# Patient Record
Sex: Male | Born: 1960 | Race: White | Hispanic: No | Marital: Married | Smoking: Never smoker
Health system: Southern US, Community
[De-identification: ages and names within clinical notes are randomized; demographics above are authoritative.]

## PROBLEM LIST (undated history)

## (undated) DIAGNOSIS — K279 Peptic ulcer, site unspecified, unspecified as acute or chronic, without hemorrhage or perforation: Secondary | ICD-10-CM

## (undated) DIAGNOSIS — I2510 Atherosclerotic heart disease of native coronary artery without angina pectoris: Secondary | ICD-10-CM

## (undated) DIAGNOSIS — K219 Gastro-esophageal reflux disease without esophagitis: Secondary | ICD-10-CM

## (undated) DIAGNOSIS — G459 Transient cerebral ischemic attack, unspecified: Secondary | ICD-10-CM

## (undated) DIAGNOSIS — F32A Depression, unspecified: Secondary | ICD-10-CM

## (undated) DIAGNOSIS — F329 Major depressive disorder, single episode, unspecified: Secondary | ICD-10-CM

## (undated) DIAGNOSIS — R7989 Other specified abnormal findings of blood chemistry: Secondary | ICD-10-CM

## (undated) DIAGNOSIS — T7840XA Allergy, unspecified, initial encounter: Secondary | ICD-10-CM

## (undated) DIAGNOSIS — I10 Essential (primary) hypertension: Secondary | ICD-10-CM

## (undated) DIAGNOSIS — Z8719 Personal history of other diseases of the digestive system: Secondary | ICD-10-CM

## (undated) DIAGNOSIS — N200 Calculus of kidney: Secondary | ICD-10-CM

## (undated) DIAGNOSIS — E785 Hyperlipidemia, unspecified: Secondary | ICD-10-CM

## (undated) HISTORY — DX: Allergy, unspecified, initial encounter: T78.40XA

## (undated) HISTORY — DX: Other specified abnormal findings of blood chemistry: R79.89

## (undated) HISTORY — DX: Gastro-esophageal reflux disease without esophagitis: K21.9

## (undated) HISTORY — DX: Atherosclerotic heart disease of native coronary artery without angina pectoris: I25.10

## (undated) HISTORY — DX: Peptic ulcer, site unspecified, unspecified as acute or chronic, without hemorrhage or perforation: K27.9

## (undated) HISTORY — DX: Essential (primary) hypertension: I10

## (undated) HISTORY — PX: WRIST SURGERY: SHX841

## (undated) HISTORY — DX: Personal history of other diseases of the digestive system: Z87.19

## (undated) HISTORY — DX: Depression, unspecified: F32.A

## (undated) HISTORY — DX: Major depressive disorder, single episode, unspecified: F32.9

## (undated) HISTORY — DX: Hyperlipidemia, unspecified: E78.5

## (undated) HISTORY — PX: CHOLECYSTECTOMY: SHX55

## (undated) HISTORY — DX: Calculus of kidney: N20.0

## (undated) HISTORY — DX: Transient cerebral ischemic attack, unspecified: G45.9

## (undated) HISTORY — PX: TONSILLECTOMY AND ADENOIDECTOMY: SUR1326

## (undated) HISTORY — PX: CARDIAC CATHETERIZATION: SHX172

---

## 2006-11-08 ENCOUNTER — Encounter: Admission: RE | Admit: 2006-11-08 | Discharge: 2006-11-08 | Payer: Self-pay | Admitting: Gastroenterology

## 2006-11-08 IMAGING — CT CT ENTERO ABD W/CM
2 of 4 series · 13 of 32 positions shown, 19 images · IV contrast (VOLUMEN & [ID] OMNI 300)
Comparison: None.

CLINICAL DATA: Abdominal pain.  History of small bowel obstruction. 
CT ENTEROGRAPHY OF THE ABDOMEN WITH CONTRAST:
TECHNIQUE: Multidetector CT imaging of the abdomen and pelvis was performed following administration of [2T] cc of VoLumen low attenuation enteric contrast.  Images were acquired after bolus injection of nonionic intravenous contrast with coronal and sagittal reformations reviewed for assessment of the small bowel mucosa. 
Contrast:  100 cc Omnipaque 300

[Series 2: enterography study · axial · 0.78mm/px · z∈[-393,-48]mm · 9 of 120 slices shown, 15 images]
[im 12/120  soft-tissue]
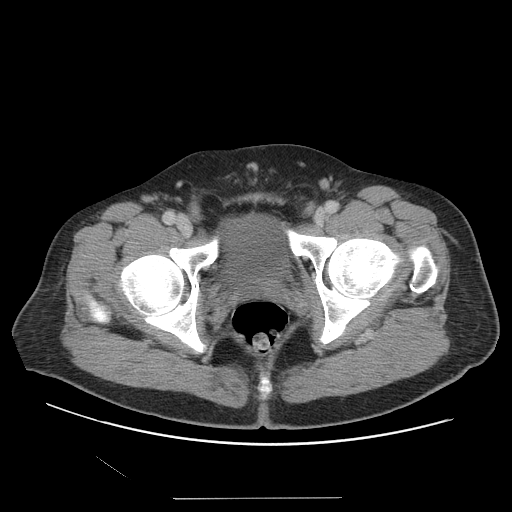
[im 12/120  bone]
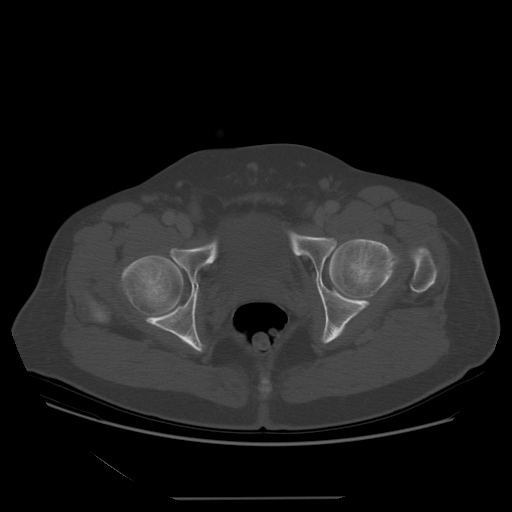
[im 24/120  soft-tissue]
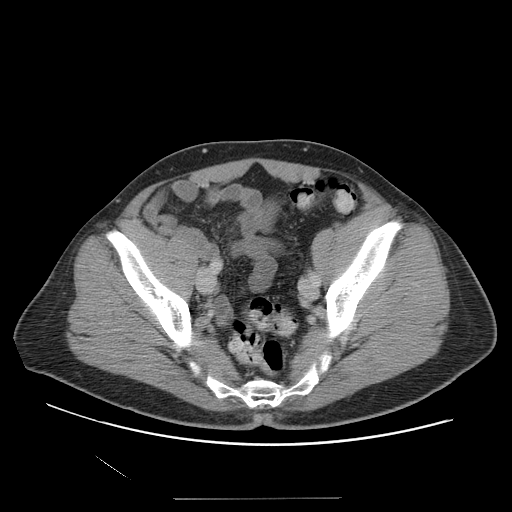
[im 36/120  soft-tissue]
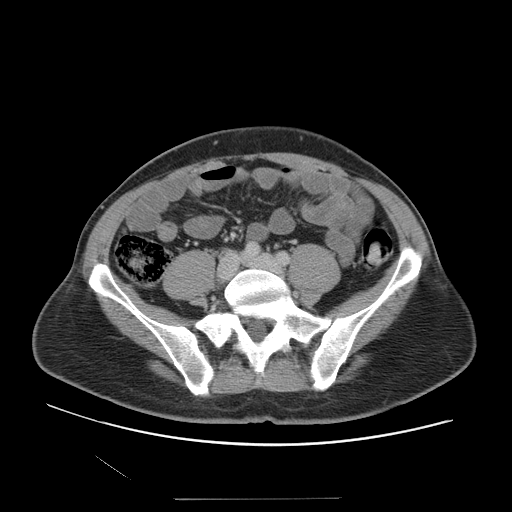
[im 48/120  soft-tissue]
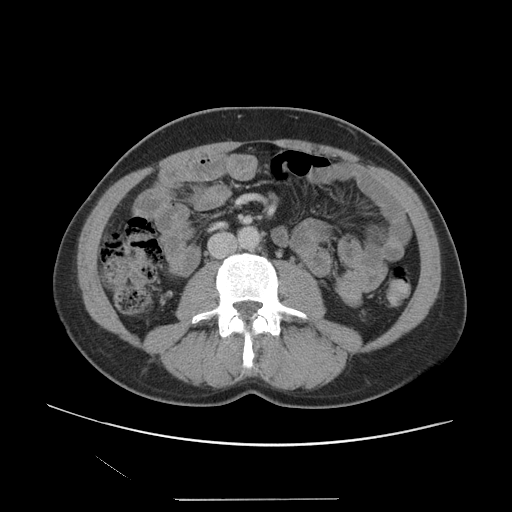
[im 60/120  soft-tissue]
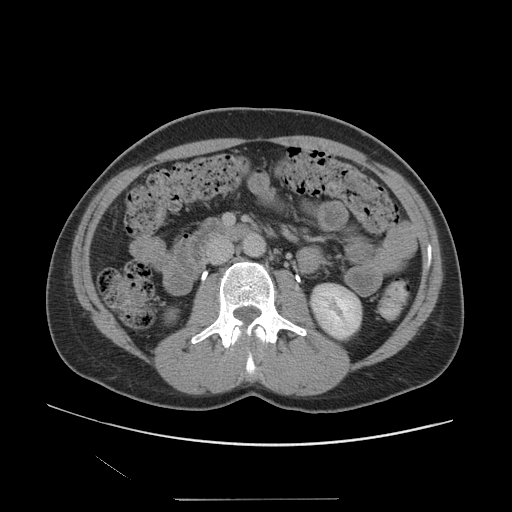
[im 72/120  soft-tissue]
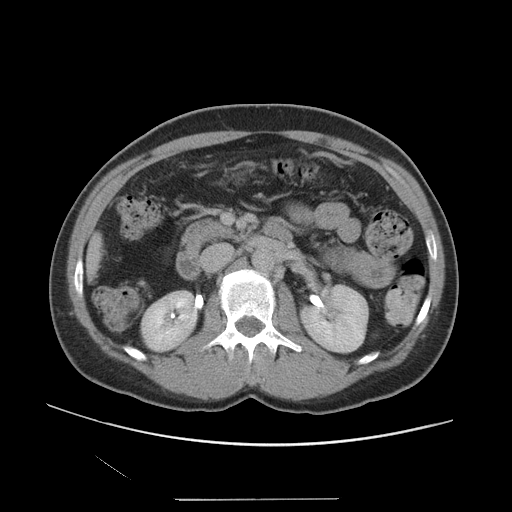
[im 72/120  lung]
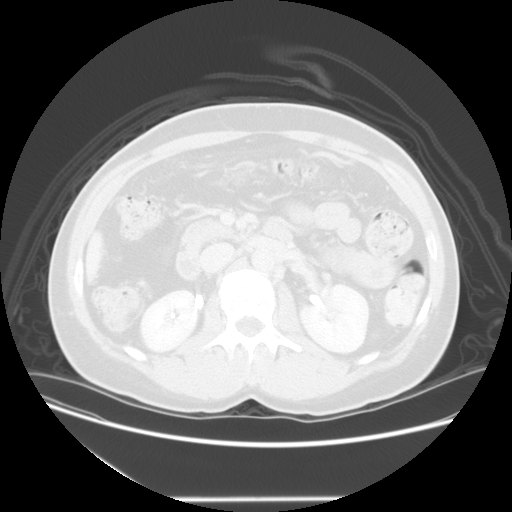
[im 84/120  soft-tissue]
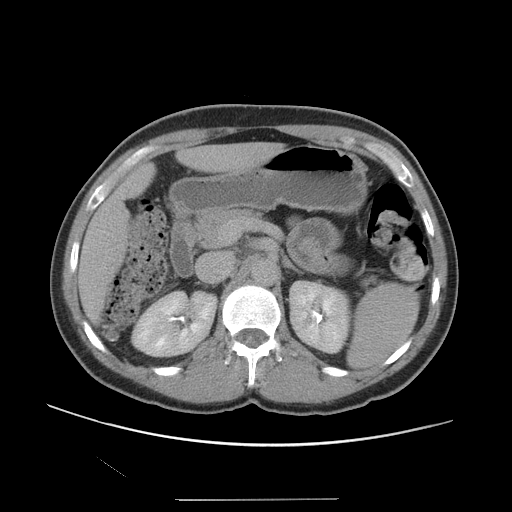
[im 84/120  lung]
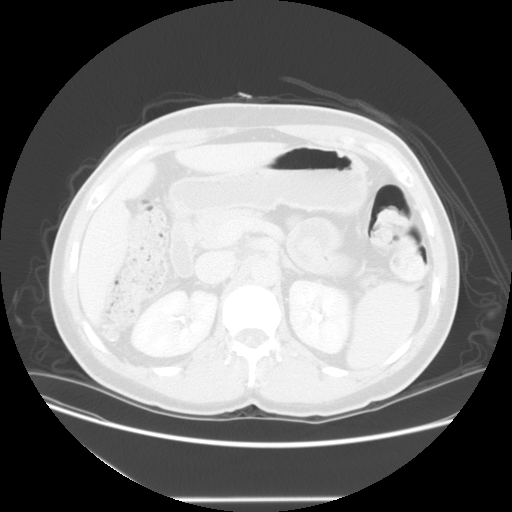
[im 96/120  soft-tissue]
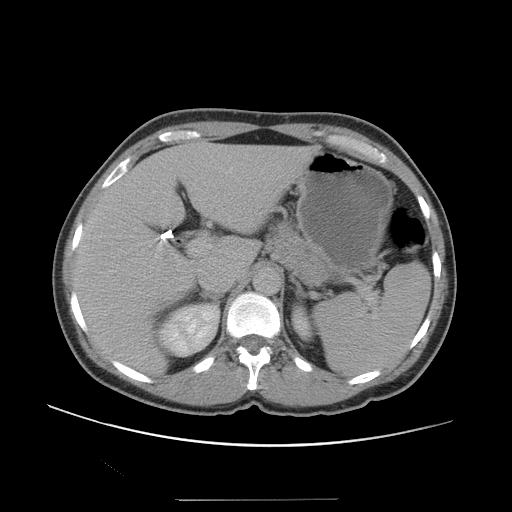
[im 96/120  lung]
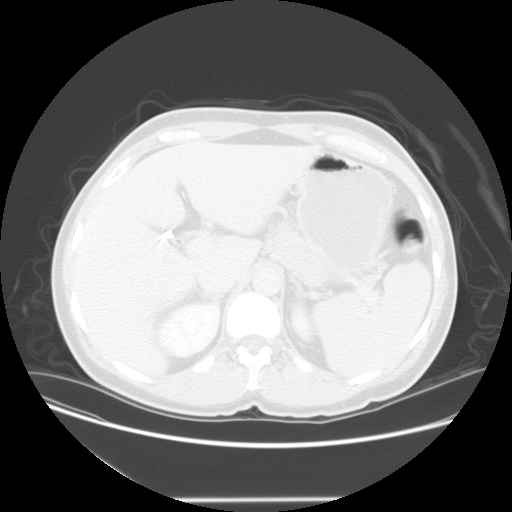
[im 108/120  soft-tissue]
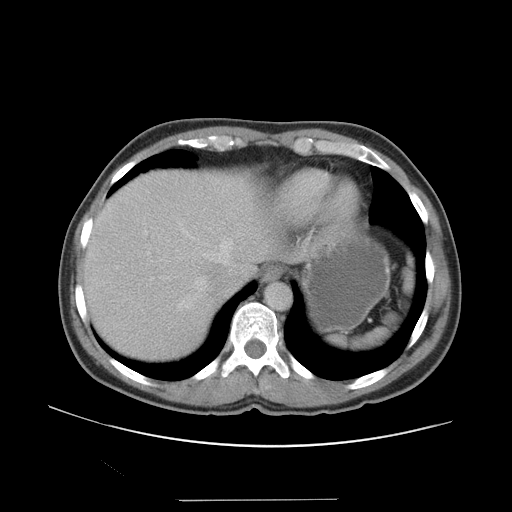
[im 108/120  lung]
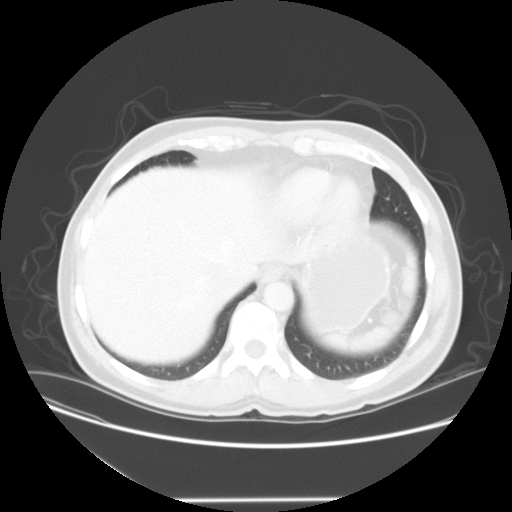
[im 108/120  bone]
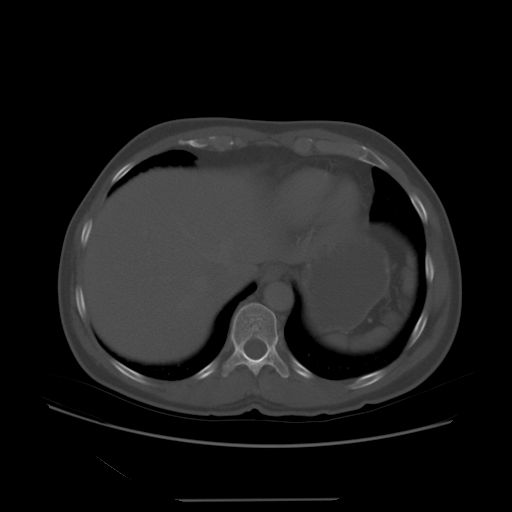

[Series 401: sagittal · sagittal · 0.94mm/px · 4 of 124 slices shown]
[im 13/124  soft-tissue]
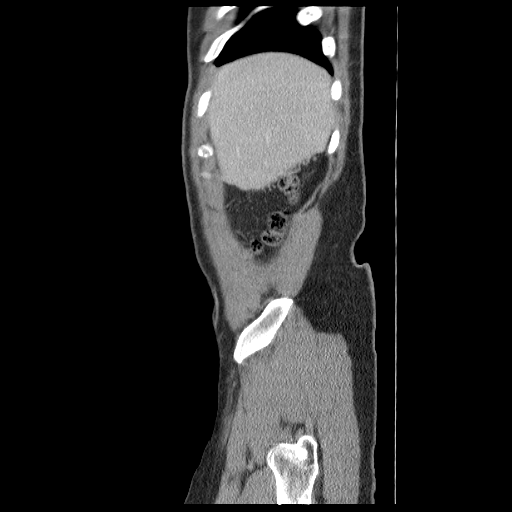
[im 25/124  soft-tissue]
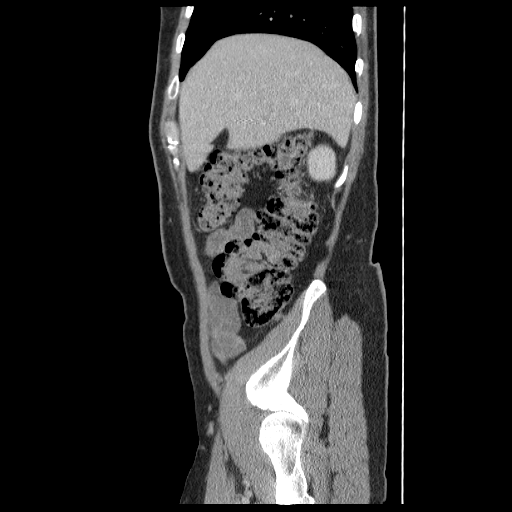
[im 37/124  soft-tissue]
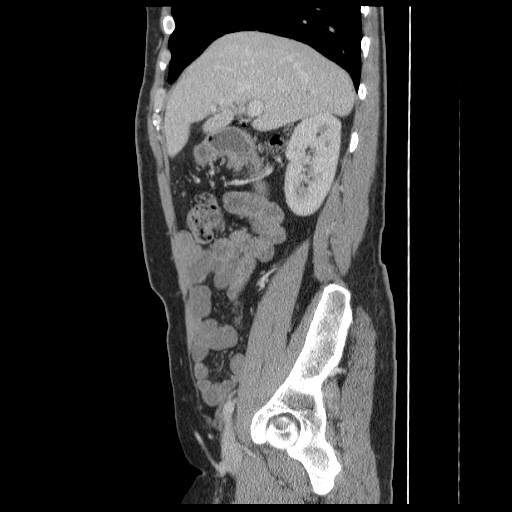
[im 50/124  soft-tissue]
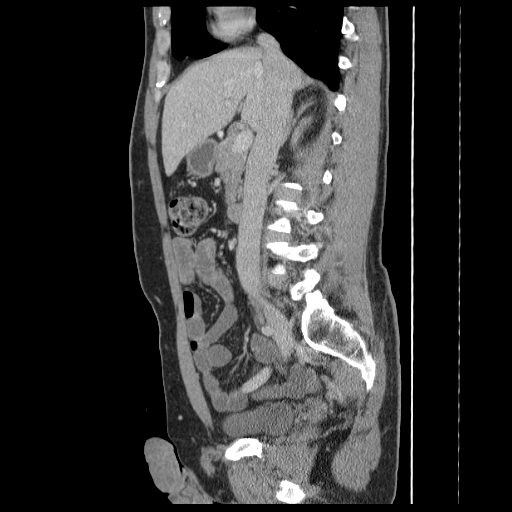

[13 of 32 positions shown; findings below may reference images not displayed]

FINDINGS: The lung bases are clear.    There is no pleural effusion.   The patient is status post cholecystectomy.  There is a small amount of air in the common bile duct, presumably secondary to sphincterotomy.  The liver, spleen, pancreas, adrenal glands and kidneys appear normal.  
The stomach and small bowel are fluid-filled, but only mildly distended.  No bowel lesions are identified.  There is no evidence of bowel obstruction.  No extraluminal fluid collections are seen.  The vascular structures enhance normally.  There is no lymphadenopathy.
IMPRESSION: 1.  Negative CT enterography study.  No evidence of small bowel obstruction. 
2.  Pneumobilia status post cholecystectomy, presumably secondary to previous sphincterotomy.
CT ENTEROGRAPHY OF THE PELVIS WITH CONTRAST:
FINDINGS: No pelvic mass, fluid collection, or inflammatory process is demonstrated.  No bowel abnormalities are identified within the pelvis.  There is stool throughout the colon.
IMPRESSION: No evidence of bowel wall thickening or inflammatory change.

## 2007-01-08 HISTORY — PX: ESOPHAGOGASTRODUODENOSCOPY: SHX1529

## 2012-02-19 DIAGNOSIS — I2510 Atherosclerotic heart disease of native coronary artery without angina pectoris: Secondary | ICD-10-CM

## 2012-02-19 HISTORY — PX: CORONARY ARTERY BYPASS GRAFT: SHX141

## 2012-09-10 HISTORY — PX: COLONOSCOPY: SHX5424

## 2012-09-10 LAB — HM COLONOSCOPY

## 2014-11-09 ENCOUNTER — Ambulatory Visit (INDEPENDENT_AMBULATORY_CARE_PROVIDER_SITE_OTHER): Payer: BLUE CROSS/BLUE SHIELD | Admitting: Cardiology

## 2014-11-09 ENCOUNTER — Encounter: Payer: Self-pay | Admitting: Cardiology

## 2014-11-09 ENCOUNTER — Other Ambulatory Visit: Payer: Self-pay | Admitting: *Deleted

## 2014-11-09 VITALS — BP 128/90 | HR 93 | Ht 70.0 in | Wt 193.0 lb

## 2014-11-09 DIAGNOSIS — I255 Ischemic cardiomyopathy: Secondary | ICD-10-CM

## 2014-11-09 MED ORDER — METOPROLOL SUCCINATE ER 25 MG PO TB24: 25.0000 mg | ORAL_TABLET | Freq: Every day | ORAL | Status: DC

## 2014-11-09 MED ORDER — METOPROLOL SUCCINATE ER 25 MG PO TB24
25.0000 mg | ORAL_TABLET | Freq: Every day | ORAL | Status: DC
Start: 2014-11-09 — End: 2014-11-09

## 2014-11-09 NOTE — Progress Notes (Signed)
HPI The patient presents as a new patient for follow up of CAD.  He has had CABG as below.  He presented with chest pain initially in 2012.  He did not ever have an MI.  Since CABG he has done well.  The patient denies any new symptoms such as chest discomfort, neck or arm discomfort. There has been no new shortness of breath, PND or orthopnea. There have been no reported palpitations, presyncope or syncope.  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  He denies any cardiovascular symptoms with this.   Allergies not on file  Current Outpatient Prescriptions  Medication Sig Dispense Refill  . levofloxacin (LEVAQUIN) 250 MG tablet Take 250 mg by mouth daily.    . tamsulosin (FLOMAX) 0.4 MG CAPS capsule Take 0.4 mg by mouth.    Marland Kitchen amLODipine (NORVASC) 10 MG tablet Take 10 mg by mouth daily.  2  . benazepril (LOTENSIN) 20 MG tablet Take 20 mg by mouth daily.  2  . CRESTOR 10 MG tablet   0  . VICODIN ES 7.5-300 MG TABS   0  . ZENATANE 20 MG capsule   0   No current facility-administered medications for this visit.    Past Medical History  Diagnosis Date  . CAD (coronary artery disease)     Cath 2013 LAD 95% stenosis, D1 90% stenosis, RCA 20%, Circ 30%.  Previous stent to D1 2012.   EF 60%.    Marland Kitchen HTN (hypertension)   . Hyperlipidemia   . Depression   . Low testosterone   . Nephrolithiasis     Past Surgical History  Procedure Laterality Date  . Coronary artery bypass graft  02/19/12    L - LAD, SVG - D1, SVG - OM  . Tonsillectomy and adenoidectomy    . Wrist surgery    . Cholecystectomy      Family History  Problem Relation Age of Onset  . CAD Father 62  . Aneurysm Mother 53  . Heart disease Sister     Unclear    History   Social History  . Marital Status: Married    Spouse Name: N/A    Number of Children: N/A  . Years of Education: N/A   Occupational History  . Not on file.   Social History Main Topics    . Smoking status: Never Smoker   . Smokeless tobacco: Not on file  . Alcohol Use: Not on file  . Drug Use: Not on file  . Sexual Activity: Not on file   Other Topics Concern  . 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      ROS:  As stated in the HPI and negative for all other systems.  PHYSICAL EXAM BP 128/90 mmHg  Pulse 93  Ht  (1.778 m)  Wt 193 lb (87.544 kg)  BMI 27.69 kg/m2  GENERAL:  Well appearing HEENT:  Pupils equal round and reactive, fundi not visualized, oral mucosa unremarkable NECK:  No jugular venous distention, waveform within normal limits, carotid upstroke brisk and symmetric, no bruits, no thyromegaly LYMPHATICS:  No cervical, inguinal adenopathy LUNGS:  Clear to auscultation bilaterally BACK:  No CVA tenderness CHEST:  Well healed sternotomy scar. HEART:  PMI not displaced or sustained,S1 and S2 fixed and split, no S3, no  S4, no clicks, no rubs, no murmurs ABD:  Flat, positive bowel sounds normal in frequency in pitch, no bruits, no rebound, no guarding, no midline pulsatile mass, no hepatomegaly, no splenomegaly EXT:  2 plus pulses throughout, no edema, no cyanosis no clubbing SKIN:  No rashes no nodules NEURO:  Cranial nerves II through XII grossly intact, motor grossly intact throughout PSYCH:  Cognitively intact, oriented to person place and time   EKG:  EKG, RATE 93, axis WNL, intervals WNL, RBBB, no acute ST T wave changes.    ASSESSMENT AND PLAN  CAD:  I would like to start a low dose of beta blocker.  He might have had some intolerance to this in the past but he is willing to try again.  Otherwise he will continue with risk reduction.  He will start to exercise more.  I told his wife to increase to Toprol to 50 mg in about one month if he is tolerating this.   DYSLIPIDEMIA:  His lipids are  LDL 62, HDL 37.  He will continue the meds as listed  HTN:  This is being  controlled on the meds as listed.  I will wean down the Norvasc if his BP allows.

## 2014-11-09 NOTE — Patient Instructions (Signed)
Your physician recommends that you schedule a follow-up appointment in: 3 months with Dr. Antoine PocheHochrein  WE are starting Toprol XL 25 mg one daily

## 2015-02-08 ENCOUNTER — Ambulatory Visit (INDEPENDENT_AMBULATORY_CARE_PROVIDER_SITE_OTHER): Payer: BLUE CROSS/BLUE SHIELD | Admitting: Cardiology

## 2015-02-08 ENCOUNTER — Encounter: Payer: Self-pay | Admitting: Cardiology

## 2015-02-08 VITALS — BP 132/96 | HR 96 | Ht 70.0 in | Wt 200.7 lb

## 2015-02-08 DIAGNOSIS — I2510 Atherosclerotic heart disease of native coronary artery without angina pectoris: Secondary | ICD-10-CM | POA: Diagnosis not present

## 2015-02-08 NOTE — Progress Notes (Signed)
HPI The patient presents for follow up of CAD.  He has had CABG as below.  He presented with chest pain initially in 2012.  He did not ever have an MI.  Since CABG he has done well.  This is his second visit.  The patient denies any new symptoms such as chest discomfort, neck or arm discomfort. There has been no new shortness of breath, PND or orthopnea. There have been no reported palpitations, presyncope or syncope.  He is not exercising as much as he should.  He did go on a cruise recently and gained weight.  Of note he did not do well with up titration of his beta blocker to 100 mg. He also had his Norvasc increased. Because of headaches he was started back on a higher dose of Norvasc and his beta blocker was reduced. His diastolic blood pressure has been slightly elevated but he's also gained 5 pounds.   Allergies  Allergen Reactions  . Penicillins Rash    Current Outpatient Prescriptions  Medication Sig Dispense Refill  . amLODipine (NORVASC) 10 MG tablet Take 10 mg by mouth daily.  2  . aspirin 81 MG tablet Take 81 mg by mouth daily.    . benazepril (LOTENSIN) 40 MG tablet Take 40 mg by mouth daily.    . cloNIDine (CATAPRES) 0.1 MG tablet Take 0.1 mg by mouth as needed.  1  . CRESTOR 10 MG tablet   0  . DULoxetine (CYMBALTA) 60 MG capsule Take 60 mg by mouth daily.    Marland Kitchen. LORazepam (ATIVAN) 1 MG tablet Take 1 mg by mouth every 8 (eight) hours.    . metoprolol succinate (TOPROL-XL) 50 MG 24 hr tablet Take 50 mg by mouth daily. Take with or immediately following a meal.    . nitroGLYCERIN (NITROSTAT) 0.4 MG SL tablet Place 0.4 mg under the tongue every 5 (five) minutes as needed for chest pain.    Marland Kitchen. testosterone cypionate (DEPOTESTOTERONE CYPIONATE) 200 MG/ML injection every 14 (fourteen) days.  2  . topiramate (TOPAMAX) 50 MG tablet Take 50 mg by mouth 2 (two) times daily.     No current facility-administered medications for this visit.    Past Medical History  Diagnosis Date  .  CAD (coronary artery disease)     Cath 2013 LAD 95% stenosis, D1 90% stenosis, RCA 20%, Circ 30%.  Previous stent to D1 2012.   EF 60%.    Marland Kitchen. HTN (hypertension)   . Hyperlipidemia   . Depression   . Low testosterone   . Nephrolithiasis   . PUD (peptic ulcer disease)     Past Surgical History  Procedure Laterality Date  . Coronary artery bypass graft  02/19/12    L - LAD, SVG - D1, SVG - OM  . Tonsillectomy and adenoidectomy    . Wrist surgery    . Cholecystectomy      Family History  Problem Relation Age of Onset  . CAD Father 1450  . Aneurysm Mother [id number]  . Heart disease Sister     Unclear    History   Social History  . Marital Status: Married    Spouse Name: N/A  . Number of Children: N/A  . Years of Education: N/A   Occupational History  . Not on file.   Social History Main Topics  . Smoking status: Never Smoker   . Smokeless tobacco: Not on file  . Alcohol Use: Not on file  . Drug Use: Not on  file  . Sexual Activity: Not on file   Other Topics Concern  . 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      ROS:  As stated in the HPI and negative for all other systems.  PHYSICAL EXAM BP 132/96 mmHg  Pulse 96  Ht  (1.778 m)  Wt 200 lb 11.2 oz (91.037 kg)  BMI 28.80 kg/m2  GENERAL:  Well appearing HEENT:  Pupils equal round and reactive, fundi not visualized, oral mucosa unremarkable NECK:  No jugular venous distention, waveform within normal limits, carotid upstroke brisk and symmetric, no bruits, no thyromegaly LYMPHATICS:  No cervical, inguinal adenopathy LUNGS:  Clear to auscultation bilaterally BACK:  No CVA tenderness CHEST:  Well healed sternotomy scar. HEART:  PMI not displaced or sustained,S1 and S2 fixed and split, no S3, no S4, no clicks, no rubs, no murmurs ABD:  Flat, positive bowel sounds normal in frequency in pitch, no bruits, no rebound, no guarding, no midline  pulsatile mass, no hepatomegaly, no splenomegaly EXT:  2 plus pulses throughout, no edema, no cyanosis no clubbing SKIN:  No rashes no nodules NEURO:  Cranial nerves II through XII grossly intact, motor grossly intact throughout PSYCH:  Cognitively intact, oriented to person place and time   EKG:  EKG, RATE 68, axis WNL, intervals WNL, RBBB, no acute ST T wave changes.    02/09/2015   ASSESSMENT AND PLAN  CAD:  He seems to be tolerating low-dose beta blocker. His meds are otherwise adjusted. No change in therapy is indicated. We did discuss exercise as this is not yet at target.  DYSLIPIDEMIA:  His lipids have been at target and he will continue the meds as listed.  HTN:  He is on a reasonable regimen at this point.  He will continue with these meds as they were recently adjusted.

## 2015-02-08 NOTE — Patient Instructions (Signed)
Dr Hochrein wants you to follow-up in 6 months. You will receive a reminder letter in the mail two months in advance. If you don't receive a letter, please call our office to schedule the follow-up appointment. 

## 2016-05-22 DIAGNOSIS — E7800 Pure hypercholesterolemia, unspecified: Secondary | ICD-10-CM | POA: Insufficient documentation

## 2016-05-22 DIAGNOSIS — E291 Testicular hypofunction: Secondary | ICD-10-CM | POA: Insufficient documentation

## 2016-05-22 DIAGNOSIS — I25709 Atherosclerosis of coronary artery bypass graft(s), unspecified, with unspecified angina pectoris: Secondary | ICD-10-CM

## 2016-05-22 DIAGNOSIS — F23 Brief psychotic disorder: Secondary | ICD-10-CM | POA: Insufficient documentation

## 2016-05-22 DIAGNOSIS — Z0000 Encounter for general adult medical examination without abnormal findings: Secondary | ICD-10-CM | POA: Insufficient documentation

## 2016-05-22 DIAGNOSIS — I10 Essential (primary) hypertension: Secondary | ICD-10-CM

## 2016-05-22 DIAGNOSIS — F411 Generalized anxiety disorder: Secondary | ICD-10-CM

## 2016-05-22 DIAGNOSIS — I2510 Atherosclerotic heart disease of native coronary artery without angina pectoris: Secondary | ICD-10-CM | POA: Insufficient documentation

## 2016-05-22 DIAGNOSIS — Z125 Encounter for screening for malignant neoplasm of prostate: Secondary | ICD-10-CM

## 2016-05-22 HISTORY — DX: Encounter for screening for malignant neoplasm of prostate: Z12.5

## 2016-05-22 HISTORY — DX: Generalized anxiety disorder: F41.1

## 2016-05-22 HISTORY — DX: Atherosclerosis of coronary artery bypass graft(s), unspecified, with unspecified angina pectoris: I25.709

## 2016-05-22 HISTORY — DX: Pure hypercholesterolemia, unspecified: E78.00

## 2016-05-22 HISTORY — DX: Brief psychotic disorder: F23

## 2016-05-22 HISTORY — DX: Testicular hypofunction: E29.1

## 2016-05-22 HISTORY — DX: Essential (primary) hypertension: I10

## 2016-09-11 ENCOUNTER — Encounter: Payer: Self-pay | Admitting: Neurology

## 2016-09-11 ENCOUNTER — Ambulatory Visit (INDEPENDENT_AMBULATORY_CARE_PROVIDER_SITE_OTHER): Payer: BLUE CROSS/BLUE SHIELD | Admitting: Neurology

## 2016-09-11 VITALS — BP 119/81 | HR 70 | Resp 20 | Ht 70.0 in | Wt 221.0 lb

## 2016-09-11 DIAGNOSIS — R0683 Snoring: Secondary | ICD-10-CM | POA: Diagnosis not present

## 2016-09-11 DIAGNOSIS — G4761 Periodic limb movement disorder: Secondary | ICD-10-CM | POA: Diagnosis not present

## 2016-09-11 DIAGNOSIS — G4733 Obstructive sleep apnea (adult) (pediatric): Secondary | ICD-10-CM | POA: Insufficient documentation

## 2016-09-11 DIAGNOSIS — I2510 Atherosclerotic heart disease of native coronary artery without angina pectoris: Secondary | ICD-10-CM | POA: Diagnosis not present

## 2016-09-11 DIAGNOSIS — G4751 Confusional arousals: Secondary | ICD-10-CM | POA: Insufficient documentation

## 2016-09-11 DIAGNOSIS — F329 Major depressive disorder, single episode, unspecified: Secondary | ICD-10-CM | POA: Insufficient documentation

## 2016-09-11 DIAGNOSIS — E66812 Obesity, class 2: Secondary | ICD-10-CM

## 2016-09-11 DIAGNOSIS — E6609 Other obesity due to excess calories: Secondary | ICD-10-CM

## 2016-09-11 DIAGNOSIS — F332 Major depressive disorder, recurrent severe without psychotic features: Secondary | ICD-10-CM | POA: Diagnosis not present

## 2016-09-11 DIAGNOSIS — G4719 Other hypersomnia: Secondary | ICD-10-CM | POA: Diagnosis not present

## 2016-09-11 HISTORY — DX: Confusional arousals: G47.51

## 2016-09-11 HISTORY — DX: Obstructive sleep apnea (adult) (pediatric): G47.33

## 2016-09-11 HISTORY — DX: Major depressive disorder, recurrent severe without psychotic features: F33.2

## 2016-09-11 HISTORY — DX: Snoring: R06.83

## 2016-09-11 HISTORY — DX: Periodic limb movement disorder: G47.61

## 2016-09-11 HISTORY — DX: Other hypersomnia: G47.19

## 2016-09-11 HISTORY — DX: Obesity, class 2: E66.812

## 2016-09-11 HISTORY — DX: Other obesity due to excess calories: E66.09

## 2016-09-11 HISTORY — DX: Major depressive disorder, single episode, unspecified: F32.9

## 2016-09-11 NOTE — Progress Notes (Signed)
SLEEP MED[facility]INE CLINIC   Provider:  Larey Seat, M D  Referring Provider: Dan Humphreys, NP Primary Care Physician:  Chong Sicilian  Chief Complaint  Patient presents with  . New Patient (Initial Visit)    snores, never had sleep study    HPI:  Jon Gonzales is a 55 y.o. male , seen here as a referral from NP. 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  Jon Gonzales is a documented history of hypertension, is taking regular medication and was able to control this condition, hyperlipidemia, hypercholesterolemia, had a recurrent episode of major depression in the past, has some residual anxiety, his diagnosis was coronary artery disease but has no history of myocardial infarction, his last stress test was in 2014. He has been supplemented for testosterone and vit d deficiency, complains of fatigue, insomnia and restless legs. He has had bouts of insomnia in the past and recurrent insomnia earlier this year was seen last on 06/20/2016 by Darrol Jump and addressed this problem.  Chief complaint according to patient : " wife reports that I snore over the last 6 month, I am more hoarse and congested "  " I am a mouth breather"   Sleep habits are as follows: Jon Gonzales reports that he goes to bed very early. He actually struggles to stay awake in the evening hours and he feels excessive fatigue after a day of work. He will usually is and that already between 8 and 9 PM and goes to sleep. He prefers a prone sleep position. The bedroom is cool, quiet and dark. He is using a white noise machine for background. While he can initiate sleep easy, he reports trouble to stay asleep.  His wife has noted him to snore. He usually wakes up within 2 hours, and has great difficulties after interruption of sleep to reinitiate sleep. He will have usually a second break between 3 and 4 AM with the same problem. He wakes because he has the urge to  urinate 2-3 times at night. He has attributed this to a prostate enlargement.  The couple usually rises at 7 AM and by this time he may have gotten 7 hours of sleep, but spent about 11 hours in the bedroom. He does not feel rested and refreshed or restored in the morning and is hard for him to start the day. He relies on an alarm does not wake up spontaneously and often hits the snooze button. 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 He dropped the 57 year old step-daughter at school in the morning. He drinks a can of Diet Coke in the morning. His office has access to natural daylight, has a window. He feels usually very tired and sleepy after lunch, no matter how extensive the meal is.    Sleep medical history and family sleep history:  The patient is status post cholecystectomy and tonsillectomy and adenoidectomy and young adulthood, coronary bypass surgery in May 2013, history of peptic ulcer disease diagnosed in spring 2008 by  EGD, currently controlled.  Social history: Jon Gonzales is married with 2 stepchildren 73 and 31 years of age. He is a nonsmoker, he drinks caffeine in form of soda one can in the morning, no further beverages in the afternoon. He drinks in social situations alcohol, but not regular and not daily. 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  Review of Systems: Out of a complete 14 system review, the patient complains of only the following symptoms, and all other reviewed systems are negative. The patient endorsed daytime sleepiness snoring at night, depression, feeling nervous and anxious especially when having trouble to go back to sleep, had in the past sometimes suicidal thoughts after a relapse into major depression. Headaches, environmental allergy, weight gain. EDS.   Epworth score 19 , Fatigue severity score 62 , depression score 7 /15.  How likely are you to doze in the following situations: 0 = not  likely, 1 = slight chance, 2 = moderate chance, 3 = high chance  Sitting and Reading? 3 Watching Television?3 Sitting inactive in a public place (theater or meeting)?3 Lying down in the afternoon when circumstances permit?2 Sitting and talking to someone?3 Sitting quietly after lunch without alcohol?1 In a car, while stopped for a few minutes in traffic?3 As a passenger in a car for an hour without a break?1  Total = 19    Have opportunity to review the patient's last lab results:  CBC with differential obtained on 07/12/2016, lipid panel same day revealing a mild elevation in triglycerides, testosterone was 159 ng and is considered low, TSH was 2.98 in normal limits, liver function tests were not elevated, prostate-specific antigen 1.3 normal lead level II mcg/dL normal this was from August 2017 Social History   Social History  . Marital status: Married    Spouse name: N/A  . Number of children: N/A  . Years of education: N/A   Occupational History  . Not on file.   Social History Main Topics  . Smoking status: Never Smoker  . Smokeless tobacco: Not on file  . Alcohol use Not on file  . Drug use: Unknown  . Sexual activity: Not on file   Other Topics Concern  . 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      Family History  Problem Relation Age of Onset  . CAD Father 50  . Aneurysm Mother 46  . Heart disease Sister     Unclear    Past Medical History:  Diagnosis Date  . CAD (coronary artery disease)    Cath 2013 LAD 95% stenosis, D1 90% stenosis, RCA 20%, Circ 30%.  Previous stent to D1 2012.   EF 60%.    . Depression   . HTN (hypertension)   . Hyperlipidemia   . Low testosterone   . Nephrolithiasis   . PUD (peptic ulcer disease)     Past Surgical History:  Procedure Laterality Date  . CHOLECYSTECTOMY    . CORONARY ARTERY BYPASS GRAFT  02/19/12   L - LAD, SVG - D1, SVG - OM  .  TONSILLECTOMY AND ADENOIDECTOMY    . WRIST SURGERY      Current Outpatient Prescriptions  Medication Sig Dispense Refill  . amLODipine (NORVASC) 10 MG tablet Take 10 mg by mouth daily.  2  . ARIPiprazole (ABILIFY) 10 MG tablet Take 10 mg by mouth daily.  0  . aspirin 81 MG tablet Take 81 mg by mouth daily.    . benazepril (LOTENSIN) 40 MG tablet Take 40 mg by mouth daily.    . cloNIDine (CATAPRES) 0.1 MG tablet Take 0.1 mg by mouth as needed.  1  . CRESTOR 10 MG tablet   0  . fexofenadine (ALLEGRA) 30 MG tablet   Take 30 mg by mouth daily as needed.    . KRILL OIL PO Take by mouth.    . LORazepam (ATIVAN) 1 MG tablet Take 1 mg by mouth every 8 (eight) hours.    . metoprolol succinate (TOPROL-XL) 50 MG 24 hr tablet Take 50 mg by mouth daily. Take with or immediately following a meal.    . Multiple Vitamin (MULTI-VITAMINS) TABS Take by mouth.    . nitroGLYCERIN (NITROSTAT) 0.4 MG SL tablet Place 0.4 mg under the tongue every 5 (five) minutes as needed for chest pain.    . testosterone cypionate (DEPOTESTOTERONE CYPIONATE) 200 MG/ML injection every 14 (fourteen) days.  2  . topiramate (TOPAMAX) 50 MG tablet Take 50 mg by mouth 2 (two) times daily.     No current facility-administered medications for this visit.     Allergies as of 09/11/2016 - Review Complete 09/11/2016  Allergen Reaction Noted  . Penicillins Rash 11/09/2014    Vitals: BP 119/81   Pulse 70   Resp 20   Ht 5' 10" (1.778 m)   Wt 221 lb (100.2 kg)   BMI 31.71 kg/m  Last Weight:  Wt Readings from Last 1 Encounters:  09/11/16 221 lb (100.2 kg)   BMI:Body mass index is 31.71 kg/m.     Last Height:   Ht Readings from Last 1 Encounters:  09/11/16 5' 10" (1.778 m)    Physical exam:  General: The patient is awake, alert and appears not in acute distress. The patient is well groomed. Head: Normocephalic, atraumatic. Neck is supple. Mallampati 2-3  neck circumference:17. Nasal airflow congested , TMJ not evident .  Retrognathia is seen, crowded lower jaw, overbite. .  Cardiovascular:  Regular rate and rhythm , without  murmurs or carotid bruit, and without distended neck veins. Respiratory: Lungs are clear to auscultation. Skin:  Without evidence of edema, or rash Trunk: BMI is 32. The patient's posture is erect   Neurologic exam : The patient is awake and alert, oriented to place and time.   Memory subjective described as intact.   Attention span & concentration ability appears normal.  Speech is fluent,  without dysarthria, has dysphonia not aphasia.  Mood and affect are appropriate.  Cranial nerves: Pupils are equal and briskly reactive to light. Funduscopic exam without evidence of pallor or edema.  Extraocular movements  in vertical and horizontal planes intact and without nystagmus. Visual fields by finger perimetry are intact. Hearing to finger rub intact.  Facial sensation intact to fine touch. Facial motor strength is symmetric and tongue and uvula move midline. Shoulder shrug was symmetrical.   Motor exam:  Normal tone, muscle bulk and symmetric strength in all extremities.  Sensory:  Fine touch, pinprick and vibration were tested in all extremities.  Proprioception tested in the upper extremities was normal.  Coordination:  Finger-to-nose maneuver  normal without evidence of ataxia, dysmetria or tremor.  Gait and station: Patient walks without assistive device and is able unassisted to climb up to the exam table. Strength within normal limits.  Stance is stable and normal.Tandem gait is unfragmented.  Deep tendon reflexes: in the  upper and lower extremities are symmetric and intact. Babinski maneuver response is downgoing.  The patient was advised of the nature of the diagnosed sleep disorder , the treatment options and risks for general a health and wellness arising from not treating the condition.  I spent more than 45 minutes of face to face time with the patient. Greater than 50%    of time was spent in counseling and coordination of care. We have discussed the diagnosis and differential and I answered the patient's questions.     Assessment:  After physical and neurologic examination, review of laboratory studies,  Personal review of imaging studies, reports of other /same  Imaging studies ,  Results of polysomnography/ neurophysiology testing and pre-existing records as far as provided in visit., my assessment is   1) Patient presents with snoring, has had witnessed apnea, and presents with risk factors of elevated body mass index, nasal congestion, retrognathia, neck circumference. Is a history of coronary artery disease at a rather young age, it is at a higher risk for developing atrial fibrillation should he have untreated sleep apnea. He probably suffers from seasonal allergies, he uses a nasal rinse almost daily. The witnessed snoring in spite of his tendency to sleep prone is an indicator that he is at higher risk of sleep apnea.  2) Mr. Yoon definitely endorses a very high degree of fatigue and an Epworth sleepiness score that is frequently associated with narcolepsy. He reports an irresistible urge to want to go to sleep after lunchtime and in the afternoon hours. He takes power naps and they feel refreshing he often feels more refreshed after a night of sleep. His sleep is fragmented as described above. For this reason I will not just evaluate him for apnea but also for possible narcolepsy. He does not recall having vivid dreams but she is on medication that could suppress REM sleep stages  3) major depression, testosterone deficiency, can all contribute to a higher degree of fatigue. He has received supplemental testosterone but it has not lifted his fatigue.   4) the patient is on medication that will alter his sleep architecture including a benzodiazepine, Abilify, and antidepressants.  Plan:  Treatment plan and additional workup : Attended sleep study, split if AHI  exceeds 15, 3% desaturation of oxygen. If apnea is not identified but hypoxemia I would like to titrate the patient to oxygen.  If no organic sleep disorder is identified, I would consider working him up for narcolepsy. Based on the antidepressants he is currently taking he would not have a valid MS LT, but we could consider an HLA narcolepsy test. I will meet with him after his sleep study, we will try to get the sleep study done before year's end.      MD  09/11/2016   CC: Leslie C Gonzales [address] [place], [place] [address]          

## 2016-09-24 ENCOUNTER — Telehealth: Payer: Self-pay

## 2016-09-24 NOTE — Telephone Encounter (Signed)
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 Patient voiced understanding and he has been reminded of up coming sleep study.

## 2016-10-16 ENCOUNTER — Ambulatory Visit (INDEPENDENT_AMBULATORY_CARE_PROVIDER_SITE_OTHER): Payer: BLUE CROSS/BLUE SHIELD | Admitting: Neurology

## 2016-10-16 DIAGNOSIS — G4751 Confusional arousals: Secondary | ICD-10-CM

## 2016-10-16 DIAGNOSIS — G4719 Other hypersomnia: Secondary | ICD-10-CM

## 2016-10-16 DIAGNOSIS — E6609 Other obesity due to excess calories: Secondary | ICD-10-CM

## 2016-10-16 DIAGNOSIS — G4733 Obstructive sleep apnea (adult) (pediatric): Secondary | ICD-10-CM | POA: Diagnosis not present

## 2016-10-16 DIAGNOSIS — R0683 Snoring: Secondary | ICD-10-CM

## 2016-10-16 DIAGNOSIS — G4761 Periodic limb movement disorder: Secondary | ICD-10-CM

## 2016-10-16 DIAGNOSIS — I2510 Atherosclerotic heart disease of native coronary artery without angina pectoris: Secondary | ICD-10-CM

## 2016-10-16 DIAGNOSIS — F332 Major depressive disorder, recurrent severe without psychotic features: Secondary | ICD-10-CM

## 2016-10-26 ENCOUNTER — Telehealth: Payer: Self-pay | Admitting: Neurology

## 2016-10-26 DIAGNOSIS — G4710 Hypersomnia, unspecified: Secondary | ICD-10-CM

## 2016-10-26 DIAGNOSIS — G4719 Other hypersomnia: Secondary | ICD-10-CM

## 2016-10-26 DIAGNOSIS — G4730 Sleep apnea, unspecified: Secondary | ICD-10-CM

## 2016-10-26 DIAGNOSIS — G4733 Obstructive sleep apnea (adult) (pediatric): Secondary | ICD-10-CM

## 2016-10-26 NOTE — Procedures (Signed)
PATIENT'S NAMJacklynn Gonzales:  Homan, Warnell D. DOB:      [birth year]      MR#:    [id number]     DATE OF RECORDING: 10/16/2016 REFERRING M.D.:  Lauris PoagLeslie O'Neal, NP Study Performed:   Baseline Polysomnogram HISTORY:  Jon GanongWilliam D Rostron is a 56 y.o. male, seen here as a referral from Mickey FarberKristen Kocak, MD  for a sleep consultation. The patient is excessively daytime sleepy. Hx of CAD, ulcer, Nocturia 2-3 times at night.   The patient endorsed the Epworth Sleepiness Scale at 19- points.  FSS at 62 points and Depression score at 7/15 points.  The patient's weight 221 pounds with a height of 70 (inches), resulting in a BMI of 31.71 kg/m2. The patient's neck circumference measured 17 inches.  CURRENT MEDICATIONS: Norvasc, Abilify, Lotensin, Catapres, Crestor, Allegra, Krill Oil, Ativan, Toprol XL, Multi, Nitrostat, Testosterone injection, Topamax   PROCEDURE:  This is a multichannel digital polysomnogram utilizing the Somnostar 11.2 system.  Electrodes and sensors were applied and monitored per AASM Specifications.   EEG, EOG, Chin and Limb EMG, were sampled at 200 Hz.  ECG, Snore and Nasal Pressure, Thermal Airflow, Respiratory Effort, CPAP Flow and Pressure, Oximetry was sampled at 50 Hz. Digital video and audio were recorded.      BASELINE STUDY: Lights Out was at 20:52 and Lights On at 05:32.  Total recording time (TRT) was 520.5 minutes, with a total sleep time (TST) of 468 minutes.  The patient's sleep latency was 19 minutes.  REM latency was 104 minutes.  The sleep efficiency was 89.9 %.     SLEEP ARCHITECTURE: WASO (Wake after sleep onset) was 33.5 minutes.  There were 14 minutes in Stage N1, 284.5 minutes Stage N2, 88.5 minutes Stage N3 and 81 minutes in Stage REM.  The percentage of Stage N1 was 3.%, Stage N2 was 60.8%, Stage N3 was 18.9% and Stage R (REM sleep) was 17.3%.   RESPIRATORY ANALYSIS:  There were a total of 52 respiratory events:  8 obstructive apneas, 44 hypopneas with 0 respiratory event related arousals  (RERAs).     The total APNEA/HYPOPNEA INDEX (AHI) was 6.7/hour and the total RESPIRATORY DISTURBANCE INDEX was 6.7 /hour.  16 events occurred in REM sleep and 62 events in NREM. The REM AHI was 11.9 /hour, versus a non-REM AHI of 5.6. The patient spent 107.5 minutes of total sleep time in the supine position and 361 minutes in non-supine. The supine AHI was 16.7 versus a non-supine AHI of 3.7.  OXYGEN SATURATION & C02:  The Wake baseline 02 saturation was 94%, with the lowest being 82%. Time spent below 89% saturation equaled 7 minutes.   PERIODIC LIMB MOVEMENTS:   The patient had a total of 5 Periodic Limb Movements.  The Periodic Limb Movement (PLM) index was 0.6 and the PLM Arousal index was 0.0/hour.   IMPRESSION:  1. Mild Obstructive Sleep Apnea(OSA) REM sleep and supine sleep position dependent. 2. Primary Snoring, moderate 3. No parasomnia activity.   RECOMMENDATIONS:  Given the high degree of daytime sleepiness and fatigue, I would consider Mr. Sedalia MutaCox a candidate for a CPAP therapy. If his sleepiness is not improved after 30-60 days of CPAP treatment, an evaluation for narcolepsy will follow. I will order a CPAP titration study.   1. Avoid supine sleep if possible, consider using a tennis ball.  2. Avoid sedative-hypnotics which may worsen sleep apnea, alcohol and tobacco (as applicable). 3. Advise to lose weight, diet and exercise if not contraindicated (BMI  32). 4. Dental device or ENT procedures can improve mild apnea and snoring- as clinically indicated . 5. Further information regarding OSA may be obtained from [employer] (www.sleepfoundation.org) or American Sleep Apnea Association (www.sleepapnea.org). 6. A follow up appointment will be scheduled in the Sleep Clinic at [facility]. The referring provider will be notified of the results.      I certify that I have reviewed the entire raw data recording prior to the issuance of this report in  accordance with the Standards of Accreditation of the American Academy of Sleep Medicine (AASM)      Melvyn Novas, MD  10-26-2016  Diplomat, American Board of Psychiatry and Neurology  Diplomat, American Board of Sleep Medicine Medical Director, [facility] [employer]

## 2016-10-26 NOTE — Telephone Encounter (Signed)
IMPRESSION:  1. Mild Obstructive Sleep Apnea(OSA) AHI 6.7/hr. , REM sleep ( AHI 11.9) and supine sleep position ( AHI 16.7) dependent. 2. Primary Snoring, moderate 3. No parasomnia activity.   RECOMMENDATIONS:  Given the high degree of daytime sleepiness and fatigue, I would consider Jon Gonzales a candidate for a CPAP therapy. If his sleepiness is not improved after 30-60 days of CPAP treatment, an evaluation for narcolepsy will follow. I will order a CPAP titration study.   1. Avoid supine sleep if possible, consider using a tennis ball.  2. Avoid sedative-hypnotics which may worsen sleep apnea, alcohol and tobacco (as applicable). 3. Advise to lose weight, diet and exercise if not contraindicated (BMI 32). 4. Dental device or ENT procedures can improve mild apnea and snoring- as clinically indicated . 5. Further information regarding OSA may be obtained from [employer] (www.sleepfoundation.org) or American Sleep Apnea Association (www.sleepapnea.org). 6. A follow up appointment will be scheduled in [facility]. The referring provider will be notified of the results.

## 2016-10-29 NOTE — Telephone Encounter (Signed)
-----   Message from Melvyn Novasarmen Dohmeier, MD sent at 10/26/2016  1:25 PM EST ----- IMPRESSION:  1. Mild Obstructive Sleep Apnea(OSA) AHI 6.7/hr. , REM sleep ( AHI 11.9) and supine sleep position ( AHI 16.7) dependent. 2. Primary Snoring, moderate 3. No parasomnia activity.   RECOMMENDATIONS:  Given the high degree of daytime sleepiness and fatigue, I would consider Jon Gonzales a candidate for a CPAP therapy. If his sleepiness is not improved after 30-60 days of CPAP treatment, an evaluation for narcolepsy will follow. I will order a CPAP titration study.   1. Avoid supine sleep if possible, consider using a tennis ball.  2. Avoid sedative-hypnotics which may worsen sleep apnea, alcohol and tobacco (as applicable). 3. Advise to lose weight, diet and exercise if not contraindicated (BMI 32). 4. Dental device or ENT procedures can improve mild apnea and snoring- as clinically indicated . 5. Further information regarding OSA may be obtained from [employer] (www.sleepfoundation.org) or American Sleep Apnea Association (www.sleepapnea.org). 6. A follow up appointment will be scheduled in the [facility] Clinic at [facility]. The referring provider will be notified of the results.

## 2016-10-29 NOTE — Telephone Encounter (Signed)
I called pt. I advised her that his sleep study revealed mild osa with primary snoring. Given his high degree of daytime sleepiness and fatigue, Dr. Vickey Hugerohmeier considers pt a candidate for cpap therapy. If pt's sleepiness is not improved after 30-60 days of cpap, Dr .Vickey Hugerohmeier can consider further narcolepsy treatment. Dr. Vickey Hugerohmeier recommends that pt return for a cpap titration. I advised pt to avoid supine sleep. I advised pt to avoid sedative-hypnotics which may worsen sleep apnea, alcohol and tobacco. I advised pt to lose weight, diet, and exercise if not contraindicated by his other physicians. I advised him that a dental device or ENT procedures can improve mild apnea and snoring as clinically indicated. Pt is agreeable to a cpap titration. Pt verbalized understanding of results. Pt had no questions at this time but was encouraged to call back if questions arise.

## 2016-11-08 ENCOUNTER — Telehealth: Payer: Self-pay | Admitting: Neurology

## 2016-11-08 DIAGNOSIS — G4733 Obstructive sleep apnea (adult) (pediatric): Secondary | ICD-10-CM

## 2016-11-08 NOTE — Telephone Encounter (Signed)
[employer] denied a cpap titration but will approve pt for an auto pap.

## 2016-11-08 NOTE — Telephone Encounter (Signed)
APAP as in ASV ?

## 2016-11-08 NOTE — Telephone Encounter (Signed)
[employer] states this patient qualifies for APAP with supplies.  Denied CPAP.

## 2016-11-09 NOTE — Telephone Encounter (Signed)
[employer] denied CPAP titration, will need Auto

## 2016-11-12 NOTE — Telephone Encounter (Signed)
I called pt. I advised him that [employer] denied cpap titration and pt will need an auto pap. I explained the difference between an auto pap and a cpap. Pt is agreeable to this. I advised him that I will send the order for his auto pap to [facility], and they will call pt within about one week to get his auto pap set up, after [facility] files with [employer] for the pt. Pt is agreeable to this. I reviewed atuto pap compliance expectations with the pt. Pt verbalized understanding. A follow up appt was scheduled with Dr. Vickey Hugerohmeier for 01/03/2017 at 10:30am. Pt verbalized understanding. Pt had no questions at this time but was encouraged to call back if questions arise. Will send pt a letter with all of this information. I verified pt's address with the pt.

## 2017-01-03 ENCOUNTER — Ambulatory Visit (INDEPENDENT_AMBULATORY_CARE_PROVIDER_SITE_OTHER): Payer: BLUE CROSS/BLUE SHIELD | Admitting: Neurology

## 2017-01-03 ENCOUNTER — Encounter: Payer: Self-pay | Admitting: Neurology

## 2017-01-03 VITALS — BP 120/76 | HR 73 | Resp 20 | Ht 70.0 in | Wt 205.0 lb

## 2017-01-03 DIAGNOSIS — G4733 Obstructive sleep apnea (adult) (pediatric): Secondary | ICD-10-CM

## 2017-01-03 DIAGNOSIS — G4719 Other hypersomnia: Secondary | ICD-10-CM | POA: Diagnosis not present

## 2017-01-03 DIAGNOSIS — Z9989 Dependence on other enabling machines and devices: Secondary | ICD-10-CM

## 2017-01-03 DIAGNOSIS — G47429 Narcolepsy in conditions classified elsewhere without cataplexy: Secondary | ICD-10-CM

## 2017-01-03 DIAGNOSIS — G2119 Other drug induced secondary parkinsonism: Secondary | ICD-10-CM

## 2017-01-03 HISTORY — DX: Obstructive sleep apnea (adult) (pediatric): G47.33

## 2017-01-03 HISTORY — DX: Dependence on other enabling machines and devices: Z99.89

## 2017-01-03 HISTORY — DX: Other drug induced secondary parkinsonism: G21.19

## 2017-01-03 MED ORDER — ARMODAFINIL 250 MG PO TABS: 250.0000 mg | ORAL_TABLET | Freq: Every day | ORAL | 5 refills | Status: DC

## 2017-01-03 NOTE — Addendum Note (Signed)
Addended by: Melvyn NovasHMEIER, Evelyne Makepeace on: 01/03/2017 11:19 AM   Modules accepted: Orders

## 2017-01-03 NOTE — Progress Notes (Signed)
SLEEP MEDICINE CLINIC   Provider:  Larey Seat, M D  Referring Provider: Adron Bene, PA-C Primary Care Physician:  Chong Sicilian  Chief Complaint  Patient presents with  . Follow-up    cpap    HPI:  Jon Gonzales is a 56 y.o. male , seen here as a referral from NP. 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  Jon Gonzales is a documented history of hypertension, is taking regular medication and was able to control this condition, hyperlipidemia, hypercholesterolemia, had a recurrent episode of major depression in the past, has some residual anxiety, his diagnosis was coronary artery disease but has no history of myocardial infarction, his last stress test was in 2014. He has been supplemented for testosterone and vit d deficiency, complains of fatigue, insomnia and restless legs. He has had bouts of insomnia in the past and recurrent insomnia earlier this year was seen last on 06/20/2016 by Darrol Jump and addressed this problem.  Chief complaint according to patient : " wife reports that I snore over the last 6 month, I am more hoarse and congested "  " I am a mouth breather"   Sleep habits are as follows: Jon Gonzales reports that he goes to bed very early. He actually struggles to stay awake in the evening hours and he feels excessive fatigue after a day of work. He will usually is and that already between 8 and 9 PM and goes to sleep. He prefers a prone sleep position. The bedroom is cool, quiet and dark. He is using a white noise machine for background. While he can initiate sleep easy, he reports trouble to stay asleep.  His wife has noted him to snore. He usually wakes up within 2 hours, and has great difficulties after interruption of sleep to reinitiate sleep. He will have usually a second break between 3 and 4 AM with the same problem. He wakes because he has the urge to urinate 2-3 times at night. He has attributed  this to a prostate enlargement.  The couple usually rises at 7 AM and by this time he may have gotten 7 hours of sleep, but spent about 11 hours in the bedroom. He does not feel rested and refreshed or restored in the morning and is hard for him to start the day. He relies on an alarm does not wake up spontaneously and often hits the snooze button.  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 He dropped the 5 year old step-daughter at school in the morning. He drinks a can of Diet Coke in the morning.  His office has access to natural daylight, has a window. He feels usually very tired and sleepy after lunch, no matter how extensive the meal is.   Interval history from 01/03/2017, I have pleasure of seeing Jon Gonzales today following his sleep study from 10/16/2016 after he endorsed an Epworth sleepiness score of 19 points. Sleep efficiency was 90%, he had mild apnea with an overall AHI of only 6.7, REM AHI 11.9 and supine AHI 16.7. Given the high degree of daytime sleepiness I opted to treat the patient with CPAP and if his sleepiness would not improve on CPAP he would be evaluated for narcolepsy. Indeed his Epworth sleepiness score is still 16 and he has been a compliant CPAP user. I was able to review a download for  an auto titration between 5 and 15 cm water with 3 cm EPR he has used the machine on average 8 hours and 44 minutes nightly is a 93% compliance and a residual AHI of 0.7 in spite of the reduction of the previously mild apnea he has not experienced a significant increase in daytime alertness. On the contrary he became very hoarse there is no associated discomfort with his hoarseness but she wonders if it is CPAP related.  He had periodic limb movements, confusional arousals and only mild obstructive sleep apnea in disproportion to the degree of daytime sleepiness.  I will now arrange for a narcolepsy evaluation, is also noted Jon Gonzales is  more masked face, the dysphonia,  All this may be medication induced. No family history of Parkinson's disease , RLS ,and his wife has not found him to act out dreams and he has not recalled such.  He lost weight since last visit, reduced his BMI from 32 to 29. 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      Review of Systems: Out of a complete 14 system review, the patient complains of only the following symptoms, and all other reviewed systems are negative. The patient endorsed daytime sleepiness snoring at night, depression, feeling nervous and anxious especially when having trouble to go back to sleep, had in the past sometimes suicidal thoughts after a relapse into major depression. Headaches, environmental allergy, weight gain. EDS.   Epworth score  16 from 19 , Fatigue severity score 62 , depression score 5 /15.  How likely are you to doze in the following situations: 0 = not likely, 1 = slight chance, 2 = moderate chance, 3 = high chance  Sitting and Reading? 2 Watching Television?3 Sitting inactive in a public place (theater or meeting)?2 Lying down in the afternoon when circumstances permit?2 Sitting and talking to someone?3 Sitting quietly after lunch without alcohol?1 In a car, while stopped for a few minutes in traffic?2 As a passenger in a car for an hour without a break?1  Total = 16   Social History   Social History  . Marital status: Married    Spouse name: N/A  . Number of children: N/A  . Years of education: N/A   Occupational History  . Not on file.   Social History Main Topics  . Smoking status: Never Smoker  . Smokeless tobacco: Never Used  . Alcohol use Not on file  . Drug use: Unknown  . Sexual activity: Not on file   Other Topics Concern  . 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      Family History  Problem Relation Age of Onset  . CAD Father  46  . Aneurysm Mother 45  . Heart disease Sister     Unclear    Past Medical History:  Diagnosis Date  . CAD (coronary artery disease)    Cath 2013 LAD 95% stenosis, D1 90% stenosis, RCA 20%, Circ 30%.  Previous stent to D1 2012.   EF 60%.    . Depression   . HTN (hypertension)   . Hyperlipidemia   . Low testosterone   . Nephrolithiasis   . PUD (peptic ulcer disease)     Past Surgical History:  Procedure Laterality Date  . CHOLECYSTECTOMY    . CORONARY ARTERY BYPASS GRAFT  02/19/12   L - LAD, SVG - D1, SVG -  OM  . TONSILLECTOMY AND ADENOIDECTOMY    . WRIST SURGERY      Current Outpatient Prescriptions  Medication Sig Dispense Refill  . amLODipine (NORVASC) 10 MG tablet Take 10 mg by mouth daily.  2  . ARIPiprazole (ABILIFY) 15 MG tablet Take 15 mg by mouth every morning.  0  . aspirin 81 MG tablet Take 81 mg by mouth daily.    . benazepril (LOTENSIN) 40 MG tablet Take 40 mg by mouth daily.    . cloNIDine (CATAPRES) 0.1 MG tablet Take 0.1 mg by mouth as needed.  1  . CRESTOR 10 MG tablet   0  . fexofenadine (ALLEGRA) 30 MG tablet Take 30 mg by mouth daily as needed.    Marland Kitchen KRILL OIL PO Take by mouth.    Marland Kitchen LORazepam (ATIVAN) 1 MG tablet Take 1 mg by mouth every 8 (eight) hours.    . metoprolol succinate (TOPROL-XL) 50 MG 24 hr tablet Take 50 mg by mouth daily. Take with or immediately following a meal.    . Multiple Vitamin (MULTI-VITAMINS) TABS Take by mouth.    . nitroGLYCERIN (NITROSTAT) 0.4 MG SL tablet Place 0.4 mg under the tongue every 5 (five) minutes as needed for chest pain.    Marland Kitchen testosterone cypionate (DEPOTESTOTERONE CYPIONATE) 200 MG/ML injection every 14 (fourteen) days.  2  . topiramate (TOPAMAX) 50 MG tablet Take 50 mg by mouth 2 (two) times daily.     No current facility-administered medications for this visit.     Allergies as of 01/03/2017 - Review Complete 01/03/2017  Allergen Reaction Noted  . Penicillins Rash 11/09/2014    Vitals: BP 120/76    Pulse 73   Resp 20   Ht '5\' 10"'$  (1.778 m)   Wt 205 lb (93 kg)   BMI 29.41 kg/m  Last Weight:  Wt Readings from Last 1 Encounters:  01/03/17 205 lb (93 kg)   ZDG:LOVF mass index is 29.41 kg/m.     Last Height:   Ht Readings from Last 1 Encounters:  01/03/17 '5\' 10"'$  (1.778 m)    Physical exam:  General: The patient is awake, alert and appears not in acute distress. The patient is well groomed. Head: Normocephalic, atraumatic. Neck is supple. Mallampati 2-3  neck circumference:17. Nasal airflow congested , dysphonia and masked face   Retrognathia is seen, crowded lower jaw, overbite. .  Cardiovascular:  Regular rate and rhythm , without  murmurs or carotid bruit, and without distended neck veins. Respiratory: Lungs are clear to auscultation. Skin:  Without evidence of edema, or rash Trunk: BMI is 30. The patient's posture is erect   Neurologic exam : The patient is awake and alert, oriented to place and time.   Memory subjective described as intact.   Attention span & concentration ability appears normal.  Speech is fluent,  without dysarthria, has dysphonia not aphasia.  Mood and affect are appropriate.  Cranial nerves: Pupils are equal and briskly reactive to light. Funduscopic exam without evidence of pallor or edema.  Extraocular movements  in vertical and horizontal planes intact and without nystagmus. Visual fields by finger perimetry are intact. Hearing to finger rub intact.  Facial sensation intact to fine touch. Facial motor strength is symmetric and tongue and uvula move midline. Shoulder shrug was symmetrical.   Motor exam:   Normal tone, muscle bulk and symmetric strength in all extremities.  Sensory:  Fine touch, pinprick and vibration were tested in all extremities.  Proprioception tested in the  upper extremities was normal.  Coordination:  Finger-to-nose maneuver  normal without evidence of ataxia, dysmetria or tremor. Gait and station: Patient walks without  assistive device and is able unassisted to climb up to the exam table. Strength within normal limits.  Stance is stable and normal.Tandem gait is unfragmented.  Deep tendon reflexes: in the  upper and lower extremities are symmetric and intact. Babinski maneuver response is downgoing.    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 Epworth score is 16 down from 19, but he has also lost weight which should have affected the apnea positively. Given that his residual apnea is only 0.7 by do not think that apnea or its treatment have shown a sufficient impact on his daytime sleepiness.   I will allow him to discontinue CPAP therapy, I will follow up with a narcolepsy test. I cannot perform an MS LT in office due to his REM supressant medications.  HLA test was postive - we need to treat for narcolepsy , not apnea.    The patient was advised of the nature of the diagnosed sleep disorder , the treatment options and risks for general a health and wellness arising from not treating the condition.  I spent more than 25  minutes of face to face time with the patient. Greater than 50% of time was spent in counseling and coordination of care. We have discussed the diagnosis and differential and I answered the patient's questions.    Plan:  Treatment plan and additional workup : The patient will be allowed to discontinue CPAP use based on his limited response in terms of excessive daytime sleepiness. I do not think that dysphonia is related to  CPAP use but may be related to a reaction to antidepressants. 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 Modafinil can further reduce the Epworth sleepiness score but often does not eliminate daytime sleepiness. It will allow for 8-10 hours a day of alertness but cannot be taken later than afternoon in order to not interfere with nocturnal sleep. The patient developed cataplectic attacks, I will order Xyrem.  The begin treatment with 250 mg of armodafinil, should the patient feels jittery or anxious with the medication I would ask him to break the tablet in half and to make sure that he has a little bit of food and fluid with it.    Asencion Partridge Pasqual Farias MD  01/03/2017   CC: Adron Bene, Pa-c [address], [place], [place] [address]

## 2017-01-03 NOTE — Addendum Note (Signed)
Addended by: Melvyn NovasHMEIER, Hallel Denherder on: 01/03/2017 11:24 AM   Modules accepted: Orders

## 2017-01-03 NOTE — Patient Instructions (Signed)
The brand name of this medication is called Nuvigil, it allows for daytime alertness between 6 and 10 hours in duration but it can also make depressed patients more anxious, it can cause headaches and some cases and higher blood pressure. I would like to follow you quarterly with the depression assessment as well as taking  blood pressure and other vitals. Please share with Vear ClockLeslie O'Neil.   Armodafinil tablets What is this medicine? ARMODAFINIL (ar moe DAF i nil) is used to treat excessive sleepiness caused by certain sleep disorders. This includes narcolepsy, sleep apnea, and shift work sleep disorder. This medicine may be used for other purposes; ask your health care provider or pharmacist if you have questions. COMMON BRAND NAME(S): Nuvigil What should I tell my health care provider before I take this medicine? They need to know if you have any of these conditions: -bipolar disorder -depression -drug or alcohol abuse or addiction -heart disease -high blood pressure -kidney disease -liver disease -schizophrenia -suicidal thoughts, plans, or attempt; a previous suicide attempt by you or a family member -an unusual or allergic reaction to armodafinil, modafinil, medicines, foods, dyes, or preservatives -pregnant or trying to get pregnant -breast-feeding How should I use this medicine? Take this medicine by mouth with a glass of water. Follow the directions on the prescription label. Take your doses at regular intervals. Do not take your medicine more often than directed. Do not stop taking this medicine suddenly except upon the advice of your doctor. Stopping this medicine too quickly may cause serious side effects or your condition may worsen. A special MedGuide will be given to you by the pharmacist with each prescription and refill. Be sure to read this information carefully each time. Talk to your pediatrician regarding the use of this medicine in children. While this drug may be  prescribed for children as young as 55 years of age for selected conditions, precautions do apply. Overdosage: If you think you have taken too much of this medicine contact a poison control center or emergency room at once. NOTE: This medicine is only for you. Do not share this medicine with others. What if I miss a dose? If you miss a dose, take it as soon as you can. If it is almost time for your next dose, take only that dose. Do not take double or extra doses. What may interact with this medicine? Do not take this medicine with any of the following medications: -amphetamine or dextroamphetamine -dexmethylphenidate or methylphenidate -MAOIs like Carbex, Eldepryl, Marplan, Nardil, and Parnate -pemoline -procarbazine This medicine may also interact with the following medications: -antifungal medicines like itraconazole or ketoconazole -barbiturates, like phenobarbital -birth control pills or other hormone-containing birth control devices or implants -carbamazepine -cyclosporine -diazepam -medicines for depression, anxiety, or psychotic disturbances -phenytoin -propranolol -triazolam -warfarin This list may not describe all possible interactions. Give your health care provider a list of all the medicines, herbs, non-prescription drugs, or dietary supplements you use. Also tell them if you smoke, drink alcohol, or use illegal drugs. Some items may interact with your medicine. What should I watch for while using this medicine? Visit your doctor or health care professional for regular checks on your progress. The full effect of this medicine may not be seen right away. This medicine may affect your concentration, function, or may hide signs that you are tired. You may get dizzy. This medicine will not eliminate your abnormal tendency to fall asleep and is not a replacement for sleep. Do not change your  previous behavior regarding potentially dangerous activities, such as driving, using  machinery, or doing anything that needs mental alertness until you know how this drug affects you. Alcohol can make you more dizzy and may interfere with your response to this medicine or your alertness. Avoid alcoholic drinks. Birth control pills may not work properly while you are taking this medicine. While using birth control pills, you will need an additional barrier method or an alternative non-hormonal method of birth control during treatment with armodafinil and for 1 month after stopping armodafinil. Talk to your doctor about which extra method of birth control is right for you. It is unknown if the effects of this medicine will be increased by the use of caffeine. Caffeine is found in many foods, beverages, and medications. Ask your doctor if you should limit or change your intake of caffeine-containing products while on this medicine. Do not stop previously prescribed treatments for your condition, such as a CPAP machine, except on the advise of your physician or health care professional. What side effects may I notice from receiving this medicine? Side effects that you should report to your doctor or health care professional as soon as possible: -allergic reactions like skin rash, itching or hives, swelling of the face, lips, or tongue -anxiety -breathing or swallowing problems -chest pain -depressed mood -elevated mood, decreased need for sleep, racing thoughts, impulsive behavior -fast, irregular heartbeat -fever with rash, swollen lymph nodes, or swelling of the face -hallucination, loss of contact with reality -increased blood pressure -mouth sores, blisters, or peeling skin -sore throat, fever, or chills -suicidal thoughts or other mood changes -tremors -vomiting Side effects that usually do not require medical attention (report to your doctor or health care professional if they continue or are bothersome): -headache -nausea, diarrhea, or stomach upset -nervousness -trouble  sleeping This list may not describe all possible side effects. Call your doctor for medical advice about side effects. You may report side effects to FDA at 1-800-FDA-1088. Where should I keep my medicine? Keep out of the reach of children. Store at room temperature between 20 and 25 degrees C (68 and 77 degrees F). Throw away any unused medicine after the expiration date. NOTE: This sheet is a summary. It may not cover all possible information. If you have questions about this medicine, talk to your doctor, pharmacist, or health care provider.  2018 Elsevier/Gold Standard (2016-03-09 18:16:30)

## 2017-02-21 ENCOUNTER — Ambulatory Visit: Payer: BLUE CROSS/BLUE SHIELD | Admitting: Cardiology

## 2017-02-26 DIAGNOSIS — I10 Essential (primary) hypertension: Secondary | ICD-10-CM | POA: Diagnosis not present

## 2017-02-26 DIAGNOSIS — G459 Transient cerebral ischemic attack, unspecified: Secondary | ICD-10-CM

## 2017-02-26 DIAGNOSIS — F419 Anxiety disorder, unspecified: Secondary | ICD-10-CM

## 2017-02-26 DIAGNOSIS — I2510 Atherosclerotic heart disease of native coronary artery without angina pectoris: Secondary | ICD-10-CM

## 2017-02-26 DIAGNOSIS — E784 Other hyperlipidemia: Secondary | ICD-10-CM

## 2017-02-27 DIAGNOSIS — F419 Anxiety disorder, unspecified: Secondary | ICD-10-CM | POA: Diagnosis not present

## 2017-02-27 DIAGNOSIS — E784 Other hyperlipidemia: Secondary | ICD-10-CM | POA: Diagnosis not present

## 2017-02-27 DIAGNOSIS — G459 Transient cerebral ischemic attack, unspecified: Secondary | ICD-10-CM | POA: Diagnosis not present

## 2017-02-27 DIAGNOSIS — I2510 Atherosclerotic heart disease of native coronary artery without angina pectoris: Secondary | ICD-10-CM | POA: Diagnosis not present

## 2017-02-28 DIAGNOSIS — G459 Transient cerebral ischemic attack, unspecified: Secondary | ICD-10-CM | POA: Diagnosis not present

## 2017-02-28 DIAGNOSIS — I2510 Atherosclerotic heart disease of native coronary artery without angina pectoris: Secondary | ICD-10-CM | POA: Diagnosis not present

## 2017-02-28 DIAGNOSIS — E784 Other hyperlipidemia: Secondary | ICD-10-CM | POA: Diagnosis not present

## 2017-02-28 DIAGNOSIS — F419 Anxiety disorder, unspecified: Secondary | ICD-10-CM | POA: Diagnosis not present

## 2017-04-11 ENCOUNTER — Encounter: Payer: Self-pay | Admitting: Neurology

## 2017-04-11 ENCOUNTER — Ambulatory Visit (INDEPENDENT_AMBULATORY_CARE_PROVIDER_SITE_OTHER): Payer: BLUE CROSS/BLUE SHIELD | Admitting: Neurology

## 2017-04-11 VITALS — BP 121/84 | HR 68 | Ht 70.0 in | Wt 187.5 lb

## 2017-04-11 DIAGNOSIS — R55 Syncope and collapse: Secondary | ICD-10-CM | POA: Diagnosis not present

## 2017-04-11 DIAGNOSIS — G4719 Other hypersomnia: Secondary | ICD-10-CM

## 2017-04-11 NOTE — Progress Notes (Signed)
SLEEP MEDICINE CLINIC   Provider:  Melvyn Novasarmen  Husein Guedes, M D  Referring Provider: Gerre Pebblesavis, Sally, PA-C Primary Care Physician:  Gerre Pebblesavis, Sally, PA-C  Chief Complaint  Patient presents with  . Narcolepsy    He was recently in [facility] due to TIA and started on Plavix.  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    HPI:  Interval history from 04/11/2017. Jon Gonzales has been admitted to [facility] in [place] on 02/28/2017 with very low blood pressure and was discharged with a presumed diagnosis of TIA. Imaging studies have not shown abnormalities, he underwent MRI and CT of the head both negative. his medication was changed; Nuvigil being discontinued he also was asked to change from aspirin to Plavix and amlodipine was discontinued. The change from aspirin to Plavix may have been precipitated by a finding of thrombocytopenia. He recalls vividly that 2 neurologists commented on his case by telemedicine and that they were not unified in the diagnosis. He was asked to follow-up with his neurologist. 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 He does not think he was dehydrated when he presented to the emergency room. He did not have lateralized weakness, he was fatigued sleepy and presyncopal. Based on this I doubt that he has his TIA. His Epworth sleepiness score today is 10 points and his fatigue severity score 30 points. He is doing well for now without the Provigil I would order the medication back for him if he needs it. Carotid Doppler studies were performed as well as cardiac echo, his workup was nonremarkable. He already had  blood work done to follow-up on thrombocytopenia - he seems to have recovered. I do not have access to his laboratory records here. No xyrem need as epworth is only 10      Jon Gonzales is a 56 y.o. male , seen here as a referral from NP. 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 Blane OharaKirsten Laubach, MD , his wife.  Jon Gonzales is a documented history of hypertension, is taking regular medication and was able to control this condition, hyperlipidemia, hypercholesterolemia, had a recurrent episode of major depression in the past, has some residual anxiety, his diagnosis was coronary artery disease but has no history of myocardial infarction, his last stress test was in 2014. He has been supplemented for testosterone and vit d deficiency, complains of fatigue, insomnia and restless legs. He has had bouts of insomnia in the past and recurrent insomnia earlier this year was seen last on 06/20/2016 by Gus HeightAndrea Johnson and addressed this problem. Chief complaint according to patient : " wife reports that I snore over the last 6 month, I am more hoarse and congested "  " I am a mouth breather"   Sleep habits are as follows:Jon Gonzales reports that he goes to bed very early. He actually struggles to stay awake in the evening hours and he feels excessive fatigue after a day of work. He will usually is and that already between 8 and 9 PM and goes to sleep. He prefers a prone sleep position. The bedroom is cool, quiet and dark. He is using a white noise machine for background. While he can initiate sleep easy, he reports trouble to stay asleep.  His wife has noted him to snore. He usually wakes up within 2 hours, and has great difficulties after interruption of sleep to reinitiate sleep. He will have usually  a second break between 3 and 4 AM with the same problem. He wakes because he has the urge to urinate 2-3 times at night. He has attributed this to a prostate enlargement.  The couple usually rises at 7 AM and by this time he may have gotten 7 hours of sleep, but spent about 11 hours in the bedroom. He does not feel rested and refreshed or restored in the morning and is hard for him to start the day. He relies on an alarm does not wake up spontaneously and often hits the snooze button.  He  regularly eats breakfast at home before going to the medical practice, his commute is 5 minutes. He dropped the 29 year old step-daughter at school in the morning. He drinks a can of Diet Coke in the morning.  His office has access to natural daylight, has a window. He feels usually very tired and sleepy after lunch, no matter how extensive the meal is.   Interval history from 01/03/2017, I have pleasure of seeing Jon Gonzales today following his sleep study from 10/16/2016 after he endorsed an Epworth sleepiness score of 19 points. Sleep efficiency was 90%, he had mild apnea with an overall AHI of only 6.7, REM AHI 11.9 and supine AHI 16.7. Given the high degree of daytime sleepiness I opted to treat the patient with CPAP and if his sleepiness would not improve on CPAP he would be evaluated for narcolepsy. Indeed his Epworth sleepiness score is still 16 and he has been a compliant CPAP user. I was able to review a download for an auto titration between 5 and 15 cm water with 3 cm EPR he has used the machine on average 8 hours and 44 minutes nightly is a 93% compliance and a residual AHI of 0.7 in spite of the reduction of the previously mild apnea he has not experienced a significant increase in daytime alertness. On the contrary he became very hoarse there is no associated discomfort with his hoarseness but she wonders if it is CPAP related.  He had periodic limb movements, confusional arousals and only mild obstructive sleep apnea in disproportion to the degree of daytime sleepiness.  I will now arrange for a narcolepsy evaluation, is also noted Jon Gonzales is more masked face, the dysphonia,  All this may be medication induced. No family history of Parkinson's disease , RLS ,and his wife has not found him to act out dreams and he has not recalled such.  He lost weight since last visit, reduced his BMI from 32 to 29. 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      Review of Systems: Out  of a complete 14 system review, the patient complains of only the following symptoms, and all other reviewed systems are negative. The patient endorsed daytime sleepiness snoring at night, depression, feeling nervous and anxious especially when having trouble to go back to sleep, had in the past sometimes suicidal thoughts after a relapse into major depression. Headaches, environmental allergy, weight gain. EDS.   Epworth score  16 from 19 , Fatigue severity score 62 , depression score 5 /15.  How likely are you to doze in the following situations: 0 = not likely, 1 = slight chance, 2 = moderate chance, 3 = high chance  Sitting and Reading? 1 Watching Television? 2 Sitting inactive in a public place (theater or meeting)?2 Lying down in the afternoon when circumstances permit?1 Sitting and talking to someone?2 Sitting quietly after lunch without alcohol?0  In a car, while stopped for a few minutes in traffic?2 As a passenger in a car for an hour without a break?0  Total = 10 from 16    Social History   Social History  . Marital status: Married    Spouse name: N/A  . Number of children: N/A  . Years of education: N/A   Occupational History  . Not on file.   Social History Main Topics  . Smoking status: Never Smoker  . Smokeless tobacco: Never Used  . Alcohol use Not on file  . Drug use: Unknown  . Sexual activity: Not on file   Other Topics Concern  . 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      Family History  Problem Relation Age of Onset  . CAD Father 92  . Aneurysm Mother 61  . Heart disease Sister        Unclear    Past Medical History:  Diagnosis Date  . CAD (coronary artery disease)    Cath 2013 LAD 95% stenosis, D1 90% stenosis, RCA 20%, Circ 30%.  Previous stent to D1 2012.   EF 60%.    . Depression   . HTN (hypertension)   . Hyperlipidemia   . Low testosterone   .  Nephrolithiasis   . PUD (peptic ulcer disease)   . TIA (transient ischemic attack)     Past Surgical History:  Procedure Laterality Date  . CHOLECYSTECTOMY    . CORONARY ARTERY BYPASS GRAFT  02/19/12   L - LAD, SVG - D1, SVG - OM  . TONSILLECTOMY AND ADENOIDECTOMY    . WRIST SURGERY      Current Outpatient Prescriptions  Medication Sig Dispense Refill  . clopidogrel (PLAVIX) 75 MG tablet Take 75 mg by mouth daily.  0  . CRESTOR 10 MG tablet   0  . KRILL OIL PO Take by mouth.    Marland Kitchen LORazepam (ATIVAN) 1 MG tablet Take 1 mg by mouth every 8 (eight) hours.    Marland Kitchen lurasidone (LATUDA) 40 MG TABS tablet Take 40 mg by mouth daily.    . metoprolol succinate (TOPROL-XL) 50 MG 24 hr tablet Take 50 mg by mouth daily. Take with or immediately following a meal.    . Multiple Vitamin (MULTI-VITAMINS) TABS Take by mouth.    . nitroGLYCERIN (NITROSTAT) 0.4 MG SL tablet Place 0.4 mg under the tongue every 5 (five) minutes as needed for chest pain.    Marland Kitchen testosterone cypionate (DEPOTESTOTERONE CYPIONATE) 200 MG/ML injection every 14 (fourteen) days.  2   No current facility-administered medications for this visit.     Allergies as of 04/11/2017 - Review Complete 04/11/2017  Allergen Reaction Noted  . Penicillins Rash 11/09/2014    Vitals: BP 121/84   Pulse 68   Ht 5\' 10"  (1.778 m)   Wt 187 lb 8 oz (85 kg)   BMI 26.90 kg/m  Last Weight:  Wt Readings from Last 1 Encounters:  04/11/17 187 lb 8 oz (85 kg)   ZOX:WRUE mass index is 26.9 kg/m.     Last Height:   Ht Readings from Last 1 Encounters:  04/11/17 5\' 10"  (1.778 m)    Physical exam:  General: The patient is awake, alert and appears not in acute distress. The patient is well groomed. Head: Normocephalic, atraumatic. Neck is supple. Mallampati 2-3  neck circumference:17. Nasal airflow congested ,  dysphonia and masked face   Retrognathia is seen, crowded lower jaw, overbite. .  Cardiovascular:  Regular rate and rhythm , without  murmurs  or carotid bruit, and without distended neck veins. Respiratory: Lungs are clear to auscultation. Skin:  Without evidence of edema, or rash Trunk: BMI is 30. The patient's posture is erect   Neurologic exam : The patient is awake and alert, oriented to place and time.   Cranial nerves: Pupils are equal and briskly reactive to light.   Extraocular movements  in vertical and horizontal planes intact and without nystagmus. Visual fields by finger perimetry are intact. Hearing to finger rub intact.  Facial sensation intact to fine touch. Facial motor strength is symmetric and tongue and uvula move midline. Shoulder shrug was symmetrical.   Motor exam:   Normal tone, muscle bulk and symmetric strength in all extremities. Sensory:  Symmetric .Proprioception tested in the upper extremities was normal. Coordination:  Finger-to-nose maneuver without evidence of ataxia, dysmetria or tremor.    Assessment:  After physical and neurologic examination, review of laboratory studies,  Personal review of imaging studies, reports of other /same  Imaging studies ,  Results of polysomnography/ neurophysiology testing and pre-existing records as far as provided in visit., my assessment is   1) Jon Gonzales has responded well to changes in his antidepressant medication, and along a reduction of daytime sleepiness has also followed. His Epworth score is decreased from 16 points to 10 points.  I see him today because of a presumed TIA that he suffered in mid May. Based on his records the whole workup was negative, and the symptoms he describes are not classic TIA symptoms. I'm glad that he had a full neurologic and Cardiologic workup, but his report of having low blood pressure at the time of symptoms is evidence that he may have had a medication side effect or dehydration as the cause of the spell. Stroke patients and TIA patients usually have high blood pressure. This may have been a presyncopal episode. However I'm  okay with him changing from aspirin to Plavix based on a condition of thrombocytopenia that was reported in his discharge record. His wife and primary care physician has already followed up and he states that the labs have normalized. Since he is doing well without the Nuvigil right now I will not need to treat him any further for narcolepsy. He can have a revisit in 6 months as usual.   Sincerely,   Melvyn Novas MD  04/11/2017   CC: Gerre Pebbles, Pa-c [address] [place], [place] [address]

## 2017-05-30 ENCOUNTER — Telehealth: Payer: Self-pay | Admitting: Neurology

## 2017-05-30 NOTE — Telephone Encounter (Signed)
Jon Gonzales/Dr Blane OharaKirsten Gonzales called the clinic Dr Sedalia Mutaox is requesting Dr D call her when available at [phone]

## 2017-06-03 ENCOUNTER — Telehealth: Payer: Self-pay | Admitting: Neurology

## 2017-06-03 MED ORDER — ARMODAFINIL 200 MG PO TABS: 200.0000 mg | ORAL_TABLET | ORAL | 5 refills | Status: DC

## 2017-06-03 NOTE — Telephone Encounter (Signed)
Dr. Sedalia Muta called me back and reported that the patient's psychiatrist, Vear Clock, was concerned about starting modafinil as it could induce hypomania or mania in a bipolar patient. Therefore the patient never filled the prescription which is now 6 months old. Dr. Sedalia Muta asked to give it at least a trial, as her husband is now unable to work, excessively daytime sleepy and also appears cognitively impaired. I agreed to send a prescription for generic modafinil 200 mg daily with 3 refills. At the end of the prescription period I  should have another revisit with the patient

## 2017-06-03 NOTE — Telephone Encounter (Signed)
I have spoken to Dr. Shayne Alken, the patient's wife who works as a family Loss adjuster, chartered in [place]. I explained that the patient had an HLA test for narcolepsy, which returned positive. Unfortunately, this test alone will not be accepted by his insurance is approved that she has narcolepsy. His sleep study also confirmed that he has apnea and we have to treat the apnea first to see if his Epworth sleepiness score decreases with successful treatment of OSA.  This has not been the case.  The patient is on antidepressant medication that does not allow a valid mean sleep latency tests to follow. Patients with depression also at a higher risk group of becoming more depressed on Xyrem. I would suggest a stimulant for modafinil as first-line treatment if the patient should not find relief for his symptoms, we will try to get Xyrem approved without MS LT.  Jon Gonzales

## 2017-06-03 NOTE — Telephone Encounter (Signed)
I left a voicemail on Friday, August 17 and today over lunchtime with [facility]. I expect a call back anytime.

## 2017-06-04 NOTE — Telephone Encounter (Signed)
I called and spoke with Dr Sedalia Muta in regards to setting her husband up with an appointment in October. I had an apt on Oct 9th at 9:30am. I will put Mr Schembri down for that date. Pt wife verbalized understanding.

## 2017-06-05 ENCOUNTER — Telehealth: Payer: Self-pay | Admitting: Neurology

## 2017-06-05 NOTE — Telephone Encounter (Signed)
PA completed for Armodafinil on cover my meds. Plan sent to [facility] and will await for a response in 3 days. KEY: [id number].

## 2017-06-06 NOTE — Telephone Encounter (Signed)
Stephanie/BC (p) [phone] is requesting sleep study to faxed and needs to know if the pt will be using a CPAP. Also will the pt be using nuvil and provigil together? (f) [phone] attn: Judeth Cornfield

## 2017-06-07 NOTE — Telephone Encounter (Signed)
This information has been fax'd to [facility] today

## 2017-06-11 NOTE — Telephone Encounter (Signed)
PA approved from 06/05/2017-10/14/2038.  RFX:[id number]

## 2017-07-23 ENCOUNTER — Ambulatory Visit: Payer: Self-pay | Admitting: Neurology

## 2017-09-04 MED FILL — LITHIUM CARBONATE ER 300 MG: 300 | 30 days supply | Qty: 60 | Fill #0

## 2017-09-11 DIAGNOSIS — F3189 Other bipolar disorder: Secondary | ICD-10-CM | POA: Diagnosis not present

## 2017-09-12 DIAGNOSIS — F3189 Other bipolar disorder: Secondary | ICD-10-CM | POA: Diagnosis not present

## 2017-10-07 MED FILL — DULoxetine HCL 60 MG CPEP: 60 | 30 days supply | Qty: 30 | Fill #0

## 2017-10-07 MED FILL — LITHIUM CARBONATE ER 300 MG: 300 | 30 days supply | Qty: 60 | Fill #0

## 2017-10-23 ENCOUNTER — Ambulatory Visit: Payer: BLUE CROSS/BLUE SHIELD | Admitting: Neurology

## 2017-10-25 MED FILL — ROSUVASTATIN CALCIUM 10 MG: 10 | 90 days supply | Qty: 90 | Fill #0

## 2017-10-25 MED FILL — BENAZEPRIL HCL 20 MG TABLET: 20 | 90 days supply | Qty: 90 | Fill #0

## 2017-10-25 MED FILL — METOPROLOL SUCC ER 50 MG TA: 50 | 90 days supply | Qty: 90 | Fill #0

## 2017-10-29 DIAGNOSIS — Z0000 Encounter for general adult medical examination without abnormal findings: Secondary | ICD-10-CM | POA: Diagnosis not present

## 2017-10-29 DIAGNOSIS — E298 Other testicular dysfunction: Secondary | ICD-10-CM | POA: Diagnosis not present

## 2017-10-29 DIAGNOSIS — Z125 Encounter for screening for malignant neoplasm of prostate: Secondary | ICD-10-CM | POA: Diagnosis not present

## 2017-11-01 MED FILL — DULoxetine HCL 60 MG CPEP: 60 | 30 days supply | Qty: 30 | Fill #0

## 2017-11-01 MED FILL — LITHIUM CARBONATE ER 300 MG: 300 | 30 days supply | Qty: 60 | Fill #0 | Status: TO

## 2018-01-13 MED FILL — METOPROLOL SUCCINATE ER 50: 50 | 90 days supply | Qty: 90 | Fill #0

## 2018-01-13 MED FILL — DULoxetine HCL 60 MG CPEP: 60 | 90 days supply | Qty: 180 | Fill #0

## 2018-01-13 MED FILL — LORazepam 1 MG TABS: 1 | 60 days supply | Qty: 180 | Fill #0

## 2018-01-13 MED FILL — ROSUVASTATIN CALCIUM 10 MG: 10 | 90 days supply | Qty: 90 | Fill #0 | Status: TO

## 2018-01-13 MED FILL — NITROGLYCERIN 0.4 MG TAB SL: 0.4 | 30 days supply | Qty: 25 | Fill #0

## 2018-01-13 MED FILL — BENAZEPRIL HCL 20 MG TABLET: 20 | 90 days supply | Qty: 90 | Fill #0

## 2018-01-22 MED FILL — LITHIUM CARBONATE ER 300 MG: 300 | 90 days supply | Qty: 180 | Fill #0 | Status: TO

## 2018-04-22 MED FILL — METOPROLOL SUCCINATE ER 50: 50 | 90 days supply | Qty: 90 | Fill #0

## 2018-04-22 MED FILL — LITHIUM CARBONATE ER 300 MG: 300 | 90 days supply | Qty: 180 | Fill #0

## 2018-04-22 MED FILL — ROSUVASTATIN CALCIUM 10 MG: 10 | 90 days supply | Qty: 90 | Fill #0

## 2018-04-22 MED FILL — LORazepam 1 MG TABS: 1 | 60 days supply | Qty: 180 | Fill #0

## 2018-05-15 MED FILL — NITROGLYCERIN 0.4 MG TAB SL: 0.4 | 30 days supply | Qty: 50 | Fill #0

## 2018-05-15 MED FILL — SHIPPING COST: 1 days supply | Qty: 1 | Fill #0

## 2018-05-15 MED FILL — CloNIDine HCL 0.1 MG TAB: 0.1 | 30 days supply | Qty: 60 | Fill #0

## 2018-05-15 MED FILL — LIDOCAINE 2% VISCOUS SOLN: 2 | 1 days supply | Qty: 100 | Fill #0

## 2018-07-25 MED FILL — ROSUVASTATIN CALCIUM 10 MG: 10 | 90 days supply | Qty: 90 | Fill #1

## 2018-07-25 MED FILL — METOPROLOL SUCCINATE ER 50: 50 | 90 days supply | Qty: 90 | Fill #1

## 2018-07-25 MED FILL — LITHIUM CARBONATE ER 300 MG: 300 | 90 days supply | Qty: 180 | Fill #1

## 2018-07-25 MED FILL — DULoxetine HCL 60 MG CPEP: 60 | 90 days supply | Qty: 180 | Fill #1

## 2018-08-15 MED FILL — SHIPPING COST: 1 days supply | Qty: 1 | Fill #1

## 2018-08-15 MED FILL — EPINEPHRINE 0.3 MG AUTO-INJ: 0.3 | 2 days supply | Qty: 2 | Fill #0

## 2018-09-25 MED FILL — LORazepam 1 MG TABS: 1 | 60 days supply | Qty: 180 | Fill #0

## 2018-09-25 MED FILL — SHIPPING COST: 1 days supply | Qty: 1 | Fill #2

## 2018-10-27 MED FILL — BENAZEPRIL HCL 20 MG TABLET: 20 | 90 days supply | Qty: 90 | Fill #0

## 2018-10-27 MED FILL — DULOXETINE HCL 60 MG CPEP: 60 | 90 days supply | Qty: 180 | Fill #0

## 2018-10-27 MED FILL — LITHIUM CARBONATE ER 300 MG: 300 | 90 days supply | Qty: 180 | Fill #0

## 2018-10-27 MED FILL — SHIPPING COST: 1 days supply | Qty: 1 | Fill #3

## 2018-10-27 MED FILL — OMEPRAZOLE 20 MG CPDR: 20 | 90 days supply | Qty: 90 | Fill #0

## 2018-10-27 MED FILL — ROSUVASTATIN CALCIUM 10 MG: 10 | 90 days supply | Qty: 90 | Fill #0

## 2018-10-27 MED FILL — METOPROLOL SUCCINATE ER 50: 50 | 90 days supply | Qty: 90 | Fill #0

## 2019-01-06 MED FILL — BENAZEPRIL HCL 20 MG TABLET: 20 | 90 days supply | Qty: 90 | Fill #0

## 2019-01-06 MED FILL — ROSUVASTATIN CALCIUM 10 MG: 10 | 90 days supply | Qty: 90 | Fill #0

## 2019-01-06 MED FILL — LORazepam 1 MG TABS: 1 | 45 days supply | Qty: 180 | Fill #0

## 2019-01-06 MED FILL — LITHIUM CARBONATE ER 300 MG: 300 | 90 days supply | Qty: 180 | Fill #0

## 2019-01-06 MED FILL — METOPROLOL SUCCINATE ER 50: 50 | 90 days supply | Qty: 90 | Fill #0

## 2019-01-06 MED FILL — OMEPRAZOLE 20 MG CPDR: 20 | 90 days supply | Qty: 90 | Fill #0

## 2019-04-08 MED FILL — DULOXETINE HCL 60 MG CPEP: 60 | 90 days supply | Qty: 180 | Fill #0

## 2019-04-08 MED FILL — METOPROLOL SUCCINATE ER 50: 50 | 90 days supply | Qty: 90 | Fill #0

## 2019-04-08 MED FILL — LITHIUM CARBONATE ER 300 MG: 300 | 90 days supply | Qty: 180 | Fill #0

## 2019-04-08 MED FILL — ROSUVASTATIN CALCIUM 10 MG: 10 | 90 days supply | Qty: 90 | Fill #0

## 2019-04-08 MED FILL — LORazepam 1 MG TABS: 1 | 60 days supply | Qty: 180 | Fill #0

## 2019-04-08 MED FILL — ESZOPICLONE 3 MG TABS: 3 | 90 days supply | Qty: 90 | Fill #0

## 2019-04-08 MED FILL — OMEPRAZOLE 20 MG CPDR: 20 | 90 days supply | Qty: 90 | Fill #0

## 2019-04-08 MED FILL — BENAZEPRIL HCL 20 MG TABLET: 20 | 90 days supply | Qty: 90 | Fill #0

## 2019-04-30 MED FILL — CloNIDine HCL 0.1 MG TAB: 0.1 | 30 days supply | Qty: 60 | Fill #0

## 2019-07-14 ENCOUNTER — Encounter: Payer: Self-pay | Admitting: Neurology

## 2019-07-14 MED FILL — LORazepam 1 MG TABS: 1 | 60 days supply | Qty: 180 | Fill #0

## 2019-07-17 MED FILL — METOPROLOL SUCCINATE ER 50: 50 | 90 days supply | Qty: 90 | Fill #1

## 2019-07-17 MED FILL — CloNIDine HCL 0.1 MG TAB: 0.1 | 30 days supply | Qty: 60 | Fill #1

## 2019-07-17 MED FILL — OMEPRAZOLE 20 MG CAP: 20 | 90 days supply | Qty: 90 | Fill #1

## 2019-07-17 MED FILL — ROSUVASTATIN CALCIUM 10 MG: 10 | 90 days supply | Qty: 90 | Fill #1

## 2019-07-17 MED FILL — ESZOPICLONE 3 MG TABS: 3 | 90 days supply | Qty: 90 | Fill #1

## 2019-07-17 MED FILL — LITHIUM CARBONATE ER 300 MG: 300 | 90 days supply | Qty: 180 | Fill #1

## 2019-07-17 MED FILL — DULOXETINE HCL 60 MG CPEP: 60 | 90 days supply | Qty: 180 | Fill #1

## 2019-07-20 NOTE — Progress Notes (Signed)
Jon Gonzales was seen today in the movement disorders clinic for neurologic consultation at the request of Lillard Anes,*.  The consultation is for the evaluation of tremor.  Wife is present and supplements history.  Patient's wife is a primary care physician.  Medical records made available to me are reviewed, including records from Dr. Brett Fairy, who the patient saw in 2017 and 2018.  While the patient was primarily seeing her for sleep issues (negative for sleep apnea, worked up for possible narcolepsy, But got better when his antidepressants were changed so stopped Nuvigil), Dr. 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  Masked face, dysphonia and rigid posture."  There was no other comment made about it or recommendations seen.  Tremor: Yes.     How long has it been going on? Got worse after having COVID in August, 2020 (better over the last 3 days); doesn't recall any tremor prior to this  At rest or with activation?  both  Fam hx of tremor?  No.  Located where?  Bilateral UE, shake equally.  Worse with heavy weight  Affected by caffeine:  unknown (drinks 4 cans of diet coke per day_  Affected by alcohol:  No. (2-3 beers 5 days per week)  Affected by stress:  Yes.    Affected by fatigue:  No.  Spills soup if on spoon:  Noting that dripping cereal   Spills glass of liquid if full:  No.  Affects ADL's (tying shoes, brushing teeth, etc):  No.  Tremor inducing meds:  Yes.   lithium (since 07/2017); prior Latuda (off for years)  Other Specific Symptoms:  Voice: no change Sleep: sleeps a lot (doesn't wear cpap) - goes to sleep at 8:30pm and awakens at 7am.    Vivid Dreams:  No.  Acting out dreams:  No. Wet Pillows: No. Postural symptoms:  Yes.  , worse at night when gets up to use the RR.  Wife states that balance been bad since dx with covid.  Wife notes more trouble with walking but will be able to catch  himself.  Notices that he has to sit down to put on pants  Falls?  No. Bradykinesia symptoms: difficulty getting out of a chair (if low) Loss of smell:  No., returned after resolution of covid Loss of taste:  No. Urinary Incontinence:  No. Difficulty Swallowing:  No. Handwriting, micrographia: No. but shaky Depression:  Not right now; feels anxious with work related things Memory changes:  Yes.  , some.  Some word finding trouble and wife states that he can repeat stories.   Hallucinations:  No.  visual distortions: Yes.   N/V:  No. Lightheaded:  No.  Syncope: No. Diplopia:  No.   Neuroimaging of the brain has  previously been performed.  I have reviewed the patient's MRI of the brain dated Mar 01, 2017.  It was unremarkable with the exception of a small area of focal restricted diffusion in the splenium of the corpus callosum.  There was no significant atrophy or small vessel disease.    ALLERGIES:   Allergies  Allergen Reactions  . Penicillins Rash    CURRENT MEDICATIONS:  Current Outpatient Medications  Medication Instructions  . benazepril (LOTENSIN) 20 MG tablet Oral  . cloNIDine (CATAPRES) 0.1 MG tablet No dose, route, or frequency recorded.  . CRESTOR 10 MG tablet No dose, route, or frequency recorded.  Marland Kitchen dexamethasone (DECADRON)  1.5 MG tablet TAKE 6 TABLETS ON DAY 1 AS DIRECTED ON PACKAGE AND DECREASE BY 1 TAB EACH DAY FOR A TOTAL OF 6 DAYS  . DULoxetine (CYMBALTA) 60 MG capsule No dose, route, or frequency recorded.  . Eszopiclone 3 MG TABS Oral  . lithium carbonate (LITHOBID) 300 MG CR tablet No dose, route, or frequency recorded.  Marland Kitchen LORazepam (ATIVAN) 1 mg, Oral, Every 8 hours  . metoprolol succinate (TOPROL-XL) 50 mg, Oral, Daily, Take with or immediately following a meal.   . nitroGLYCERIN (NITROSTAT) 0.4 mg, Sublingual, Every 5 min PRN  . omeprazole (PRILOSEC) 20 MG capsule No dose, route, or frequency recorded.  . sulfamethoxazole-trimethoprim (BACTRIM DS)  800-160 MG tablet TAKE 1 TABLET BY ORAL ROUTE TIMES PER DAY    PAST MEDICAL HISTORY:   Past Medical History:  Diagnosis Date  . CAD (coronary artery disease)    Cath 2013 LAD 95% stenosis, D1 90% stenosis, RCA 20%, Circ 30%.  Previous stent to D1 2012.   EF 60%.    . Depression   . HTN (hypertension)   . Hyperlipidemia   . Low testosterone   . Nephrolithiasis   . PUD (peptic ulcer disease)   . TIA (transient ischemic attack)     PAST SURGICAL HISTORY:   Past Surgical History:  Procedure Laterality Date  . CHOLECYSTECTOMY    . CORONARY ARTERY BYPASS GRAFT  02/19/12   L - LAD, SVG - D1, SVG - OM  . TONSILLECTOMY AND ADENOIDECTOMY    . WRIST SURGERY      SOCIAL HISTORY:   Social History   Socioeconomic History  . Marital status: Married    Spouse name: Not on file  . Number of children: Not on file  . Years of education: Not on file  . Highest education level: Bachelor's degree (e.g., BA, AB, BS)  Occupational History  . Not on file  Social Needs  . Financial resource strain: Not on file  . Food insecurity    Worry: Not on file    Inability: Not on file  . Transportation needs    Medical: Not on file    Non-medical: Not on file  Tobacco Use  . Smoking status: Never Smoker  . Smokeless tobacco: Never Used  Substance and Sexual Activity  . Alcohol use: Yes    Alcohol/week: 6.0 standard drinks    Types: 6 Cans of beer per week  . Drug use: Not on file  . Sexual activity: Not on file  Lifestyle  . Physical activity    Days per week: Not on file    Minutes per session: Not on file  . Stress: Not on file  Relationships  . Social Musician on phone: Not on file    Gets together: Not on file    Attends religious service: Not on file    Active member of club or organization: Not on file    Attends meetings of clubs or organizations: Not on file    Relationship status: Not on file  . Intimate partner violence    Fear of current or ex partner: Not on  file    Emotionally abused: Not on file    Physically abused: Not on file    Forced sexual activity: Not on file  Other Topics Concern  . 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  FAMILY HISTORY:   Family Status  Relation Name Status  . Father  Deceased       died from trauma  . Mother  Deceased  . Sister  Alive  . Brother  Alive  . Child 2 step children Alive    ROS:  Review of Systems  Constitutional: Negative.   HENT: Negative.   Eyes: Negative.   Respiratory: Negative.   Cardiovascular: Negative.   Gastrointestinal: Negative.   Genitourinary: Negative.   Musculoskeletal: Negative.   Skin: Negative.   Endo/Heme/Allergies: Negative.     PHYSICAL EXAMINATION:    VITALS:   Vitals:   07/21/19 0850  BP: 115/74  Pulse: 82  SpO2: 96%  Weight: 209 lb (94.8 kg)  Height: 5\' 10"  (1.778 m)    GEN:  The patient appears stated age and is in NAD.  He is somewhat anxious. HEENT:  Normocephalic, atraumatic.  The mucous membranes are moist. The superficial temporal arteries are without ropiness or tenderness. CV:  RRR Lungs:  CTAB Neck/HEME:  There are no carotid bruits bilaterally.  Neurological examination:  Orientation: The patient is alert and oriented x3. Fund of knowledge is appropriate.  Recent and remote memory are intact.  Attention and concentration are normal.     Cranial nerves: There is good facial symmetry.  Extraocular muscles are intact. The visual fields are full to confrontational testing. The speech is fluent and clear. Soft palate rises symmetrically and there is no tongue deviation. Hearing is intact to conversational tone. Sensation: Sensation is intact to light and pinprick throughout (facial, trunk, extremities). Vibration is intact at the bilateral big toe. There is no extinction with double simultaneous stimulation. There is no sensory dermatomal level identified. Motor:  Strength is 5/5 in the bilateral upper and lower extremities (initially has some giveaway weakness in the lower extremities, but it does improve with encouragement).   Shoulder shrug is equal and symmetric.  There is no pronator drift. Deep tendon reflexes: Deep tendon reflexes are 2/4 at the bilateral biceps, triceps, brachioradialis, patella and achilles. Plantar responses are downgoing bilaterally.  Movement examination: Tone: There is normal tone in the bilateral upper extremities.  The tone in the lower extremities is normal.  Abnormal movements: No rest tremor.  No postural tremor.  No intention tremor.  No evidence of tremor with Archimedes spirals.  He is able to pour water from one glass to another without spilling it. Coordination:  There is no decremation with RAM's, with any form of RAMS, including alternating supination and pronation of the forearm, hand opening and closing, finger taps, heel taps and toe taps.  He is slow and purposeful with finger nose both with eyes open and closed Gait and Station: The patient has no difficulty arising out of a deep-seated chair without the use of the hands. The patient's stride length is normal with good stride length.  He walks well in the home.  When asked to walk in a tandem fashion, he does it for a few steps, and then states that he just cannot do it.  He is able to stand in the Romberg position with eyes open and closed.    Labs: Patient brought lab work with him.  Most recent lab work was dated July 14, 2019.  TSH was 3.170.  Lithium level was 1.1.  Patient also had lab work on May 14, 2019.  White blood cells were 6.4, hemoglobin 14.8, hematocrit 44.9 and platelets 152.  Sodium was 140, potassium 4.7, chloride 100,  CO2 24, BUN 13, creatinine 1.05, AST 22, ALT 19, alkaline phosphatase 48.  ASSESSMENT/PLAN:  1.  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  His wife notes that he has actually been better over the last 3 days.  I suspect that this will continue to get better.  At this point in time, I would not recommend medication for it, but told him to give it at least another month to see how he does.  It is important to note that today I did not notice any tremor on examination.  2.  Gait instability  -Patient and wife have both noticed some minor changes in gait since he was sick with COVID.  He looked pretty good to me today, but given his history of slightly abnormal MRI of the brain and the fact that this seemed to get worse when he was sick with COVID in August, we decided to go ahead and do an MRI of the brain.  -b12, folate, spep/upep with immunofixation -they took the labs with them and are going to have [place] done in [place].  3.  F/u will depend on the above.  Much greater than 50% of this visit was spent in counseling and coordinating care.  Total face to face time:  45 min   Cc:  Abigail MiyamotoPerry, Lawrence Edward, MD

## 2019-07-21 ENCOUNTER — Ambulatory Visit: Payer: No Typology Code available for payment source | Admitting: Neurology

## 2019-07-21 ENCOUNTER — Encounter: Payer: Self-pay | Admitting: Neurology

## 2019-07-21 ENCOUNTER — Other Ambulatory Visit: Payer: Self-pay

## 2019-07-21 VITALS — BP 115/74 | HR 82 | Ht 70.0 in | Wt 209.0 lb

## 2019-07-21 DIAGNOSIS — R9402 Abnormal brain scan: Secondary | ICD-10-CM | POA: Diagnosis not present

## 2019-07-21 DIAGNOSIS — R2681 Unsteadiness on feet: Secondary | ICD-10-CM | POA: Diagnosis not present

## 2019-07-21 DIAGNOSIS — R413 Other amnesia: Secondary | ICD-10-CM | POA: Diagnosis not present

## 2019-07-21 DIAGNOSIS — G251 Drug-induced tremor: Secondary | ICD-10-CM

## 2019-07-21 NOTE — Patient Instructions (Addendum)
A referral to [facility] has been placed for your MRI someone will contact you directly to schedule your appt. They are located at [place]. Please contact them directly by calling [phone] with any questions regarding your referral.  Lab orders printed, please have lab fax results when complete to out office [phone]

## 2019-08-05 ENCOUNTER — Other Ambulatory Visit: Payer: No Typology Code available for payment source

## 2019-08-09 ENCOUNTER — Other Ambulatory Visit: Payer: Self-pay

## 2019-08-09 ENCOUNTER — Ambulatory Visit
Admission: RE | Admit: 2019-08-09 | Discharge: 2019-08-09 | Disposition: A | Discharge status: Home / Self Care | Payer: No Typology Code available for payment source | Source: Ambulatory Visit | Attending: Neurology | Admitting: Neurology

## 2019-08-09 DIAGNOSIS — R2681 Unsteadiness on feet: Secondary | ICD-10-CM

## 2019-08-09 DIAGNOSIS — R413 Other amnesia: Secondary | ICD-10-CM

## 2019-08-09 IMAGING — MR MR HEAD W/O CM
10 series · 48 of 48 positions shown · non-contrast
Comparison: [DATE] MRI. CT [DATE].

CLINICAL DATA: Memory loss and gait disturbance over the last 2
months.

EXAM:
MRI HEAD WITHOUT CONTRAST
TECHNIQUE: Multiplanar, multiecho pulse sequences of the brain and surrounding
structures were obtained without intravenous contrast.

[Series 5: T1 · sagittal · 4.0mm · 0.75mm/px · 2 of 29 slices shown (1 of 2)]
[im 1/29]
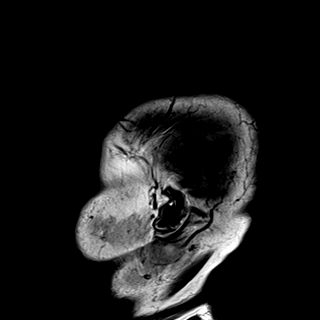
[im 29/29]
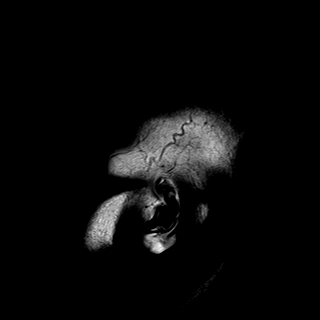

[Series 6: T2 · axial · 4.0mm · 0.36mm/px · z∈[-66,+75]mm · 2 of 28 slices shown (1 of 2)]
[im 1/28]
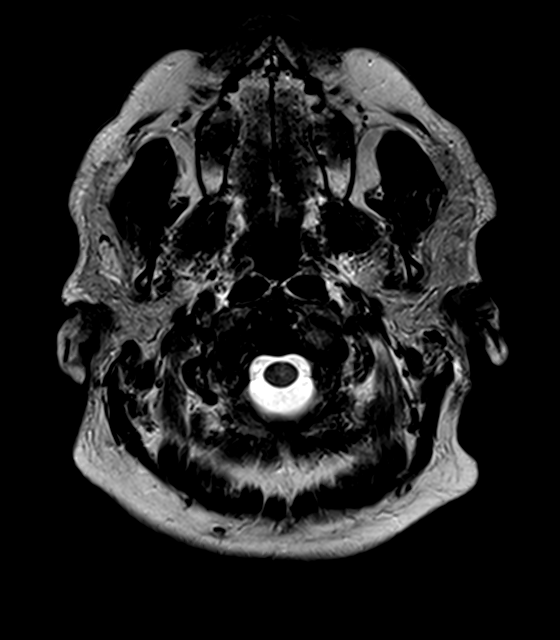
[im 28/28]
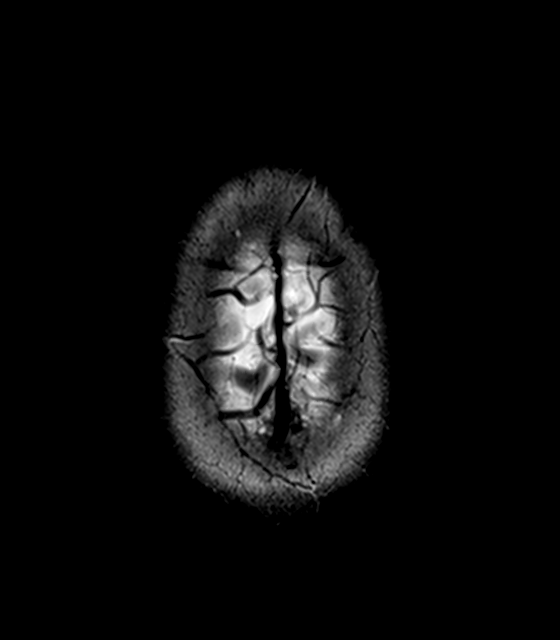

[Series 7: DWI · axial · 3.0mm · 1.44mm/px · z∈[-67,+75]mm · 8 of 88 slices shown (1 of 4)]
[im 1/88]
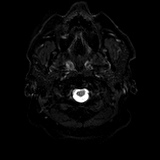
[im 13/88]
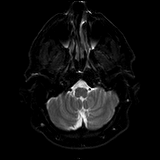
[im 25/88]
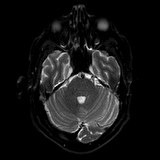
[im 38/88]
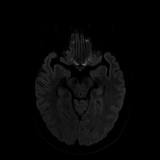
[im 50/88]
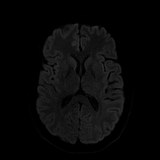
[im 63/88]
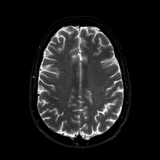
[im 75/88]
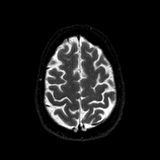
[im 88/88]
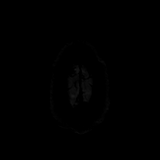

[Series 8: DWI · axial · 3.0mm · 1.44mm/px · z∈[-67,+75]mm · 4 of 44 slices shown (2 of 4)]
[im 1/44]
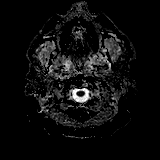
[im 15/44]
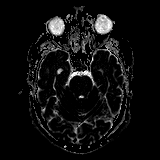
[im 29/44]
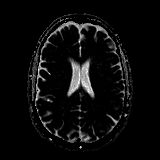
[im 44/44]
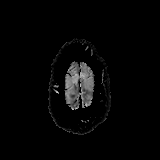

[Series 9: DWI · coronal · 5.0mm · 1.44mm/px · 6 of 64 slices shown (3 of 4)]
[im 1/64]
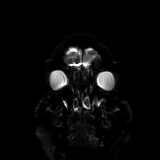
[im 13/64]
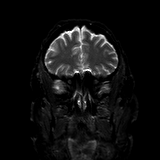
[im 26/64]
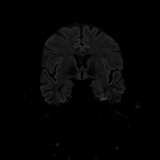
[im 38/64]
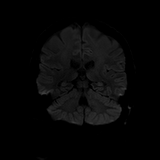
[im 51/64]
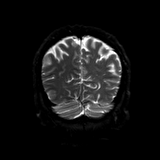
[im 64/64]
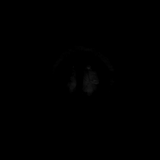

[Series 10: DWI · coronal · 5.0mm · 1.44mm/px · 3 of 32 slices shown (4 of 4)]
[im 1/32]
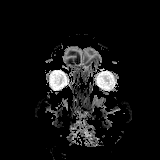
[im 16/32]
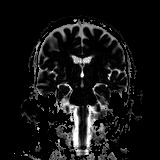
[im 32/32]
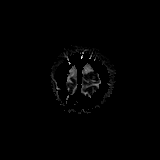

[Series 12: swi_images · axial · 4.0mm · 0.90mm/px · z∈[-73,+83]mm · 4 of 40 slices shown]
[im 1/40]
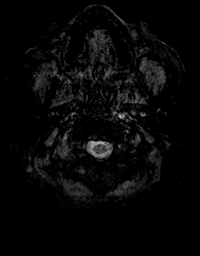
[im 14/40]
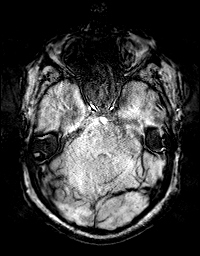
[im 27/40]
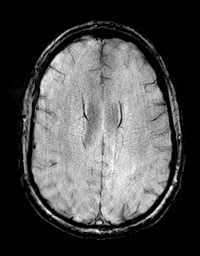
[im 40/40]
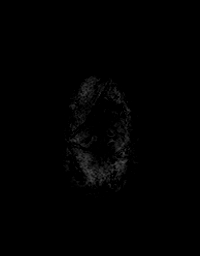

[Series 13: FLAIR · axial · 3.0mm · 0.72mm/px · z∈[-71,+79]mm · 2 of 26 slices shown]
[im 1/26]
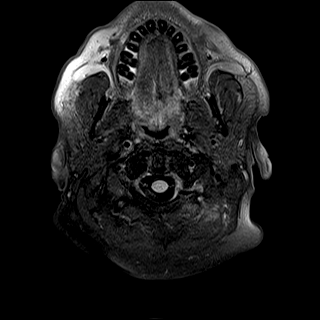
[im 26/26]
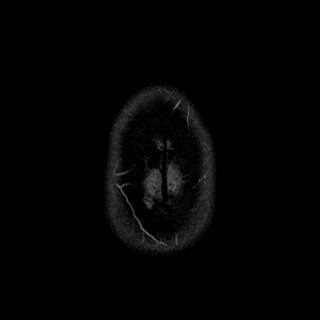

[Series 14: T1 · axial · 1.0mm · 0.94mm/px · z∈[-75,+84]mm · 14 of 160 slices shown (2 of 2)]
[im 1/160]
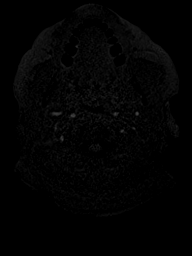
[im 13/160]
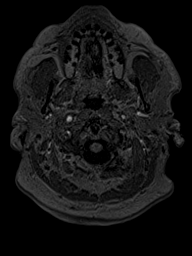
[im 25/160]
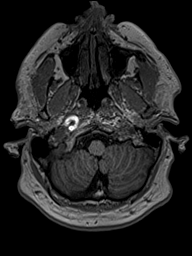
[im 37/160]
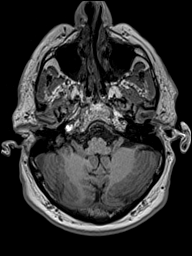
[im 49/160]
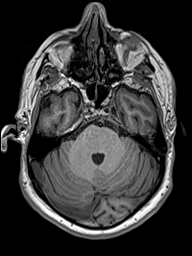
[im 62/160]
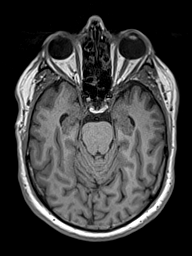
[im 74/160]
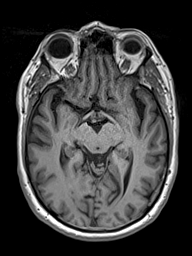
[im 86/160]
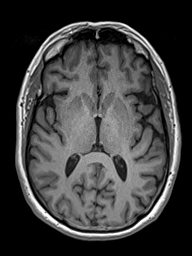
[im 98/160]
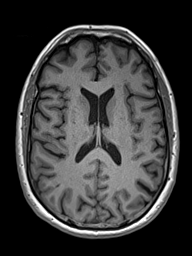
[im 111/160]
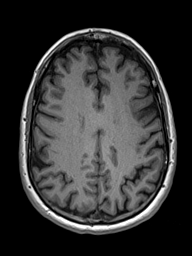
[im 123/160]
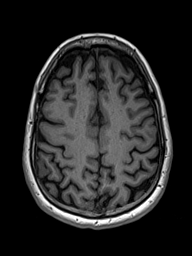
[im 135/160]
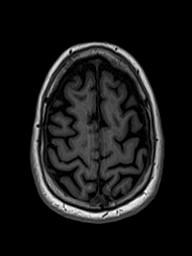
[im 147/160]
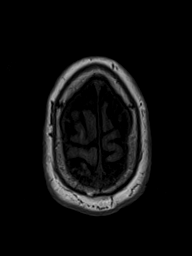
[im 160/160]
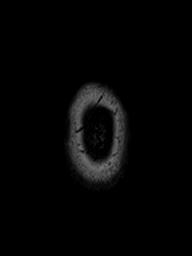

[Series 15: T2 · coronal · 4.5mm · 0.36mm/px · 3 of 30 slices shown (2 of 2)]
[im 1/30]
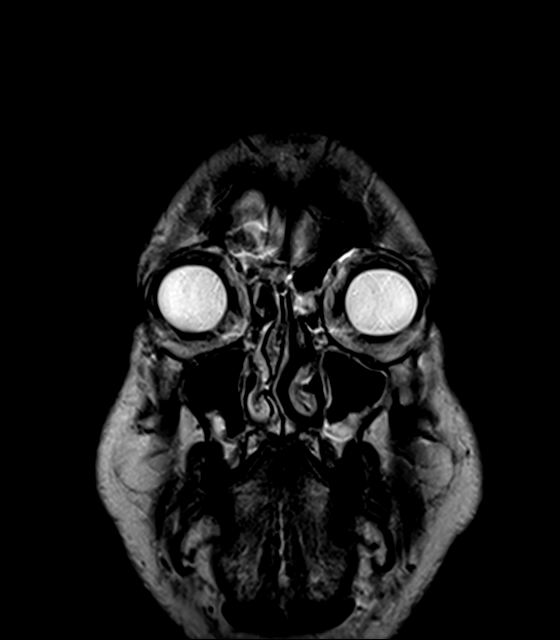
[im 15/30]
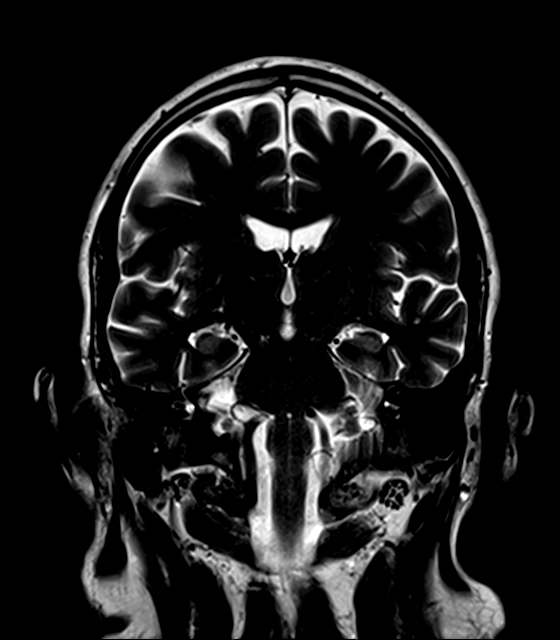
[im 30/30]
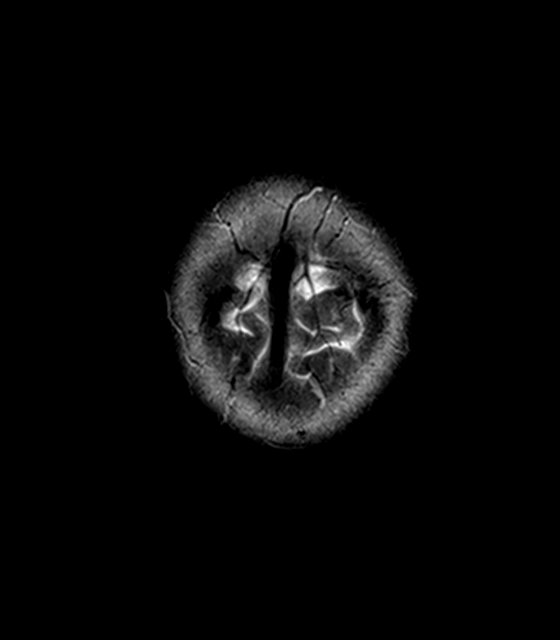

[48 of 48 positions shown; findings below may reference images not displayed]

FINDINGS: Brain: The brain has a normal appearance without evidence of
malformation, atrophy, old or acute small or large vessel
infarction, mass lesion, hemorrhage, hydrocephalus or extra-axial
collection. Previously seen restricted diffusion and T2 abnormality
within the splenium of the corpus callosum has resolved without
residua. No other focal finding.

Vascular: Major vessels at the base of the brain show flow. Venous
sinuses appear patent.

Skull and upper cervical spine: Normal.

Sinuses/Orbits: Mucosal edema of the paranasal sinuses with small
maxillary sinus air-fluid levels consistent with rhinosinusitis.
Orbits negative. No fluid in the middle ears or mastoids.

Other: None significant.
IMPRESSION: 1. Normal appearance of the brain today. Previously seen restricted
diffusion and T2 abnormality within the splenium of the corpus
callosum has resolved without residua.
2. Mucosal edema of the paranasal sinuses with small maxillary sinus
air-fluid levels consistent with rhinosinusitis.

## 2019-08-10 ENCOUNTER — Telehealth: Payer: Self-pay | Admitting: Neurology

## 2019-08-10 NOTE — Telephone Encounter (Signed)
Let pt know that MRI brain is normal.  There is evidence of sinusitis but that is out of my area of expertise and he can f/u with PCP on that

## 2019-08-10 NOTE — Telephone Encounter (Signed)
Pt advised of mri results

## 2019-08-11 MED FILL — BENAZEPRIL HCL 20 MG TABLET: 20 | 90 days supply | Qty: 90 | Fill #0

## 2019-08-17 ENCOUNTER — Telehealth: Payer: Self-pay | Admitting: Neurology

## 2019-08-17 NOTE — Telephone Encounter (Signed)
Fax number given to patient and he is faxing the results today

## 2019-08-17 NOTE — Telephone Encounter (Signed)
Patient did fax labs.  There was no M spike in the SPEP.  There was no M spike in the UPEP.  B12 was 581.  Folate was 18.1.

## 2019-08-17 NOTE — Telephone Encounter (Signed)
Can you call pt and find out if he had labs done that we gave him?  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  Thanks!

## 2019-08-24 MED FILL — LIDOCAINE 2% VISCOUS SOLN: 2 | 1 days supply | Qty: 100 | Fill #0

## 2019-09-22 MED FILL — clonazePAM 1 MG TABS: 1 | 30 days supply | Qty: 60 | Fill #0

## 2019-10-18 ENCOUNTER — Emergency Department (HOSPITAL_COMMUNITY)
Admission: EM | Admit: 2019-10-18 | Discharge: 2019-10-18 | Discharge status: Absent without leave | Payer: No Typology Code available for payment source

## 2019-10-18 NOTE — ED Notes (Signed)
Pt left, per Anell Barr.

## 2019-10-19 MED FILL — LITHIUM CARBONATE ER 300 MG: 300 | 90 days supply | Qty: 180 | Fill #2

## 2019-10-19 MED FILL — DULOXETINE HCL 60 MG CPEP: 60 | 90 days supply | Qty: 180 | Fill #2

## 2019-10-20 MED FILL — METOPROLOL SUCCINATE ER 50: 50 | 90 days supply | Qty: 90 | Fill #2

## 2019-10-26 ENCOUNTER — Encounter: Payer: Self-pay | Admitting: Cardiology

## 2019-10-26 ENCOUNTER — Ambulatory Visit (INDEPENDENT_AMBULATORY_CARE_PROVIDER_SITE_OTHER): Payer: No Typology Code available for payment source | Admitting: Cardiology

## 2019-10-26 ENCOUNTER — Other Ambulatory Visit: Payer: Self-pay

## 2019-10-26 VITALS — BP 158/104 | HR 68 | Ht 70.0 in | Wt 219.0 lb

## 2019-10-26 DIAGNOSIS — I10 Essential (primary) hypertension: Secondary | ICD-10-CM

## 2019-10-26 DIAGNOSIS — E7800 Pure hypercholesterolemia, unspecified: Secondary | ICD-10-CM | POA: Diagnosis not present

## 2019-10-26 DIAGNOSIS — I209 Angina pectoris, unspecified: Secondary | ICD-10-CM | POA: Diagnosis not present

## 2019-10-26 DIAGNOSIS — I2510 Atherosclerotic heart disease of native coronary artery without angina pectoris: Secondary | ICD-10-CM | POA: Diagnosis not present

## 2019-10-26 HISTORY — DX: Angina pectoris, unspecified: I20.9

## 2019-10-26 NOTE — H&P (View-Only) (Signed)
Cardiology Office Note:    Date:  10/26/2019   ID:  Jon Gonzales, DOB 02-23-1961, MRN [id number]  PCP:  [name], MD  Cardiologist:  Garwin Brothers, MD   Referring MD: [name],*    ASSESSMENT:    1. Angina pectoris (HCC)   2. Coronary artery disease, angina presence unspecified, unspecified vessel or lesion type, unspecified whether native or transplanted heart   3. Hypercholesterolemia   4. Essential hypertension    PLAN:    In order of problems listed above:  1. Coronary artery disease and angina pectoris: Patient symptoms are very concerning and I discussed this with him at extensive length.  The following plan was discussed.In view of the patient's symptoms, I discussed with the patient options for evaluation. Invasive and noninvasive options were given to the patient. I discussed stress testing and coronary angiography and left heart catheterization at length. Benefits, pros and cons of each approach were discussed at length. Patient had multiple questions which were answered to the patient's satisfaction. Patient opted for invasive evaluation and we will set up for coronary angiography and left heart catheterization. Further recommendations will be made based on the findings with coronary angiography. In the interim if the patient has any significant symptoms in hospital to the nearest emergency room. 2. Sublingual nitroglycerin prescription was sent, its protocol and 911 protocol explained and the patient vocalized understanding questions were answered to the patient's satisfaction 3. Essential hypertension: Blood pressure is elevated.  He will send [place] a copy of medications he is taking as he does not know it very clearly.  We will await this.  Some of this elevation in blood pressure is obviously due to stress and whitecoat hypertension of the new symptom profile that he has now developed. 4. Mixed dyslipidemia: Lipids managed by his primary care  physician and diet was discussed.  He will be seen in follow-up appointment after the coronary angiography.  He knows to go to the nearest emergency room for any concerning symptoms.  Had multiple questions which were answered to his satisfaction.   Medication Adjustments/Labs and Tests Ordered: Current medicines are reviewed at length with the patient today.  Concerns regarding medicines are outlined above.  Orders Placed This Encounter  Procedures  . EKG 12-Lead   No orders of the defined types were placed in this encounter.    History of Present Illness:    Jon Gonzales is a 59 y.o. male who is being seen today for the evaluation of dyspnea on exertion and chest tightness at the request of [name],*.  Patient is a pleasant 59 year old male.  This patient has been under my care in my previous practice.  He is here now to transfer his care and be established with my current practice.  He is undergoing CABG surgery in 2013.  Subsequently he has not followed up with me especially in this new practice.  He mentions to me that he has not seen a cardiologist for a long time.  He complains of dyspnea on exertion and chest tightness.  He leads a sedentary lifestyle.  Anytime he tries to do something he gets out of breath more than usual and this is new for him.  At the time of my evaluation, the patient is alert awake oriented and in no distress.  He leads a sedentary lifestyle and does not exercise on a regular basis.  At the time of my evaluation, the patient is alert awake oriented  and in no distress.  He and his primary care physician are very concerned about his symptoms and therefore he came to see me.  I reviewed records from primary care physician from this morning.  Past Medical History:  Diagnosis Date  . CAD (coronary artery disease)    Cath 2013 LAD 95% stenosis, D1 90% stenosis, RCA 20%, Circ 30%.  Previous stent to D1 2012.   EF 60%.    . Depression   . HTN  (hypertension)   . Hyperlipidemia   . Low testosterone   . Nephrolithiasis   . PUD (peptic ulcer disease)   . TIA (transient ischemic attack)     Past Surgical History:  Procedure Laterality Date  . CHOLECYSTECTOMY    . CORONARY ARTERY BYPASS GRAFT  02/19/12   L - LAD, SVG - D1, SVG - OM  . TONSILLECTOMY AND ADENOIDECTOMY    . WRIST SURGERY      Current Medications: Current Meds  Medication Sig  . benazepril (LOTENSIN) 20 MG tablet Take by mouth.  . CRESTOR 10 MG tablet   . DULoxetine (CYMBALTA) 60 MG capsule   . lithium carbonate (LITHOBID) 300 MG CR tablet   . LORazepam (ATIVAN) 1 MG tablet Take 1 mg by mouth every 8 (eight) hours.  . metoprolol succinate (TOPROL-XL) 50 MG 24 hr tablet Take 50 mg by mouth daily. Take with or immediately following a meal.  . nitroGLYCERIN (NITROSTAT) 0.4 MG SL tablet Place 0.4 mg under the tongue every 5 (five) minutes as needed for chest pain.  Marland Kitchen omeprazole (PRILOSEC) 20 MG capsule   . [DISCONTINUED] cloNIDine (CATAPRES) 0.1 MG tablet   . [DISCONTINUED] dexamethasone (DECADRON) 1.5 MG tablet TAKE 6 TABLETS ON DAY 1 AS DIRECTED ON PACKAGE AND DECREASE BY 1 TAB EACH DAY FOR A TOTAL OF 6 DAYS  . [DISCONTINUED] Eszopiclone 3 MG TABS Take by mouth.  . [DISCONTINUED] sulfamethoxazole-trimethoprim (BACTRIM DS) 800-160 MG tablet TAKE 1 TABLET BY ORAL ROUTE TIMES PER DAY  . [DISCONTINUED] testosterone cypionate (DEPOTESTOSTERONE CYPIONATE) 200 MG/ML injection Inject into the muscle.     Allergies:   Penicillins   Social History   Socioeconomic History  . Marital status: Married    Spouse name: Not on file  . Number of children: Not on file  . Years of education: Not on file  . Highest education level: Bachelor's degree (e.g., BA, AB, BS)  Occupational History  . Not on file  Tobacco Use  . Smoking status: Never Smoker  . Smokeless tobacco: Never Used  Substance and Sexual Activity  . Alcohol use: Yes    Alcohol/week: 6.0 standard drinks     Types: 6 Cans of beer per week  . Drug use: Not on file  . Sexual activity: Not on file  Other Topics Concern  . 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     Social Determinants of Health   Financial Resource Strain:   . Difficulty of Paying Living Expenses: Not on file  Food Insecurity:   . Worried About Charity fundraiser in the Last Year: Not on file  . Ran Out of Food in the Last Year: Not on file  Transportation Needs:   . Lack of Transportation (Medical): Not on file  . Lack of Transportation (Non-Medical): Not on file  Physical Activity:   . Days of Exercise per Week: Not on file  .  Minutes of Exercise per Session: Not on file  Stress:   . Feeling of Stress : Not on file  Social Connections:   . Frequency of Communication with Friends and Family: Not on file  . Frequency of Social Gatherings with Friends and Family: Not on file  . Attends Religious Services: Not on file  . Active Member of Clubs or Organizations: Not on file  . Attends Club or Organization Meetings: Not on file  . Marital Status: Not on file     Family History: The patient's family history includes Aneurysm (age of onset: 46) in his mother; CAD (age of onset: 50) in his father; Healthy in his brother; Heart disease in his sister.  ROS:   Please see the history of present illness.    All other systems reviewed and are negative.  EKGs/Labs/Other Studies Reviewed:    The following studies were reviewed today: EKG reveals sinus rhythm and nonspecific ST-T changes.   Recent Labs: No results found for requested labs within last 8760 hours.  Recent Lipid Panel No results found for: CHOL, TRIG, HDL, CHOLHDL, VLDL, LDLCALC, LDLDIRECT  Physical Exam:    VS:  BP (!) 158/104 (BP Location: Left Arm, Patient Position: Sitting, Cuff Size: Normal)   Pulse 68   Ht 5' 10" (1.778 m)   Wt 219 lb (99.3 kg)   SpO2 95%   BMI 31.42  kg/m     Wt Readings from Last 3 Encounters:  10/26/19 219 lb (99.3 kg)  07/21/19 209 lb (94.8 kg)  04/11/17 187 lb 8 oz (85 kg)     GEN: Patient is in no acute distress HEENT: Normal NECK: No JVD; No carotid bruits LYMPHATICS: No lymphadenopathy CARDIAC: S1 S2 regular, 2/6 systolic murmur at the apex. RESPIRATORY:  Clear to auscultation without rales, wheezing or rhonchi  ABDOMEN: Soft, non-tender, non-distended MUSCULOSKELETAL:  No edema; No deformity  SKIN: Warm and dry NEUROLOGIC:  Alert and oriented x 3 PSYCHIATRIC:  Normal affect    Signed, Eyoel Throgmorton R Jassen Sarver, MD  10/26/2019 10:02 AM    [facility]   

## 2019-10-26 NOTE — Progress Notes (Signed)
Cardiology Office Note:    Date:  10/26/2019   ID:  [name], DOB 02-23-1961, MRN [id number]  PCP:  [name], MD  Cardiologist:  Garwin Brothers, MD   Referring MD: [name],*    ASSESSMENT:    1. Angina pectoris (HCC)   2. Coronary artery disease, angina presence unspecified, unspecified vessel or lesion type, unspecified whether native or transplanted heart   3. Hypercholesterolemia   4. Essential hypertension    PLAN:    In order of problems listed above:  1. Coronary artery disease and angina pectoris: Patient symptoms are very concerning and I discussed this with him at extensive length.  The following plan was discussed.In view of the patient's symptoms, I discussed with the patient options for evaluation. Invasive and noninvasive options were given to the patient. I discussed stress testing and coronary angiography and left heart catheterization at length. Benefits, pros and cons of each approach were discussed at length. Patient had multiple questions which were answered to the patient's satisfaction. Patient opted for invasive evaluation and we will set up for coronary angiography and left heart catheterization. Further recommendations will be made based on the findings with coronary angiography. In the interim if the patient has any significant symptoms in hospital to the nearest emergency room. 2. Sublingual nitroglycerin prescription was sent, its protocol and 911 protocol explained and the patient vocalized understanding questions were answered to the patient's satisfaction 3. Essential hypertension: Blood pressure is elevated.  He will send [place] a copy of medications he is taking as he does not know it very clearly.  We will await this.  Some of this elevation in blood pressure is obviously due to stress and whitecoat hypertension of the new symptom profile that he has now developed. 4. Mixed dyslipidemia: Lipids managed by his primary care  physician and diet was discussed.  He will be seen in follow-up appointment after the coronary angiography.  He knows to go to the nearest emergency room for any concerning symptoms.  Had multiple questions which were answered to his satisfaction.   Medication Adjustments/Labs and Tests Ordered: Current medicines are reviewed at length with the patient today.  Concerns regarding medicines are outlined above.  Orders Placed This Encounter  Procedures  . EKG 12-Lead   No orders of the defined types were placed in this encounter.    History of Present Illness:    Jon Gonzales is a 59 y.o. male who is being seen today for the evaluation of dyspnea on exertion and chest tightness at the request of [name],*.  Patient is a pleasant 59 year old male.  This patient has been under my care in my previous practice.  He is here now to transfer his care and be established with my current practice.  He is undergoing CABG surgery in 2013.  Subsequently he has not followed up with me especially in this new practice.  He mentions to me that he has not seen a cardiologist for a long time.  He complains of dyspnea on exertion and chest tightness.  He leads a sedentary lifestyle.  Anytime he tries to do something he gets out of breath more than usual and this is new for him.  At the time of my evaluation, the patient is alert awake oriented and in no distress.  He leads a sedentary lifestyle and does not exercise on a regular basis.  At the time of my evaluation, the patient is alert awake oriented  and in no distress.  He and his primary care physician are very concerned about his symptoms and therefore he came to see me.  I reviewed records from primary care physician from this morning.  Past Medical History:  Diagnosis Date  . CAD (coronary artery disease)    Cath 2013 LAD 95% stenosis, D1 90% stenosis, RCA 20%, Circ 30%.  Previous stent to D1 2012.   EF 60%.    . Depression   . HTN  (hypertension)   . Hyperlipidemia   . Low testosterone   . Nephrolithiasis   . PUD (peptic ulcer disease)   . TIA (transient ischemic attack)     Past Surgical History:  Procedure Laterality Date  . CHOLECYSTECTOMY    . CORONARY ARTERY BYPASS GRAFT  02/19/12   L - LAD, SVG - D1, SVG - OM  . TONSILLECTOMY AND ADENOIDECTOMY    . WRIST SURGERY      Current Medications: Current Meds  Medication Sig  . benazepril (LOTENSIN) 20 MG tablet Take by mouth.  . CRESTOR 10 MG tablet   . DULoxetine (CYMBALTA) 60 MG capsule   . lithium carbonate (LITHOBID) 300 MG CR tablet   . LORazepam (ATIVAN) 1 MG tablet Take 1 mg by mouth every 8 (eight) hours.  . metoprolol succinate (TOPROL-XL) 50 MG 24 hr tablet Take 50 mg by mouth daily. Take with or immediately following a meal.  . nitroGLYCERIN (NITROSTAT) 0.4 MG SL tablet Place 0.4 mg under the tongue every 5 (five) minutes as needed for chest pain.  Marland Kitchen omeprazole (PRILOSEC) 20 MG capsule   . [DISCONTINUED] cloNIDine (CATAPRES) 0.1 MG tablet   . [DISCONTINUED] dexamethasone (DECADRON) 1.5 MG tablet TAKE 6 TABLETS ON DAY 1 AS DIRECTED ON PACKAGE AND DECREASE BY 1 TAB EACH DAY FOR A TOTAL OF 6 DAYS  . [DISCONTINUED] Eszopiclone 3 MG TABS Take by mouth.  . [DISCONTINUED] sulfamethoxazole-trimethoprim (BACTRIM DS) 800-160 MG tablet TAKE 1 TABLET BY ORAL ROUTE TIMES PER DAY  . [DISCONTINUED] testosterone cypionate (DEPOTESTOSTERONE CYPIONATE) 200 MG/ML injection Inject into the muscle.     Allergies:   Penicillins   Social History   Socioeconomic History  . Marital status: Married    Spouse name: Not on file  . Number of children: Not on file  . Years of education: Not on file  . Highest education level: Bachelor's degree (e.g., BA, AB, BS)  Occupational History  . Not on file  Tobacco Use  . Smoking status: Never Smoker  . Smokeless tobacco: Never Used  Substance and Sexual Activity  . Alcohol use: Yes    Alcohol/week: 6.0 standard drinks     Types: 6 Cans of beer per week  . Drug use: Not on file  . Sexual activity: Not on file  Other Topics Concern  . 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     Social Determinants of Health   Financial Resource Strain:   . Difficulty of Paying Living Expenses: Not on file  Food Insecurity:   . Worried About Charity fundraiser in the Last Year: Not on file  . Ran Out of Food in the Last Year: Not on file  Transportation Needs:   . Lack of Transportation (Medical): Not on file  . Lack of Transportation (Non-Medical): Not on file  Physical Activity:   . Days of Exercise per Week: Not on file  .  Minutes of Exercise per Session: Not on file  Stress:   . Feeling of Stress : Not on file  Social Connections:   . Frequency of Communication with Friends and Family: Not on file  . Frequency of Social Gatherings with Friends and Family: Not on file  . Attends Religious Services: Not on file  . Active Member of Clubs or Organizations: Not on file  . Attends Banker Meetings: Not on file  . Marital Status: Not on file     Family History: The patient's family history includes Aneurysm (age of onset: 62) in his mother; CAD (age of onset: 33) in his father; Healthy in his brother; Heart disease in his sister.  ROS:   Please see the history of present illness.    All other systems reviewed and are negative.  EKGs/Labs/Other Studies Reviewed:    The following studies were reviewed today: EKG reveals sinus rhythm and nonspecific ST-T changes.   Recent Labs: No results found for requested labs within last 8760 hours.  Recent Lipid Panel No results found for: CHOL, TRIG, HDL, CHOLHDL, VLDL, LDLCALC, LDLDIRECT  Physical Exam:    VS:  BP (!) 158/104 (BP Location: Left Arm, Patient Position: Sitting, Cuff Size: Normal)   Pulse 68   Ht 5\' 10"  (1.778 m)   Wt 219 lb (99.3 kg)   SpO2 95%   BMI 31.42  kg/m     Wt Readings from Last 3 Encounters:  10/26/19 219 lb (99.3 kg)  07/21/19 209 lb (94.8 kg)  04/11/17 187 lb 8 oz (85 kg)     GEN: Patient is in no acute distress HEENT: Normal NECK: No JVD; No carotid bruits LYMPHATICS: No lymphadenopathy CARDIAC: S1 S2 regular, 2/6 systolic murmur at the apex. RESPIRATORY:  Clear to auscultation without rales, wheezing or rhonchi  ABDOMEN: Soft, non-tender, non-distended MUSCULOSKELETAL:  No edema; No deformity  SKIN: Warm and dry NEUROLOGIC:  Alert and oriented x 3 PSYCHIATRIC:  Normal affect    Signed, 04/13/17, MD  10/26/2019 10:02 AM    [facility]

## 2019-10-26 NOTE — Addendum Note (Signed)
Addended by: Pamala Hurry on: 10/26/2019 10:24 AM   Modules accepted: Orders

## 2019-10-26 NOTE — Patient Instructions (Signed)
Medication Instructions:  Your physician recommends that you continue on your current medications as directed. Please refer to the Current Medication list given to you today. *If you need a refill on your cardiac medications before your next appointment, please call your pharmacy*  Lab Work: NONE If you have labs (blood work) drawn today and your tests are completely normal, you will receive your results only by: Marland Kitchen MyChart Message (if you have MyChart) OR . A paper copy in the mail If you have any lab test that is abnormal or we need to change your treatment, we will call you to review the results.  Testing/Procedures: You had an EKG Performed today  A chest x-ray takes a picture of the organs and structures inside the chest, including the heart, lungs, and blood vessels. This test can show several things, including, whether the heart is enlarges; whether fluid is building up in the lungs; and whether pacemaker / defibrillator leads are still in place.  Your physician has requested that you have a cardiac catheterization. Cardiac catheterization is used to diagnose and/or treat various heart conditions. Doctors may recommend this procedure for a number of different reasons. The most common reason is to evaluate chest pain. Chest pain can be a symptom of coronary artery disease (CAD), and cardiac catheterization can show whether plaque is narrowing or blocking your heart's arteries. This procedure is also used to evaluate the valves, as well as measure the blood flow and oxygen levels in different parts of your heart. For further information please visit [email or web address] Please follow instruction sheet, as given.  YOU ARE SCHEDULED for cvd19 screening tomorrow 11/02/2019 at 1055 am at [address], [place], [place]. You will need to get in the prescreening line     [facility] CARDIOVASCULAR DIVISION [facility] HEARTCARE AT [facility] [address] [place]  [address] Dept: [phone] Loc: [phone]  Jon Gonzales  10/26/2019  You are scheduled for a Cardiac Catheterization on Friday, January 21 with Dr. Bryan Lemma.  1. Please arrive at the [facility] (Main Entrance A) at [facility]: [address] [place], [place] [address] at 5:30 AM (This time is two hours before your procedure to ensure your preparation). Free valet parking service is available.   Special note: Every effort is made to have your procedure done on time. Please understand that emergencies sometimes delay scheduled procedures.  2. Diet: Do not eat solid foods after midnight.  The patient may have clear liquids until 5am upon the day of the procedure.  3. Labs: NONE  4. Medication instructions in preparation for your procedure:   Contrast Allergy: No  Stop taking, Benazapril Wednesday, January 20,   On the morning of your procedure, take your Aspirin and any morning medicines NOT listed above.  You may use sips of water.  5. Plan for one night stay--bring personal belongings. 6. Bring a current list of your medications and current insurance cards. 7. You MUST have a responsible person to drive you home. 8. Someone MUST be with you the first 24 hours after you arrive home or your discharge will be delayed. 9. Please wear clothes that are easy to get on and off and wear slip-on shoes.  Thank you for allowing [place] to care for you!   -- [facility]    Follow-Up: At [facility], you and your health needs are our priority.  As part of our continuing mission to provide you  with exceptional heart care, we have created designated Provider Care Teams.  These Care Teams include your primary Cardiologist (physician) and Advanced Practice Providers (APPs -  Physician Assistants and Nurse Practitioners) who all work together to provide you with the care you need, when you need it.  Your next appointment:   1 month(s)  The  format for your next appointment:   In Person  Provider:   Jyl Heinz, MD  Other Instructions  Coronary Angiogram With Stent Coronary angiogram with stent placement is a procedure to widen or open a narrow blood vessel of the heart (coronary artery). Arteries may become blocked by cholesterol buildup (plaques) in the lining of the artery wall. When a coronary artery becomes partially blocked, blood flow to that area decreases. This may lead to chest pain or a heart attack (myocardial infarction). A stent is a small piece of metal that looks like mesh or spring. Stent placement may be done as treatment after a heart attack, or to prevent a heart attack if a blocked artery is found by a coronary angiogram. Let your health care provider know about:  Any allergies you have, including allergies to medicines or contrast dye.  All medicines you are taking, including vitamins, herbs, eye drops, creams, and over-the-counter medicines.  Any problems you or family members have had with anesthetic medicines.  Any blood disorders you have.  Any surgeries you have had.  Any medical conditions you have, including kidney problems or kidney failure.  Whether you are pregnant or may be pregnant.  Whether you are breastfeeding. What are the risks? Generally, this is a safe procedure. However, serious problems may occur, including:  Damage to nearby structures or organs, such as the heart, blood vessels, or kidneys.  A return of blockage.  Bleeding, infection, or bruising at the insertion site.  A collection of blood under the skin (hematoma) at the insertion site.  A blood clot in another part of the body.  Allergic reaction to medicines or dyes.  Bleeding into the abdomen (retroperitoneal bleeding).  Stroke (rare).  Heart attack (rare). What happens before the procedure? Staying hydrated Follow instructions from your health care provider about hydration, which may include:  Up  to 2 hours before the procedure - you may continue to drink clear liquids, such as water, clear fruit juice, black coffee, and plain tea.  Eating and drinking restrictions Follow instructions from your health care provider about eating and drinking, which may include:  8 hours before the procedure - stop eating heavy meals or foods, such as meat, fried foods, or fatty foods.  6 hours before the procedure - stop eating light meals or foods, such as toast or cereal.  2 hours before the procedure - stop drinking clear liquids. Medicines Ask your health care provider about:  Changing or stopping your regular medicines. This is especially important if you are taking diabetes medicines or blood thinners.  Taking medicines such as aspirin and ibuprofen. These medicines can thin your blood. Do not take these medicines unless your health care provider tells you to take them. ? Generally, aspirin is recommended before a thin tube, called a catheter, is passed through a blood vessel and inserted into the heart (cardiac catheterization).  Taking over-the-counter medicines, vitamins, herbs, and supplements. General instructions  Do not use any products that contain nicotine or tobacco for at least 4 weeks before the procedure. These products include cigarettes, e-cigarettes, and chewing tobacco. If you need help quitting, ask your health  care provider.  Plan to have someone take you home from the hospital or clinic.  If you will be going home right after the procedure, plan to have someone with you for 24 hours.  You may have tests and imaging procedures.  Ask your health care provider: ? How your insertion site will be marked. Ask which artery will be used for the procedure. ? What steps will be taken to help prevent infection. These may include:  Removing hair at the insertion site.  Washing skin with a germ-killing soap.  Taking antibiotic medicine. What happens during the  procedure?   An IV will be inserted into one of your veins.  Electrodes may be placed on your chest to monitor your heart rate during the procedure.  You will be given one or more of the following: ? A medicine to help you relax (sedative). ? A medicine to numb the area (local anesthetic) for catheter insertion.  A small incision will be made for catheter insertion.  The catheter will be inserted into an artery using a guide wire. The location may be in your groin, your wrist, or the fold of your arm (near your elbow).  An X-ray procedure (fluoroscopy) will be used to help guide the catheter to the opening of the heart arteries.  A dye will be injected into the catheter. X-rays will be taken. The dye helps to show where any narrowing or blockages are located in the arteries.  Tell your health care provider if you have chest pain or trouble breathing.  A tiny wire will be guided to the blocked spot, and a balloon will be inflated to make the artery wider.  The stent will be expanded to crush the plaques into the wall of the vessel. The stent will hold the area open and improve the blood flow. Most stents have a drug coating to reduce the risk of the stent narrowing over time.  The artery may be made wider using a drill, laser, or other tools that remove plaques.  The catheter will be removed when the blood flow improves. The stent will stay where it was placed, and the lining of the artery will grow over it.  A bandage (dressing) will be placed on the insertion site. Pressure will be applied to stop bleeding.  The IV will be removed. This procedure may vary among health care providers and hospitals. What happens after the procedure?  Your blood pressure, heart rate, breathing rate, and blood oxygen level will be monitored until you leave the hospital or clinic.  If the procedure is done through the leg, you will lie flat in bed for a few hours or for as long as told by your health  care provider. You will be instructed not to bend or cross your legs.  The insertion site and the pulse in your foot or wrist will be checked often.  You may have more blood tests, X-rays, and a test that records the electrical activity of your heart (electrocardiogram, or ECG).  Do not drive for 24 hours if you were given a sedative during your procedure. Summary  Coronary angiogram with stent placement is a procedure to widen or open a narrowed coronary artery. This is done to treat heart problems.  Before the procedure, let your health care provider know about all the medical conditions and surgeries you have or have had.  This is a safe procedure. However, some problems may occur, including damage to nearby structures or organs, bleeding,  blood clots, or allergies.  Follow your health care provider's instructions about eating, drinking, medicines, and other lifestyle changes, such as quitting tobacco use before the procedure. This information is not intended to replace advice given to you by your health care provider. Make sure you discuss any questions you have with your health care provider. Document Revised: 04/22/2019 Document Reviewed: 04/22/2019 Elsevier Patient Education  2020 [employer].

## 2019-10-29 ENCOUNTER — Encounter: Payer: Self-pay | Admitting: Cardiology

## 2019-10-29 NOTE — Addendum Note (Signed)
Addended by: Pamala Hurry on: 10/29/2019 10:04 AM   Modules accepted: Orders

## 2019-11-02 ENCOUNTER — Other Ambulatory Visit (HOSPITAL_COMMUNITY)
Admission: RE | Admit: 2019-11-02 | Discharge: 2019-11-02 | Disposition: A | Discharge status: Home / Self Care | Payer: No Typology Code available for payment source | Source: Ambulatory Visit | Attending: Cardiovascular Disease | Admitting: Cardiovascular Disease

## 2019-11-02 DIAGNOSIS — Z20822 Contact with and (suspected) exposure to covid-19: Secondary | ICD-10-CM | POA: Diagnosis not present

## 2019-11-02 DIAGNOSIS — Z01812 Encounter for preprocedural laboratory examination: Secondary | ICD-10-CM | POA: Insufficient documentation

## 2019-11-02 MED FILL — NITROGLYCERIN 0.4 MG TAB SL: 0.4 | 17 days supply | Qty: 50 | Fill #0

## 2019-11-03 ENCOUNTER — Telehealth: Payer: Self-pay | Admitting: *Deleted

## 2019-11-03 LAB — NOVEL CORONAVIRUS, NAA (HOSP ORDER, SEND-OUT TO REF LAB; TAT 18-24 HRS): SARS-CoV-2, NAA: NOT DETECTED

## 2019-11-03 NOTE — Telephone Encounter (Signed)
Pt contacted pre-catheterization scheduled at [facility] for: Thursday November 05, 2019 7:30 AM Verified arrival time and place: [facility] Main Entrance A [facility]) at: 5:30 AM   No solid food after midnight prior to cath, clear liquids until 5 AM day of procedure. Contrast allergy: no   AM meds can be  taken pre-cath with sip of water including: ASA 81 mg   Confirmed patient has responsible adult to drive home post procedure and observe 24 hours after arriving home: yes  Currently, due to Covid-19 pandemic, only one support person will be allowed with patient. Must be the same support person for that patient's entire stay and will be required to wear a mask. They will be asked to wait in the waiting room for the duration of the patient's stay.  Patients are required to wear a mask when they enter the hospital.     COVID-19 Pre-Screening Questions:  . In the past 7 to 10 days have you had a cough,  shortness of breath, headache, congestion, fever (100 or greater) body aches, chills, sore throat, or sudden loss of taste or sense of smell? no . Have you been around anyone with known Covid 19? no . Have you been around anyone who is awaiting Covid 19 test results in the past 7 to 10 days? no . Have you been around anyone who has been exposed to Covid 19, or has mentioned symptoms of Covid 19 within the past 7 to 10 days? no    I reviewed procedure/mask/visitor instructions, Covid-19 screening questions with patient, he verbalized understanding, thanked me for call.  Pt states he received first [employer] vaccine shot about one and one-half weeks ago.

## 2019-11-05 ENCOUNTER — Other Ambulatory Visit: Payer: Self-pay

## 2019-11-05 ENCOUNTER — Ambulatory Visit (HOSPITAL_COMMUNITY)
Admission: RE | Disposition: A | Discharge status: Home / Self Care | Payer: No Typology Code available for payment source | Source: Home / Self Care | Attending: Cardiovascular Disease

## 2019-11-05 ENCOUNTER — Ambulatory Visit (HOSPITAL_COMMUNITY)
Admission: RE | Admit: 2019-11-05 | Discharge: 2019-11-05 | Disposition: A | Discharge status: Home / Self Care | Payer: No Typology Code available for payment source | Attending: Cardiovascular Disease | Admitting: Cardiovascular Disease

## 2019-11-05 DIAGNOSIS — Z79899 Other long term (current) drug therapy: Secondary | ICD-10-CM | POA: Insufficient documentation

## 2019-11-05 DIAGNOSIS — Z95820 Peripheral vascular angioplasty status with implants and grafts: Secondary | ICD-10-CM

## 2019-11-05 DIAGNOSIS — Z7982 Long term (current) use of aspirin: Secondary | ICD-10-CM | POA: Insufficient documentation

## 2019-11-05 DIAGNOSIS — F329 Major depressive disorder, single episode, unspecified: Secondary | ICD-10-CM | POA: Diagnosis not present

## 2019-11-05 DIAGNOSIS — Z951 Presence of aortocoronary bypass graft: Secondary | ICD-10-CM | POA: Insufficient documentation

## 2019-11-05 DIAGNOSIS — Z8673 Personal history of transient ischemic attack (TIA), and cerebral infarction without residual deficits: Secondary | ICD-10-CM | POA: Insufficient documentation

## 2019-11-05 DIAGNOSIS — I25718 Atherosclerosis of autologous vein coronary artery bypass graft(s) with other forms of angina pectoris: Secondary | ICD-10-CM | POA: Diagnosis not present

## 2019-11-05 DIAGNOSIS — Z880 Allergy status to penicillin: Secondary | ICD-10-CM | POA: Insufficient documentation

## 2019-11-05 DIAGNOSIS — Z8711 Personal history of peptic ulcer disease: Secondary | ICD-10-CM | POA: Diagnosis not present

## 2019-11-05 DIAGNOSIS — E7800 Pure hypercholesterolemia, unspecified: Secondary | ICD-10-CM

## 2019-11-05 DIAGNOSIS — Z7902 Long term (current) use of antithrombotics/antiplatelets: Secondary | ICD-10-CM | POA: Insufficient documentation

## 2019-11-05 DIAGNOSIS — I10 Essential (primary) hypertension: Secondary | ICD-10-CM | POA: Diagnosis not present

## 2019-11-05 DIAGNOSIS — E782 Mixed hyperlipidemia: Secondary | ICD-10-CM | POA: Diagnosis not present

## 2019-11-05 DIAGNOSIS — I209 Angina pectoris, unspecified: Secondary | ICD-10-CM

## 2019-11-05 DIAGNOSIS — I25118 Atherosclerotic heart disease of native coronary artery with other forms of angina pectoris: Secondary | ICD-10-CM | POA: Insufficient documentation

## 2019-11-05 DIAGNOSIS — I2510 Atherosclerotic heart disease of native coronary artery without angina pectoris: Secondary | ICD-10-CM

## 2019-11-05 HISTORY — PX: CORONARY STENT INTERVENTION: CATH118234

## 2019-11-05 HISTORY — PX: RENAL ANGIOGRAPHY: CATH118260

## 2019-11-05 HISTORY — PX: LEFT HEART CATH AND CORS/GRAFTS ANGIOGRAPHY: CATH118250

## 2019-11-05 LAB — POCT ACTIVATED CLOTTING TIME
Activated Clotting Time: 257 seconds
Activated Clotting Time: 274 seconds
Activated Clotting Time: 213 seconds
Activated Clotting Time: 175 seconds
Activated Clotting Time: 191 seconds

## 2019-11-05 SURGERY — LEFT HEART CATH AND CORS/GRAFTS ANGIOGRAPHY
Anesthesia: LOCAL

## 2019-11-05 MED ORDER — ONDANSETRON HCL 4 MG/2ML IJ SOLN: 4.0000 mg | Freq: Four times a day (QID) | INTRAMUSCULAR | Status: DC | PRN

## 2019-11-05 MED ORDER — CLOPIDOGREL BISULFATE 300 MG PO TABS
ORAL_TABLET | ORAL | Status: AC
  Filled 2019-11-05: qty 2

## 2019-11-05 MED ORDER — HEPARIN SODIUM (PORCINE) 1000 UNIT/ML IJ SOLN
INTRAMUSCULAR | Status: AC
  Filled 2019-11-05: qty 1

## 2019-11-05 MED ORDER — CLOPIDOGREL BISULFATE 300 MG PO TABS
ORAL_TABLET | ORAL | Status: DC | PRN
  Administered 2019-11-05: 600 mg via ORAL

## 2019-11-05 MED ORDER — HEPARIN (PORCINE) IN NACL 1000-0.9 UT/500ML-% IV SOLN
INTRAVENOUS | Status: AC
  Filled 2019-11-05: qty 1500

## 2019-11-05 MED ORDER — IOHEXOL 350 MG/ML SOLN
INTRAVENOUS | Status: DC | PRN
  Administered 2019-11-05: 190 mL via INTRA_ARTERIAL

## 2019-11-05 MED ORDER — ASPIRIN 81 MG PO CHEW: 81.0000 mg | CHEWABLE_TABLET | ORAL | Status: DC

## 2019-11-05 MED ORDER — CLOPIDOGREL BISULFATE 75 MG PO TABS: 75.0000 mg | ORAL_TABLET | Freq: Every day | ORAL | 0 refills | Status: DC

## 2019-11-05 MED ORDER — SODIUM CHLORIDE 0.9 % WEIGHT BASED INFUSION: 1.0000 mL/kg/h | INTRAVENOUS | Status: DC

## 2019-11-05 MED ORDER — LIDOCAINE HCL (PF) 1 % IJ SOLN
INTRAMUSCULAR | Status: DC | PRN
  Administered 2019-11-05: 20 mL

## 2019-11-05 MED ORDER — MORPHINE SULFATE (PF) 2 MG/ML IV SOLN: 2.0000 mg | INTRAVENOUS | Status: DC | PRN

## 2019-11-05 MED ORDER — SODIUM CHLORIDE 0.9 % WEIGHT BASED INFUSION
3.0000 mL/kg/h | INTRAVENOUS | Status: AC
  Administered 2019-11-05: 3 mL/kg/h via INTRAVENOUS

## 2019-11-05 MED ORDER — SODIUM CHLORIDE 0.9% FLUSH: 3.0000 mL | INTRAVENOUS | Status: DC | PRN

## 2019-11-05 MED ORDER — LIDOCAINE HCL (PF) 1 % IJ SOLN
INTRAMUSCULAR | Status: AC
  Filled 2019-11-05: qty 30

## 2019-11-05 MED ORDER — HEPARIN SODIUM (PORCINE) 1000 UNIT/ML IJ SOLN
INTRAMUSCULAR | Status: DC | PRN
  Administered 2019-11-05: 10000 [IU] via INTRAVENOUS
  Administered 2019-11-05: 3500 [IU] via INTRAVENOUS

## 2019-11-05 MED ORDER — LABETALOL HCL 5 MG/ML IV SOLN: 10.0000 mg | INTRAVENOUS | Status: AC | PRN

## 2019-11-05 MED ORDER — FENTANYL CITRATE (PF) 100 MCG/2ML IJ SOLN
INTRAMUSCULAR | Status: DC | PRN
  Administered 2019-11-05: 25 ug via INTRAVENOUS

## 2019-11-05 MED ORDER — ROSUVASTATIN CALCIUM 20 MG PO TABS: 20.0000 mg | ORAL_TABLET | Freq: Every day | ORAL | 11 refills | Status: DC

## 2019-11-05 MED ORDER — VERAPAMIL HCL 2.5 MG/ML IV SOLN
INTRAVENOUS | Status: AC
  Filled 2019-11-05: qty 2

## 2019-11-05 MED ORDER — ASPIRIN 81 MG PO CHEW: 81.0000 mg | CHEWABLE_TABLET | Freq: Every day | ORAL | Status: DC

## 2019-11-05 MED ORDER — HEPARIN (PORCINE) IN NACL 1000-0.9 UT/500ML-% IV SOLN
INTRAVENOUS | Status: DC | PRN
  Administered 2019-11-05: 500 mL

## 2019-11-05 MED ORDER — SODIUM CHLORIDE 0.9 % IV SOLN: 250.0000 mL | INTRAVENOUS | Status: DC | PRN

## 2019-11-05 MED ORDER — IOHEXOL 350 MG/ML SOLN
INTRAVENOUS | Status: AC
  Filled 2019-11-05: qty 1

## 2019-11-05 MED ORDER — NITROGLYCERIN 1 MG/10 ML FOR IR/CATH LAB
INTRA_ARTERIAL | Status: AC
  Filled 2019-11-05: qty 10

## 2019-11-05 MED ORDER — MIDAZOLAM HCL 2 MG/2ML IJ SOLN
INTRAMUSCULAR | Status: AC
  Filled 2019-11-05: qty 2

## 2019-11-05 MED ORDER — ASPIRIN EC 81 MG PO TBEC: 81.0000 mg | DELAYED_RELEASE_TABLET | Freq: Every day | ORAL | 0 refills | Status: DC

## 2019-11-05 MED ORDER — HYDRALAZINE HCL 20 MG/ML IJ SOLN: 10.0000 mg | INTRAMUSCULAR | Status: AC | PRN

## 2019-11-05 MED ORDER — ATORVASTATIN CALCIUM 80 MG PO TABS: 80.0000 mg | ORAL_TABLET | Freq: Every day | ORAL | Status: DC

## 2019-11-05 MED ORDER — FENTANYL CITRATE (PF) 100 MCG/2ML IJ SOLN
INTRAMUSCULAR | Status: AC
  Filled 2019-11-05: qty 2

## 2019-11-05 MED ORDER — MIDAZOLAM HCL 2 MG/2ML IJ SOLN
INTRAMUSCULAR | Status: DC | PRN
  Administered 2019-11-05: 1 mg via INTRAVENOUS

## 2019-11-05 MED ORDER — SODIUM CHLORIDE 0.9% FLUSH: 3.0000 mL | Freq: Two times a day (BID) | INTRAVENOUS | Status: DC

## 2019-11-05 MED ORDER — ACETAMINOPHEN 325 MG PO TABS: 650.0000 mg | ORAL_TABLET | ORAL | Status: DC | PRN

## 2019-11-05 MED ORDER — FAMOTIDINE 20 MG PO TABS: 20.0000 mg | ORAL_TABLET | Freq: Every day | ORAL | 12 refills | Status: DC

## 2019-11-05 MED ORDER — CLOPIDOGREL BISULFATE 75 MG PO TABS: 75.0000 mg | ORAL_TABLET | Freq: Every day | ORAL | Status: DC

## 2019-11-05 MED ORDER — LORAZEPAM 1 MG PO TABS: 1.0000 mg | ORAL_TABLET | Freq: Two times a day (BID) | ORAL | Status: DC

## 2019-11-05 MED ORDER — SODIUM CHLORIDE 0.9 % IV SOLN: INTRAVENOUS | Status: AC

## 2019-11-05 MED FILL — METOPROLOL SUCCINATE ER 100: 100 | 90 days supply | Qty: 90 | Fill #0

## 2019-11-05 MED FILL — CLOPIDOGREL 75 MG TABLET: 75 | 30 days supply | Qty: 30 | Fill #0

## 2019-11-05 MED FILL — FAMOTIDINE 20 MG TABS: 20 | 30 days supply | Qty: 30 | Fill #0

## 2019-11-05 MED FILL — ASPIRIN LOW DOSE 81 MG TBEC: 81 | 30 days supply | Qty: 30 | Fill #0

## 2019-11-05 MED FILL — LOSARTAN POTASSIUM 100 MG T: 100 | 90 days supply | Qty: 90 | Fill #0

## 2019-11-05 MED FILL — ROSUVASTATIN CALCIUM 20 MG: 20 | 30 days supply | Qty: 30 | Fill #0

## 2019-11-05 MED FILL — AMLODIPINE BESYLATE 5 MG TA: 5 | 90 days supply | Qty: 90 | Fill #0

## 2019-11-05 SURGICAL SUPPLY — 18 items
BALLN SAPPHIRE ~~LOC~~ 3.25X12 (BALLOONS) ×1 IMPLANT
CATH INFINITI 5FR ANG PIGTAIL (CATHETERS) ×1 IMPLANT
CATH INFINITI 5FR JL4 (CATHETERS) ×1 IMPLANT
CATH INFINITI JR4 5F (CATHETERS) ×1 IMPLANT
CATH VISTA GUIDE 6FR LCB (CATHETERS) ×1 IMPLANT
KIT ENCORE 26 ADVANTAGE (KITS) ×2 IMPLANT
KIT PV (KITS) ×2 IMPLANT
SHEATH PINNACLE 5F 10CM (SHEATH) ×1 IMPLANT
SHEATH PINNACLE 6F 10CM (SHEATH) ×2 IMPLANT
STENT SYNERGY XD 3.0X16 (Permanent Stent) IMPLANT
STOPCOCK MORSE 400PSI 3WAY (MISCELLANEOUS) ×1 IMPLANT
SYNERGY XD 3.0X16 (Permanent Stent) ×2 IMPLANT
SYR MEDRAD MARK 7 150ML (SYRINGE) ×2 IMPLANT
TRANSDUCER W/STOPCOCK (MISCELLANEOUS) ×2 IMPLANT
TRAY PV CATH (CUSTOM PROCEDURE TRAY) ×2 IMPLANT
TUBING CIL FLEX 10 FLL-RA (TUBING) ×2 IMPLANT
WIRE ASAHI PROWATER 180CM (WIRE) ×1 IMPLANT
WIRE EMERALD 3MM-J .035X150CM (WIRE) ×1 IMPLANT

## 2019-11-05 NOTE — Interval H&P Note (Signed)
Cath Lab Visit (complete for each Cath Lab visit)  Clinical Evaluation Leading to the Procedure:   ACS: No.  Non-ACS:    Anginal Classification: CCS II  Anti-ischemic medical therapy: Minimal Therapy (1 class of medications)  Non-Invasive Test Results: No non-invasive testing performed  Prior CABG: Previous CABG      History and Physical Interval Note:  11/05/2019 9:15 AM  Jon Gonzales  has presented today for surgery, with the diagnosis of angina.  The various methods of treatment have been discussed with the patient and family. After consideration of risks, benefits and other options for treatment, the patient has consented to  Procedure(s): LEFT HEART CATH AND CORS/GRAFTS ANGIOGRAPHY (N/A) RENAL ANGIOGRAPHY (N/A) as a surgical intervention.  The patient's history has been reviewed, patient examined, no change in status, stable for surgery.  I have reviewed the patient's chart and labs.  Questions were answered to the patient's satisfaction.     Nanetta Batty

## 2019-11-05 NOTE — Discharge Summary (Addendum)
Discharge Summary for Same Day PCI   Patient ID: Jon Gonzales MRN: [id number]; DOB: Feb 22, 1961  Admit date: 11/05/2019 Discharge date: 11/05/2019  Primary Care Provider: Lillard Anes, MD  Primary Cardiologist: Jenean Lindau, MD  Primary Electrophysiologist:  None   Discharge Diagnoses    Principal Problem:   Angina pectoris [facility]) Active Problems:   Hypercholesterolemia   Hypertension    Diagnostic Studies/Procedures    Cardiac Catheterization 11/05/2019:  Mid RCA lesion is 50% stenosed. Prox Cx lesion is 90% stenosed. Previously placed Mid Cx stent (unknown type) is widely patent. Ost LAD to Prox LAD lesion is 80% stenosed. Mid LAD lesion is 80% stenosed. 1st Diag lesion is 90% stenosed. A drug-eluting stent was successfully placed using a SYNERGY XD 3.0X16. Origin to Prox Graft lesion is 60% stenosed. Post intervention, there is a 0% residual stenosis. The left ventricular systolic function is normal. LV end diastolic pressure is normal. The left ventricular ejection fraction is 55-65% by visual estimate.  IMPRESSION: Jon Gonzales had patent grafts with a significant lesion at the origin of the of the SVG to the first diagonal branch.  He also had a 40 to 50% segmental mid to distal RCA stenosis at the genu that did not appear to be physiologically significant.  The LIMA to the LAD was patent and the vein to the distal marginal branch was as well.  I reviewed his angiograms with Dr. Tamala Julian and we both agreed to proceed with diagonal branch intervention which was successful using a synergy drug-eluting stent.  Given the low risk nature of the procedure, the ease of access I feel comfortable send the patient home today as an outpatient.  He will see Dr. Geraldo Pitter back in the office in 2 to 3 weeks.  He will need dual antiplatelet therapy with aspirin Plavix uninterrupted for at least 12 months.   Jon Gonzales. MD, [facility]  Diagnostic Dominance:  Right  Intervention    _____________   History of Present Illness     Jon Gonzales is a 59 y.o. male with PMH of CAD s/p CABG, HTN, HL, TIA, PUD, Depression who was recently seen in the office with Dr. Geraldo Pitter. He reported at this office visit he has not seen a cardiologist for a long time.  He complained of dyspnea on exertion and chest tightness.  He leads a sedentary lifestyle.  Anytime he tried to do something he would become short of breath more than usual and this was new for him.  He leads a sedentary lifestyle and does not exercise on a regular basis. Given his symptoms he was referred to Cardiology for further evaluation.  Outpatient cardiac catheterization was arranged for further evaluation.  Hospital Course     The patient underwent cardiac cath as noted above with new lesion in the SVG-1st diag, treated with PCI/DES x1. LIMA-LAD and SVG-OM were patent. Plan for DAPT with ASA/plavix for at least one year. The patient was seen by cardiac rehab while in short stay. There were no observed complications post cath. ACT dropped to 175 and femoral sheath was pulled without complications. Femoral cath site was re-evaluated prior to discharge and found to be stable without any complications.   Jon Gonzales was seen by Dr. Gwenlyn Found and determined stable for discharge home. Follow up with our office has been arranged. Instructions/precautions regarding cath site care were given prior to discharge. Medications are listed below. Pertinent changes include increased statin from Crestor 77m to 235mdaily  and the addition of plavix.  _____________  Did the patient have an acute coronary syndrome (MI, NSTEMI, STEMI, etc) this admission?:  Yes                               AHA/ACC Clinical Performance & Quality Measures: Aspirin prescribed? - Yes ADP Receptor Inhibitor (Plavix/Clopidogrel, Brilinta/Ticagrelor or Effient/Prasugrel) prescribed (includes medically managed patients)? - Yes Beta  Blocker prescribed? - Yes High Intensity Statin (Lipitor 40-6m or Crestor 20-46m prescribed? - Yes EF assessed during THIS hospitalization? - Yes For EF <40%, was ACEI/ARB prescribed? - Yes For EF <40%, Aldosterone Antagonist (Spironolactone or Eplerenone) prescribed? - Not Applicable (EF >/= 4068%Cardiac Rehab Phase II ordered (Included Medically managed Patients)? - Yes   _____________   Discharge Vitals Blood pressure 136/76, pulse (!) 53, temperature 97.9 F (36.6 C), temperature source Oral, resp. rate 14, height '5\' 10"'  (1.778 m), weight 92.7 kg, SpO2 95 %.  Filed Weights   11/05/19 0742  Weight: 92.7 kg    Last Labs & Radiologic Studies    CBC No results for input(s): WBC, NEUTROABS, HGB, HCT, MCV, PLT in the last 72 hours. Basic Metabolic Panel No results for input(s): NA, K, CL, CO2, GLUCOSE, BUN, CREATININE, CALCIUM, MG, PHOS in the last 72 hours. Liver Function Tests No results for input(s): AST, ALT, ALKPHOS, BILITOT, PROT, ALBUMIN in the last 72 hours. No results for input(s): LIPASE, AMYLASE in the last 72 hours. High Sensitivity Troponin:   No results for input(s): TROPONINIHS in the last 720 hours.  BNP Invalid input(s): POCBNP D-Dimer No results for input(s): DDIMER in the last 72 hours. Hemoglobin A1C No results for input(s): HGBA1C in the last 72 hours. Fasting Lipid Panel No results for input(s): CHOL, HDL, LDLCALC, TRIG, CHOLHDL, LDLDIRECT in the last 72 hours. Thyroid Function Tests No results for input(s): TSH, T4TOTAL, T3FREE, THYROIDAB in the last 72 hours.  Invalid input(s): FREET3 _____________  CARDIAC CATHETERIZATION  Result Date: 11/05/2019  Mid RCA lesion is 50% stenosed.  Prox Cx lesion is 90% stenosed.  Previously placed Mid Cx stent (unknown type) is widely patent.  Ost LAD to Prox LAD lesion is 80% stenosed.  Mid LAD lesion is 80% stenosed.  1st Diag lesion is 90% stenosed.  A drug-eluting stent was successfully placed using a  SYNERGY XD 3.0X16.  Origin to Prox Graft lesion is 60% stenosed.  Post intervention, there is a 0% residual stenosis.  The left ventricular systolic function is normal.  LV end diastolic pressure is normal.  The left ventricular ejection fraction is 55-65% by visual estimate.  Jon Gonzales a [age].o. male  [id number]:  [facility] JoQuay BurowM.D. 3/February 25, 1962ATE OF PROCEDURE:  11/05/2019 DATE OF DISCHARGE: CARDIAC CATHETERIZATION / PCI DES SVG Diag History obtained from chart review.  Jon Gonzales a [age]ear old married Caucasian male patient of Dr. ReJulien Nordmannas had multiple stents remotely [facility] with a LIMA to his LAD, vein to a diagonal branch and obtuse marginal branch.  Other problems include hypertension and hyperlipidemia.  He said several weeks of exertional chest pain and dyspnea.  He was referred by Dr. ReGeraldo Pitteror diagnostic coronary angiography.  In addition, because of his hypertension it was requested that he had renal angiography as well to rule out renal vascular hypertension. PROCEDURE DESCRIPTION: The patient was brought to the second  floor [facility] cath lab in the postabsorptive state. He was premedicated with IV Versed and fentanyl. His right groinwas prepped and shaved in usual sterile fashion. Xylocaine 1% was used for local anesthesia. A 6 French sheath was inserted into the right common femoral artery using standard Seldinger technique.  5 French right and left Judkins diagnostic catheters on with a 5 French pigtail catheter used for selective coronary angiography, selective vein graft and IMA graft angiography as well as left ventriculography.  Distal abdominal aortography was also performed.  Isovue dye was used for the entirety of the case.  Retrograde aorta, ventricular and pullback pressures were recorded. The patient received 60 mg of p.o. Plavix, 20 mg of IV Pepcid and 13,500 units  of heparin with an ACT of 274.  Using a 6 French left bypass graft catheter, 0.14 Prowater guidewire and a 3 mm x 16 mm long Synergy drug-eluting stent the origin of the vein graft was engaged.  There was significant damping and slow flow which resolved with disengagement suggesting this was indeed a hemodynamically significant lesion.  I carefully positioned the stent across the origin of the graft and deployed it at 14 to 16 atm.  I postdilated with a 3.25 mm x 12 mm long noncompliant balloon up to 14 atm (3.3 mm) resulting in reduction of a 60 to 70% ostial first diagonal branch SVG to 0% residual.  Patient tolerated procedure well.  Guidewire and catheter were removed.  The sheath was secured in place.  The patient left lab in stable condition.   Jon Gonzales had patent grafts with a significant lesion at the origin of the of the SVG to the first diagonal branch.  He also had a 40 to 50% segmental mid to distal RCA stenosis at the genu that did not appear to be physiologically significant.  The LIMA to the LAD was patent and the vein to the distal marginal branch was as well.  I reviewed his angiograms with Dr. Tamala Julian and we both agreed to proceed with diagonal branch intervention which was successful using a synergy drug-eluting stent.  Given the low risk nature of the procedure, the ease of access I feel comfortable send the patient home today as an outpatient.  He will see Dr. Geraldo Pitter back in the office in 2 to 3 weeks.  He will need dual antiplatelet therapy with aspirin Plavix uninterrupted for at least 12 months. Jon Gonzales. MD, [facility] 11/05/2019 11:23 AM   PERIPHERAL VASCULAR CATHETERIZATION  Result Date: 11/05/2019  Mid RCA lesion is 50% stenosed.  Prox Cx lesion is 90% stenosed.  Previously placed Mid Cx stent (unknown type) is widely patent.  Ost LAD to Prox LAD lesion is 80% stenosed.  Mid LAD lesion is 80% stenosed.  1st Diag lesion is 90% stenosed.  A drug-eluting stent was successfully  placed using a SYNERGY XD 3.0X16.  Origin to Prox Graft lesion is 60% stenosed.  Post intervention, there is a 0% residual stenosis.  The left ventricular systolic function is normal.  LV end diastolic pressure is normal.  The left ventricular ejection fraction is 55-65% by visual estimate.  Jon Gonzales is a 59 y.o. male  [id number] LOCATION:  FACILITY: [facility] PHYSICIAN: Jon Gonzales, M.D. 30-Sep-1961 DATE OF PROCEDURE:  11/05/2019 DATE OF DISCHARGE: CARDIAC CATHETERIZATION / PCI DES SVG Diag History obtained from chart review.  Jon Gonzales is a 59 year old married Caucasian male patient of Dr. Julien Nordmann has had multiple stents remotely ultimately underwent CABG x3  at [facility] in 2013 with a LIMA to his LAD, vein to a diagonal branch and obtuse marginal branch.  Other problems include hypertension and hyperlipidemia.  He said several weeks of exertional chest pain and dyspnea.  He was referred by Dr. Geraldo Pitter for diagnostic coronary angiography.  In addition, because of his hypertension it was requested that he had renal angiography as well to rule out renal vascular hypertension. PROCEDURE DESCRIPTION: The patient was brought to the second floor [facility] cath lab in the postabsorptive state. He was premedicated with IV Versed and fentanyl. His right groinwas prepped and shaved in usual sterile fashion. Xylocaine 1% was used for local anesthesia. A 6 French sheath was inserted into the right common femoral artery using standard Seldinger technique.  5 French right and left Judkins diagnostic catheters on with a 5 French pigtail catheter used for selective coronary angiography, selective vein graft and IMA graft angiography as well as left ventriculography.  Distal abdominal aortography was also performed.  Isovue dye was used for the entirety of the case.  Retrograde aorta, ventricular and pullback pressures were recorded. The patient received 60 mg of p.o. Plavix, 20 mg of IV Pepcid  and 13,500 units of heparin with an ACT of 274.  Using a 6 French left bypass graft catheter, 0.14 Prowater guidewire and a 3 mm x 16 mm long Synergy drug-eluting stent the origin of the vein graft was engaged.  There was significant damping and slow flow which resolved with disengagement suggesting this was indeed a hemodynamically significant lesion.  I carefully positioned the stent across the origin of the graft and deployed it at 14 to 16 atm.  I postdilated with a 3.25 mm x 12 mm long noncompliant balloon up to 14 atm (3.3 mm) resulting in reduction of a 60 to 70% ostial first diagonal branch SVG to 0% residual.  Patient tolerated procedure well.  Guidewire and catheter were removed.  The sheath was secured in place.  The patient left lab in stable condition.   Jon Gonzales had patent grafts with a significant lesion at the origin of the of the SVG to the first diagonal branch.  He also had a 40 to 50% segmental mid to distal RCA stenosis at the genu that did not appear to be physiologically significant.  The LIMA to the LAD was patent and the vein to the distal marginal branch was as well.  I reviewed his angiograms with Dr. Tamala Julian and we both agreed to proceed with diagonal branch intervention which was successful using a synergy drug-eluting stent.  Given the low risk nature of the procedure, the ease of access I feel comfortable send the patient home today as an outpatient.  He will see Dr. Geraldo Pitter back in the office in 2 to 3 weeks.  He will need dual antiplatelet therapy with aspirin Plavix uninterrupted for at least 12 months. Jon Gonzales. MD, [facility] 11/05/2019 11:23 AM    Disposition   Pt is being discharged home today in good condition.  Follow-up Plans & Appointments    Follow-up Information     Revankar, Reita Cliche, MD Follow up on 11/17/2019.   Specialty: Cardiology Why:  at 9:20am for your follow up appt.  Contact information: [place] [address] [phone]            Discharge Instructions     AMB Referral to Cardiac Rehabilitation - Phase II   Complete by: As directed  Diagnosis: Coronary Stents   After initial evaluation and assessments completed: Virtual Based Care may be provided alone or in conjunction with Phase 2 Cardiac Rehab based on patient barriers.: Yes      Discharge Medications   Allergies as of 11/05/2019       Reactions   Trintellix [vortioxetine] Other (See Comments)   Headaches.   Penicillins Rash   Did it involve swelling of the face/tongue/throat, SOB, or low BP? Unknown Did it involve sudden or severe rash/hives, skin peeling, or any reaction on the inside of your mouth or nose? Unknown Did you need to seek medical attention at a hospital or doctor's office? Unknown When did it last happen? childhood reaction      If all above answers are "NO", may proceed with cephalosporin use.        Medication List     STOP taking these medications    Goodys Extra Strength 520-260-32.5 MG Pack Generic drug: Aspirin-Acetaminophen-Caffeine   omeprazole 20 MG capsule Commonly known as: PRILOSEC       TAKE these medications    acetaminophen 500 MG tablet Commonly known as: TYLENOL Take 1,000 mg by mouth every 6 (six) hours as needed (for pain.).   aspirin EC 81 MG tablet Take 1 tablet (81 mg total) by mouth daily.   benazepril 20 MG tablet Commonly known as: LOTENSIN Take 20 mg by mouth 2 (two) times daily.   cloNIDine 0.1 MG tablet Commonly known as: CATAPRES Take 0.1 mg by mouth 2 (two) times daily.   clopidogrel 75 MG tablet Commonly known as: Plavix Take 1 tablet (75 mg total) by mouth daily.   DULoxetine 60 MG capsule Commonly known as: CYMBALTA Take 60 mg by mouth 2 (two) times daily.   eszopiclone 3 MG Tabs Generic drug: Eszopiclone Take 3 mg by mouth at bedtime as needed (sleep). Take immediately before bedtime   famotidine 20 MG tablet Commonly known as: Pepcid Take 1 tablet (20 mg total) by  mouth at bedtime.   lithium carbonate 300 MG CR tablet Commonly known as: LITHOBID Take 300 mg by mouth 2 (two) times daily.   LORazepam 1 MG tablet Commonly known as: ATIVAN Take 1 mg by mouth 2 (two) times daily.   metoprolol succinate 50 MG 24 hr tablet Commonly known as: TOPROL-XL Take 50 mg by mouth 2 (two) times daily. Take with or immediately following a meal.   multivitamin with minerals Tabs tablet Take 1 tablet by mouth daily. Men's Multivitamin   nitroGLYCERIN 0.4 MG SL tablet Commonly known as: NITROSTAT Place 0.4 mg under the tongue every 5 (five) minutes x 3 doses as needed for chest pain.   rosuvastatin 20 MG tablet Commonly known as: Crestor Take 1 tablet (20 mg total) by mouth at bedtime. What changed:  medication strength how much to take           Allergies Allergies  Allergen Reactions   Trintellix [Vortioxetine] Other (See Comments)    Headaches.   Penicillins Rash    Did it involve swelling of the face/tongue/throat, SOB, or low BP? Unknown Did it involve sudden or severe rash/hives, skin peeling, or any reaction on the inside of your mouth or nose? Unknown Did you need to seek medical attention at a hospital or doctor's office? Unknown When did it last happen? childhood reaction      If all above answers are "NO", may proceed with cephalosporin use.     Outstanding Labs/Studies   FLP/LFTs in  8 weeks   Duration of Discharge Encounter   Greater than 30 minutes including physician time.  Signed, Reino Bellis, NP 11/05/2019, 3:07 PM  Agree with note by Reino Bellis NP-C  Pt stable for DC home. S/P Diag SVG Ostial PCI/DES. Groin stable at DC. OP F/U arranged. DAPT 12 months uninterrupted  Lorretta Harp, M.D., [place], [facility], [place], [place] [address]. [place], [place]  [address]  [phone] 11/06/2019 6:43 AM

## 2019-11-05 NOTE — Discharge Instructions (Signed)
Information about your medication: Plavix (anti-platelet agent) ° °Generic Name (Brand): clopidogrel (Plavix), once daily medication ° °PURPOSE: You are taking this medication along with aspirin to lower your chance of having a heart attack, stroke, or blood clots in your heart stent. These can be fatal. Brilinta and aspirin help prevent platelets from sticking together and forming a clot that can block an artery or your stent.  ° °Common SIDE EFFECTS you may experience include: bruising or bleeding more easily, shortness of breath ° °Do not stop taking PLAVIX without talking to the doctor who prescribes it for you. People who are treated with a stent and stop taking Plavix too soon, have a higher risk of getting a blood clot in the stent, having a heart attack, or dying. If you stop Plavix because of bleeding, or for other reasons, your risk of a heart attack or stroke may increase.  ° °Tell all of your doctors and dentists that you are taking Plavix. They should talk to the doctor who prescribed Brilinta for you before you have any surgery or invasive procedure.  ° °Contact your health care provider if you experience: severe or uncontrollable bleeding, pink/red/brown urine, vomiting blood or vomit that looks like "coffee grounds", red or black stools (looks like tar), coughing up blood or blood clots °---------------------------------------------------------------------------------------------------------------------- ° ° °

## 2019-11-05 NOTE — Progress Notes (Signed)
1500-1530 Gave pt's RN heart healthy diet sheet and walking instructions to give to pt and she is to review NTG use and importance of plavix. Pt has referral by Dr Allyson Sabal for CRP 2. Will send letter to  CRP 2. Pt is still in holding area. Luetta Nutting RN BSN 11/05/2019 3:22 PM

## 2019-11-05 NOTE — Progress Notes (Signed)
Site area- right  Site Prior to Removal- 0   Pressure Applied For-  30 MInutes   Bedrest Beginning at - 1510   Manual- Yes   Patient Status During Pull- Stable    Post Pull Groin Site- 0   Post Pull Instructions Given- Yes   Post Pull Pulses Present- Yes    Dressing Applied- Tegaderm and Gauze Dressing    Comments:  bleeding was difficult to get control of- act 175. Firm pressure for 30 minutes

## 2019-11-06 MED FILL — Verapamil HCl IV Soln 2.5 MG/ML: INTRAVENOUS | Qty: 2 | Status: AC

## 2019-11-16 ENCOUNTER — Ambulatory Visit: Payer: No Typology Code available for payment source | Admitting: Legal Medicine

## 2019-11-17 ENCOUNTER — Other Ambulatory Visit: Payer: Self-pay

## 2019-11-17 ENCOUNTER — Ambulatory Visit: Payer: No Typology Code available for payment source

## 2019-11-17 ENCOUNTER — Ambulatory Visit (INDEPENDENT_AMBULATORY_CARE_PROVIDER_SITE_OTHER): Payer: No Typology Code available for payment source | Admitting: Cardiology

## 2019-11-17 ENCOUNTER — Ambulatory Visit: Payer: No Typology Code available for payment source | Admitting: Legal Medicine

## 2019-11-17 ENCOUNTER — Encounter: Payer: Self-pay | Admitting: Cardiology

## 2019-11-17 VITALS — BP 108/84 | HR 82 | Ht 70.0 in | Wt 213.0 lb

## 2019-11-17 DIAGNOSIS — I10 Essential (primary) hypertension: Secondary | ICD-10-CM | POA: Diagnosis not present

## 2019-11-17 DIAGNOSIS — G4733 Obstructive sleep apnea (adult) (pediatric): Secondary | ICD-10-CM | POA: Diagnosis not present

## 2019-11-17 DIAGNOSIS — E7800 Pure hypercholesterolemia, unspecified: Secondary | ICD-10-CM | POA: Diagnosis not present

## 2019-11-17 DIAGNOSIS — I2510 Atherosclerotic heart disease of native coronary artery without angina pectoris: Secondary | ICD-10-CM

## 2019-11-17 NOTE — Patient Instructions (Signed)
Medication Instructions:  No medication changes *If you need a refill on your cardiac medications before your next appointment, please call your pharmacy*  Lab Work: None ordered If you have labs (blood work) drawn today and your tests are completely normal, you will receive your results only by: . MyChart Message (if you have MyChart) OR . A paper copy in the mail If you have any lab test that is abnormal or we need to change your treatment, we will call you to review the results.  Testing/Procedures: None ordered  Follow-Up: At [facility], you and your health needs are our priority.  As part of our continuing mission to provide you with exceptional heart care, we have created designated Provider Care Teams.  These Care Teams include your primary Cardiologist (physician) and Advanced Practice Providers (APPs -  Physician Assistants and Nurse Practitioners) who all work together to provide you with the care you need, when you need it.  Your next appointment:   3 month(s)  The format for your next appointment:   In Person  Provider:   Rajan Revankar, MD  Other Instructions NA  

## 2019-11-17 NOTE — Progress Notes (Signed)
Cardiology Office Note:    Date:  11/17/2019   ID:  Jon Gonzales, DOB 1961/01/07, MRN [id number]  PCP:  Lillard Anes, MD  Cardiologist:  Jenean Lindau, MD   Referring MD: Lillard Anes,*    ASSESSMENT:    1. Coronary artery disease involving native coronary artery of native heart without angina pectoris   2. Hypercholesterolemia   3. Essential hypertension   4. OSA (obstructive sleep apnea)    PLAN:    In order of problems listed above:  1. Coronary artery disease: Secondary prevention stressed to the patient.  Importance of compliance with diet and medication stressed and he vocalized understanding.  I told him to bring up his exercise to at least half an hour a day 5 days a week at a minimum.  He promises to do so.  Coronary angiography report was detailed to the patient. 2. Essential hypertension: Blood pressure is stable and he keeps a track of it. 3. Mixed dyslipidemia: His statin was doubled and he is having no issues with it.  He is taking it religiously.  He will be back in 3 months or earlier if he has any concerns.  I want him to have complete blood work including Chem-7 liver lipid check TSH and CBC and this needs to be done in a month and he tells me that he will get it done by his primary care physician and fax me a copy.  Patient had multiple questions which were answered to his satisfaction.   Medication Adjustments/Labs and Tests Ordered: Current medicines are reviewed at length with the patient today.  Concerns regarding medicines are outlined above.  No orders of the defined types were placed in this encounter.  No orders of the defined types were placed in this encounter.    No chief complaint on file.    History of Present Illness:    Jon Gonzales is a 59 y.o. male.  Patient has past medical history of coronary artery disease, essential hypertension and dyslipidemia.  He was evaluated by me for angina symptoms.  Patient has had CABG  surgery in the past.  The patient underwent coronary angiography with significant stenosis in the mid LAD.  Subsequently is done fine.  He is walking 10 to 15 minutes on a regular basis and trying to get his exercise and effort tolerance.  At the time of my evaluation, the patient is alert awake oriented and in no distress.  Past Medical History:  Diagnosis Date  . CAD (coronary artery disease)    Cath 2013 LAD 95% stenosis, D1 90% stenosis, RCA 20%, Circ 30%.  Previous stent to D1 2012.   EF 60%.    . Depression   . HTN (hypertension)   . Hyperlipidemia   . Low testosterone   . Nephrolithiasis   . PUD (peptic ulcer disease)   . TIA (transient ischemic attack)     Past Surgical History:  Procedure Laterality Date  . CHOLECYSTECTOMY    . CORONARY ARTERY BYPASS GRAFT  02/19/12   L - LAD, SVG - D1, SVG - OM  . CORONARY STENT INTERVENTION N/A 11/05/2019   Procedure: CORONARY STENT INTERVENTION;  Surgeon: Lorretta Harp, MD;  Location: [facility];  Service: Cardiovascular;  Laterality: N/A;  SVG to DIAG  . LEFT HEART CATH AND CORS/GRAFTS ANGIOGRAPHY N/A 11/05/2019   Procedure: LEFT HEART CATH AND CORS/GRAFTS ANGIOGRAPHY;  Surgeon: Lorretta Harp, MD;  [facility];  Service: Cardiovascular;  Laterality: N/A;  . RENAL ANGIOGRAPHY N/A 11/05/2019   Procedure: RENAL ANGIOGRAPHY;  Surgeon: Runell Gess, MD;  [facility];  Service: Cardiovascular;  Laterality: N/A;  . TONSILLECTOMY AND ADENOIDECTOMY    . WRIST SURGERY      Current Medications: Current Meds  Medication Sig  . acetaminophen (TYLENOL) 500 MG tablet Take 1,000 mg by mouth every 6 (six) hours as needed (for pain.).  Marland Kitchen aspirin EC 81 MG tablet Take 1 tablet (81 mg total) by mouth daily.  . benazepril (LOTENSIN) 20 MG tablet Take 20 mg by mouth 2 (two) times daily.   . clopidogrel (PLAVIX) 75 MG tablet Take 1 tablet (75 mg total) by mouth daily.  . DULoxetine (CYMBALTA) 60 MG capsule  Take 60 mg by mouth 2 (two) times daily.   . Eszopiclone (ESZOPICLONE) 3 MG TABS Take 3 mg by mouth at bedtime as needed (sleep). Take immediately before bedtime  . famotidine (PEPCID) 20 MG tablet Take 1 tablet (20 mg total) by mouth at bedtime.  Marland Kitchen lithium carbonate (LITHOBID) 300 MG CR tablet Take 300 mg by mouth 2 (two) times daily.   Marland Kitchen LORazepam (ATIVAN) 1 MG tablet Take 1 mg by mouth 2 (two) times daily.   Marland Kitchen losartan (COZAAR) 100 MG tablet Take 100 mg by mouth daily.  . metoprolol succinate (TOPROL-XL) 50 MG 24 hr tablet Take 150 mg by mouth daily. Take with or immediately following a meal.   . Multiple Vitamin (MULTIVITAMIN WITH MINERALS) TABS tablet Take 1 tablet by mouth daily. Men's Multivitamin  . nitroGLYCERIN (NITROSTAT) 0.4 MG SL tablet Place 0.4 mg under the tongue every 5 (five) minutes x 3 doses as needed for chest pain.   . rosuvastatin (CRESTOR) 20 MG tablet Take 1 tablet (20 mg total) by mouth at bedtime.     Allergies:   Trintellix [vortioxetine] and Penicillins   Social History   Socioeconomic History  . Marital status: Married    Spouse name: Not on file  . Number of children: Not on file  . Years of education: Not on file  . Highest education level: Bachelor's degree (e.g., BA, AB, BS)  Occupational History  . Not on file  Tobacco Use  . Smoking status: Never Smoker  . Smokeless tobacco: Never Used  Substance and Sexual Activity  . Alcohol use: Yes    Alcohol/week: 6.0 standard drinks    Types: 6 Cans of beer per week  . Drug use: Not on file  . Sexual activity: Not on file  Other Topics Concern  . 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     Social Determinants of Health   Financial Resource Strain:   . Difficulty of Paying Living Expenses: Not on file  Food Insecurity:   . Worried About Programme researcher, broadcasting/film/video in the Last Year: Not on file  . Ran Out of Food in the Last Year: Not  on file  Transportation Needs:   . Lack of Transportation (Medical): Not on file  . Lack of Transportation (Non-Medical): Not on file  Physical Activity:   . Days of Exercise per Week: Not on file  . Minutes of Exercise per Session: Not on file  Stress:   . Feeling of Stress : Not on file  Social Connections:   . Frequency of Communication with Friends and Family: Not on file  .  Frequency of Social Gatherings with Friends and Family: Not on file  . Attends Religious Services: Not on file  . Active Member of Clubs or Organizations: Not on file  . Attends Banker Meetings: Not on file  . Marital Status: Not on file     Family History: The patient's family history includes Aneurysm (age of onset: 42) in his mother; CAD (age of onset: 45) in his father; Healthy in his brother; Heart disease in his sister.  ROS:   Please see the history of present illness.    All other systems reviewed and are negative.  EKGs/Labs/Other Studies Reviewed:    The following studies were reviewed today: Study date: 11/05/19  MyChart Results Release  MyChart Status: Active Results Release  Physicians  Panel Physicians Referring Physician Case Authorizing Physician  Runell Gess, MD (Primary)    Procedures  CORONARY STENT INTERVENTION  LEFT HEART CATH AND CORS/GRAFTS ANGIOGRAPHY  RENAL ANGIOGRAPHY  Conclusion    Mid RCA lesion is 50% stenosed.  Prox Cx lesion is 90% stenosed.  Previously placed Mid Cx stent (unknown type) is widely patent.  Ost LAD to Prox LAD lesion is 80% stenosed.  Mid LAD lesion is 80% stenosed.  1st Diag lesion is 90% stenosed.  A drug-eluting stent was successfully placed using a SYNERGY XD 3.0X16.  Origin to Prox Graft lesion is 60% stenosed.  Post intervention, there is a 0% residual stenosis.  The left ventricular systolic function is normal.  LV end diastolic pressure is normal.  The left ventricular ejection fraction is 55-65% by  visual estimate.      Recent Labs: No results found for requested labs within last 8760 hours.  Recent Lipid Panel No results found for: CHOL, TRIG, HDL, CHOLHDL, VLDL, LDLCALC, LDLDIRECT  Physical Exam:    VS:  BP 108/84   Pulse 82   Ht 5\' 10"  (1.778 m)   Wt 213 lb (96.6 kg)   SpO2 98%   BMI 30.56 kg/m     Wt Readings from Last 3 Encounters:  11/17/19 213 lb (96.6 kg)  11/05/19 204 lb 6.4 oz (92.7 kg)  10/26/19 219 lb (99.3 kg)     GEN: Patient is in no acute distress HEENT: Normal NECK: No JVD; No carotid bruits LYMPHATICS: No lymphadenopathy CARDIAC: Hear sounds regular, 2/6 systolic murmur at the apex. RESPIRATORY:  Clear to auscultation without rales, wheezing or rhonchi  ABDOMEN: Soft, non-tender, non-distended MUSCULOSKELETAL:  No edema; No deformity  SKIN: Warm and dry NEUROLOGIC:  Alert and oriented x 3 PSYCHIATRIC:  Normal affect   Signed, 12/24/19, MD  11/17/2019 9:58 AM    [facility]

## 2019-11-18 ENCOUNTER — Ambulatory Visit: Payer: No Typology Code available for payment source | Admitting: Legal Medicine

## 2019-11-19 ENCOUNTER — Ambulatory Visit: Payer: No Typology Code available for payment source | Admitting: Legal Medicine

## 2019-11-19 ENCOUNTER — Ambulatory Visit: Payer: No Typology Code available for payment source | Admitting: Nurse Practitioner

## 2019-11-20 ENCOUNTER — Ambulatory Visit: Payer: No Typology Code available for payment source | Admitting: Legal Medicine

## 2019-11-22 ENCOUNTER — Other Ambulatory Visit: Payer: Self-pay | Admitting: Family Medicine

## 2019-11-24 ENCOUNTER — Other Ambulatory Visit: Payer: Self-pay | Admitting: Legal Medicine

## 2019-11-24 DIAGNOSIS — I2510 Atherosclerotic heart disease of native coronary artery without angina pectoris: Secondary | ICD-10-CM

## 2019-11-24 DIAGNOSIS — K2100 Gastro-esophageal reflux disease with esophagitis, without bleeding: Secondary | ICD-10-CM

## 2019-11-24 MED ORDER — FAMOTIDINE 20 MG PO TABS: 20.0000 mg | ORAL_TABLET | Freq: Every day | ORAL | 12 refills | Status: DC

## 2019-11-24 MED ORDER — CLOPIDOGREL BISULFATE 75 MG PO TABS: 75.0000 mg | ORAL_TABLET | Freq: Every day | ORAL | 2 refills | Status: DC

## 2019-11-24 MED ORDER — ROSUVASTATIN CALCIUM 20 MG PO TABS: 20.0000 mg | ORAL_TABLET | Freq: Every day | ORAL | 11 refills | Status: DC

## 2019-11-25 ENCOUNTER — Other Ambulatory Visit: Payer: Self-pay | Admitting: Legal Medicine

## 2019-11-25 ENCOUNTER — Ambulatory Visit: Payer: No Typology Code available for payment source | Admitting: Legal Medicine

## 2019-11-27 ENCOUNTER — Ambulatory Visit: Payer: No Typology Code available for payment source | Admitting: Cardiology

## 2019-11-29 MED FILL — FAMOTIDINE 20 MG TABLET: 20 | 30 days supply | Qty: 30 | Fill #0

## 2019-11-30 MED FILL — ROSUVASTATIN CALCIUM 20 MG: 20 | 30 days supply | Qty: 30 | Fill #0

## 2019-11-30 MED FILL — CLOPIDOGREL 75 MG TABLET: 75 | 90 days supply | Qty: 90 | Fill #0

## 2019-12-14 MED FILL — CloNIDine HCL 0.1 MG TAB: 0.1 | 30 days supply | Qty: 60 | Fill #2

## 2019-12-16 ENCOUNTER — Other Ambulatory Visit: Payer: Self-pay | Admitting: Legal Medicine

## 2019-12-16 DIAGNOSIS — I25810 Atherosclerosis of coronary artery bypass graft(s) without angina pectoris: Secondary | ICD-10-CM

## 2019-12-17 LAB — COMPREHENSIVE METABOLIC PANEL
ALT: 30 IU/L (ref 0–44)
AST: 26 IU/L (ref 0–40)
Albumin/Globulin Ratio: 1.7 (ref 1.2–2.2)
Albumin: 4.6 g/dL (ref 3.8–4.9)
Alkaline Phosphatase: 53 IU/L (ref 39–117)
BUN/Creatinine Ratio: 17 (ref 9–20)
BUN: 17 mg/dL (ref 6–24)
Bilirubin Total: 0.8 mg/dL (ref 0.0–1.2)
CO2: 22 mmol/L (ref 20–29)
Calcium: 9.1 mg/dL (ref 8.7–10.2)
Chloride: 105 mmol/L (ref 96–106)
Creatinine, Ser: 1.02 mg/dL (ref 0.76–1.27)
GFR calc Af Amer: 94 mL/min/{1.73_m2} (ref >59)
GFR calc non Af Amer: 81 mL/min/{1.73_m2} (ref >59)
Globulin, Total: 2.7 g/dL (ref 1.5–4.5)
Glucose: 112 mg/dL — ABNORMAL HIGH (ref 65–99)
Potassium: 4.9 mmol/L (ref 3.5–5.2)
Sodium: 142 mmol/L (ref 134–144)
Total Protein: 7.3 g/dL (ref 6.0–8.5)

## 2019-12-17 NOTE — Progress Notes (Signed)
Glucose 112, kidney and liver tests OK lp

## 2020-01-01 ENCOUNTER — Other Ambulatory Visit: Payer: Self-pay | Admitting: Legal Medicine

## 2020-01-01 DIAGNOSIS — I10 Essential (primary) hypertension: Secondary | ICD-10-CM

## 2020-01-01 MED ORDER — AMLODIPINE BESYLATE 10 MG PO TABS: 10.0000 mg | ORAL_TABLET | Freq: Every day | ORAL | 2 refills | Status: DC

## 2020-01-01 MED ORDER — LOSARTAN POTASSIUM 100 MG PO TABS: 100.0000 mg | ORAL_TABLET | Freq: Every day | ORAL | 2 refills | Status: DC

## 2020-01-01 MED FILL — FAMOTIDINE 20 MG TABLET: 20 | 30 days supply | Qty: 30 | Fill #1

## 2020-01-01 MED FILL — ROSUVASTATIN CALCIUM 20 MG: 20 | 30 days supply | Qty: 30 | Fill #1

## 2020-01-01 MED FILL — AMLODIPINE BESYLATE 10 MG T: 10 | 90 days supply | Qty: 90 | Fill #0

## 2020-01-02 ENCOUNTER — Other Ambulatory Visit: Payer: Self-pay | Admitting: Legal Medicine

## 2020-01-02 DIAGNOSIS — I10 Essential (primary) hypertension: Secondary | ICD-10-CM

## 2020-01-02 MED ORDER — AMLODIPINE BESYLATE 10 MG PO TABS: 10.0000 mg | ORAL_TABLET | Freq: Every day | ORAL | 1 refills | Status: DC

## 2020-01-02 MED ORDER — LOSARTAN POTASSIUM 100 MG PO TABS: 100.0000 mg | ORAL_TABLET | Freq: Every day | ORAL | 1 refills | Status: DC

## 2020-01-19 ENCOUNTER — Telehealth: Payer: Self-pay | Admitting: Family Medicine

## 2020-01-20 ENCOUNTER — Other Ambulatory Visit: Payer: Self-pay | Admitting: Legal Medicine

## 2020-01-20 ENCOUNTER — Ambulatory Visit (INDEPENDENT_AMBULATORY_CARE_PROVIDER_SITE_OTHER): Payer: No Typology Code available for payment source | Admitting: Legal Medicine

## 2020-01-20 ENCOUNTER — Other Ambulatory Visit: Payer: Self-pay

## 2020-01-20 ENCOUNTER — Encounter: Payer: Self-pay | Admitting: Legal Medicine

## 2020-01-20 DIAGNOSIS — B029 Zoster without complications: Secondary | ICD-10-CM

## 2020-01-20 DIAGNOSIS — I10 Essential (primary) hypertension: Secondary | ICD-10-CM

## 2020-01-20 DIAGNOSIS — F319 Bipolar disorder, unspecified: Secondary | ICD-10-CM

## 2020-01-20 DIAGNOSIS — K2100 Gastro-esophageal reflux disease with esophagitis, without bleeding: Secondary | ICD-10-CM

## 2020-01-20 DIAGNOSIS — E7800 Pure hypercholesterolemia, unspecified: Secondary | ICD-10-CM

## 2020-01-20 HISTORY — DX: Zoster without complications: B02.9

## 2020-01-20 MED ORDER — CLONIDINE HCL 0.1 MG PO TABS: 0.1000 mg | ORAL_TABLET | Freq: Two times a day (BID) | ORAL | 2 refills | Status: DC

## 2020-01-20 MED ORDER — FAMOTIDINE 20 MG PO TABS: 20.0000 mg | ORAL_TABLET | Freq: Every day | ORAL | 2 refills | Status: DC

## 2020-01-20 MED ORDER — ROSUVASTATIN CALCIUM 20 MG PO TABS: 20.0000 mg | ORAL_TABLET | Freq: Every day | ORAL | 2 refills | Status: DC

## 2020-01-20 MED ORDER — LITHIUM CARBONATE ER 300 MG PO TBCR: 300.0000 mg | EXTENDED_RELEASE_TABLET | Freq: Two times a day (BID) | ORAL | 2 refills | Status: DC

## 2020-01-20 MED ORDER — LOSARTAN POTASSIUM 100 MG PO TABS: 100.0000 mg | ORAL_TABLET | Freq: Every day | ORAL | 2 refills | Status: DC

## 2020-01-20 MED FILL — LOSARTAN-HCTZ 100-25 MG TAB: 100-25 | 90 days supply | Qty: 90 | Fill #0

## 2020-01-20 MED FILL — LITHIUM CARBONATE ER 300 MG: 300 | 90 days supply | Qty: 180 | Fill #3

## 2020-01-20 NOTE — Assessment & Plan Note (Signed)
Early rash right side of neck with papules.  It has the traits of early shingles.  Patient started on valcyclovir and prednisone.  He is to keep covered if around andy children.  Follow up one week

## 2020-01-20 NOTE — Progress Notes (Signed)
Established Patient Office Visit  Subjective:  Patient ID: Jon Gonzales, male    DOB: 03/28/1961  Age: 59 y.o. MRN: [id number]  CC:  Chief Complaint  Patient presents with  . Herpes Zoster    HPI Jon Gonzales presents for Patient started with rash on right side of neck retroauricular area with lancinating pain for 2 days and papules.  It is very sensitive to touch.  No adenopathy.  Suspect early herpes zoster.  Past Medical History:  Diagnosis Date  . CAD (coronary artery disease)    Cath 2013 LAD 95% stenosis, D1 90% stenosis, RCA 20%, Circ 30%.  Previous stent to D1 2012.   EF 60%.    . Depression   . HTN (hypertension)   . Hyperlipidemia   . Low testosterone   . Nephrolithiasis   . PUD (peptic ulcer disease)   . TIA (transient ischemic attack)     Past Surgical History:  Procedure Laterality Date  . CHOLECYSTECTOMY    . CORONARY ARTERY BYPASS GRAFT  02/19/12   L - LAD, SVG - D1, SVG - OM  . CORONARY STENT INTERVENTION N/A 11/05/2019   Procedure: CORONARY STENT INTERVENTION;  Surgeon: Runell Gess, MD;  [facility];  Service: Cardiovascular;  Laterality: N/A;  SVG to DIAG  . LEFT HEART CATH AND CORS/GRAFTS ANGIOGRAPHY N/A 11/05/2019   Procedure: LEFT HEART CATH AND CORS/GRAFTS ANGIOGRAPHY;  Surgeon: Runell Gess, MD;  [facility];  Service: Cardiovascular;  Laterality: N/A;  . RENAL ANGIOGRAPHY N/A 11/05/2019   Procedure: RENAL ANGIOGRAPHY;  Surgeon: Runell Gess, MD;  [facility];  Service: Cardiovascular;  Laterality: N/A;  . TONSILLECTOMY AND ADENOIDECTOMY    . WRIST SURGERY      Family History  Problem Relation Age of Onset  . CAD Father 32  . Aneurysm Mother 80       cerebral  . Heart disease Sister        Unclear  . Healthy Brother     Social History   Socioeconomic History  . Marital status: Married    Spouse name: Not on file  . Number of children: Not on file  . Years of education:  Not on file  . Highest education level: Bachelor's degree (e.g., BA, AB, BS)  Occupational History  . Not on file  Tobacco Use  . Smoking status: Never Smoker  . Smokeless tobacco: Never Used  Substance and Sexual Activity  . Alcohol use: Yes    Alcohol/week: 6.0 standard drinks    Types: 6 Cans of beer per week  . Drug use: Not on file  . Sexual activity: Not on file  Other Topics Concern  . 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     Social Determinants of Health   Financial Resource Strain:   . Difficulty of Paying Living Expenses:   Food Insecurity:   . Worried About Programme researcher, broadcasting/film/video in the Last Year:   . Barista in the Last Year:   Transportation Needs:   . Freight forwarder (Medical):   Marland Kitchen Lack of Transportation (Non-Medical):   Physical Activity:   . Days of Exercise per Week:   . Minutes of Exercise per Session:   Stress:   . Feeling of Stress :   Social Connections:   . Frequency  of Communication with Friends and Family:   . Frequency of Social Gatherings with Friends and Family:   . Attends Religious Services:   . Active Member of Clubs or Organizations:   . Attends Archivist Meetings:   Marland Kitchen Marital Status:   Intimate Partner Violence:   . Fear of Current or Ex-Partner:   . Emotionally Abused:   Marland Kitchen Physically Abused:   . Sexually Abused:     Outpatient Medications Prior to Visit  Medication Sig Dispense Refill  . acetaminophen (TYLENOL) 500 MG tablet Take 1,000 mg by mouth every 6 (six) hours as needed (for pain.).    Marland Kitchen amLODipine (NORVASC) 10 MG tablet Take 1 tablet (10 mg total) by mouth daily. 30 tablet 1  . aspirin EC 81 MG tablet Take 1 tablet (81 mg total) by mouth daily. 30 tablet 0  . benazepril (LOTENSIN) 20 MG tablet Take 20 mg by mouth 2 (two) times daily.     . clonazePAM (KLONOPIN) 1 MG tablet Take 1 mg by mouth 2 (two) times daily as needed.      . cloNIDine (CATAPRES) 0.1 MG tablet Take 1 tablet (0.1 mg total) by mouth 2 (two) times daily. 180 tablet 2  . clopidogrel (PLAVIX) 75 MG tablet Take 1 tablet (75 mg total) by mouth daily. 90 tablet 2  . DULoxetine (CYMBALTA) 60 MG capsule Take 60 mg by mouth 2 (two) times daily.     . Eszopiclone (ESZOPICLONE) 3 MG TABS Take 3 mg by mouth at bedtime as needed (sleep). Take immediately before bedtime    . famotidine (PEPCID) 20 MG tablet Take 1 tablet (20 mg total) by mouth at bedtime. 90 tablet 2  . lithium carbonate (LITHOBID) 300 MG CR tablet Take 1 tablet (300 mg total) by mouth 2 (two) times daily. 180 tablet 2  . LORazepam (ATIVAN) 1 MG tablet Take 1 mg by mouth 2 (two) times daily.     Marland Kitchen losartan (COZAAR) 100 MG tablet Take 1 tablet (100 mg total) by mouth daily. 90 tablet 2  . losartan-hydrochlorothiazide (HYZAAR) 100-25 MG tablet Take 1 tablet by mouth daily.    . metoprolol succinate (TOPROL-XL) 100 MG 24 hr tablet Take 100 mg by mouth daily.    . Multiple Vitamin (MULTIVITAMIN WITH MINERALS) TABS tablet Take 1 tablet by mouth daily. Men's Multivitamin    . nitroGLYCERIN (NITROSTAT) 0.4 MG SL tablet Place 0.4 mg under the tongue every 5 (five) minutes x 3 doses as needed for chest pain.     . rosuvastatin (CRESTOR) 20 MG tablet Take 1 tablet (20 mg total) by mouth at bedtime. 90 tablet 2   No facility-administered medications prior to visit.    Allergies  Allergen Reactions  . Trintellix [Vortioxetine] Other (See Comments)    Headaches.  . Penicillins Rash    Did it involve swelling of the face/tongue/throat, SOB, or low BP? Unknown Did it involve sudden or severe rash/hives, skin peeling, or any reaction on the inside of your mouth or nose? Unknown Did you need to seek medical attention at a hospital or doctor's office? Unknown When did it last happen?childhood reaction If all above answers are "NO", may proceed with cephalosporin use.     ROS Review of Systems   Constitutional: Negative.   HENT: Negative.   Eyes: Negative.   Respiratory: Negative.   Cardiovascular: Negative.   Gastrointestinal: Negative.   Endocrine: Negative.   Genitourinary: Negative.   Musculoskeletal: Negative.   Skin: Positive for  rash.  Neurological: Negative.       Objective:    Physical Exam  Constitutional: He appears well-developed.  HENT:  Head: Normocephalic and atraumatic.  Right Ear: External ear normal.  Left Ear: External ear normal.  Mouth/Throat: Oropharynx is clear and moist.  Eyes: Pupils are equal, round, and reactive to light. Conjunctivae and EOM are normal.  Neck:  Rash on right side of neck and retroauricular area.  Cardiovascular: Normal rate, regular rhythm and normal heart sounds.  Pulmonary/Chest: Effort normal and breath sounds normal.  Vitals reviewed.   BP 126/82   Pulse 77   Temp (!) 97.5 F (36.4 C)   Ht 5\' 10"  (1.778 m)   Wt 219 lb 6.4 oz (99.5 kg)   SpO2 95%   BMI 31.48 kg/m  Wt Readings from Last 3 Encounters:  01/20/20 219 lb 6.4 oz (99.5 kg)  11/17/19 213 lb (96.6 kg)  11/05/19 204 lb 6.4 oz (92.7 kg)     Health Maintenance Due  Topic Date Due  . Hepatitis C Screening  Never done  . HIV Screening  Never done  . TETANUS/TDAP  Never done  . COLONOSCOPY  Never done    There are no preventive care reminders to display for this patient.  No results found for: TSH No results found for: WBC, HGB, HCT, MCV, PLT Lab Results  Component Value Date   NA 142 12/16/2019   K 4.9 12/16/2019   CO2 22 12/16/2019   GLUCOSE 112 (H) 12/16/2019   BUN 17 12/16/2019   CREATININE 1.02 12/16/2019   BILITOT 0.8 12/16/2019   ALKPHOS 53 12/16/2019   AST 26 12/16/2019   ALT 30 12/16/2019   PROT 7.3 12/16/2019   ALBUMIN 4.6 12/16/2019   CALCIUM 9.1 12/16/2019   No results found for: CHOL No results found for: HDL No results found for: LDLCALC No results found for: TRIG No results found for: CHOLHDL No results found  for: HGBA1C    Assessment & Plan:   Problem List Items Addressed This Visit      Other   Herpes zoster    Early rash right side of neck with papules.  It has the traits of early shingles.  Patient started on valcyclovir and prednisone.  He is to keep covered if around andy children.  Follow up one week         No orders of the defined types were placed in this encounter.   Follow-up: No follow-ups on file.    02/15/2020, MD

## 2020-01-22 ENCOUNTER — Other Ambulatory Visit: Payer: Self-pay | Admitting: Legal Medicine

## 2020-01-22 MED FILL — DULOXETINE HCL 60 MG CPEP: 60 | 90 days supply | Qty: 180 | Fill #0

## 2020-01-29 ENCOUNTER — Encounter: Payer: Self-pay | Admitting: Legal Medicine

## 2020-01-29 ENCOUNTER — Encounter: Payer: Self-pay | Admitting: Cardiology

## 2020-01-29 ENCOUNTER — Other Ambulatory Visit: Payer: Self-pay

## 2020-01-29 ENCOUNTER — Ambulatory Visit (INDEPENDENT_AMBULATORY_CARE_PROVIDER_SITE_OTHER): Payer: No Typology Code available for payment source | Admitting: Legal Medicine

## 2020-01-29 ENCOUNTER — Telehealth: Payer: Self-pay

## 2020-01-29 ENCOUNTER — Ambulatory Visit (INDEPENDENT_AMBULATORY_CARE_PROVIDER_SITE_OTHER): Payer: No Typology Code available for payment source | Admitting: Cardiology

## 2020-01-29 VITALS — BP 120/90 | HR 74 | Ht 70.0 in | Wt 220.0 lb

## 2020-01-29 DIAGNOSIS — I209 Angina pectoris, unspecified: Secondary | ICD-10-CM | POA: Diagnosis not present

## 2020-01-29 DIAGNOSIS — I10 Essential (primary) hypertension: Secondary | ICD-10-CM

## 2020-01-29 DIAGNOSIS — I2510 Atherosclerotic heart disease of native coronary artery without angina pectoris: Secondary | ICD-10-CM

## 2020-01-29 DIAGNOSIS — R600 Localized edema: Secondary | ICD-10-CM | POA: Insufficient documentation

## 2020-01-29 DIAGNOSIS — Z9989 Dependence on other enabling machines and devices: Secondary | ICD-10-CM

## 2020-01-29 DIAGNOSIS — E785 Hyperlipidemia, unspecified: Secondary | ICD-10-CM

## 2020-01-29 DIAGNOSIS — G4733 Obstructive sleep apnea (adult) (pediatric): Secondary | ICD-10-CM

## 2020-01-29 HISTORY — DX: Localized edema: R60.0

## 2020-01-29 LAB — TROPONIN T: Troponin T (Highly Sensitive): 7 ng/L (ref 0–22)

## 2020-01-29 MED ORDER — RANOLAZINE ER 500 MG PO TB12: 500.0000 mg | ORAL_TABLET | Freq: Two times a day (BID) | ORAL | 0 refills | Status: DC

## 2020-01-29 MED ORDER — RANOLAZINE ER 500 MG PO TB12: 500.0000 mg | ORAL_TABLET | Freq: Two times a day (BID) | ORAL | 6 refills | Status: DC

## 2020-01-29 NOTE — Telephone Encounter (Signed)
[facility] called with a stat Troponin T of 7.

## 2020-01-29 NOTE — Telephone Encounter (Signed)
Called patient with his result - spoke with his wife.

## 2020-01-29 NOTE — Patient Instructions (Addendum)
Medication Instructions:   START TAKING RENEXA 500 MG TWICE A DAY   *If you need a refill on your cardiac medications before your next appointment, please call your pharmacy*   Lab Work: TROPONIN I SENSITIVITY TODAY    If you have labs (blood work) drawn today and your tests are completely normal, you will receive your results only by: Marland Kitchen MyChart Message (if you have MyChart) OR . A paper copy in the mail If you have any lab test that is abnormal or we need to change your treatment, we will call you to review the results.   Testing/Procedures: NONE ORDERED  TODAY    Follow-Up: At [facility], you and your health needs are our priority.  As part of our continuing mission to provide you with exceptional heart care, we have created designated Provider Care Teams.  These Care Teams include your primary Cardiologist (physician) and Advanced Practice Providers (APPs -  Physician Assistants and Nurse Practitioners) who all work together to provide you with the care you need, when you need it.  We recommend signing up for the patient portal called "MyChart".  Sign up information is provided on this After Visit Summary.  MyChart is used to connect with patients for Virtual Visits (Telemedicine).  Patients are able to view lab/test results, encounter notes, upcoming appointments, etc.  Non-urgent messages can be sent to your provider as well.   To learn more about what you can do with MyChart, go to [email or web address].    Your next appointment:  AS SCHEDULED    Other Instructions

## 2020-01-29 NOTE — Progress Notes (Signed)
Acute Office Visit  Subjective:    Patient ID: DANIAL HLAVAC, male    DOB: [birth year], 59 y.o.   MRN: [id number]  Chief Complaint  Patient presents with  . Chest Pain    HPI Patient is in today for Chest pain for 2 weeks.  CORONARY ARTERY DISEASE  Patient presents in follow up of CAD. Patient was diagnosed in many years a go and had CABG and 2 stents in 2021. The patient has no associated CHF. The patient is currently taking a beta blocker, statin, and aspirin. CAD was diagnosed years years ago.  Patient is having some angina. Patient has used no NTG.  Patient is followed by cardiology.  Patient had stent and CABG . Last angiography was 2021, last echocardiogram EF < 40%  The pain today is not exactly like his his angina but similiar  Patient has gastroesophageal reflux symptoms withesophagitis and LTRD.  The symptoms are moderate intensity.  Length of symptoms 20 years.  Medicines include pepcid.  Complications include none..  Past Medical History:  Diagnosis Date  . CAD (coronary artery disease)    Cath 2013 LAD 95% stenosis, D1 90% stenosis, RCA 20%, Circ 30%.  Previous stent to D1 2012.   EF 60%.    . Depression   . HTN (hypertension)   . Hyperlipidemia   . Low testosterone   . Nephrolithiasis   . PUD (peptic ulcer disease)   . TIA (transient ischemic attack)     Past Surgical History:  Procedure Laterality Date  . CHOLECYSTECTOMY    . CORONARY ARTERY BYPASS GRAFT  02/19/12   L - LAD, SVG - D1, SVG - OM  . CORONARY STENT INTERVENTION N/A 11/05/2019   Procedure: CORONARY STENT INTERVENTION;  Surgeon: Runell Gess, MD;  [facility];  Service: Cardiovascular;  Laterality: N/A;  SVG to DIAG  . LEFT HEART CATH AND CORS/GRAFTS ANGIOGRAPHY N/A 11/05/2019   Procedure: LEFT HEART CATH AND CORS/GRAFTS ANGIOGRAPHY;  Surgeon: Runell Gess, MD;  [facility];  Service: Cardiovascular;  Laterality: N/A;  . RENAL ANGIOGRAPHY N/A 11/05/2019   Procedure: RENAL ANGIOGRAPHY;  Surgeon: Runell Gess, MD;  [facility];  Service: Cardiovascular;  Laterality: N/A;  . TONSILLECTOMY AND ADENOIDECTOMY    . WRIST SURGERY      Family History  Problem Relation Age of Onset  . CAD Father 34  . Aneurysm Mother 14       cerebral  . Heart disease Sister        Unclear  . Healthy Brother     Social History   Socioeconomic History  . Marital status: Married    Spouse name: Not on file  . Number of children: Not on file  . Years of education: Not on file  . Highest education level: Bachelor's degree (e.g., BA, AB, BS)  Occupational History  . Not on file  Tobacco Use  . Smoking status: Never Smoker  . Smokeless tobacco: Never Used  Substance and Sexual Activity  . Alcohol use: Yes    Alcohol/week: 6.0 standard drinks    Types: 6 Cans of beer per week  . Drug use: Not on file  . Sexual activity: Not on file  Other Topics Concern  . 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     Social Determinants of Health  Financial Resource Strain:   . Difficulty of Paying Living Expenses:   Food Insecurity:   . Worried About Charity fundraiser in the Last Year:   . Arboriculturist in the Last Year:   Transportation Needs:   . Film/video editor (Medical):   Marland Kitchen Lack of Transportation (Non-Medical):   Physical Activity:   . Days of Exercise per Week:   . Minutes of Exercise per Session:   Stress:   . Feeling of Stress :   Social Connections:   . Frequency of Communication with Friends and Family:   . Frequency of Social Gatherings with Friends and Family:   . Attends Religious Services:   . Active Member of Clubs or Organizations:   . Attends Archivist Meetings:   Marland Kitchen Marital Status:   Intimate Partner Violence:   . Fear of Current or Ex-Partner:   . Emotionally Abused:   Marland Kitchen Physically Abused:   . Sexually Abused:      Outpatient Medications Prior to Visit  Medication Sig Dispense Refill  . acetaminophen (TYLENOL) 500 MG tablet Take 1,000 mg by mouth every 6 (six) hours as needed (for pain.).    Marland Kitchen amLODipine (NORVASC) 10 MG tablet Take 1 tablet (10 mg total) by mouth daily. 30 tablet 1  . aspirin EC 81 MG tablet Take 1 tablet (81 mg total) by mouth daily. 30 tablet 0  . benazepril (LOTENSIN) 20 MG tablet Take 20 mg by mouth 2 (two) times daily.     . clonazePAM (KLONOPIN) 1 MG tablet Take 1 mg by mouth 2 (two) times daily as needed.    . cloNIDine (CATAPRES) 0.1 MG tablet Take 1 tablet (0.1 mg total) by mouth 2 (two) times daily. 180 tablet 2  . clopidogrel (PLAVIX) 75 MG tablet Take 1 tablet (75 mg total) by mouth daily. 90 tablet 2  . DULoxetine (CYMBALTA) 60 MG capsule TAKE 1 CAPSULE BY MOUTH TWICE DAILY 180 capsule 2  . Eszopiclone (ESZOPICLONE) 3 MG TABS Take 3 mg by mouth at bedtime as needed (sleep). Take immediately before bedtime    . famotidine (PEPCID) 20 MG tablet Take 1 tablet (20 mg total) by mouth at bedtime. 90 tablet 2  . lithium carbonate (LITHOBID) 300 MG CR tablet Take 1 tablet (300 mg total) by mouth 2 (two) times daily. 180 tablet 2  . LORazepam (ATIVAN) 1 MG tablet Take 1 mg by mouth 2 (two) times daily.     Marland Kitchen losartan (COZAAR) 100 MG tablet Take 1 tablet (100 mg total) by mouth daily. 90 tablet 2  . losartan-hydrochlorothiazide (HYZAAR) 100-25 MG tablet Take 1 tablet by mouth daily.    . metoprolol succinate (TOPROL-XL) 100 MG 24 hr tablet Take 100 mg by mouth daily.    . Multiple Vitamin (MULTIVITAMIN WITH MINERALS) TABS tablet Take 1 tablet by mouth daily. Men's Multivitamin    . nitroGLYCERIN (NITROSTAT) 0.4 MG SL tablet Place 0.4 mg under the tongue every 5 (five) minutes x 3 doses as needed for chest pain.     . rosuvastatin (CRESTOR) 20 MG tablet Take 1 tablet (20 mg total) by mouth at bedtime. 90 tablet 2   No facility-administered medications prior to visit.    Allergies   Allergen Reactions  . Trintellix [Vortioxetine] Other (See Comments)    Headaches.  . Penicillins Rash    Did it involve swelling of the face/tongue/throat, SOB, or low BP? Unknown Did it involve sudden or severe rash/hives, skin  peeling, or any reaction on the inside of your mouth or nose? Unknown Did you need to seek medical attention at a hospital or doctor's office? Unknown When did it last happen?childhood reaction If all above answers are "NO", may proceed with cephalosporin use.     Review of Systems  Constitutional: Negative.   HENT: Negative.   Eyes: Negative.   Respiratory: Negative.   Cardiovascular: Positive for chest pain.  Endocrine: Negative.   Skin: Negative.   Neurological: Negative.   Psychiatric/Behavioral: Negative.        Objective:    Physical Exam Vitals reviewed.  Constitutional:      Appearance: He is well-developed.  HENT:     Head: Normocephalic and atraumatic.  Cardiovascular:     Rate and Rhythm: Normal rate and regular rhythm.     Heart sounds: Normal heart sounds.  Pulmonary:     Effort: Pulmonary effort is normal.     Breath sounds: Normal breath sounds.  Abdominal:     Palpations: Abdomen is soft.  Musculoskeletal:     Cervical back: Normal range of motion and neck supple.  Skin:    General: Skin is warm.  Neurological:     General: No focal deficit present.   EKG no acute changes, no axis change.  BP 122/90 (BP Location: Right Arm, Patient Position: Sitting)   Pulse 74   Temp 98 F (36.7 C) (Temporal)   Ht 5\' 10"  (1.778 m)   Wt 213 lb (96.6 kg)   SpO2 94%   BMI 30.56 kg/m  Wt Readings from Last 3 Encounters:  01/29/20 213 lb (96.6 kg)  01/20/20 219 lb 6.4 oz (99.5 kg)  11/17/19 213 lb (96.6 kg)    Health Maintenance Due  Topic Date Due  . Hepatitis C Screening  Never done  . HIV Screening  Never done  . TETANUS/TDAP  Never done  . COLONOSCOPY  Never done    There are no preventive care reminders to  display for this patient.   No results found for: TSH No results found for: WBC, HGB, HCT, MCV, PLT Lab Results  Component Value Date   NA 142 12/16/2019   K 4.9 12/16/2019   CO2 22 12/16/2019   GLUCOSE 112 (H) 12/16/2019   BUN 17 12/16/2019   CREATININE 1.02 12/16/2019   BILITOT 0.8 12/16/2019   ALKPHOS 53 12/16/2019   AST 26 12/16/2019   ALT 30 12/16/2019   PROT 7.3 12/16/2019   ALBUMIN 4.6 12/16/2019   CALCIUM 9.1 12/16/2019   No results found for: CHOL No results found for: HDL No results found for: LDLCALC No results found for: TRIG No results found for: CHOLHDL No results found for: 02/15/2020     Assessment & Plan:   Problem List Items Addressed This Visit      Cardiovascular and Mediastinum   Angina pectoris (HCC)    Patient's pain has resolved without NTG, but due to his SOB, I called Dr. [id number] who instructed me to send him to office.          No orders of the defined types were placed in this encounter.    Macy Mis, MD

## 2020-01-29 NOTE — Addendum Note (Signed)
Addended by: Brent Bulla on: 01/29/2020 11:33 AM   Modules accepted: Orders

## 2020-01-29 NOTE — Progress Notes (Signed)
Cardiology Office Note:    Date:  01/30/2020   ID:  Jon Gonzales, DOB 1961/01/30, MRN [id number]  PCP:  [name], MD  Cardiologist:  Garwin Brothers, MD  Electrophysiologist:  None   Referring MD: [name],*   Chief Complaint  Patient presents with  . Chest Pain    History of Present Illness:    Jon Gonzales is a 59 y.o. male with a hx of CAD status post coronary artery bypass grafting x3 in [facility] in 2012 LIMA to LAD, SVG to diagonal and obtuse marginal, PCI with his most recent drug-eluting synergy XD stent in January 2021, hypertension, hyperlipidemia and history of transient ischemic attack.  The patient presents today as he has been experiencing intermittent chest pain describes it as dull sensation which starts midsternal and go up to his mid right chest. First he was concerned that this may have been residual effects from his recent shingles infection. He notes that he notice this becoming more now on a daily basis. He states that this sensation will last for a few minutes at a time but today he started to experience this dull right sided sensation with lasted for about an hour. He reports that this was the first time [name]. It lasted for an hour and resolved on its own. He has not tried nitroglycerin as he tells me they gave him significant headaches when he does this.  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    Past Medical History:  Diagnosis Date  . CAD (coronary artery disease)    Cath 2013 LAD 95% stenosis, D1 90% stenosis, RCA 20%, Circ 30%.  Previous stent to D1 2012.   EF 60%.    . Depression   . HTN (hypertension)   . Hyperlipidemia   . Low testosterone   . Nephrolithiasis   . PUD (peptic ulcer disease)   . TIA (transient ischemic attack)     Past Surgical History:  Procedure Laterality Date  . CHOLECYSTECTOMY     . CORONARY ARTERY BYPASS GRAFT  02/19/12   L - LAD, SVG - D1, SVG - OM  . CORONARY STENT INTERVENTION N/A 11/05/2019   Procedure: CORONARY STENT INTERVENTION;  Surgeon: Runell Gess, MD;  [facility];  Service: Cardiovascular;  Laterality: N/A;  SVG to DIAG  . LEFT HEART CATH AND CORS/GRAFTS ANGIOGRAPHY N/A 11/05/2019   Procedure: LEFT HEART CATH AND CORS/GRAFTS ANGIOGRAPHY;  Surgeon: Runell Gess, MD;  [facility];  Service: Cardiovascular;  Laterality: N/A;  . RENAL ANGIOGRAPHY N/A 11/05/2019   Procedure: RENAL ANGIOGRAPHY;  Surgeon: Runell Gess, MD;  [facility];  Service: Cardiovascular;  Laterality: N/A;  . TONSILLECTOMY AND ADENOIDECTOMY    . WRIST SURGERY      Current Medications: Current Meds  Medication Sig  . acetaminophen (TYLENOL) 500 MG tablet Take 1,000 mg by mouth every 6 (six) hours as needed (for pain.).  Marland Kitchen amLODipine (NORVASC) 10 MG tablet Take 1 tablet (10 mg total) by mouth daily.  Marland Kitchen aspirin EC 81 MG tablet Take 1 tablet (81 mg total) by mouth daily.  . clopidogrel (PLAVIX) 75 MG tablet Take 1 tablet (75 mg total) by mouth daily.  . DULoxetine (CYMBALTA) 60 MG capsule TAKE 1 CAPSULE BY MOUTH TWICE DAILY  . Eszopiclone (ESZOPICLONE)  3 MG TABS Take 3 mg by mouth at bedtime as needed (sleep). Take immediately before bedtime  . famotidine (PEPCID) 20 MG tablet Take 1 tablet (20 mg total) by mouth at bedtime.  . fexofenadine (ALLEGRA) 60 MG tablet Take 60 mg by mouth daily.  Marland Kitchen gabapentin (NEURONTIN) 600 MG tablet Take 300 mg by mouth 3 (three) times daily.  Marland Kitchen lithium carbonate (LITHOBID) 300 MG CR tablet Take 1 tablet (300 mg total) by mouth 2 (two) times daily.  Marland Kitchen LORazepam (ATIVAN) 1 MG tablet Take 1 mg by mouth 2 (two) times daily.   Marland Kitchen losartan (COZAAR) 100 MG tablet Take 1 tablet (100 mg total) by mouth daily.  . metoprolol succinate (TOPROL-XL) 100 MG 24 hr tablet Take 100 mg by mouth daily.  . metoprolol  tartrate (LOPRESSOR) 50 MG tablet Take 50 mg by mouth daily.  . Multiple Vitamin (MULTIVITAMIN WITH MINERALS) TABS tablet Take 1 tablet by mouth daily. Men's Multivitamin  . nitroGLYCERIN (NITROSTAT) 0.4 MG SL tablet Place 0.4 mg under the tongue every 5 (five) minutes x 3 doses as needed for chest pain.   . predniSONE (DELTASONE) 20 MG tablet TAKE 4 TABLETS BY MOUTH ONCE DAILY FOR 3 DAYS, THEN TAKE 3 TABS DAILY FOR 3 DAYS, THEN TAKE 2 TABS DAILY FOR 3 DAYS, THEN TAKE 1 TABLET FOR  . rosuvastatin (CRESTOR) 20 MG tablet Take 1 tablet (20 mg total) by mouth at bedtime.  . valACYclovir (VALTREX) 500 MG tablet Take 500 mg by mouth 3 (three) times daily.     Allergies:   Trintellix [vortioxetine] and Penicillins   Social History   Socioeconomic History  . Marital status: Married    Spouse name: Not on file  . Number of children: Not on file  . Years of education: Not on file  . Highest education level: Bachelor's degree (e.g., BA, AB, BS)  Occupational History  . Not on file  Tobacco Use  . Smoking status: Never Smoker  . Smokeless tobacco: Never Used  Substance and Sexual Activity  . Alcohol use: Yes    Alcohol/week: 6.0 standard drinks    Types: 6 Cans of beer per week  . Drug use: Not on file  . Sexual activity: Not on file  Other Topics Concern  . 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     Social Determinants of Health   Financial Resource Strain:   . Difficulty of Paying Living Expenses:   Food Insecurity:   . Worried About Charity fundraiser in the Last Year:   . Arboriculturist in the Last Year:   Transportation Needs:   . Film/video editor (Medical):   Marland Kitchen Lack of Transportation (Non-Medical):   Physical Activity:   . Days of Exercise per Week:   . Minutes of Exercise per Session:   Stress:   . Feeling of Stress :   Social Connections:   . Frequency of Communication with Friends and  Family:   . Frequency of Social Gatherings with Friends and Family:   . Attends Religious Services:   . Active Member of Clubs or Organizations:   . Attends Archivist Meetings:   Marland Kitchen Marital Status:      Family History: The patient's family history includes Aneurysm (age of onset: 31) in his mother; CAD (age of onset: 67) in his father; Healthy in his brother; Heart  disease in his sister.  ROS:   Review of Systems  Constitution: Negative for decreased appetite, fever and weight gain.  HENT: Negative for congestion, ear discharge, hoarse voice and sore throat.   Eyes: Negative for discharge, redness, vision loss in right eye and visual halos.  Cardiovascular: Negative for chest pain, dyspnea on exertion, leg swelling, orthopnea and palpitations.  Respiratory: Negative for cough, hemoptysis, shortness of breath and snoring.   Endocrine: Negative for heat intolerance and polyphagia.  Hematologic/Lymphatic: Negative for bleeding problem. Does not bruise/bleed easily.  Skin: Negative for flushing, nail changes, rash and suspicious lesions.  Musculoskeletal: Negative for arthritis, joint pain, muscle cramps, myalgias, neck pain and stiffness.  Gastrointestinal: Negative for abdominal pain, bowel incontinence, diarrhea and excessive appetite.  Genitourinary: Negative for decreased libido, genital sores and incomplete emptying.  Neurological: Negative for brief paralysis, focal weakness, headaches and loss of balance.  Psychiatric/Behavioral: Negative for altered mental status, depression and suicidal ideas.  Allergic/Immunologic: Negative for HIV exposure and persistent infections.    EKGs/Labs/Other Studies Reviewed:    The following studies were reviewed today:   EKG:  None today  LHC 11/05/19  Mid RCA lesion is 50% stenosed.  Prox Cx lesion is 90% stenosed.  Previously placed Mid Cx stent (unknown type) is widely patent.  Ost LAD to Prox LAD lesion is 80% stenosed.   Mid LAD lesion is 80% stenosed.  1st Diag lesion is 90% stenosed.  A drug-eluting stent was successfully placed using a SYNERGY XD 3.0X16.  Origin to Prox Graft lesion is 60% stenosed.  Post intervention, there is a 0% residual stenosis.  The left ventricular systolic function is normal.  LV end diastolic pressure is normal.  The left ventricular ejection fraction is 55-65% by visual estimate.   Recent Labs: 12/16/2019: ALT 30; BUN 17; Creatinine, Ser 1.02; Potassium 4.9; Sodium 142  Recent Lipid Panel No results found for: CHOL, TRIG, HDL, CHOLHDL, VLDL, LDLCALC, LDLDIRECT  Physical Exam:    VS:  BP 120/90 (BP Location: Right Arm, Patient Position: Sitting, Cuff Size: Normal)   Pulse 74   Ht 5\' 10"  (1.778 m)   Wt 220 lb (99.8 kg)   SpO2 98%   BMI 31.57 kg/m     Wt Readings from Last 3 Encounters:  01/29/20 220 lb (99.8 kg)  01/29/20 213 lb (96.6 kg)  01/20/20 219 lb 6.4 oz (99.5 kg)     GEN: Well nourished, well developed in no acute distress HEENT: Normal NECK: No JVD; No carotid bruits LYMPHATICS: No lymphadenopathy CARDIAC: S1S2 noted,RRR, no murmurs, rubs, gallops RESPIRATORY:  Clear to auscultation without rales, wheezing or rhonchi  ABDOMEN: Soft, non-tender, non-distended, +bowel sounds, no guarding. EXTREMITIES: No edema, No cyanosis, no clubbing MUSCULOSKELETAL:  No deformity  SKIN: Warm and dry NEUROLOGIC:  Alert and oriented x 3, non-focal PSYCHIATRIC:  Normal affect, good insight  ASSESSMENT:    1. Essential hypertension   2. Coronary artery disease involving native coronary artery of native heart without angina pectoris   3. Angina pectoris (HCC)   4. Bilateral leg edema   5. OSA on CPAP   6. Dyslipidemia    PLAN:     CAD -his symptoms are concerning for stable angina.  He has not tried nitroglycerin as it does give him debilitating headache.  With this I would like to do a trial of Ranexa 500 mg twice a day.  In addition given his symptoms  that occurred this morning though now he has resolved, I  am going to also send a stat high-sensitivity troponin completeness which came back within normal limits.  He remains on his regimen which includes dual antiplatelet therapy with aspirin and Plavix, Lopressor and has Crestor 20 mg daily.  In regards to the finding of  +1 bilateral leg edema found on his physical exam -this could be multifactorial due to his amlodipine versus mild chronic leg venous insufficiency.  For now he will try to use support stockings when he is on his feet, if this does not work it be worth decreasing his amlodipine dose or taking him off completely to see if this resolves.  Of course a trial of Lasix could always be our last result.  Mild diastolic hypertension ( 90 mmHg) -we will continue patient on his current medication regimen for now.  I have encouraged patient to decrease his intake of  salt products.   Dyslipidemia - continue patient on his Crestor 20 mg daily.  OSA - continue with CPAP.  The patient is in agreement with the above plan. The patient left the office in stable condition.  The patient will follow up with Dr. Mamie Nickavenkaur as schedule.    Medication Adjustments/Labs and Tests Ordered: Current medicines are reviewed at length with the patient today.  Concerns regarding medicines are outlined above.  Orders Placed This Encounter  Procedures  . Troponin T, STAT ([facility])  . EKG 12-Lead   Meds ordered this encounter  Medications  . ranolazine (RANEXA) 500 MG 12 hr tablet    Sig: Take 1 tablet (500 mg total) by mouth 2 (two) times daily.    Dispense:  60 tablet    Refill:  6    Patient Instructions  Medication Instructions:   START TAKING RENEXA 500 MG TWICE A DAY   *If you need a refill on your cardiac medications before your next appointment, please call your pharmacy*   Lab Work: TROPONIN I SENSITIVITY TODAY    If you have labs (blood work) drawn today and your tests are completely  normal, you will receive your results only by: Marland Kitchen. MyChart Message (if you have MyChart) OR . A paper copy in the mail If you have any lab test that is abnormal or we need to change your treatment, we will call you to review the results.   Testing/Procedures: NONE ORDERED  TODAY    Follow-Up: At [facility], you and your health needs are our priority.  As part of our continuing mission to provide you with exceptional heart care, we have created designated Provider Care Teams.  These Care Teams include your primary Cardiologist (physician) and Advanced Practice Providers (APPs -  Physician Assistants and Nurse Practitioners) who all work together to provide you with the care you need, when you need it.  We recommend signing up for the patient portal called "MyChart".  Sign up information is provided on this After Visit Summary.  MyChart is used to connect with patients for Virtual Visits (Telemedicine).  Patients are able to view lab/test results, encounter notes, upcoming appointments, etc.  Non-urgent messages can be sent to your provider as well.   To learn more about what you can do with MyChart, go to [email or web address].    Your next appointment:  AS SCHEDULED    Other Instructions      Adopting a Healthy Lifestyle.  Know what a healthy weight is for you (roughly BMI <25) and aim to maintain this   Aim for 7+ servings of fruits and vegetables daily  65-80+ fluid ounces of water or unsweet tea for healthy kidneys   Limit to max 1 drink of alcohol per day; avoid smoking/tobacco   Limit animal fats in diet for cholesterol and heart health - choose grass fed whenever available   Avoid highly processed foods, and foods high in saturated/trans fats   Aim for low stress - take time to unwind and care for your mental health   Aim for 150 min of moderate intensity exercise weekly for heart health, and weights twice weekly for bone health   Aim for 7-9 hours of sleep daily    When it comes to diets, agreement about the perfect plan isnt easy to find, even among the experts. Experts at the [facility] [employer] developed an idea known as the Healthy Eating Plate. Just imagine a plate divided into logical, healthy portions.   The emphasis is on diet quality:   Load up on vegetables and fruits - one-half of your plate: Aim for color and variety, and remember that potatoes dont count.   Go for whole grains - one-quarter of your plate: Whole wheat, barley, wheat berries, quinoa, oats, brown rice, and foods made with them. If you want pasta, go with whole wheat pasta.   Protein power - one-quarter of your plate: Fish, chicken, beans, and nuts are all healthy, versatile protein sources. Limit red meat.   The diet, however, does go beyond the plate, offering a few other suggestions.   Use healthy plant oils, such as olive, canola, soy, corn, sunflower and peanut. Check the labels, and avoid partially hydrogenated oil, which have unhealthy trans fats.   If youre thirsty, drink water. Coffee and tea are good in moderation, but skip sugary drinks and limit milk and dairy products to one or two daily servings.   The type of carbohydrate in the diet is more important than the amount. Some sources of carbohydrates, such as vegetables, fruits, whole grains, and beans-are healthier than others.   Finally, stay active  Signed, Thomasene Ripple, DO  01/30/2020 12:02 AM    [facility]

## 2020-01-29 NOTE — Assessment & Plan Note (Signed)
Patient's pain has resolved without NTG, but due to his SOB, I called Dr. Macy Mis who instructed me to send him to office.

## 2020-02-03 ENCOUNTER — Other Ambulatory Visit: Payer: Self-pay | Admitting: Legal Medicine

## 2020-02-03 DIAGNOSIS — K2100 Gastro-esophageal reflux disease with esophagitis, without bleeding: Secondary | ICD-10-CM

## 2020-02-03 DIAGNOSIS — E7800 Pure hypercholesterolemia, unspecified: Secondary | ICD-10-CM

## 2020-02-03 DIAGNOSIS — F5101 Primary insomnia: Secondary | ICD-10-CM

## 2020-02-03 MED ORDER — FAMOTIDINE 20 MG PO TABS: 20.0000 mg | ORAL_TABLET | Freq: Two times a day (BID) | ORAL | 2 refills | Status: DC

## 2020-02-03 MED ORDER — ESZOPICLONE 3 MG PO TABS: 3.0000 mg | ORAL_TABLET | Freq: Every evening | ORAL | 2 refills | Status: DC | PRN

## 2020-02-03 MED ORDER — ROSUVASTATIN CALCIUM 20 MG PO TABS: 20.0000 mg | ORAL_TABLET | Freq: Every day | ORAL | 2 refills | Status: DC

## 2020-02-03 MED FILL — FAMOTIDINE 20 MG TABLET: 20 | 90 days supply | Qty: 180 | Fill #0

## 2020-02-03 MED FILL — ROSUVASTATIN CALCIUM 20 MG: 20 | 90 days supply | Qty: 90 | Fill #0

## 2020-02-03 MED FILL — ESZOPICLONE 3 MG TABS: 3 | 90 days supply | Qty: 90 | Fill #0

## 2020-02-26 ENCOUNTER — Other Ambulatory Visit: Payer: Self-pay

## 2020-02-26 MED ORDER — RANOLAZINE ER 500 MG PO TB12: 500.0000 mg | ORAL_TABLET | Freq: Two times a day (BID) | ORAL | 1 refills | Status: DC

## 2020-03-01 ENCOUNTER — Ambulatory Visit (INDEPENDENT_AMBULATORY_CARE_PROVIDER_SITE_OTHER): Payer: No Typology Code available for payment source | Admitting: Cardiology

## 2020-03-01 ENCOUNTER — Other Ambulatory Visit: Payer: Self-pay | Admitting: Cardiology

## 2020-03-01 ENCOUNTER — Other Ambulatory Visit: Payer: Self-pay

## 2020-03-01 ENCOUNTER — Encounter: Payer: Self-pay | Admitting: Cardiology

## 2020-03-01 VITALS — BP 110/82 | HR 92 | Ht 70.0 in | Wt 215.0 lb

## 2020-03-01 DIAGNOSIS — E7800 Pure hypercholesterolemia, unspecified: Secondary | ICD-10-CM | POA: Diagnosis not present

## 2020-03-01 DIAGNOSIS — I2510 Atherosclerotic heart disease of native coronary artery without angina pectoris: Secondary | ICD-10-CM

## 2020-03-01 DIAGNOSIS — I209 Angina pectoris, unspecified: Secondary | ICD-10-CM | POA: Diagnosis not present

## 2020-03-01 DIAGNOSIS — I10 Essential (primary) hypertension: Secondary | ICD-10-CM | POA: Diagnosis not present

## 2020-03-01 MED ORDER — RANOLAZINE ER 1000 MG PO TB12: 1000.0000 mg | ORAL_TABLET | Freq: Two times a day (BID) | ORAL | 3 refills | Status: DC

## 2020-03-01 MED FILL — RANOLAZINE ER 1000 MG TB12: 1000 | 90 days supply | Qty: 180 | Fill #0

## 2020-03-01 NOTE — Patient Instructions (Signed)
Medication Instructions:  Your physician has recommended you make the following change in your medication:   Start Ranexa 1 gm twice daily.  *If you need a refill on your cardiac medications before your next appointment, please call your pharmacy*   Lab Work: None ordered If you have labs (blood work) drawn today and your tests are completely normal, you will receive your results only by: Marland Kitchen MyChart Message (if you have MyChart) OR . A paper copy in the mail If you have any lab test that is abnormal or we need to change your treatment, we will call you to review the results.   Testing/Procedures: None ordered   Follow-Up: At [facility], you and your health needs are our priority.  As part of our continuing mission to provide you with exceptional heart care, we have created designated Provider Care Teams.  These Care Teams include your primary Cardiologist (physician) and Advanced Practice Providers (APPs -  Physician Assistants and Nurse Practitioners) who all work together to provide you with the care you need, when you need it.  We recommend signing up for the patient portal called "MyChart".  Sign up information is provided on this After Visit Summary.  MyChart is used to connect with patients for Virtual Visits (Telemedicine).  Patients are able to view lab/test results, encounter notes, upcoming appointments, etc.  Non-urgent messages can be sent to your provider as well.   To learn more about what you can do with MyChart, go to [email or web address].    Your next appointment:   3 month(s)  The format for your next appointment:   In Person  Provider:   Belva Crome, MD   Other Instructions NA

## 2020-03-01 NOTE — Progress Notes (Signed)
Cardiology Office Note:    Date:  03/01/2020   ID:  Jon Gonzales, DOB [birth year], MRN [id number]  PCP:  Jon Anes, MD  Cardiologist:  Jon Lindau, MD   Referring MD: Jon Gonzales,*    ASSESSMENT:    1. Coronary artery disease involving native coronary artery of native heart without angina pectoris   2. Hypercholesterolemia   3. Angina pectoris ([place])   4. Essential hypertension    PLAN:    In order of problems listed above:  1. Coronary artery disease: Secondary prevention stressed with the patient.  Importance of compliance with diet and medication stressed and he vocalized understanding.  His angina is stable I have doubled his Ranexa to 1 g twice daily.  He will be seen in follow-up appointment in 3 months or earlier if he has any concerns.  I told him to get an EKG in 2 to 3 weeks at his office as he works for a [place] office and his primary care physician will do an EKG and fax it to me.  He is agreeable.  He will also get blood work before his next appointment and bring it to me for follow-up. 2. Essential hypertension: Blood pressure stable 3. Mixed dyslipidemia: Diet was emphasized. 4. I strongly urged the patient to join cardiac rehab program and exercise on a regular basis.  His angina is stable he is agreeable.  He wants to think about it and get back to me. 5. Patient will be seen in follow-up appointment in 3 months or earlier if the patient has any concerns    Medication Adjustments/Labs and Tests Ordered: Current medicines are reviewed at length with the patient today.  Concerns regarding medicines are outlined above.  No orders of the defined types were placed in this encounter.  No orders of the defined types were placed in this encounter.    Chief Complaint  Patient presents with  . Follow-up     History of Present Illness:    Jon Gonzales is a 59 y.o. male.  Patient has past medical history of coronary artery disease,  essential hypertension dyslipidemia and is post bypass surgery.  Recently has had a coronary intervention.  He denies any problems at this time and takes care of activities of daily living.  Overall he leads a sedentary lifestyle.  No chest pain orthopnea or PND.  At the time of my evaluation, the patient is alert awake oriented and in no distress.  My partner saw him recently and recommended some medications and changes in lifestyle and is trying to pursue this.  Past Medical History:  Diagnosis Date  . Angina pectoris ([place]) 10/26/2019  . Anxiety, generalized 05/22/2016  . Bilateral leg edema 01/29/2020  . CAD (coronary artery disease)    Cath 2013 LAD 95% stenosis, D1 90% stenosis, RCA 20%, Circ 30%.  Previous stent to D1 2012.   EF 60%.    . Class 2 obesity due to excess calories without serious comorbidity in adult 09/11/2016  . Confusional arousals 09/11/2016  . Depression   . Excessive daytime sleepiness 09/11/2016  . Herpes zoster 01/20/2020  . HTN (hypertension)   . Hypercholesterolemia 05/22/2016  . Hyperlipidemia   . Hypertension 05/22/2016  . Hypogonadism in male 05/22/2016  . Low testosterone   . Nephrolithiasis   . OSA (obstructive sleep apnea) 09/11/2016  . OSA on CPAP 01/03/2017  . 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 Paranoid reaction, acute ([place]) 05/22/2016  .  PLMD (periodic limb movement disorder) 09/11/2016  . PUD (peptic ulcer disease)   . Screening for prostate cancer 05/22/2016  . Severe episode of recurrent major depressive disorder, without psychotic features ([facility]) 09/11/2016  . Snoring 09/11/2016  . TIA (transient ischemic attack)     Past Surgical History:  Procedure Laterality Date  . CHOLECYSTECTOMY    . CORONARY ARTERY BYPASS GRAFT  02/19/12   L - LAD, SVG - D1, SVG - OM  . CORONARY STENT INTERVENTION N/A 11/05/2019   Procedure: CORONARY STENT INTERVENTION;  Surgeon: Jon Gess, MD;  [facility];  Service:  Cardiovascular;  Laterality: N/A;  SVG to DIAG  . LEFT HEART CATH AND CORS/GRAFTS ANGIOGRAPHY N/A 11/05/2019   Procedure: LEFT HEART CATH AND CORS/GRAFTS ANGIOGRAPHY;  Surgeon: Jon Gess, MD;  Location: [facility];  Service: Cardiovascular;  Laterality: N/A;  . RENAL ANGIOGRAPHY N/A 11/05/2019   Procedure: RENAL ANGIOGRAPHY;  Surgeon: Jon Gess, MD;  Location: [facility];  Service: Cardiovascular;  Laterality: N/A;  . TONSILLECTOMY AND ADENOIDECTOMY    . WRIST SURGERY      Current Medications: Current Meds  Medication Sig  . acetaminophen (TYLENOL) 500 MG tablet Take 1,000 mg by mouth every 6 (six) hours as needed (for pain.).  Marland Kitchen amLODipine (NORVASC) 10 MG tablet Take 1 tablet (10 mg total) by mouth daily.  Marland Kitchen aspirin EC 81 MG tablet Take 1 tablet (81 mg total) by mouth daily.  . clopidogrel (PLAVIX) 75 MG tablet Take 1 tablet (75 mg total) by mouth daily.  . DULoxetine (CYMBALTA) 60 MG capsule TAKE 1 CAPSULE BY MOUTH TWICE DAILY  . Eszopiclone (ESZOPICLONE) 3 MG TABS Take 1 tablet (3 mg total) by mouth at bedtime as needed (sleep). Take immediately before bedtime  . famotidine (PEPCID) 20 MG tablet Take 1 tablet (20 mg total) by mouth 2 (two) times daily.  . fexofenadine (ALLEGRA) 60 MG tablet Take 60 mg by mouth daily.  Marland Kitchen gabapentin (NEURONTIN) 600 MG tablet Take 300 mg by mouth 3 (three) times daily.  Marland Kitchen lithium carbonate (LITHOBID) 300 MG CR tablet Take 1 tablet (300 mg total) by mouth 2 (two) times daily.  Marland Kitchen LORazepam (ATIVAN) 1 MG tablet Take 1 mg by mouth 2 (two) times daily.   Marland Kitchen losartan (COZAAR) 100 MG tablet Take 1 tablet (100 mg total) by mouth daily.  . metoprolol succinate (TOPROL-XL) 100 MG 24 hr tablet Take 100 mg by mouth daily.  . metoprolol tartrate (LOPRESSOR) 50 MG tablet Take 50 mg by mouth daily.  . Multiple Vitamin (MULTIVITAMIN WITH MINERALS) TABS tablet Take 1 tablet by mouth daily. Men's Multivitamin  . nitroGLYCERIN (NITROSTAT) 0.4 MG  SL tablet Place 0.4 mg under the tongue every 5 (five) minutes x 3 doses as needed for chest pain.   . ranolazine (RANEXA) 500 MG 12 hr tablet Take 1 tablet (500 mg total) by mouth 2 (two) times daily.  . rosuvastatin (CRESTOR) 20 MG tablet Take 1 tablet (20 mg total) by mouth at bedtime.  . valACYclovir (VALTREX) 500 MG tablet Take 500 mg by mouth 3 (three) times daily.     Allergies:   Trintellix [vortioxetine] and Penicillins   Social History   Socioeconomic History  . Marital status: Married    Spouse name: Not on file  . Number of children: Not on file  . Years of education: Not on file  . Highest education level: Bachelor's degree (e.g., BA, AB, BS)  Occupational History  . Not on file  Tobacco Use  . Smoking status: Never Smoker  . Smokeless tobacco: Never Used  Substance and Sexual Activity  . Alcohol use: Yes    Alcohol/week: 6.0 standard drinks    Types: 6 Cans of beer per week  . Drug use: Not on file  . Sexual activity: Not on file  Other Topics Concern  . 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     Social Determinants of Health   Financial Resource Strain:   . Difficulty of Paying Living Expenses:   Food Insecurity:   . Worried About Programme researcher, broadcasting/film/video in the Last Year:   . Barista in the Last Year:   Transportation Needs:   . Freight forwarder (Medical):   Marland Kitchen Lack of Transportation (Non-Medical):   Physical Activity:   . Days of Exercise per Week:   . Minutes of Exercise per Session:   Stress:   . Feeling of Stress :   Social Connections:   . Frequency of Communication with Friends and Family:   . Frequency of Social Gatherings with Friends and Family:   . Attends Religious Services:   . Active Member of Clubs or Organizations:   . Attends Banker Meetings:   Marland Kitchen Marital Status:      Family History: The patient's family history includes Aneurysm  (age of onset: 5) in his mother; CAD (age of onset: 27) in his father; Healthy in his brother; Heart disease in his sister.  ROS:   Please see the history of present illness.    All other systems reviewed and are negative.  EKGs/Labs/Other Studies Reviewed:    The following studies were reviewed today: I discussed my findings with the patient at length   Recent Labs: 12/16/2019: ALT 30; BUN 17; Creatinine, Ser 1.02; Potassium 4.9; Sodium 142  Recent Lipid Panel No results found for: CHOL, TRIG, HDL, CHOLHDL, VLDL, LDLCALC, LDLDIRECT  Physical Exam:    VS:  BP 110/82   Pulse 92   Ht 5\' 10"  (1.778 m)   SpO2 97%   BMI 31.57 kg/m     Wt Readings from Last 3 Encounters:  01/29/20 220 lb (99.8 kg)  01/29/20 213 lb (96.6 kg)  01/20/20 219 lb 6.4 oz (99.5 kg)     GEN: Patient is in no acute distress HEENT: Normal NECK: No JVD; No carotid bruits LYMPHATICS: No lymphadenopathy CARDIAC: Hear sounds regular, 2/6 systolic murmur at the apex. RESPIRATORY:  Clear to auscultation without rales, wheezing or rhonchi  ABDOMEN: Soft, non-tender, non-distended MUSCULOSKELETAL:  No edema; No deformity  SKIN: Warm and dry NEUROLOGIC:  Alert and oriented x 3 PSYCHIATRIC:  Normal affect   Signed, 03/21/20, MD  03/01/2020 9:43 AM    [facility]

## 2020-03-03 ENCOUNTER — Other Ambulatory Visit: Payer: Self-pay

## 2020-03-03 MED ORDER — LORAZEPAM 1 MG PO TABS: 1.0000 mg | ORAL_TABLET | Freq: Two times a day (BID) | ORAL | 2 refills | Status: DC

## 2020-03-03 MED FILL — LORazepam 1 MG TABS: 1 | 90 days supply | Qty: 180 | Fill #0

## 2020-03-19 MED FILL — METOPROLOL SUCCINATE ER 100: 100 | 90 days supply | Qty: 90 | Fill #0

## 2020-03-29 MED FILL — AMLODIPINE BESYLATE 10 MG T: 10 | 90 days supply | Qty: 90 | Fill #1

## 2020-04-19 MED FILL — LOSARTAN-HCTZ 100-25 MG TAB: 100-25 | 90 days supply | Qty: 90 | Fill #1

## 2020-04-19 MED FILL — DULOXETINE HCL 60 MG CPEP: 60 | 90 days supply | Qty: 180 | Fill #1

## 2020-04-19 MED FILL — LITHIUM CARBONATE ER 300 MG: 300 | 90 days supply | Qty: 180 | Fill #0

## 2020-05-02 ENCOUNTER — Other Ambulatory Visit: Payer: Self-pay | Admitting: Legal Medicine

## 2020-05-02 DIAGNOSIS — I10 Essential (primary) hypertension: Secondary | ICD-10-CM

## 2020-05-02 MED ORDER — DILTIAZEM HCL ER 60 MG PO CP12: 60.0000 mg | ORAL_CAPSULE | Freq: Two times a day (BID) | ORAL | 2 refills | Status: DC

## 2020-05-02 MED FILL — DILTIAZEM HCL ER 60 MG CP12: 60 | 90 days supply | Qty: 180 | Fill #0

## 2020-05-02 MED FILL — FAMOTIDINE 20 MG TABLET: 20 | 90 days supply | Qty: 180 | Fill #1

## 2020-05-02 MED FILL — ROSUVASTATIN CALCIUM 20 MG: 20 | 90 days supply | Qty: 90 | Fill #1

## 2020-05-02 MED FILL — ESZOPICLONE 3 MG TABS: 3 | 90 days supply | Qty: 90 | Fill #1

## 2020-05-09 ENCOUNTER — Other Ambulatory Visit: Payer: Self-pay | Admitting: Legal Medicine

## 2020-05-09 MED ORDER — CLONIDINE HCL 0.1 MG PO TABS: 0.1000 mg | ORAL_TABLET | Freq: Two times a day (BID) | ORAL | 2 refills | Status: DC

## 2020-05-09 MED FILL — CloNIDine HCL 0.1 MG TAB: 0.1 | 30 days supply | Qty: 60 | Fill #0

## 2020-05-24 MED FILL — CLOPIDOGREL 75 MG TABLET: 75 | 90 days supply | Qty: 90 | Fill #2

## 2020-05-24 MED FILL — LORazepam 1 MG TABS: 1 | 90 days supply | Qty: 180 | Fill #1

## 2020-06-07 ENCOUNTER — Other Ambulatory Visit: Payer: Self-pay | Admitting: Legal Medicine

## 2020-06-07 DIAGNOSIS — I10 Essential (primary) hypertension: Secondary | ICD-10-CM

## 2020-06-07 DIAGNOSIS — E7800 Pure hypercholesterolemia, unspecified: Secondary | ICD-10-CM

## 2020-06-08 LAB — CBC WITH DIFFERENTIAL/PLATELET
Basophils Absolute: 0 10*3/uL (ref 0.0–0.2)
Basos: 1 %
EOS (ABSOLUTE): 0.4 10*3/uL (ref 0.0–0.4)
Eos: 6 %
Hematocrit: 41.1 % (ref 37.5–51.0)
Hemoglobin: 14 g/dL (ref 13.0–17.7)
Immature Grans (Abs): 0 10*3/uL (ref 0.0–0.1)
Immature Granulocytes: 0 %
Lymphocytes Absolute: 1.2 10*3/uL (ref 0.7–3.1)
Lymphs: 20 %
MCH: 32.8 pg (ref 26.6–33.0)
MCHC: 34.1 g/dL (ref 31.5–35.7)
MCV: 96 fL (ref 79–97)
Monocytes Absolute: 0.6 10*3/uL (ref 0.1–0.9)
Monocytes: 10 %
Neutrophils Absolute: 3.9 10*3/uL (ref 1.4–7.0)
Neutrophils: 63 %
Platelets: 172 10*3/uL (ref 150–450)
RBC: 4.27 x10E6/uL (ref 4.14–5.80)
RDW: 12.8 % (ref 11.6–15.4)
WBC: 6.1 10*3/uL (ref 3.4–10.8)

## 2020-06-08 LAB — COMPREHENSIVE METABOLIC PANEL
ALT: 16 IU/L (ref 0–44)
AST: 19 IU/L (ref 0–40)
Albumin/Globulin Ratio: 1.9 (ref 1.2–2.2)
Albumin: 4.6 g/dL (ref 3.8–4.9)
Alkaline Phosphatase: 48 IU/L (ref 48–121)
BUN/Creatinine Ratio: 14 (ref 9–20)
BUN: 17 mg/dL (ref 6–24)
Bilirubin Total: 0.9 mg/dL (ref 0.0–1.2)
CO2: 24 mmol/L (ref 20–29)
Calcium: 9.1 mg/dL (ref 8.7–10.2)
Chloride: 103 mmol/L (ref 96–106)
Creatinine, Ser: 1.24 mg/dL (ref 0.76–1.27)
GFR calc Af Amer: 74 mL/min/{1.73_m2} (ref >59)
GFR calc non Af Amer: 64 mL/min/{1.73_m2} (ref >59)
Globulin, Total: 2.4 g/dL (ref 1.5–4.5)
Glucose: 104 mg/dL — ABNORMAL HIGH (ref 65–99)
Potassium: 4 mmol/L (ref 3.5–5.2)
Sodium: 142 mmol/L (ref 134–144)
Total Protein: 7 g/dL (ref 6.0–8.5)

## 2020-06-08 LAB — LIPID PANEL
Chol/HDL Ratio: 2.6 ratio (ref 0.0–5.0)
Cholesterol, Total: 145 mg/dL (ref 100–199)
HDL: 55 mg/dL (ref >39)
LDL Chol Calc (NIH): 69 mg/dL (ref 0–99)
Triglycerides: 119 mg/dL (ref 0–149)
VLDL Cholesterol Cal: 21 mg/dL (ref 5–40)

## 2020-06-08 LAB — CARDIOVASCULAR RISK ASSESSMENT

## 2020-06-08 NOTE — Progress Notes (Signed)
CBC normal, glucose 104, kidney and liver tests and potassium normal, Cholesterol good LDL-C <70 lp

## 2020-06-13 ENCOUNTER — Other Ambulatory Visit: Payer: Self-pay | Admitting: Legal Medicine

## 2020-06-13 DIAGNOSIS — T782XXA Anaphylactic shock, unspecified, initial encounter: Secondary | ICD-10-CM

## 2020-06-13 MED ORDER — EPINEPHRINE 0.3 MG/0.3ML IJ SOAJ: 0.3000 mg | Freq: Once | INTRAMUSCULAR | Status: DC

## 2020-06-13 MED FILL — METOPROLOL SUCCINATE ER 100: 100 | 90 days supply | Qty: 90 | Fill #1

## 2020-06-13 NOTE — Progress Notes (Unsigned)
.  epi

## 2020-06-14 ENCOUNTER — Ambulatory Visit: Payer: No Typology Code available for payment source | Admitting: Cardiology

## 2020-06-14 ENCOUNTER — Other Ambulatory Visit: Payer: Self-pay | Admitting: Legal Medicine

## 2020-06-14 DIAGNOSIS — T782XXA Anaphylactic shock, unspecified, initial encounter: Secondary | ICD-10-CM

## 2020-06-14 MED ORDER — EPINEPHRINE 0.3 MG/0.3ML IJ SOAJ: 0.3000 mg | Freq: Once | INTRAMUSCULAR | Status: DC

## 2020-06-17 ENCOUNTER — Other Ambulatory Visit: Payer: Self-pay | Admitting: Legal Medicine

## 2020-06-17 DIAGNOSIS — T782XXA Anaphylactic shock, unspecified, initial encounter: Secondary | ICD-10-CM

## 2020-06-17 MED ORDER — EPINEPHRINE 0.3 MG/0.3ML IJ SOAJ: 0.3000 mg | INTRAMUSCULAR | 3 refills | Status: DC | PRN

## 2020-06-17 MED FILL — EPINEPHRINE 0.3 MG AUTO-INJ: 0.3 | 30 days supply | Qty: 2 | Fill #0

## 2020-06-23 ENCOUNTER — Ambulatory Visit: Payer: No Typology Code available for payment source

## 2020-06-28 ENCOUNTER — Ambulatory Visit: Payer: No Typology Code available for payment source

## 2020-06-28 ENCOUNTER — Other Ambulatory Visit: Payer: Self-pay

## 2020-06-28 DIAGNOSIS — Z23 Encounter for immunization: Secondary | ICD-10-CM

## 2020-06-28 NOTE — Progress Notes (Signed)
   Covid-19 Vaccination Clinic  Name:  Jon Gonzales    MRN: [id number] DOB: 09/11/1961  06/28/2020  Mr. Jon Gonzales was observed post Covid-19 immunization for 15 minutes without incident. He was provided with Vaccine Information Sheet and instruction to access the V-Safe system.   Mr. Jon Gonzales was instructed to call 911 with any severe reactions post vaccine: Marland Kitchen Difficulty breathing  . Swelling of face and throat  . A fast heartbeat  . A bad rash all over body  . Dizziness and weakness

## 2020-07-04 ENCOUNTER — Other Ambulatory Visit: Payer: Self-pay

## 2020-07-04 DIAGNOSIS — K279 Peptic ulcer, site unspecified, unspecified as acute or chronic, without hemorrhage or perforation: Secondary | ICD-10-CM | POA: Insufficient documentation

## 2020-07-04 DIAGNOSIS — R7989 Other specified abnormal findings of blood chemistry: Secondary | ICD-10-CM | POA: Insufficient documentation

## 2020-07-04 DIAGNOSIS — G459 Transient cerebral ischemic attack, unspecified: Secondary | ICD-10-CM | POA: Insufficient documentation

## 2020-07-04 DIAGNOSIS — F32A Depression, unspecified: Secondary | ICD-10-CM | POA: Insufficient documentation

## 2020-07-04 DIAGNOSIS — E785 Hyperlipidemia, unspecified: Secondary | ICD-10-CM | POA: Insufficient documentation

## 2020-07-04 DIAGNOSIS — N200 Calculus of kidney: Secondary | ICD-10-CM | POA: Insufficient documentation

## 2020-07-04 DIAGNOSIS — I10 Essential (primary) hypertension: Secondary | ICD-10-CM | POA: Insufficient documentation

## 2020-07-05 ENCOUNTER — Ambulatory Visit (INDEPENDENT_AMBULATORY_CARE_PROVIDER_SITE_OTHER): Payer: No Typology Code available for payment source | Admitting: Cardiology

## 2020-07-05 ENCOUNTER — Other Ambulatory Visit: Payer: Self-pay

## 2020-07-05 ENCOUNTER — Encounter: Payer: Self-pay | Admitting: Cardiology

## 2020-07-05 VITALS — BP 133/86 | HR 78 | Ht 70.0 in | Wt 221.8 lb

## 2020-07-05 DIAGNOSIS — E7800 Pure hypercholesterolemia, unspecified: Secondary | ICD-10-CM

## 2020-07-05 DIAGNOSIS — I2510 Atherosclerotic heart disease of native coronary artery without angina pectoris: Secondary | ICD-10-CM

## 2020-07-05 DIAGNOSIS — I10 Essential (primary) hypertension: Secondary | ICD-10-CM

## 2020-07-05 NOTE — Progress Notes (Signed)
Cardiology Office Note:    Date:  07/05/2020   ID:  Jon Gonzales, DOB 1961/03/29, MRN [id number]  PCP:  [name], MD  Cardiologist:  Garwin Brothers, MD   Referring MD: [name],*    ASSESSMENT:    1. Coronary artery disease involving native coronary artery of native heart without angina pectoris   2. Essential hypertension   3. Hypercholesterolemia    PLAN:    In order of problems listed above:  1. Coronary artery disease: Secondary prevention stressed with the patient.  Importance of compliance with diet medication stressed any vocalized understanding. 2. Essential hypertension: Blood pressure stable and diet was emphasized. 3. Mixed dyslipidemia: Diet was emphasized.  Lipids were reviewed and they are fine and he is happy with it. 4. Importance of regular exercise stressed I told the patient to walk at least half an hour a day 5 days a week and he promises to do so. 5. Patient will be seen in follow-up appointment in 6 months or earlier if the patient has any concerns    Medication Adjustments/Labs and Tests Ordered: Current medicines are reviewed at length with the patient today.  Concerns regarding medicines are outlined above.  No orders of the defined types were placed in this encounter.  No orders of the defined types were placed in this encounter.    No chief complaint on file.    History of Present Illness:    Jon Gonzales is a 59 y.o. male.  Patient has past medical history of coronary artery disease, essential hypertension, dyslipidemia, bypass surgery.  He denies any problems at this time and takes care of activities of daily living.  No chest pain orthopnea or PND.  He leads a sedentary lifestyle.  He does not exercise on a regimented way regular basis.  At the time of my evaluation, the patient is alert awake oriented and in no distress.  Past Medical History:  Diagnosis Date  . Angina pectoris ([facility]) 10/26/2019  . Anxiety,  generalized 05/22/2016  . Bilateral leg edema 01/29/2020  . CAD (coronary artery disease)    Cath 2013 LAD 95% stenosis, D1 90% stenosis, RCA 20%, Circ 30%.  Previous stent to D1 2012.   EF 60%.    . Class 2 obesity due to excess calories without serious comorbidity in adult 09/11/2016  . Confusional arousals 09/11/2016  . Depression   . Excessive daytime sleepiness 09/11/2016  . Herpes zoster 01/20/2020  . HTN (hypertension)   . Hypercholesterolemia 05/22/2016  . Hyperlipidemia   . Hypertension 05/22/2016  . Hypogonadism in male 05/22/2016  . Low testosterone   . Nephrolithiasis   . OSA (obstructive sleep apnea) 09/11/2016  . OSA on CPAP 01/03/2017  . 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 Paranoid reaction, acute ([facility]) 05/22/2016  . PLMD (periodic limb movement disorder) 09/11/2016  . PUD (peptic ulcer disease)   . Screening for prostate cancer 05/22/2016  . Severe episode of recurrent major depressive disorder, without psychotic features ([facility]) 09/11/2016  . Snoring 09/11/2016  . TIA (transient ischemic attack)     Past Surgical History:  Procedure Laterality Date  . CHOLECYSTECTOMY    . CORONARY ARTERY BYPASS GRAFT  02/19/12   L - LAD, SVG - D1, SVG - OM  . CORONARY STENT INTERVENTION N/A 11/05/2019   Procedure: CORONARY STENT INTERVENTION;  Surgeon: Runell Gess, MD;  [facility];  Service: Cardiovascular;  Laterality: N/A;  SVG to  DIAG  . LEFT HEART CATH AND CORS/GRAFTS ANGIOGRAPHY N/A 11/05/2019   Procedure: LEFT HEART CATH AND CORS/GRAFTS ANGIOGRAPHY;  Surgeon: Runell Gess, MD;  [facility];  Service: Cardiovascular;  Laterality: N/A;  . RENAL ANGIOGRAPHY N/A 11/05/2019   Procedure: RENAL ANGIOGRAPHY;  Surgeon: Runell Gess, MD;  [facility];  Service: Cardiovascular;  Laterality: N/A;  . TONSILLECTOMY AND ADENOIDECTOMY    . WRIST SURGERY      Current Medications: Current Meds  Medication Sig  .  acetaminophen (TYLENOL) 500 MG tablet Take 1,000 mg by mouth every 6 (six) hours as needed (for pain.).  Marland Kitchen aspirin EC 81 MG tablet Take 1 tablet (81 mg total) by mouth daily.  . cloNIDine (CATAPRES) 0.1 MG tablet Take 1 tablet (0.1 mg total) by mouth 2 (two) times daily.  . clopidogrel (PLAVIX) 75 MG tablet Take 1 tablet (75 mg total) by mouth daily.  Marland Kitchen diltiazem (CARDIZEM SR) 60 MG 12 hr capsule Take 1 capsule (60 mg total) by mouth 2 (two) times daily.  . DULoxetine (CYMBALTA) 60 MG capsule TAKE 1 CAPSULE BY MOUTH TWICE DAILY  . EPINEPHrine (EPIPEN 2-PAK) 0.3 mg/0.3 mL IJ SOAJ injection Inject 0.3 mLs (0.3 mg total) into the muscle as needed for anaphylaxis.  . Eszopiclone (ESZOPICLONE) 3 MG TABS Take 1 tablet (3 mg total) by mouth at bedtime as needed (sleep). Take immediately before bedtime  . famotidine (PEPCID) 20 MG tablet Take 1 tablet (20 mg total) by mouth 2 (two) times daily.  . fexofenadine (ALLEGRA) 60 MG tablet Take 60 mg by mouth daily.  Marland Kitchen lithium carbonate (LITHOBID) 300 MG CR tablet Take 1 tablet (300 mg total) by mouth 2 (two) times daily.  Marland Kitchen LORazepam (ATIVAN) 1 MG tablet Take 1 tablet (1 mg total) by mouth 2 (two) times daily.  Marland Kitchen losartan-hydrochlorothiazide (HYZAAR) 100-25 MG tablet Take 1 tablet by mouth daily.  . metoprolol succinate (TOPROL-XL) 100 MG 24 hr tablet Take 100 mg by mouth daily.  . Multiple Vitamin (MULTIVITAMIN WITH MINERALS) TABS tablet Take 1 tablet by mouth daily. Men's Multivitamin  . nitroGLYCERIN (NITROSTAT) 0.4 MG SL tablet Place 0.4 mg under the tongue every 5 (five) minutes x 3 doses as needed for chest pain.   . ranolazine (RANEXA) 1000 MG SR tablet Take 1 tablet (1,000 mg total) by mouth 2 (two) times daily.  . rosuvastatin (CRESTOR) 20 MG tablet Take 1 tablet (20 mg total) by mouth at bedtime.  . [place] 450 MG CAPS Take 450 mg by mouth 2 (two) times daily.   Current Facility-Administered Medications for the 07/05/20 encounter (Office Visit)  with Gamaliel Charney, Aundra Dubin, MD  Medication  . EPINEPHrine (EPI-PEN) injection 0.3 mg  . EPINEPHrine (EPI-PEN) injection 0.3 mg     Allergies:   Trintellix [vortioxetine] and Penicillins   Social History   Socioeconomic History  . Marital status: Married    Spouse name: Not on file  . Number of children: Not on file  . Years of education: Not on file  . Highest education level: Bachelor's degree (e.g., BA, AB, BS)  Occupational History  . Not on file  Tobacco Use  . Smoking status: Never Smoker  . Smokeless tobacco: Never Used  Vaping Use  . Vaping Use: Never used  Substance and Sexual Activity  . Alcohol use: Yes    Alcohol/week: 6.0 standard drinks    Types: 6 Cans of beer per week  . Drug use: Not  on file  . Sexual activity: Not on file  Other Topics Concern  . 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     Social Determinants of Health   Financial Resource Strain:   . Difficulty of Paying Living Expenses: Not on file  Food Insecurity:   . Worried About Programme researcher, broadcasting/film/video in the Last Year: Not on file  . Ran Out of Food in the Last Year: Not on file  Transportation Needs:   . Lack of Transportation (Medical): Not on file  . Lack of Transportation (Non-Medical): Not on file  Physical Activity:   . Days of Exercise per Week: Not on file  . Minutes of Exercise per Session: Not on file  Stress:   . Feeling of Stress : Not on file  Social Connections:   . Frequency of Communication with Friends and Family: Not on file  . Frequency of Social Gatherings with Friends and Family: Not on file  . Attends Religious Services: Not on file  . Active Member of Clubs or Organizations: Not on file  . Attends Banker Meetings: Not on file  . Marital Status: Not on file     Family History: The patient's family history includes Aneurysm (age of onset: 70) in his mother; CAD (age of onset: 18) in  his father; Healthy in his brother; Heart disease in his sister.  ROS:   Please see the history of present illness.    All other systems reviewed and are negative.  EKGs/Labs/Other Studies Reviewed:    The following studies were reviewed today: I discussed my findings with the patient.   Recent Labs: 06/07/2020: ALT 16; BUN 17; Creatinine, Ser 1.24; Hemoglobin 14.0; Platelets 172; Potassium 4.0; Sodium 142  Recent Lipid Panel    Component Value Date/Time   CHOL 145 06/07/2020 0808   TRIG 119 06/07/2020 0808   HDL 55 06/07/2020 0808   CHOLHDL 2.6 06/07/2020 0808   LDLCALC 69 06/07/2020 0808    Physical Exam:    VS:  BP 133/86   Pulse 78   Ht 5\' 10"  (1.778 m)   Wt 221 lb 12.8 oz (100.6 kg)   SpO2 97%   BMI 31.82 kg/m     Wt Readings from Last 3 Encounters:  07/05/20 221 lb 12.8 oz (100.6 kg)  03/01/20 215 lb (97.5 kg)  01/29/20 220 lb (99.8 kg)     GEN: Patient is in no acute distress HEENT: Normal NECK: No JVD; No carotid bruits LYMPHATICS: No lymphadenopathy CARDIAC: Hear sounds regular, 2/6 systolic murmur at the apex. RESPIRATORY:  Clear to auscultation without rales, wheezing or rhonchi   MUSCULOSKELETAL:  No edema; No deformity  SKIN: Warm and dry NEUROLOGIC:  Alert and oriented x 3 PSYCHIATRIC:  Normal affect   Signed, 01/31/20, MD  07/05/2020 4:31 PM     [facility]

## 2020-07-05 NOTE — Patient Instructions (Signed)

## 2020-07-07 ENCOUNTER — Other Ambulatory Visit: Payer: Self-pay | Admitting: Legal Medicine

## 2020-07-07 DIAGNOSIS — F411 Generalized anxiety disorder: Secondary | ICD-10-CM

## 2020-07-07 MED ORDER — LORAZEPAM 1 MG PO TABS: 1.0000 mg | ORAL_TABLET | Freq: Three times a day (TID) | ORAL | 2 refills | Status: DC

## 2020-07-10 MED FILL — LITHIUM CARBONATE ER 300 MG: 300 | 90 days supply | Qty: 180 | Fill #1

## 2020-07-11 MED FILL — RANOLAZINE ER 1000 MG TB12: 1000 | 90 days supply | Qty: 180 | Fill #1

## 2020-07-25 ENCOUNTER — Other Ambulatory Visit: Payer: Self-pay

## 2020-07-25 ENCOUNTER — Other Ambulatory Visit: Payer: Self-pay | Admitting: Legal Medicine

## 2020-07-25 ENCOUNTER — Ambulatory Visit (INDEPENDENT_AMBULATORY_CARE_PROVIDER_SITE_OTHER): Payer: No Typology Code available for payment source | Admitting: Legal Medicine

## 2020-07-25 ENCOUNTER — Encounter: Payer: Self-pay | Admitting: Legal Medicine

## 2020-07-25 VITALS — BP 110/68 | HR 68 | Temp 97.5°F | Ht 70.0 in | Wt 225.0 lb

## 2020-07-25 DIAGNOSIS — K279 Peptic ulcer, site unspecified, unspecified as acute or chronic, without hemorrhage or perforation: Secondary | ICD-10-CM

## 2020-07-25 DIAGNOSIS — R42 Dizziness and giddiness: Secondary | ICD-10-CM | POA: Diagnosis not present

## 2020-07-25 DIAGNOSIS — R0600 Dyspnea, unspecified: Secondary | ICD-10-CM

## 2020-07-25 DIAGNOSIS — R0609 Other forms of dyspnea: Secondary | ICD-10-CM

## 2020-07-25 DIAGNOSIS — H8103 Meniere's disease, bilateral: Secondary | ICD-10-CM

## 2020-07-25 LAB — PULMONARY FUNCTION TEST
FEV1/FVC: 65 %
FEV1: 2.64 L
FVC: 3.4 L

## 2020-07-25 MED ORDER — LANSOPRAZOLE 30 MG PO CPDR: 30.0000 mg | DELAYED_RELEASE_CAPSULE | Freq: Every day | ORAL | 2 refills | Status: DC

## 2020-07-25 MED ORDER — PREDNISONE 20 MG PO TABS: 20.0000 mg | ORAL_TABLET | Freq: Three times a day (TID) | ORAL | 0 refills | Status: DC

## 2020-07-25 MED FILL — LANSOPRAZOLE 30 MG CPDR: 30 | 90 days supply | Qty: 90 | Fill #0

## 2020-07-25 NOTE — Patient Instructions (Signed)
Meniere Disease  Meniere disease is an inner ear disorder. It causes attacks of a spinning sensation (vertigo), dizziness, and ringing in the ear (tinnitus). It also causes hearing loss and a feeling of fullness or pressure in the ear. This is a lifelong condition, and it may get worse over time. You may have drop attacks or severe dizziness that makes you fall. A drop attack is when you suddenly fall without losing consciousness and you quickly recover after a few seconds or minutes. What are the causes? This condition is caused by having too much of the fluid that is in your inner ear (endolymph). When fluid builds up in your inner ear, it affects the nerves that control balance and hearing. The reason for the fluid buildup is not known. Possible causes include:  Allergies.  An abnormal reaction of the body's defense system (autoimmune disease).  Viral infection of the inner ear.  Head injury. What increases the risk? You are more likely to develop this condition if:  You are older than age 40.  You have a family history of Meniere disease.  You have a history of autoimmune disease.  You have a history of migraine headaches. What are the signs or symptoms? Symptoms of this condition can come and go and may last for up to 4 hours at a time. Symptoms usually start in one ear. They may become more frequent and eventually involve both ears. Symptoms can include:  Fullness and pressure in your ear.  Roaring or ringing in your ear.  Vertigo and loss of balance.  Dizziness.  Decreased hearing.  Nausea and vomiting. How is this diagnosed? This condition is diagnosed based on:  A physical exam.  Tests , such as: ? A hearing test (audiogram). ? An electronystagmogram. This tests your balance nerve (vestibular nerve). ? Imaging studies of your inner ear, such as CT scan or MRI. ? Other balance tests, such as rotational or balance platform tests. How is this treated? There is  no cure for this condition, but treatment can help to manage your symptoms. Treatment may include:  A low-salt diet. Limiting salt may help to reduce fluid in the body and relieve symptoms.  Oral or injected medicines to reduce or control: ? Vertigo. ? Nausea. ? Fluid retention. ? Dizziness.  Use of an air pressure pulse generator. This is a machine that sends small pressure pulses into your ear canal.  Hearing aids.  Inner ear surgery. This is rare. When you have symptoms, it can be helpful to lie down on a flat surface and focus your eyes on one object that does not move. Try to stay in that position until your symptoms go away. Follow these instructions at home: Eating and drinking  Eat the same amount of food at the same time every day, including snacks.  Do not skip meals.  Avoid caffeine.  Drink enough fluids to keep your urine clear or pale yellow.  Limit alcoholic drinks to one drink a day for non-pregnant women and 2 drinks a day for men. One drink equals 12 oz of beer, 5 oz of wine, or 1 oz of hard liquor.  Limit the salt (sodium) in your diet as told by your health care provider. Check ingredients and nutrition facts on packaged foods and beverages.  Do not eat foods that contain monosodium glutamate (MSG). General instructions  Do not use any products that contain nicotine or tobacco, such as cigarettes and e-cigarettes. If you need help quitting, ask your   health care provider.  Take over-the-counter and prescription medicines only as told by your health care provider.  Find ways to reduce or avoid stress. If you need help with this, ask your health care provider.  Do not drive if you have vertigo or dizziness. Contact a health care provider if:  You have symptoms that last longer than 4 hours.  You have new or worse symptoms. Get help right away if:  You have been vomiting for 24 hours.  You cannot keep fluids down.  You have chest pain or trouble  breathing. Summary  Meniere disease is an inner ear disorder. It causes attacks of a spinning sensation (vertigo), dizziness, and ringing in the ear (tinnitus). It also causes hearing loss and a feeling of fullness or pressure in the ear.  Symptoms of this condition can come and go and may last for up to 4 hours at a time.  When you have symptoms, it can be helpful to lie down on a flat surface and focus your eyes on one object that does not move. Try to stay in that position until your symptoms go away. This information is not intended to replace advice given to you by your health care provider. Make sure you discuss any questions you have with your health care provider. Document Revised: 09/13/2017 Document Reviewed: 08/22/2016 Elsevier Patient Education  2020 [employer] 

## 2020-07-25 NOTE — Progress Notes (Signed)
Subjective:  Patient ID: Jon Gonzales, male    DOB: 1961/05/14  Age: 59 y.o. MRN: [id number]  Chief Complaint  Patient presents with  . Dizziness    HPI: Patient woke up last night and felt bed move.  He has been unstable walking and having some dizziness.  He noted decreased hearing and ringing in ears.  He continue to be dizzy today.  Hearing loss 25 db in right, 40 db in left Current Outpatient Medications on File Prior to Visit  Medication Sig Dispense Refill  . acetaminophen (TYLENOL) 500 MG tablet Take 1,000 mg by mouth every 6 (six) hours as needed (for pain.).    Marland Kitchen aspirin EC 81 MG tablet Take 1 tablet (81 mg total) by mouth daily. 30 tablet 0  . cloNIDine (CATAPRES) 0.1 MG tablet Take 1 tablet (0.1 mg total) by mouth 2 (two) times daily. 60 tablet 2  . clopidogrel (PLAVIX) 75 MG tablet Take 1 tablet (75 mg total) by mouth daily. 90 tablet 2  . diltiazem (CARDIZEM SR) 60 MG 12 hr capsule Take 1 capsule (60 mg total) by mouth 2 (two) times daily. 180 capsule 2  . DULoxetine (CYMBALTA) 60 MG capsule TAKE 1 CAPSULE BY MOUTH TWICE DAILY 180 capsule 2  . EPINEPHrine (EPIPEN 2-PAK) 0.3 mg/0.3 mL IJ SOAJ injection Inject 0.3 mLs (0.3 mg total) into the muscle as needed for anaphylaxis. 1 each 3  . Eszopiclone (ESZOPICLONE) 3 MG TABS Take 1 tablet (3 mg total) by mouth at bedtime as needed (sleep). Take immediately before bedtime 90 tablet 2  . fexofenadine (ALLEGRA) 60 MG tablet Take 60 mg by mouth daily.    Marland Kitchen lithium carbonate (LITHOBID) 300 MG CR tablet Take 1 tablet (300 mg total) by mouth 2 (two) times daily. 180 tablet 2  . LORazepam (ATIVAN) 1 MG tablet Take 1 tablet (1 mg total) by mouth in the morning, at noon, and at bedtime. 180 tablet 2  . losartan-hydrochlorothiazide (HYZAAR) 100-25 MG tablet Take 1 tablet by mouth daily.    . metoprolol succinate (TOPROL-XL) 100 MG 24 hr tablet Take 100 mg by mouth daily.    . Multiple Vitamin (MULTIVITAMIN WITH MINERALS) TABS tablet Take  1 tablet by mouth daily. Men's Multivitamin    . nitroGLYCERIN (NITROSTAT) 0.4 MG SL tablet Place 0.4 mg under the tongue every 5 (five) minutes x 3 doses as needed for chest pain.     . ranolazine (RANEXA) 1000 MG SR tablet Take 1 tablet (1,000 mg total) by mouth 2 (two) times daily. 180 tablet 3  . rosuvastatin (CRESTOR) 20 MG tablet Take 1 tablet (20 mg total) by mouth at bedtime. 90 tablet 2  . Saw Palmetto 450 MG CAPS Take 450 mg by mouth 2 (two) times daily.     Current Facility-Administered Medications on File Prior to Visit  Medication Dose Route Frequency Provider Last Rate Last Admin  . EPINEPHrine (EPI-PEN) injection 0.3 mg  0.3 mg Intramuscular Once [name], MD      . EPINEPHrine (EPI-PEN) injection 0.3 mg  0.3 mg Intramuscular Once [name], MD       Past Medical History:  Diagnosis Date  . Angina pectoris (HCC) 10/26/2019  . Anxiety, generalized 05/22/2016  . Bilateral leg edema 01/29/2020  . CAD (coronary artery disease)    Cath 2013 LAD 95% stenosis, D1 90% stenosis, RCA 20%, Circ 30%.  Previous stent to D1 2012.   EF 60%.    . Class  2 obesity due to excess calories without serious comorbidity in adult 09/11/2016  . Confusional arousals 09/11/2016  . Depression   . Excessive daytime sleepiness 09/11/2016  . Herpes zoster 01/20/2020  . HTN (hypertension)   . Hypercholesterolemia 05/22/2016  . Hyperlipidemia   . Hypertension 05/22/2016  . Hypogonadism in male 05/22/2016  . Low testosterone   . Nephrolithiasis   . OSA (obstructive sleep apnea) 09/11/2016  . OSA on CPAP 01/03/2017  . 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 Paranoid reaction, acute (HCC) 05/22/2016  . PLMD (periodic limb movement disorder) 09/11/2016  . PUD (peptic ulcer disease)   . Screening for prostate cancer 05/22/2016  . Severe episode of recurrent major depressive disorder, without psychotic features (HCC) 09/11/2016  . Snoring 09/11/2016  . TIA (transient  ischemic attack)    Past Surgical History:  Procedure Laterality Date  . CHOLECYSTECTOMY    . CORONARY ARTERY BYPASS GRAFT  02/19/12   L - LAD, SVG - D1, SVG - OM  . CORONARY STENT INTERVENTION N/A 11/05/2019   Procedure: CORONARY STENT INTERVENTION;  Surgeon: Runell GessBerry, Jonathan J, MD;  Location: [facility];  Service: Cardiovascular;  Laterality: N/A;  SVG to DIAG  . LEFT HEART CATH AND CORS/GRAFTS ANGIOGRAPHY N/A 11/05/2019   Procedure: LEFT HEART CATH AND CORS/GRAFTS ANGIOGRAPHY;  Surgeon: Runell GessBerry, Jonathan J, MD;  Location: [facility];  Service: Cardiovascular;  Laterality: N/A;  . RENAL ANGIOGRAPHY N/A 11/05/2019   Procedure: RENAL ANGIOGRAPHY;  Surgeon: Runell GessBerry, Jonathan J, MD;  Location: [facility];  Service: Cardiovascular;  Laterality: N/A;  . TONSILLECTOMY AND ADENOIDECTOMY    . WRIST SURGERY      Family History  Problem Relation Age of Onset  . CAD Father 6850  . Aneurysm Mother [id number]       cerebral  . Heart disease Sister        Unclear  . Healthy Brother    Social History   Socioeconomic History  . Marital status: Married    Spouse name: Not on file  . Number of children: Not on file  . Years of education: Not on file  . Highest education level: Bachelor's degree (e.g., BA, AB, BS)  Occupational History  . Not on file  Tobacco Use  . Smoking status: Never Smoker  . Smokeless tobacco: Never Used  Vaping Use  . Vaping Use: Never used  Substance and Sexual Activity  . Alcohol use: Yes    Alcohol/week: 6.0 standard drinks    Types: 6 Cans of beer per week  . Drug use: Not on file  . Sexual activity: Not on file  Other Topics Concern  . Not on file  Social History Narrative   He runs the practice for [place]     Social Determinants of Health   Financial Resource Strain:   . Difficulty of Paying Living Expenses: Not on file  Food Insecurity:   . Worried About Programme researcher, broadcasting/film/videounning Out of Food in the Last Year: Not on  file  . Ran Out of Food in the Last Year: Not on file  Transportation Needs:   . Lack of Transportation (Medical): Not on file  . Lack of Transportation (Non-Medical): Not on file  Physical Activity:   . Days of Exercise per Week: Not on file  . Minutes of Exercise per Session: Not on file  Stress:   . Feeling of Stress : Not on file  Social Connections:   . Frequency of Communication with Friends and Family: Not on file  . Frequency of Social Gatherings with Friends and Family: Not on file  . Attends Religious Services: Not on file  . Active Member of Clubs or Organizations: Not on file  . Attends Banker Meetings: Not on file  . Marital Status: Not on file    Review of Systems  Constitutional: Negative.   HENT: Negative.   Eyes: Negative.   Respiratory: Positive for shortness of breath. Negative for cough.   Cardiovascular: Negative for chest pain, palpitations and leg swelling.  Gastrointestinal: Negative.  Negative for constipation.  Genitourinary: Negative.   Musculoskeletal: Negative.   Skin: Negative.   Neurological: Positive for dizziness.       Transient decreased hearing last night  Psychiatric/Behavioral: Negative.      Objective:  BP 110/68   Pulse 68   Temp (!) 97.5 F (36.4 C)   Ht 5\' 10"  (1.778 m)   Wt 225 lb (102.1 kg)   SpO2 98%   BMI 32.28 kg/m   BP/Weight 07/25/2020 07/05/2020 03/01/2020  Systolic BP 110 133 110  Diastolic BP 68 86 82  Wt. (Lbs) 225 221.8 215  BMI 32.28 31.82 30.85    Physical Exam Vitals reviewed.  Constitutional:      Appearance: Normal appearance.  HENT:     Right Ear: Tympanic membrane, ear canal and external ear normal.     Left Ear: Tympanic membrane, ear canal and external ear normal.     Mouth/Throat:     Mouth: Mucous membranes are moist.     Pharynx: Oropharynx is clear.  Eyes:     Extraocular Movements: Extraocular movements intact.     Conjunctiva/sclera: Conjunctivae normal.     Pupils:  Pupils are equal, round, and reactive to light.     Comments: Positive nystagmus  Cardiovascular:     Pulses: Normal pulses.     Heart sounds: Normal heart sounds.  Pulmonary:     Effort: Pulmonary effort is normal.     Breath sounds: Normal breath sounds.  Abdominal:     General: Abdomen is flat.  Musculoskeletal:     Cervical back: Normal range of motion and neck supple.  Skin:    Capillary Refill: Capillary refill takes less than 2 seconds.  Neurological:     Mental Status: He is alert.     Gait: Gait abnormal.     Comments: Slow finger to nose, positive rhomberg    PFTs normal.   Lab Results  Component Value Date   WBC 6.1 06/07/2020   HGB 14.0 06/07/2020   HCT 41.1 06/07/2020   PLT 172 06/07/2020   GLUCOSE 104 (H) 06/07/2020   CHOL 145 06/07/2020   TRIG 119 06/07/2020   HDL 55 06/07/2020   LDLCALC 69 06/07/2020   ALT 16 06/07/2020   AST 19 06/07/2020   NA 142 06/07/2020   K 4.0 06/07/2020   CL 103 06/07/2020   CREATININE 1.24 06/07/2020   BUN 17 06/07/2020   CO2 24 06/07/2020      Assessment & Plan:   1. Dizziness - Lithium level Patient is having dizziness and this may be due to lithium toxicity  2. Meniere's disease of both ears - predniSONE (DELTASONE) 20 MG tablet; Take 1 tablet (20 mg total) by mouth 3 (three) times daily.  Dispense: 30 tablet; Refill: 0 Patient has signs and symptoms consistent with meniere's disease.  Start 60mg  prednisone for 10  days, may need MRI head.  3. PUD (peptic ulcer disease) - lansoprazole (PREVACID) 30 MG capsule; Take 1 capsule (30 mg total) by mouth daily at 12 noon.  Dispense: 90 capsule; Refill: 2 Pepcid not working, start prevacid for stomach protection    Meds ordered this encounter  Medications  . DISCONTD: predniSONE (DELTASONE) 20 MG tablet    Sig: Take 1 tablet (20 mg total) by mouth 3 (three) times daily.    Dispense:  30 tablet    Refill:  0  . lansoprazole (PREVACID) 30 MG capsule    Sig: Take 1  capsule (30 mg total) by mouth daily at 12 noon.    Dispense:  90 capsule    Refill:  2  . predniSONE (DELTASONE) 20 MG tablet    Sig: Take 1 tablet (20 mg total) by mouth 3 (three) times daily.    Dispense:  30 tablet    Refill:  0    Orders Placed This Encounter  Procedures  . Lithium level     Follow-up: Return in about 1 week (around 08/01/2020).  An After Visit Summary was printed and given to the patient.  [facility]rent [facility] [phone]

## 2020-07-26 LAB — LITHIUM LEVEL: Lithium Lvl: 1 mmol/L (ref 0.5–1.2)

## 2020-08-01 NOTE — Telephone Encounter (Signed)
Cancel. Kc

## 2020-08-04 ENCOUNTER — Other Ambulatory Visit: Payer: Self-pay | Admitting: Legal Medicine

## 2020-08-04 DIAGNOSIS — H8103 Meniere's disease, bilateral: Secondary | ICD-10-CM

## 2020-08-04 MED ORDER — HYDROCHLOROTHIAZIDE 25 MG PO TABS: 25.0000 mg | ORAL_TABLET | Freq: Every day | ORAL | 3 refills | Status: DC

## 2020-08-04 MED ORDER — PREDNISONE 20 MG PO TABS: 20.0000 mg | ORAL_TABLET | Freq: Three times a day (TID) | ORAL | 0 refills | Status: DC

## 2020-08-08 ENCOUNTER — Other Ambulatory Visit: Payer: Self-pay | Admitting: Legal Medicine

## 2020-08-08 DIAGNOSIS — I10 Essential (primary) hypertension: Secondary | ICD-10-CM

## 2020-08-08 DIAGNOSIS — F5101 Primary insomnia: Secondary | ICD-10-CM

## 2020-08-08 DIAGNOSIS — H8103 Meniere's disease, bilateral: Secondary | ICD-10-CM

## 2020-08-08 MED ORDER — TRIAMTERENE-HCTZ 37.5-25 MG PO CAPS: 1.0000 | ORAL_CAPSULE | Freq: Every day | ORAL | 2 refills | Status: DC

## 2020-08-08 MED ORDER — LOSARTAN POTASSIUM 100 MG PO TABS: 100.0000 mg | ORAL_TABLET | Freq: Every day | ORAL | 3 refills | Status: DC

## 2020-08-08 MED ORDER — ESZOPICLONE 3 MG PO TABS: 3.0000 mg | ORAL_TABLET | Freq: Every day | ORAL | 2 refills | Status: DC

## 2020-08-08 MED FILL — LOSARTAN POTASSIUM 100 MG T: 100 | 90 days supply | Qty: 90 | Fill #0

## 2020-08-08 MED FILL — ESZOPICLONE 3 MG TABS: 3 | 90 days supply | Qty: 90 | Fill #0

## 2020-08-08 MED FILL — TRIAMTERENE/HCTZ 37.5/25 CP: 37.5-25 | 90 days supply | Qty: 90 | Fill #0

## 2020-08-13 MED FILL — LORazepam 1 MG TABS: 1 | 60 days supply | Qty: 180 | Fill #0

## 2020-08-22 ENCOUNTER — Other Ambulatory Visit: Payer: Self-pay | Admitting: Legal Medicine

## 2020-08-22 ENCOUNTER — Encounter: Payer: Self-pay | Admitting: Legal Medicine

## 2020-08-22 ENCOUNTER — Other Ambulatory Visit: Payer: Self-pay

## 2020-08-22 ENCOUNTER — Ambulatory Visit (INDEPENDENT_AMBULATORY_CARE_PROVIDER_SITE_OTHER): Payer: No Typology Code available for payment source | Admitting: Legal Medicine

## 2020-08-22 VITALS — BP 120/80 | HR 62 | Temp 98.6°F | Resp 16

## 2020-08-22 DIAGNOSIS — L03012 Cellulitis of left finger: Secondary | ICD-10-CM

## 2020-08-22 DIAGNOSIS — H8103 Meniere's disease, bilateral: Secondary | ICD-10-CM

## 2020-08-22 HISTORY — DX: Cellulitis of left finger: L03.012

## 2020-08-22 MED ORDER — CEPHALEXIN 500 MG PO CAPS: 500.0000 mg | ORAL_CAPSULE | Freq: Four times a day (QID) | ORAL | 0 refills | Status: DC

## 2020-08-22 NOTE — Patient Instructions (Signed)
Wound Care, Adult Taking care of your wound properly can help to prevent pain, infection, and scarring. It can also help your wound to heal more quickly. How to care for your wound Wound care      Follow instructions from your health care provider about how to take care of your wound. Make sure you: ? Wash your hands with soap and water before you change the bandage (dressing). If soap and water are not available, use hand sanitizer. ? Change your dressing as told by your health care provider. ? Leave stitches (sutures), skin glue, or adhesive strips in place. These skin closures may need to stay in place for 2 weeks or longer. If adhesive strip edges start to loosen and curl up, you may trim the loose edges. Do not remove adhesive strips completely unless your health care provider tells you to do that.  Check your wound area every day for signs of infection. Check for: ? Redness, swelling, or pain. ? Fluid or blood. ? Warmth. ? Pus or a bad smell.  Ask your health care provider if you should clean the wound with mild soap and water. Doing this may include: ? Using a clean towel to pat the wound dry after cleaning it. Do not rub or scrub the wound. ? Applying a cream or ointment. Do this only as told by your health care provider. ? Covering the incision with a clean dressing.  Ask your health care provider when you can leave the wound uncovered.  Keep the dressing dry until your health care provider says it can be removed. Do not take baths, swim, use a hot tub, or do anything that would put the wound underwater until your health care provider approves. Ask your health care provider if you can take showers. You may only be allowed to take sponge baths. Medicines   If you were prescribed an antibiotic medicine, cream, or ointment, take or use the antibiotic as told by your health care provider. Do not stop taking or using the antibiotic even if your condition improves.  Take  over-the-counter and prescription medicines only as told by your health care provider. If you were prescribed pain medicine, take it 30 or more minutes before you do any wound care or as told by your health care provider. General instructions  Return to your normal activities as told by your health care provider. Ask your health care provider what activities are safe.  Do not scratch or pick at the wound.  Do not use any products that contain nicotine or tobacco, such as cigarettes and e-cigarettes. These may delay wound healing. If you need help quitting, ask your health care provider.  Keep all follow-up visits as told by your health care provider. This is important.  Eat a diet that includes protein, vitamin A, vitamin C, and other nutrient-rich foods to help the wound heal. ? Foods rich in protein include meat, dairy, beans, nuts, and other sources. ? Foods rich in vitamin A include carrots and dark green, leafy vegetables. ? Foods rich in vitamin C include citrus, tomatoes, and other fruits and vegetables. ? Nutrient-rich foods have protein, carbohydrates, fat, vitamins, or minerals. Eat a variety of healthy foods including vegetables, fruits, and whole grains. Contact a health care provider if:  You received a tetanus shot and you have swelling, severe pain, redness, or bleeding at the injection site.  Your pain is not controlled with medicine.  You have redness, swelling, or pain around the wound.    You have fluid or blood coming from the wound.  Your wound feels warm to the touch.  You have pus or a bad smell coming from the wound.  You have a fever or chills.  You are nauseous or you vomit.  You are dizzy. Get help right away if:  You have a red streak going away from your wound.  The edges of the wound open up and separate.  Your wound is bleeding, and the bleeding does not stop with gentle pressure.  You have a rash.  You faint.  You have trouble  breathing. Summary  Always wash your hands with soap and water before changing your bandage (dressing).  To help with healing, eat foods that are rich in protein, vitamin A, vitamin C, and other nutrients.  Check your wound every day for signs of infection. Contact your health care provider if you suspect that your wound is infected. This information is not intended to replace advice given to you by your health care provider. Make sure you discuss any questions you have with your health care provider. Document Revised: 01/19/2019 Document Reviewed: 04/17/2016 Elsevier Patient Education  2020 [employer]  

## 2020-08-22 NOTE — Progress Notes (Signed)
Subjective:  Patient ID: Jon Gonzales, male    DOB: 02/10/1961  Age: 59 y.o. MRN: [id number]  Chief Complaint  Patient presents with  . Hand Pain    HPI: this is a 59 yo male with infection of left 5th finger for 3 days.  It is painful and purulent. He is soaking and on zithromax.  He requires I & D   Current Outpatient Medications on File Prior to Visit  Medication Sig Dispense Refill  . acetaminophen (TYLENOL) 500 MG tablet Take 1,000 mg by mouth every 6 (six) hours as needed (for pain.).    Marland Kitchen aspirin EC 81 MG tablet Take 1 tablet (81 mg total) by mouth daily. 30 tablet 0  . cloNIDine (CATAPRES) 0.1 MG tablet Take 1 tablet (0.1 mg total) by mouth 2 (two) times daily. 60 tablet 2  . clopidogrel (PLAVIX) 75 MG tablet Take 1 tablet (75 mg total) by mouth daily. 90 tablet 2  . diltiazem (CARDIZEM SR) 60 MG 12 hr capsule Take 1 capsule (60 mg total) by mouth 2 (two) times daily. 180 capsule 2  . DULoxetine (CYMBALTA) 60 MG capsule TAKE 1 CAPSULE BY MOUTH TWICE DAILY 180 capsule 2  . EPINEPHrine (EPIPEN 2-PAK) 0.3 mg/0.3 mL IJ SOAJ injection Inject 0.3 mLs (0.3 mg total) into the muscle as needed for anaphylaxis. 1 each 3  . Eszopiclone 3 MG TABS Take 1 tablet (3 mg total) by mouth at bedtime. Take immediately before bedtime 90 tablet 2  . fexofenadine (ALLEGRA) 60 MG tablet Take 60 mg by mouth daily.    . hydrochlorothiazide (HYDRODIURIL) 25 MG tablet Take 1 tablet (25 mg total) by mouth daily. 30 tablet 3  . lansoprazole (PREVACID) 30 MG capsule Take 1 capsule (30 mg total) by mouth daily at 12 noon. 90 capsule 2  . lithium carbonate (LITHOBID) 300 MG CR tablet Take 1 tablet (300 mg total) by mouth 2 (two) times daily. 180 tablet 2  . LORazepam (ATIVAN) 1 MG tablet Take 1 tablet (1 mg total) by mouth in the morning, at noon, and at bedtime. 180 tablet 2  . losartan (COZAAR) 100 MG tablet Take 1 tablet (100 mg total) by mouth daily. 90 tablet 3  . metoprolol succinate (TOPROL-XL) 100 MG 24  hr tablet Take 100 mg by mouth daily.    . Multiple Vitamin (MULTIVITAMIN WITH MINERALS) TABS tablet Take 1 tablet by mouth daily. Men's Multivitamin    . nitroGLYCERIN (NITROSTAT) 0.4 MG SL tablet Place 0.4 mg under the tongue every 5 (five) minutes x 3 doses as needed for chest pain.     . predniSONE (DELTASONE) 20 MG tablet Take 1 tablet (20 mg total) by mouth 3 (three) times daily. 30 tablet 0  . ranolazine (RANEXA) 1000 MG SR tablet Take 1 tablet (1,000 mg total) by mouth 2 (two) times daily. 180 tablet 3  . rosuvastatin (CRESTOR) 20 MG tablet Take 1 tablet (20 mg total) by mouth at bedtime. 90 tablet 2  . Saw Palmetto 450 MG CAPS Take 450 mg by mouth 2 (two) times daily.    Marland Kitchen triamterene-hydrochlorothiazide (DYAZIDE) 37.5-25 MG capsule Take 1 each (1 capsule total) by mouth daily. 90 capsule 2   Current Facility-Administered Medications on File Prior to Visit  Medication Dose Route Frequency Provider Last Rate Last Admin  . EPINEPHrine (EPI-PEN) injection 0.3 mg  0.3 mg Intramuscular Once [name], MD      . EPINEPHrine (EPI-PEN) injection 0.3 mg  0.3  mg Intramuscular Once [name], MD       Past Medical History:  Diagnosis Date  . Angina pectoris (HCC) 10/26/2019  . Anxiety, generalized 05/22/2016  . Bilateral leg edema 01/29/2020  . CAD (coronary artery disease)    Cath 2013 LAD 95% stenosis, D1 90% stenosis, RCA 20%, Circ 30%.  Previous stent to D1 2012.   EF 60%.    . Class 2 obesity due to excess calories without serious comorbidity in adult 09/11/2016  . Confusional arousals 09/11/2016  . Depression   . Excessive daytime sleepiness 09/11/2016  . Herpes zoster 01/20/2020  . HTN (hypertension)   . Hypercholesterolemia 05/22/2016  . Hyperlipidemia   . Hypertension 05/22/2016  . Hypogonadism in male 05/22/2016  . Low testosterone   . Nephrolithiasis   . OSA (obstructive sleep apnea) 09/11/2016  . OSA on CPAP 01/03/2017  . 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 Paranoid reaction, acute (HCC) 05/22/2016  . PLMD (periodic limb movement disorder) 09/11/2016  . PUD (peptic ulcer disease)   . Screening for prostate cancer 05/22/2016  . Severe episode of recurrent major depressive disorder, without psychotic features (HCC) 09/11/2016  . Snoring 09/11/2016  . TIA (transient ischemic attack)    Past Surgical History:  Procedure Laterality Date  . CHOLECYSTECTOMY    . CORONARY ARTERY BYPASS GRAFT  02/19/12   L - LAD, SVG - D1, SVG - OM  . CORONARY STENT INTERVENTION N/A 11/05/2019   Procedure: CORONARY STENT INTERVENTION;  Surgeon: Runell Gess, MD;  Location: [facility];  Service: Cardiovascular;  Laterality: N/A;  SVG to DIAG  . LEFT HEART CATH AND CORS/GRAFTS ANGIOGRAPHY N/A 11/05/2019   Procedure: LEFT HEART CATH AND CORS/GRAFTS ANGIOGRAPHY;  Surgeon: Runell Gess, MD;  Location: [facility];  Service: Cardiovascular;  Laterality: N/A;  . RENAL ANGIOGRAPHY N/A 11/05/2019   Procedure: RENAL ANGIOGRAPHY;  Surgeon: Runell Gess, MD;  Location: [facility];  Service: Cardiovascular;  Laterality: N/A;  . TONSILLECTOMY AND ADENOIDECTOMY    . WRIST SURGERY      Family History  Problem Relation Age of Onset  . CAD Father 44  . Aneurysm Mother 55       cerebral  . Heart disease Sister        Unclear  . Healthy Brother    Social History   Socioeconomic History  . Marital status: Married    Spouse name: Not on file  . Number of children: Not on file  . Years of education: Not on file  . Highest education level: Bachelor's degree (e.g., BA, AB, BS)  Occupational History  . Not on file  Tobacco Use  . Smoking status: Never Smoker  . Smokeless tobacco: Never Used  Vaping Use  . Vaping Use: Never used  Substance and Sexual Activity  . Alcohol use: Yes    Alcohol/week: 6.0 standard drinks    Types: 6 Cans of beer per week  . Drug use: Not on file  . Sexual activity: Not on file    Other Topics Concern  . 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     Social Determinants of Health   Financial Resource Strain:   . Difficulty of Paying Living Expenses: Not on file  Food Insecurity:   . Worried About Programme researcher, broadcasting/film/video in the Last Year: Not  on file  . Ran Out of Food in the Last Year: Not on file  Transportation Needs:   . Lack of Transportation (Medical): Not on file  . Lack of Transportation (Non-Medical): Not on file  Physical Activity:   . Days of Exercise per Week: Not on file  . Minutes of Exercise per Session: Not on file  Stress:   . Feeling of Stress : Not on file  Social Connections:   . Frequency of Communication with Friends and Family: Not on file  . Frequency of Social Gatherings with Friends and Family: Not on file  . Attends Religious Services: Not on file  . Active Member of Clubs or Organizations: Not on file  . Attends BankerClub or Organization Meetings: Not on file  . Marital Status: Not on file    Review of Systems  Constitutional: Negative.   HENT: Negative.   Eyes: Negative.   Genitourinary: Negative.   Musculoskeletal: Negative.   Skin:       Abscess left 5th finger medially  Neurological: Negative.      Objective:  There were no vitals taken for this visit.  BP/Weight 07/25/2020 07/05/2020 03/01/2020  Systolic BP 110 133 110  Diastolic BP 68 86 82  Wt. (Lbs) 225 221.8 215  BMI 32.28 31.82 30.85    Physical Exam Constitutional:      Appearance: He is normal weight.  HENT:     Right Ear: Tympanic membrane normal.     Left Ear: Tympanic membrane normal.  Cardiovascular:     Rate and Rhythm: Normal rate.     Pulses: Normal pulses.  Pulmonary:     Effort: Pulmonary effort is normal.     Breath sounds: Normal breath sounds.  Abdominal:     Palpations: Abdomen is soft.  Musculoskeletal:     Comments: Abscess left 5th finger around paronychia   Skin:    Comments: Skin red and swollen paronychia left 5th finger      Lab Results  Component Value Date   WBC 6.1 06/07/2020   HGB 14.0 06/07/2020   HCT 41.1 06/07/2020   PLT 172 06/07/2020   GLUCOSE 104 (H) 06/07/2020   CHOL 145 06/07/2020   TRIG 119 06/07/2020   HDL 55 06/07/2020   LDLCALC 69 06/07/2020   ALT 16 06/07/2020   AST 19 06/07/2020   NA 142 06/07/2020   K 4.0 06/07/2020   CL 103 06/07/2020   CREATININE 1.24 06/07/2020   BUN 17 06/07/2020   CO2 24 06/07/2020      Assessment & Plan:   1. Paronychia of finger, left - cephALEXin (KEFLEX) 500 MG capsule; Take 1 capsule (500 mg total) by mouth 4 (four) times daily.  Dispense: 20 capsule; Refill: 0 - Anaerobic and Aerobic Culture The abscess was I & D'd and now doing well.   Procedure: the left fifth finger was prepped in usual manner.  Informed consent obtained.  1% xylocaine 3cc used for finger block.  The abscess was I & D with surgical scalpel and purulence expressed and cultured.  The finger was cleaned and dress .  Minimal blood loss and no complications.  He is to soak finger at least bid. Meds ordered this encounter  Medications  . cephALEXin (KEFLEX) 500 MG capsule    Sig: Take 1 capsule (500 mg total) by mouth 4 (four) times daily.    Dispense:  20 capsule    Refill:  0    Orders Placed This Encounter  Procedures  .  Anaerobic and Aerobic Culture     Follow-up: Return in about 1 week (around 08/29/2020) for finger.  An After Visit Summary was printed and given to the patient.  Brent Bulla [facility] [phone]

## 2020-08-23 ENCOUNTER — Encounter: Payer: Self-pay | Admitting: Legal Medicine

## 2020-08-23 ENCOUNTER — Ambulatory Visit (INDEPENDENT_AMBULATORY_CARE_PROVIDER_SITE_OTHER): Payer: No Typology Code available for payment source | Admitting: Legal Medicine

## 2020-08-23 ENCOUNTER — Other Ambulatory Visit: Payer: Self-pay | Admitting: Legal Medicine

## 2020-08-23 VITALS — BP 130/84 | HR 95 | Temp 98.1°F | Ht 70.0 in | Wt 218.0 lb

## 2020-08-23 DIAGNOSIS — R3911 Hesitancy of micturition: Secondary | ICD-10-CM | POA: Diagnosis not present

## 2020-08-23 DIAGNOSIS — N401 Enlarged prostate with lower urinary tract symptoms: Secondary | ICD-10-CM

## 2020-08-23 DIAGNOSIS — K921 Melena: Secondary | ICD-10-CM

## 2020-08-23 LAB — HEMOCCULT GUIAC POC 1CARD (OFFICE)
Card #1 Date: 11092021
Fecal Occult Blood, POC: POSITIVE — AB

## 2020-08-23 LAB — POCT URINALYSIS DIP (CLINITEK)
Bilirubin, UA: NEGATIVE
Blood, UA: NEGATIVE
Glucose, UA: NEGATIVE mg/dL
Ketones, POC UA: NEGATIVE mg/dL
Leukocytes, UA: NEGATIVE
Nitrite, UA: NEGATIVE
Spec Grav, UA: 1.015 (ref 1.010–1.025)
Urobilinogen, UA: 0.2 E.U./dL
pH, UA: 6.5 (ref 5.0–8.0)

## 2020-08-23 MED ORDER — PROCTOFOAM HC 1-1 % EX FOAM: 1.0000 | Freq: Two times a day (BID) | CUTANEOUS | 2 refills | Status: DC

## 2020-08-23 MED ORDER — TAMSULOSIN HCL 0.4 MG PO CAPS: 0.4000 mg | ORAL_CAPSULE | Freq: Every day | ORAL | 3 refills | Status: DC

## 2020-08-23 MED FILL — PROCTOFOAM-HC 1%-1% FOAM: 1-1 | 5 days supply | Qty: 10 | Fill #0

## 2020-08-23 NOTE — Progress Notes (Signed)
Subjective:  Patient ID: Jon Gonzales, male    DOB: Jun 25, 1961  Age: 59 y.o. MRN: [id number]  Chief Complaint  Patient presents with  . Hematochezia    Since 2 weeks ago, patient noticed blood on the toilet after bowel moviment.    HPI: started BRBPR for 3 weeks, Jon Gonzales is straining.  BM q2days. Sometime hard.  Blood is on paper and toilet.  No pain. His last colonoscopy was Ok.  It was 7 years ago.  Jon Gonzales is having problems with hesitancy. stops fine but poor emptying.  With frequency 3 months. Jon Gonzales saw dr. Saddie Benderschao 3 years ago. No problems then.  Jon Gonzales is having significant obstruction problems and we will start flomax and watch BP. Current Outpatient Medications on File Prior to Visit  Medication Sig Dispense Refill  . acetaminophen (TYLENOL) 500 MG tablet Take 1,000 mg by mouth every 6 (six) hours as needed (for pain.).    Marland Kitchen. aspirin EC 81 MG tablet Take 1 tablet (81 mg total) by mouth daily. 30 tablet 0  . cephALEXin (KEFLEX) 500 MG capsule Take 1 capsule (500 mg total) by mouth 4 (four) times daily. 20 capsule 0  . cloNIDine (CATAPRES) 0.1 MG tablet Take 1 tablet (0.1 mg total) by mouth 2 (two) times daily. 60 tablet 2  . clopidogrel (PLAVIX) 75 MG tablet Take 1 tablet (75 mg total) by mouth daily. 90 tablet 2  . diltiazem (CARDIZEM SR) 60 MG 12 hr capsule Take 1 capsule (60 mg total) by mouth 2 (two) times daily. 180 capsule 2  . DULoxetine (CYMBALTA) 60 MG capsule TAKE 1 CAPSULE BY MOUTH TWICE DAILY 180 capsule 2  . EPINEPHrine (EPIPEN 2-PAK) 0.3 mg/0.3 mL IJ SOAJ injection Inject 0.3 mLs (0.3 mg total) into the muscle as needed for anaphylaxis. 1 each 3  . Eszopiclone 3 MG TABS Take 1 tablet (3 mg total) by mouth at bedtime. Take immediately before bedtime 90 tablet 2  . fexofenadine (ALLEGRA) 60 MG tablet Take 60 mg by mouth daily.    . hydrochlorothiazide (HYDRODIURIL) 25 MG tablet Take 1 tablet (25 mg total) by mouth daily. 30 tablet 3  . lansoprazole (PREVACID) 30 MG capsule Take 1 capsule  (30 mg total) by mouth daily at 12 noon. 90 capsule 2  . lithium carbonate (LITHOBID) 300 MG CR tablet Take 1 tablet (300 mg total) by mouth 2 (two) times daily. 180 tablet 2  . LORazepam (ATIVAN) 1 MG tablet Take 1 tablet (1 mg total) by mouth in the morning, at noon, and at bedtime. 180 tablet 2  . losartan (COZAAR) 100 MG tablet Take 1 tablet (100 mg total) by mouth daily. 90 tablet 3  . metoprolol succinate (TOPROL-XL) 100 MG 24 hr tablet Take 100 mg by mouth daily.    . Multiple Vitamin (MULTIVITAMIN WITH MINERALS) TABS tablet Take 1 tablet by mouth daily. Men's Multivitamin    . nitroGLYCERIN (NITROSTAT) 0.4 MG SL tablet Place 0.4 mg under the tongue every 5 (five) minutes x 3 doses as needed for chest pain.     . predniSONE (DELTASONE) 20 MG tablet Take 1 tablet (20 mg total) by mouth 3 (three) times daily. 30 tablet 0  . ranolazine (RANEXA) 1000 MG SR tablet Take 1 tablet (1,000 mg total) by mouth 2 (two) times daily. 180 tablet 3  . rosuvastatin (CRESTOR) 20 MG tablet Take 1 tablet (20 mg total) by mouth at bedtime. 90 tablet 2  . Saw Palmetto 450 MG CAPS Take  450 mg by mouth 2 (two) times daily.    Marland Kitchen triamterene-hydrochlorothiazide (DYAZIDE) 37.5-25 MG capsule Take 1 each (1 capsule total) by mouth daily. 90 capsule 2   Current Facility-Administered Medications on File Prior to Visit  Medication Dose Route Frequency Provider Last Rate Last Admin  . EPINEPHrine (EPI-PEN) injection 0.3 mg  0.3 mg Intramuscular Once [name], MD      . EPINEPHrine (EPI-PEN) injection 0.3 mg  0.3 mg Intramuscular Once [name], MD       Past Medical History:  Diagnosis Date  . Angina pectoris (HCC) 10/26/2019  . Anxiety, generalized 05/22/2016  . Bilateral leg edema 01/29/2020  . CAD (coronary artery disease)    Cath 2013 LAD 95% stenosis, D1 90% stenosis, RCA 20%, Circ 30%.  Previous stent to D1 2012.   EF 60%.    . Class 2 obesity due to excess calories without serious  comorbidity in adult 09/11/2016  . Confusional arousals 09/11/2016  . Depression   . Excessive daytime sleepiness 09/11/2016  . Herpes zoster 01/20/2020  . HTN (hypertension)   . Hypercholesterolemia 05/22/2016  . Hyperlipidemia   . Hypertension 05/22/2016  . Hypogonadism in male 05/22/2016  . Low testosterone   . Nephrolithiasis   . OSA (obstructive sleep apnea) 09/11/2016  . OSA on CPAP 01/03/2017  . 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 Paranoid reaction, acute (HCC) 05/22/2016  . PLMD (periodic limb movement disorder) 09/11/2016  . PUD (peptic ulcer disease)   . Screening for prostate cancer 05/22/2016  . Severe episode of recurrent major depressive disorder, without psychotic features (HCC) 09/11/2016  . Snoring 09/11/2016  . TIA (transient ischemic attack)    Past Surgical History:  Procedure Laterality Date  . CHOLECYSTECTOMY    . CORONARY ARTERY BYPASS GRAFT  02/19/12   L - LAD, SVG - D1, SVG - OM  . CORONARY STENT INTERVENTION N/A 11/05/2019   Procedure: CORONARY STENT INTERVENTION;  Surgeon: Runell Gess, MD;  Location: [facility];  Service: Cardiovascular;  Laterality: N/A;  SVG to DIAG  . LEFT HEART CATH AND CORS/GRAFTS ANGIOGRAPHY N/A 11/05/2019   Procedure: LEFT HEART CATH AND CORS/GRAFTS ANGIOGRAPHY;  Surgeon: Runell Gess, MD;  Location: [facility];  Service: Cardiovascular;  Laterality: N/A;  . RENAL ANGIOGRAPHY N/A 11/05/2019   Procedure: RENAL ANGIOGRAPHY;  Surgeon: Runell Gess, MD;  Location: [facility];  Service: Cardiovascular;  Laterality: N/A;  . TONSILLECTOMY AND ADENOIDECTOMY    . WRIST SURGERY      Family History  Problem Relation Age of Onset  . CAD Father 38  . Aneurysm Mother 79       cerebral  . Heart disease Sister        Unclear  . Healthy Brother    Social History   Socioeconomic History  . Marital status: Married    Spouse name: Not on file  . Number of children: Not on file  . Years  of education: Not on file  . Highest education level: Bachelor's degree (e.g., BA, AB, BS)  Occupational History  . Not on file  Tobacco Use  . Smoking status: Never Smoker  . Smokeless tobacco: Never Used  Vaping Use  . Vaping Use: Never used  Substance and Sexual Activity  . Alcohol use: Yes    Alcohol/week: 6.0 standard drinks    Types: 6 Cans of beer per week  . Drug use: Not on file  .  Sexual activity: Not on file  Other Topics Concern  . 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     Social Determinants of Health   Financial Resource Strain:   . Difficulty of Paying Living Expenses: Not on file  Food Insecurity:   . Worried About Programme researcher, broadcasting/film/video in the Last Year: Not on file  . Ran Out of Food in the Last Year: Not on file  Transportation Needs:   . Lack of Transportation (Medical): Not on file  . Lack of Transportation (Non-Medical): Not on file  Physical Activity:   . Days of Exercise per Week: Not on file  . Minutes of Exercise per Session: Not on file  Stress:   . Feeling of Stress : Not on file  Social Connections:   . Frequency of Communication with Friends and Family: Not on file  . Frequency of Social Gatherings with Friends and Family: Not on file  . Attends Religious Services: Not on file  . Active Member of Clubs or Organizations: Not on file  . Attends Banker Meetings: Not on file  . Marital Status: Not on file    Review of Systems  Constitutional: Negative for activity change and appetite change.  HENT: Positive for hearing loss.   Eyes: Positive for discharge and itching.  Respiratory: Negative for cough, choking and shortness of breath.   Cardiovascular: Negative for chest pain and leg swelling.  Gastrointestinal: Positive for anal bleeding and constipation.  Genitourinary: Positive for difficulty urinating.  Musculoskeletal: Negative.   Skin: Negative.     Neurological: Positive for dizziness.  Psychiatric/Behavioral: Negative.      Objective:  BP 130/84   Pulse 95   Temp 98.1 F (36.7 C) (Temporal)   Ht 5\' 10"  (1.778 m)   Wt 218 lb (98.9 kg)   SpO2 93%   BMI 31.28 kg/m   BP/Weight 08/23/2020 08/22/2020 07/25/2020  Systolic BP 130 120 110  Diastolic BP 84 80 68  Wt. (Lbs) 218 - 225  BMI 31.28 - 32.28    Physical Exam Vitals reviewed.  Constitutional:      Appearance: Normal appearance.  HENT:     Head: Normocephalic and atraumatic.     Right Ear: Tympanic membrane, ear canal and external ear normal.     Left Ear: Tympanic membrane, ear canal and external ear normal.     Mouth/Throat:     Mouth: Mucous membranes are dry.     Pharynx: Oropharynx is clear.  Eyes:     Extraocular Movements: Extraocular movements intact.     Conjunctiva/sclera: Conjunctivae normal.     Pupils: Pupils are equal, round, and reactive to light.  Cardiovascular:     Rate and Rhythm: Normal rate and regular rhythm.     Pulses: Normal pulses.     Heart sounds: Normal heart sounds.  Pulmonary:     Effort: Pulmonary effort is normal.     Breath sounds: Normal breath sounds.  Abdominal:     General: Abdomen is flat. Bowel sounds are normal.     Palpations: Abdomen is soft.  Genitourinary:    Rectum: Guaiac result positive.     Comments: Prostate enlarged Neurological:     Mental Status: Jon Gonzales is alert.       Lab Results  Component Value Date   WBC 6.1 06/07/2020   HGB 14.0 06/07/2020   HCT 41.1  06/07/2020   PLT 172 06/07/2020   GLUCOSE 104 (H) 06/07/2020   CHOL 145 06/07/2020   TRIG 119 06/07/2020   HDL 55 06/07/2020   LDLCALC 69 06/07/2020   ALT 16 06/07/2020   AST 19 06/07/2020   NA 142 06/07/2020   K 4.0 06/07/2020   CL 103 06/07/2020   CREATININE 1.24 06/07/2020   BUN 17 06/07/2020   CO2 24 06/07/2020      Assessment & Plan:   1. Benign prostatic hyperplasia with urinary hesitancy - POCT URINALYSIS DIP (CLINITEK) -  tamsulosin (FLOMAX) 0.4 MG CAPS capsule; Take 1 capsule (0.4 mg total) by mouth daily.  Dispense: 30 capsule; Refill: 3 - Ambulatory referral to Urology - PSA .AN INDIVIDUAL CARE PLANfor bph was established and reinforced today.  The patient's status was assessed using clinical findings on exam, labs, and other diagnostic testing. Patient's success at meeting treatment goals based on disease specific evidence-bassed guidelines and found to be in poor control. RECOMMENDATIONS include stat flomax and referral to urology , PSA drawn. 2. Hematochezia - Ambulatory referral to Gastroenterology - hydrocortisone-pramoxine (PROCTOFOAM HC) rectal foam; Place 1 applicator rectally 2 (two) times daily.  Dispense: 10 g; Refill: 2 - Hemoccult - 1 Card (office) Patient is having bright red blood in the bowl and on the toilet paper for at least 3 weeks.  Jon Gonzales has not been feeling weak said no nausea vomiting or diarrhea.  Jon Gonzales strains at stools which are every other day.  Jon Gonzales has no history of hemorrhoids and his last colonoscopy was 7 years ago.  Hemoccult was positive today.  I will start him on Proctofoam to try to decrease inflammation of the internal hemorrhoids as well as refer him back to GI for possible repeat colonoscopy.  Patient understands that if Jon Gonzales has any severe bleeding Jon Gonzales will let us know immediately and Jon Gonzales may  be required to go to the hospital.    Meds ordered this encounter  Medications  . tamsulosin (FLOMAX) 0.4 MG CAPS capsule    Sig: Take 1 capsule (0.4 mg total) by mouth daily.    Dispense:  30 capsule    Refill:  3  . hydrocortisone-pramoxine (PROCTOFOAM HC) rectal foam    Sig: Place 1 applicator rectally 2 (two) times daily.    Dispense:  10 g    Refill:  2    Orders Placed This Encounter  Procedures  . PSA  . Ambulatory referral to Urology  . Ambulatory referral to Gastroenterology  . POCT URINALYSIS DIP (CLINITEK)  . Hemoccult - 1 Card (office)     Follow-up: Return in about 1  month (around 09/22/2020).  An After Visit Summary was printed and given to the patient.  Brent Bulla [facility] [phone]

## 2020-08-24 LAB — PSA: Prostate Specific Ag, Serum: 1.2 ng/mL (ref 0.0–4.0)

## 2020-08-24 NOTE — Progress Notes (Signed)
PSA normal lp

## 2020-08-25 ENCOUNTER — Encounter: Payer: Self-pay | Admitting: Gastroenterology

## 2020-08-26 LAB — ANAEROBIC AND AEROBIC CULTURE

## 2020-08-28 NOTE — Progress Notes (Signed)
Culture mixed flora lp

## 2020-08-30 ENCOUNTER — Other Ambulatory Visit: Payer: Self-pay | Admitting: Legal Medicine

## 2020-08-30 DIAGNOSIS — I2510 Atherosclerotic heart disease of native coronary artery without angina pectoris: Secondary | ICD-10-CM

## 2020-08-30 MED ORDER — CLOPIDOGREL BISULFATE 75 MG PO TABS: 75.0000 mg | ORAL_TABLET | Freq: Every day | ORAL | 2 refills | Status: DC

## 2020-08-30 MED FILL — CLOPIDOGREL 75 MG TABLET: 75 | 90 days supply | Qty: 90 | Fill #0

## 2020-08-30 MED FILL — DILTIAZEM HCL ER 60 MG CP12: 60 | 90 days supply | Qty: 180 | Fill #1

## 2020-08-30 MED FILL — ROSUVASTATIN CALCIUM 20 MG: 20 | 90 days supply | Qty: 90 | Fill #2

## 2020-09-10 ENCOUNTER — Other Ambulatory Visit: Payer: No Typology Code available for payment source

## 2020-09-28 ENCOUNTER — Ambulatory Visit
Admission: RE | Admit: 2020-09-28 | Discharge: 2020-09-28 | Disposition: A | Discharge status: Home / Self Care | Payer: No Typology Code available for payment source | Source: Ambulatory Visit | Attending: Legal Medicine | Admitting: Legal Medicine

## 2020-09-28 ENCOUNTER — Other Ambulatory Visit: Payer: Self-pay

## 2020-09-28 DIAGNOSIS — H8103 Meniere's disease, bilateral: Secondary | ICD-10-CM

## 2020-09-28 IMAGING — MR MR HEAD WO/W CM
13 series · 48 of 48 positions shown · IV contrast (20ml Multihance)
Comparison: [DATE]

CLINICAL DATA: Meniere's disease. Dizziness and hearing loss.
Confusion and difficulty walking.

EXAM:
MRI HEAD WITHOUT AND WITH CONTRAST
TECHNIQUE: Multiplanar, multiecho pulse sequences of the brain and surrounding
structures were obtained without and with intravenous contrast.
CONTRAST:  20mL MULTIHANCE GADOBENATE DIMEGLUMINE 529 MG/ML IV SOLN

[Series 2: T1 · sagittal · 5.0mm · 0.47mm/px · 2 of 24 slices shown]
[im 1/24]
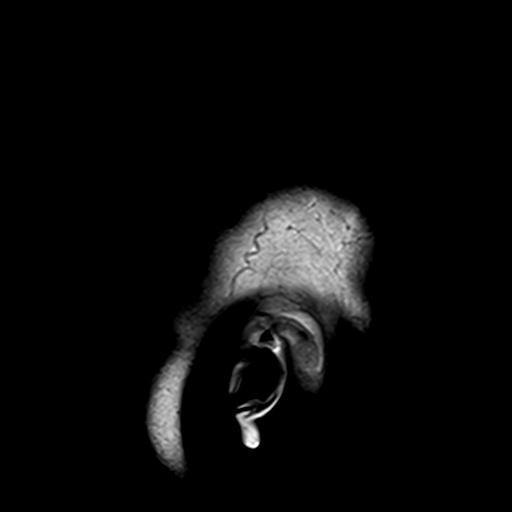
[im 24/24]
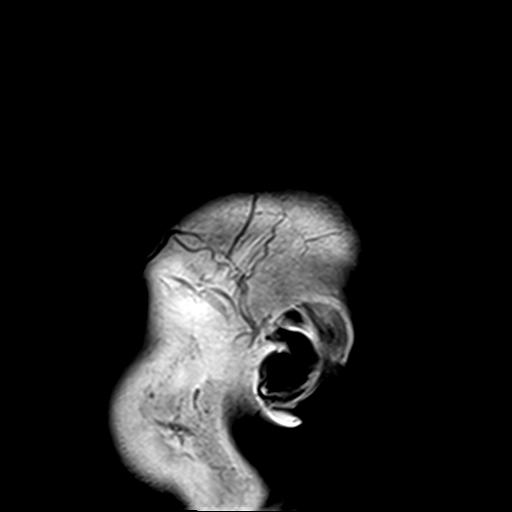

[Series 3: DWI · axial · 3.0mm · 1.88mm/px · z∈[-58,+94]mm · 6 of 104 slices shown (1 of 4)]
[im 1/104]
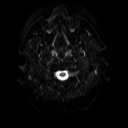
[im 21/104]
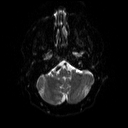
[im 42/104]
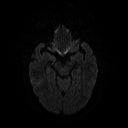
[im 62/104]
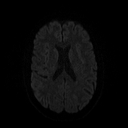
[im 83/104]
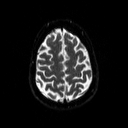
[im 104/104]
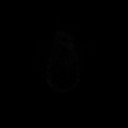

[Series 4: DWI · axial · 3.0mm · 1.88mm/px · z∈[-58,+94]mm · 3 of 50 slices shown (2 of 4)]
[im 1/50]
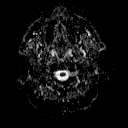
[im 25/50]
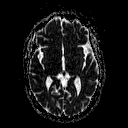
[im 50/50]
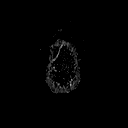

[Series 5: DWI · coronal · 5.0mm · 1.80mm/px · 5 of 78 slices shown (3 of 4)]
[im 1/78]
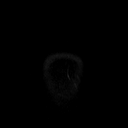
[im 20/78]
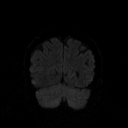
[im 39/78]
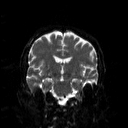
[im 58/78]
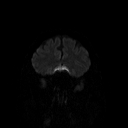
[im 78/78]
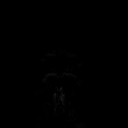

[Series 6: DWI · coronal · 5.0mm · 1.80mm/px · 2 of 38 slices shown (4 of 4)]
[im 1/38]
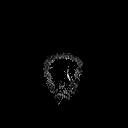
[im 38/38]
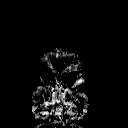

[Series 7: T2 · axial · 5.0mm · 0.72mm/px · 1 of 23 slices shown (1 of 2)]
[im 1/23]
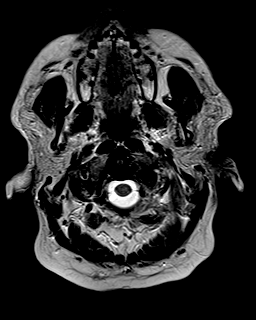

[Series 8: FLAIR · axial · 3.0mm · 0.45mm/px · z∈[-59,+94]mm · 2 of 34 slices shown]
[im 1/34]
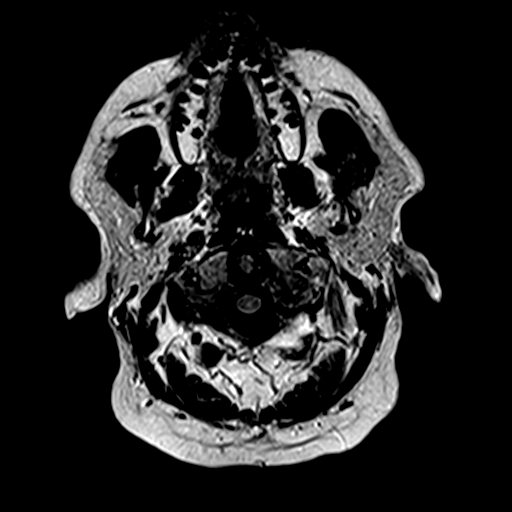
[im 34/34]
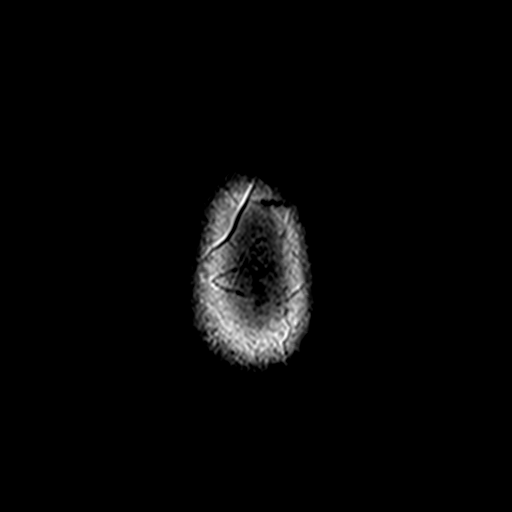

[Series 10: swi_images · axial · 4.0mm · 0.90mm/px · z∈[-60,+95]mm · 2 of 40 slices shown]
[im 1/40]
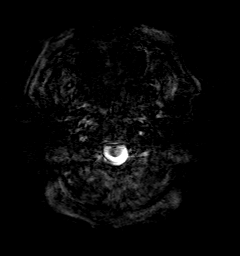
[im 40/40]
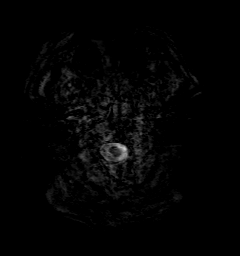

[Series 11: t1_mpr_tra · axial · 1.0mm · 0.75mm/px · z∈[-61,+97]mm · 10 of 160 slices shown (1 of 2)]
[im 1/160]
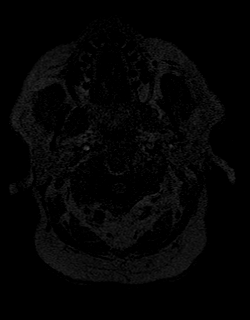
[im 18/160]
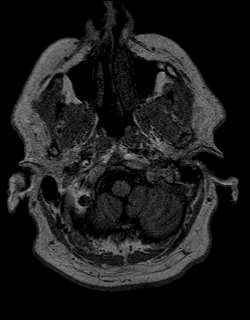
[im 36/160]
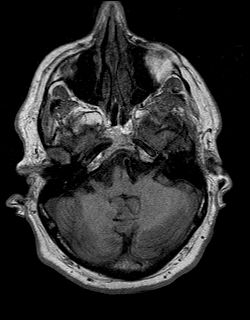
[im 54/160]
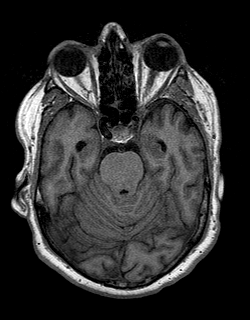
[im 71/160]
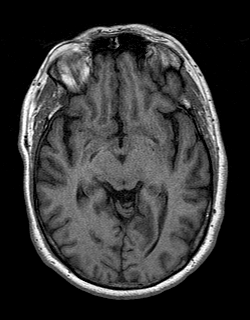
[im 89/160]
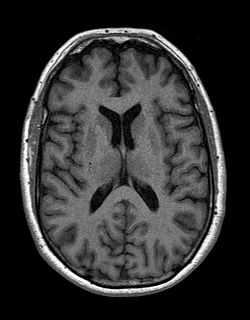
[im 107/160]
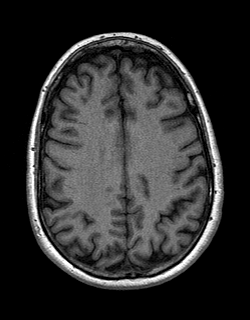
[im 124/160]
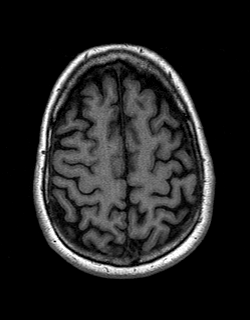
[im 142/160]
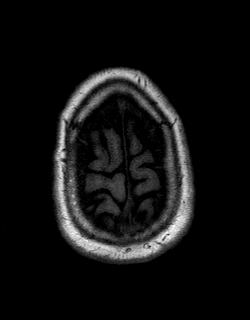
[im 160/160]
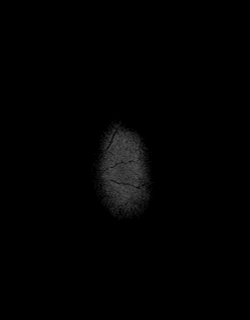

[Series 12: T2 · coronal · 5.0mm · 0.45mm/px · 2 of 29 slices shown (2 of 2)]
[im 1/29]
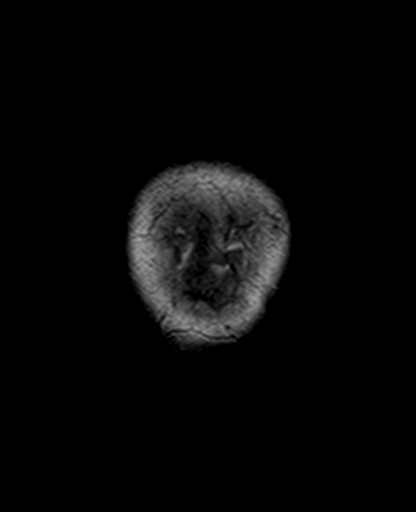
[im 29/29]
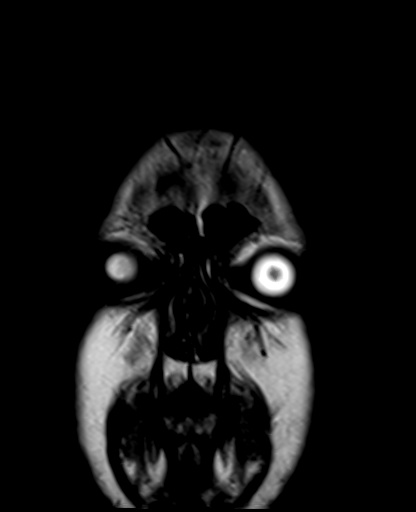

[Series 13: t1_mpr_tra · axial · 1.0mm · 0.75mm/px · z∈[-61,+97]mm · 10 of 160 slices shown (2 of 2)]
[im 1/160]
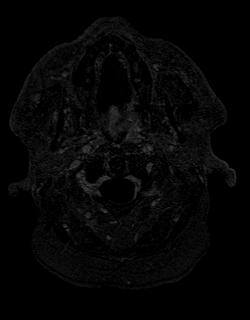
[im 18/160]
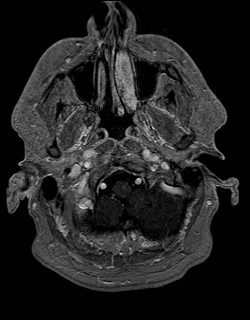
[im 36/160]
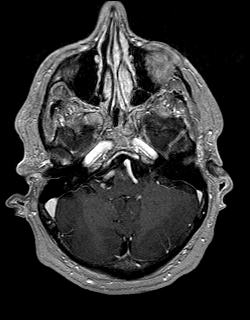
[im 54/160]
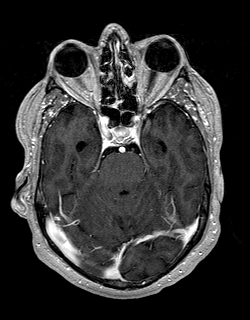
[im 71/160]
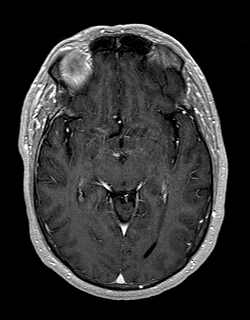
[im 89/160]
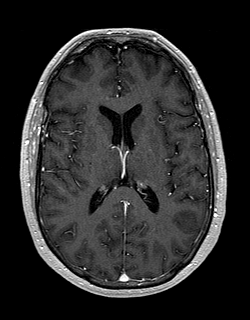
[im 107/160]
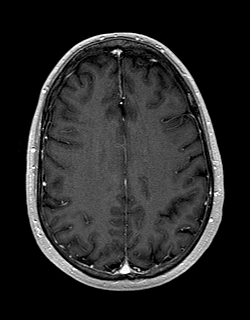
[im 124/160]
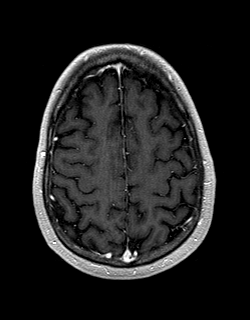
[im 142/160]
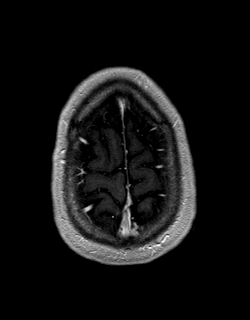
[im 160/160]
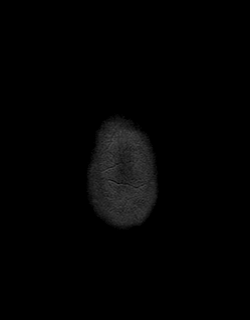

[Series 14: post cor · coronal · 5.0mm · 0.45mm/px · 2 of 29 slices shown]
[im 1/29]
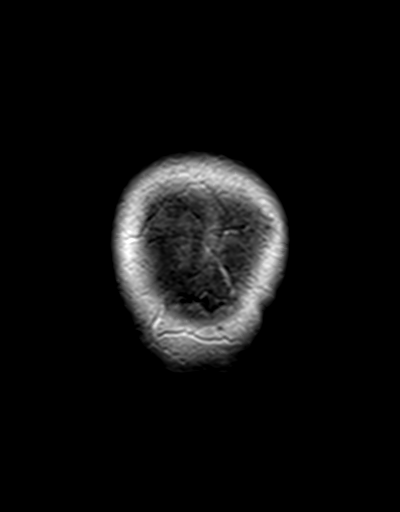
[im 29/29]
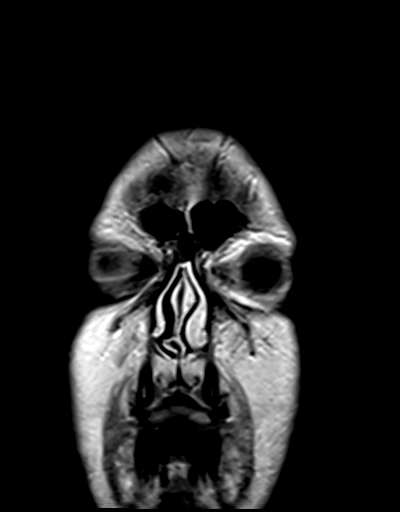

[Series 15: post sag · sagittal · 5.0mm · 0.47mm/px · 1 of 24 slices shown]
[im 1/24]
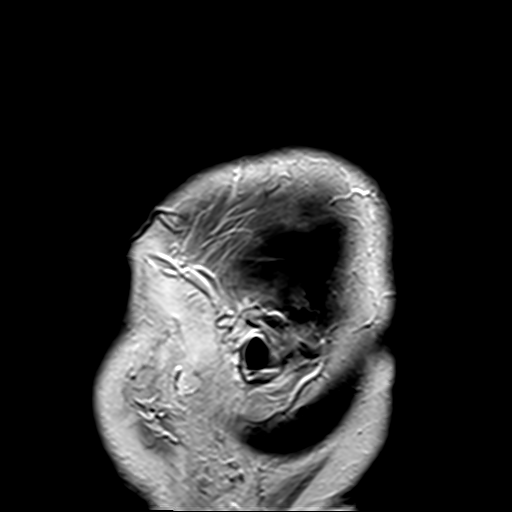

[48 of 48 positions shown; findings below may reference images not displayed]

FINDINGS: Brain: There is no evidence of an acute infarct, intracranial
hemorrhage, mass, midline shift, or extra-axial fluid collection.
The ventricles and sulci are normal. A few punctate foci of T2
hyperintensity in the cerebral white matter are within normal limits
for age. No abnormal enhancement is identified. Limited assessment
of the seventh and eighth cranial nerve complexes is unremarkable on
this nondedicated study, and no mass is evident in the
cerebellopontine angles or internal auditory canals.

Vascular: Major intracranial vascular flow voids are preserved.

Skull and upper cervical spine: Unremarkable bone marrow signal.

Sinuses/Orbits: Unremarkable orbits. Mild scattered mucosal
thickening in the paranasal sinuses. Clear mastoid air cells.

Other: None.
IMPRESSION: Unremarkable appearance of the brain for age.

## 2020-09-28 MED ORDER — GADOBENATE DIMEGLUMINE 529 MG/ML IV SOLN: 20.0000 mL | Freq: Once | INTRAVENOUS | Status: DC | PRN

## 2020-09-28 MED ORDER — GADOBENATE DIMEGLUMINE 529 MG/ML IV SOLN
20.0000 mL | Freq: Once | INTRAVENOUS | Status: AC | PRN
  Administered 2020-09-28: 20 mL via INTRAVENOUS

## 2020-09-28 NOTE — Progress Notes (Signed)
MRI head is normal lp

## 2020-10-17 ENCOUNTER — Other Ambulatory Visit: Payer: Self-pay | Admitting: Legal Medicine

## 2020-10-17 DIAGNOSIS — R3911 Hesitancy of micturition: Secondary | ICD-10-CM

## 2020-10-17 DIAGNOSIS — I10 Essential (primary) hypertension: Secondary | ICD-10-CM

## 2020-10-17 DIAGNOSIS — N401 Enlarged prostate with lower urinary tract symptoms: Secondary | ICD-10-CM

## 2020-10-17 MED ORDER — TAMSULOSIN HCL 0.4 MG PO CAPS: 0.4000 mg | ORAL_CAPSULE | Freq: Two times a day (BID) | ORAL | 3 refills | Status: DC

## 2020-10-17 MED ORDER — METOPROLOL SUCCINATE ER 100 MG PO TB24: 100.0000 mg | ORAL_TABLET | Freq: Every day | ORAL | 2 refills | Status: DC

## 2020-10-17 MED FILL — TAMSULOSIN HCL 0.4 MG CAP: 0.4 | 90 days supply | Qty: 180 | Fill #0

## 2020-10-17 MED FILL — METOPROLOL SUCCINATE ER 100: 100 | 90 days supply | Qty: 90 | Fill #0

## 2020-10-19 ENCOUNTER — Ambulatory Visit: Payer: No Typology Code available for payment source | Admitting: Gastroenterology

## 2020-10-23 MED FILL — LITHIUM CARBONATE ER 300 MG: 300 | 90 days supply | Qty: 180 | Fill #2

## 2020-10-24 ENCOUNTER — Other Ambulatory Visit: Payer: Self-pay | Admitting: Legal Medicine

## 2020-10-24 DIAGNOSIS — F411 Generalized anxiety disorder: Secondary | ICD-10-CM

## 2020-10-24 MED ORDER — DULOXETINE HCL 60 MG PO CPEP: 60.0000 mg | ORAL_CAPSULE | Freq: Two times a day (BID) | ORAL | 2 refills | Status: DC

## 2020-10-24 MED FILL — RANOLAZINE ER 1000 MG TB12: 1000 | 90 days supply | Qty: 180 | Fill #2

## 2020-10-24 MED FILL — DULOXETINE HCL 60 MG CPEP: 60 | 90 days supply | Qty: 180 | Fill #0

## 2020-10-25 ENCOUNTER — Ambulatory Visit (INDEPENDENT_AMBULATORY_CARE_PROVIDER_SITE_OTHER): Payer: No Typology Code available for payment source | Admitting: Family Medicine

## 2020-10-25 ENCOUNTER — Encounter: Payer: Self-pay | Admitting: Family Medicine

## 2020-10-25 VITALS — BP 102/64 | HR 66

## 2020-10-25 DIAGNOSIS — I10 Essential (primary) hypertension: Secondary | ICD-10-CM | POA: Diagnosis not present

## 2020-10-25 DIAGNOSIS — R42 Dizziness and giddiness: Secondary | ICD-10-CM

## 2020-10-25 DIAGNOSIS — R5383 Other fatigue: Secondary | ICD-10-CM | POA: Diagnosis not present

## 2020-10-25 NOTE — Progress Notes (Signed)
Acute Office Visit  Subjective:    Patient ID: Jon Gonzales, male    DOB: 12-Mar-1961, 60 y.o.   MRN: [id number]  Cc: vertigo x 3 months-MRI normal  HPI Patient is in today for fatigue and vertigo-onset 3 months ago associated with decrease hearing and ringing in the ears-prednisone improved hearing. Pt was started on Dyazide for possible Meniere's disease . MRI mild scattered mucosal thickening in the paranasal sinuses  System Review:  GI-[facility]-08/23/20-heme + stools -protofoam-pt cancelled appointment for evaluation with Dr. Lyndel Safe last week /PUD-Prevacid-pt uses daily Goody Powder-admits to bitter taste in his mouth Urology-urinary hesitancy and smaller stream-followed by Dr. Nila Nephew in th epast-started on Flomax-prostate exam-enlarged-11/9, PSA wnl Cardio-h/o bypass and stents-5/21-(9/21-Dr. Revankar-no testing)  Past Medical History:  Diagnosis Date  . Angina pectoris ([place]) 10/26/2019  . Anxiety, generalized 05/22/2016  . Bilateral leg edema 01/29/2020  . CAD (coronary artery disease)    Cath 2013 LAD 95% stenosis, D1 90% stenosis, RCA 20%, Circ 30%.  Previous stent to D1 2012.   EF 60%.    . Class 2 obesity due to excess calories without serious comorbidity in adult 09/11/2016  . Confusional arousals 09/11/2016  . Depression   . Excessive daytime sleepiness 09/11/2016  . Herpes zoster 01/20/2020  . HTN (hypertension)   . Hypercholesterolemia 05/22/2016  . Hyperlipidemia   . Hypertension 05/22/2016  . Hypogonadism in male 05/22/2016  . Low testosterone   . Nephrolithiasis   . OSA (obstructive sleep apnea) 09/11/2016  . OSA on CPAP 01/03/2017  . 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 Paranoid reaction, acute ([place]) 05/22/2016  . PLMD (periodic limb movement disorder) 09/11/2016  . PUD (peptic ulcer disease)   . Screening for prostate cancer 05/22/2016  . Severe episode of recurrent major depressive disorder, without psychotic features ([place]) 09/11/2016  . Snoring  09/11/2016  . TIA (transient ischemic attack)     Past Surgical History:  Procedure Laterality Date  . CHOLECYSTECTOMY    . CORONARY ARTERY BYPASS GRAFT  02/19/12   L - LAD, SVG - D1, SVG - OM  . CORONARY STENT INTERVENTION N/A 11/05/2019   Procedure: CORONARY STENT INTERVENTION;  Surgeon: Lorretta Harp, MD;  Location: [facility];  Service: Cardiovascular;  Laterality: N/A;  SVG to DIAG  . LEFT HEART CATH AND CORS/GRAFTS ANGIOGRAPHY N/A 11/05/2019   Procedure: LEFT HEART CATH AND CORS/GRAFTS ANGIOGRAPHY;  Surgeon: Lorretta Harp, MD;  Location: [facility];  Service: Cardiovascular;  Laterality: N/A;  . RENAL ANGIOGRAPHY N/A 11/05/2019   Procedure: RENAL ANGIOGRAPHY;  Surgeon: Lorretta Harp, MD;  Location: [facility];  Service: Cardiovascular;  Laterality: N/A;  . TONSILLECTOMY AND ADENOIDECTOMY    . WRIST SURGERY      Family History  Problem Relation Age of Onset  . CAD Father 51  . Aneurysm Mother 23       cerebral  . Heart disease Sister        Unclear  . Healthy Brother     Social History   Socioeconomic History  . Marital status: Married    Spouse name: Not on file  . Number of children: Not on file  . Years of education: Not on file  . Highest education level: Bachelor's degree (e.g., BA, AB, BS)  Occupational History  . Not on file  Tobacco Use  . Smoking status: Never Smoker  . Smokeless tobacco: Never Used  Vaping Use  . Vaping Use:  Never used  Substance and Sexual Activity  . Alcohol use: Yes    Alcohol/week: 6.0 standard drinks    Types: 6 Cans of beer per week  . Drug use: Not on file  . Sexual activity: Not on file  Other Topics Concern  . 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     Social Determinants of Health   Financial Resource Strain: Not on file  Food Insecurity: Not on file  Transportation Needs: Not on file  Physical Activity: Not on  file  Stress: Not on file  Social Connections: Not on file  Intimate Partner Violence: Not on file    Outpatient Medications Prior to Visit  Medication Sig Dispense Refill  . acetaminophen (TYLENOL) 500 MG tablet Take 1,000 mg by mouth every 6 (six) hours as needed (for pain.).    Marland Kitchen aspirin EC 81 MG tablet Take 1 tablet (81 mg total) by mouth daily. 30 tablet 0  . cephALEXin (KEFLEX) 500 MG capsule Take 1 capsule (500 mg total) by mouth 4 (four) times daily. 20 capsule 0  . cloNIDine (CATAPRES) 0.1 MG tablet Take 1 tablet (0.1 mg total) by mouth 2 (two) times daily. 60 tablet 2  . clopidogrel (PLAVIX) 75 MG tablet Take 1 tablet (75 mg total) by mouth daily. 90 tablet 2  . diltiazem (CARDIZEM SR) 60 MG 12 hr capsule Take 1 capsule (60 mg total) by mouth 2 (two) times daily. 180 capsule 2  . DULoxetine (CYMBALTA) 60 MG capsule TAKE 1 CAPSULE BY MOUTH TWICE DAILY 180 capsule 2  . EPINEPHrine (EPIPEN 2-PAK) 0.3 mg/0.3 mL IJ SOAJ injection Inject 0.3 mLs (0.3 mg total) into the muscle as needed for anaphylaxis. 1 each 3  . Eszopiclone 3 MG TABS Take 1 tablet (3 mg total) by mouth at bedtime. Take immediately before bedtime 90 tablet 2  . fexofenadine (ALLEGRA) 60 MG tablet Take 60 mg by mouth daily.    . hydrocortisone-pramoxine (PROCTOFOAM HC) rectal foam Place 1 applicator rectally 2 (two) times daily. 10 g 2  . lansoprazole (PREVACID) 30 MG capsule Take 1 capsule (30 mg total) by mouth daily at 12 noon. 90 capsule 2  . lithium carbonate (LITHOBID) 300 MG CR tablet Take 1 tablet (300 mg total) by mouth 2 (two) times daily. 180 tablet 2  . LORazepam (ATIVAN) 1 MG tablet Take 1 tablet (1 mg total) by mouth in the morning, at noon, and at bedtime. 180 tablet 2  . losartan (COZAAR) 100 MG tablet Take 1 tablet (100 mg total) by mouth daily. 90 tablet 3  . metoprolol succinate (TOPROL-XL) 100 MG 24 hr tablet Take 1 tablet (100 mg total) by mouth daily. 90 tablet 2  . Multiple Vitamin (MULTIVITAMIN  WITH MINERALS) TABS tablet Take 1 tablet by mouth daily. Men's Multivitamin    . nitroGLYCERIN (NITROSTAT) 0.4 MG SL tablet Place 0.4 mg under the tongue every 5 (five) minutes x 3 doses as needed for chest pain.     . predniSONE (DELTASONE) 20 MG tablet Take 1 tablet (20 mg total) by mouth 3 (three) times daily. 30 tablet 0  . ranolazine (RANEXA) 1000 MG SR tablet Take 1 tablet (1,000 mg total) by mouth 2 (two) times daily. 180 tablet 3  . rosuvastatin (CRESTOR) 20 MG tablet Take 1 tablet (20 mg total) by mouth at bedtime. 90 tablet 2  . Saw Palmetto 450  MG CAPS Take 450 mg by mouth 2 (two) times daily.    . tamsulosin (FLOMAX) 0.4 MG CAPS capsule Take 1 capsule (0.4 mg total) by mouth in the morning and at bedtime. 180 capsule 3  . triamterene-hydrochlorothiazide (DYAZIDE) 37.5-25 MG capsule Take 1 each (1 capsule total) by mouth daily. 90 capsule 2   Facility-Administered Medications Prior to Visit  Medication Dose Route Frequency Provider Last Rate Last Admin  . EPINEPHrine (EPI-PEN) injection 0.3 mg  0.3 mg Intramuscular Once Lillard Anes, MD      . EPINEPHrine (EPI-PEN) injection 0.3 mg  0.3 mg Intramuscular Once Lillard Anes, MD        Allergies  Allergen Reactions  . Trintellix [Vortioxetine] Other (See Comments)    Headaches.  . Penicillins Rash    Did it involve swelling of the face/tongue/throat, SOB, or low BP? Unknown Did it involve sudden or severe rash/hives, skin peeling, or any reaction on the inside of your mouth or nose? Unknown Did you need to seek medical attention at a hospital or doctor's office? Unknown When did it last happen?childhood reaction If all above answers are "NO", may proceed with cephalosporin use.     Review of Systems  Constitutional: Positive for activity change and fatigue. Negative for appetite change and fever.       Decrease exercise tolerance-no regular exercise  Respiratory: Negative.   Cardiovascular: Positive  for leg swelling. Negative for chest pain and palpitations.  Gastrointestinal: Positive for anal bleeding and blood in stool. Negative for diarrhea, nausea and vomiting.  Genitourinary: Positive for decreased urine volume.       Enlarged prostate  Neurological: Positive for dizziness and headaches. Negative for tremors, syncope and weakness.       Uses Goody Powder daily  Psychiatric/Behavioral: Positive for dysphoric mood. The patient is not nervous/anxious.        Worried about step daughter       Objective:    Physical Exam Constitutional:      General: He is not in acute distress.    Appearance: He is ill-appearing.  HENT:     Head: Normocephalic and atraumatic.     Nose: Nose normal.     Mouth/Throat:     Mouth: Mucous membranes are moist.  Cardiovascular:     Rate and Rhythm: Normal rate and regular rhythm.     Pulses: Normal pulses.     Heart sounds: Normal heart sounds.  Pulmonary:     Effort: Pulmonary effort is normal.     Breath sounds: Normal breath sounds.  Musculoskeletal:     Cervical back: Normal range of motion and neck supple.  Neurological:     Mental Status: He is alert.     There were no vitals taken for this visit. Wt Readings from Last 3 Encounters:  08/23/20 218 lb (98.9 kg)  07/25/20 225 lb (102.1 kg)  07/05/20 221 lb 12.8 oz (100.6 kg)    Health Maintenance Due  Topic Date Due  . Hepatitis C Screening  Never done  . HIV Screening  Never done  . TETANUS/TDAP  Never done  . COVID-19 Vaccine (2 - Pfizer 3-dose booster series) 07/19/2020     No results found for: TSH Lab Results  Component Value Date   WBC 6.1 06/07/2020   HGB 14.0 06/07/2020   HCT 41.1 06/07/2020   MCV 96 06/07/2020   PLT 172 06/07/2020   Lab Results  Component Value Date   NA 142 06/07/2020  K 4.0 06/07/2020   CO2 24 06/07/2020   GLUCOSE 104 (H) 06/07/2020   BUN 17 06/07/2020   CREATININE 1.24 06/07/2020   BILITOT 0.9 06/07/2020   ALKPHOS 48 06/07/2020    AST 19 06/07/2020   ALT 16 06/07/2020   PROT 7.0 06/07/2020   ALBUMIN 4.6 06/07/2020   CALCIUM 9.1 06/07/2020   Lab Results  Component Value Date   CHOL 145 06/07/2020   Lab Results  Component Value Date   HDL 55 06/07/2020   Lab Results  Component Value Date   LDLCALC 69 06/07/2020   Lab Results  Component Value Date   TRIG 119 06/07/2020   Lab Results  Component Value Date   CHOLHDL 2.6 06/07/2020   ECG-Sr with St-T wave changes noted V2, V3 as compared to 01-29-20    Assessment & Plan:   1. Primary hypertension Lying 102/62 bp/pulse 62/oxygen 95%, sitting 102/64 pulse 66, oxygen 98%, standing 98/60, pulse 72-STOP diazide-no improvement in dizziness while taking. Consider discontinue of flomax if no improvement - CBC with Differential - CMP14+EGFR - TSH - Ambulatory referral to Cardiology  2. Dizziness Hypotensive-h/o of blood in stool and ulcer disease - CBC with Differential - CMP14+EGFR - TSH - Ambulatory referral to Cardiology  3. Other fatigue ecg changes noted on comparison with 01/29/20 ecg-denies CP/SOB - CBC with Differential - CMP14+EGFR - TSH - Ambulatory referral to Cardiology Kathalene Sporer Hannah Beat, MD

## 2020-10-26 LAB — CBC WITH DIFFERENTIAL/PLATELET
Basophils Absolute: 0.1 10*3/uL (ref 0.0–0.2)
Basos: 1 %
EOS (ABSOLUTE): 0.4 10*3/uL (ref 0.0–0.4)
Eos: 5 %
Hematocrit: 42.9 % (ref 37.5–51.0)
Hemoglobin: 14.8 g/dL (ref 13.0–17.7)
Immature Grans (Abs): 0 10*3/uL (ref 0.0–0.1)
Immature Granulocytes: 1 %
Lymphocytes Absolute: 1.9 10*3/uL (ref 0.7–3.1)
Lymphs: 22 %
MCH: 34.2 pg — ABNORMAL HIGH (ref 26.6–33.0)
MCHC: 34.5 g/dL (ref 31.5–35.7)
MCV: 99 fL — ABNORMAL HIGH (ref 79–97)
Monocytes Absolute: 0.8 10*3/uL (ref 0.1–0.9)
Monocytes: 9 %
Neutrophils Absolute: 5.4 10*3/uL (ref 1.4–7.0)
Neutrophils: 62 %
Platelets: 193 10*3/uL (ref 150–450)
RBC: 4.33 x10E6/uL (ref 4.14–5.80)
RDW: 12.3 % (ref 11.6–15.4)
WBC: 8.6 10*3/uL (ref 3.4–10.8)

## 2020-10-26 LAB — CMP14+EGFR
ALT: 20 IU/L (ref 0–44)
AST: 18 IU/L (ref 0–40)
Albumin/Globulin Ratio: 1.8 (ref 1.2–2.2)
Albumin: 4.8 g/dL (ref 3.8–4.9)
Alkaline Phosphatase: 52 IU/L (ref 44–121)
BUN/Creatinine Ratio: 17 (ref 9–20)
BUN: 22 mg/dL (ref 6–24)
Bilirubin Total: 0.6 mg/dL (ref 0.0–1.2)
CO2: 23 mmol/L (ref 20–29)
Calcium: 9.5 mg/dL (ref 8.7–10.2)
Chloride: 99 mmol/L (ref 96–106)
Creatinine, Ser: 1.26 mg/dL (ref 0.76–1.27)
GFR calc Af Amer: 72 mL/min/{1.73_m2} (ref >59)
GFR calc non Af Amer: 62 mL/min/{1.73_m2} (ref >59)
Globulin, Total: 2.7 g/dL (ref 1.5–4.5)
Glucose: 84 mg/dL (ref 65–99)
Potassium: 4.3 mmol/L (ref 3.5–5.2)
Sodium: 136 mmol/L (ref 134–144)
Total Protein: 7.5 g/dL (ref 6.0–8.5)

## 2020-10-26 LAB — TSH: TSH: 2.27 u[IU]/mL (ref 0.450–4.500)

## 2020-10-27 DIAGNOSIS — R5383 Other fatigue: Secondary | ICD-10-CM

## 2020-10-27 DIAGNOSIS — R42 Dizziness and giddiness: Secondary | ICD-10-CM

## 2020-10-27 HISTORY — DX: Dizziness and giddiness: R42

## 2020-10-27 HISTORY — DX: Other fatigue: R53.83

## 2020-10-28 MED FILL — LORazepam 1 MG TABS: 1 | 60 days supply | Qty: 180 | Fill #1

## 2020-10-28 MED FILL — LANSOPRAZOLE 30 MG CPDR: 30 | 90 days supply | Qty: 90 | Fill #1

## 2020-10-28 MED FILL — LOSARTAN POTASSIUM 100 MG T: 100 | 90 days supply | Qty: 90 | Fill #1

## 2020-11-07 ENCOUNTER — Ambulatory Visit (INDEPENDENT_AMBULATORY_CARE_PROVIDER_SITE_OTHER): Payer: No Typology Code available for payment source | Admitting: Legal Medicine

## 2020-11-07 ENCOUNTER — Encounter: Payer: Self-pay | Admitting: Legal Medicine

## 2020-11-07 ENCOUNTER — Other Ambulatory Visit: Payer: Self-pay

## 2020-11-07 VITALS — BP 94/60 | HR 100 | Temp 97.3°F

## 2020-11-07 DIAGNOSIS — I959 Hypotension, unspecified: Secondary | ICD-10-CM | POA: Insufficient documentation

## 2020-11-07 DIAGNOSIS — E860 Dehydration: Secondary | ICD-10-CM

## 2020-11-07 DIAGNOSIS — I9589 Other hypotension: Secondary | ICD-10-CM | POA: Diagnosis not present

## 2020-11-07 DIAGNOSIS — E861 Hypovolemia: Secondary | ICD-10-CM | POA: Diagnosis not present

## 2020-11-07 HISTORY — DX: Hypotension, unspecified: I95.9

## 2020-11-07 HISTORY — DX: Dehydration: E86.0

## 2020-11-07 NOTE — Progress Notes (Signed)
Subjective:  Patient ID: Jon Gonzales, male    DOB: 16-Apr-1961  Age: 60 y.o. MRN: [id number]  Chief Complaint  Patient presents with  . Hypotension    HPI: Patient was seen on an emergent basis in the office patient was feeling very dizzy and weak and fell back in his chair.  We laid the patient down and blood pressure was 94/60 with pulse of 100.  He was sallow and sweaty.  Not having any chest pain or shortness of breath.  He has not been drinking much fluids lately.  Orthostatics show that sitting up is good he was extremely dizzy and blood pressure was 88/60 with pulse of 66 upon standing we were unable to get a blood pressure of the patient was awake.  Patient was put in Trendelenburg and given oral fluids and we discussed the possible need for IV fluids.  His rapid Covid test was negative.  We will send him home with Dr. Sedalia Muta. Current Outpatient Medications on File Prior to Visit  Medication Sig Dispense Refill  . acetaminophen (TYLENOL) 500 MG tablet Take 1,000 mg by mouth every 6 (six) hours as needed (for pain.).    Marland Kitchen aspirin EC 81 MG tablet Take 1 tablet (81 mg total) by mouth daily. 30 tablet 0  . clopidogrel (PLAVIX) 75 MG tablet Take 1 tablet (75 mg total) by mouth daily. 90 tablet 2  . DULoxetine (CYMBALTA) 60 MG capsule TAKE 1 CAPSULE BY MOUTH TWICE DAILY 180 capsule 2  . Eszopiclone 3 MG TABS Take 1 tablet (3 mg total) by mouth at bedtime. Take immediately before bedtime 90 tablet 2  . fexofenadine (ALLEGRA) 60 MG tablet Take 60 mg by mouth daily.    . hydrocortisone-pramoxine (PROCTOFOAM HC) rectal foam Place 1 applicator rectally 2 (two) times daily. 10 g 2  . lansoprazole (PREVACID) 30 MG capsule Take 1 capsule (30 mg total) by mouth daily at 12 noon. 90 capsule 2  . lithium carbonate (LITHOBID) 300 MG CR tablet Take 1 tablet (300 mg total) by mouth 2 (two) times daily. 180 tablet 2  . LORazepam (ATIVAN) 1 MG tablet Take 1 tablet (1 mg total) by mouth in the morning, at noon,  and at bedtime. 180 tablet 2  . losartan (COZAAR) 100 MG tablet Take 1 tablet (100 mg total) by mouth daily. 90 tablet 3  . metoprolol succinate (TOPROL-XL) 100 MG 24 hr tablet Take 1 tablet (100 mg total) by mouth daily. 90 tablet 2  . Multiple Vitamin (MULTIVITAMIN WITH MINERALS) TABS tablet Take 1 tablet by mouth daily. Men's Multivitamin    . nitroGLYCERIN (NITROSTAT) 0.4 MG SL tablet Place 0.4 mg under the tongue every 5 (five) minutes x 3 doses as needed for chest pain.     . predniSONE (DELTASONE) 20 MG tablet Take 1 tablet (20 mg total) by mouth 3 (three) times daily. 30 tablet 0  . ranolazine (RANEXA) 1000 MG SR tablet Take 1 tablet (1,000 mg total) by mouth 2 (two) times daily. 180 tablet 3  . rosuvastatin (CRESTOR) 20 MG tablet Take 1 tablet (20 mg total) by mouth at bedtime. 90 tablet 2  . Saw Palmetto 450 MG CAPS Take 450 mg by mouth 2 (two) times daily.     No current facility-administered medications on file prior to visit.   Past Medical History:  Diagnosis Date  . Angina pectoris ([facility]) 10/26/2019  . Anxiety, generalized 05/22/2016  . Bilateral leg edema 01/29/2020  . CAD (coronary artery  disease)    Cath 2013 LAD 95% stenosis, D1 90% stenosis, RCA 20%, Circ 30%.  Previous stent to D1 2012.   EF 60%.    . Class 2 obesity due to excess calories without serious comorbidity in adult 09/11/2016  . Confusional arousals 09/11/2016  . Depression   . Excessive daytime sleepiness 09/11/2016  . Herpes zoster 01/20/2020  . HTN (hypertension)   . Hypercholesterolemia 05/22/2016  . Hyperlipidemia   . Hypertension 05/22/2016  . Hypogonadism in male 05/22/2016  . Low testosterone   . Nephrolithiasis   . OSA (obstructive sleep apnea) 09/11/2016  . OSA on CPAP 01/03/2017  . 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 Paranoid reaction, acute ([facility]) 05/22/2016  . PLMD (periodic limb movement disorder) 09/11/2016  . PUD (peptic ulcer disease)   . Screening for prostate cancer  05/22/2016  . Severe episode of recurrent major depressive disorder, without psychotic features ([facility]) 09/11/2016  . Snoring 09/11/2016  . TIA (transient ischemic attack)    Past Surgical History:  Procedure Laterality Date  . CHOLECYSTECTOMY    . CORONARY ARTERY BYPASS GRAFT  02/19/12   L - LAD, SVG - D1, SVG - OM  . CORONARY STENT INTERVENTION N/A 11/05/2019   Procedure: CORONARY STENT INTERVENTION;  Surgeon: Runell Gess, MD;  Location: [facility];  Service: Cardiovascular;  Laterality: N/A;  SVG to DIAG  . LEFT HEART CATH AND CORS/GRAFTS ANGIOGRAPHY N/A 11/05/2019   Procedure: LEFT HEART CATH AND CORS/GRAFTS ANGIOGRAPHY;  Surgeon: Runell Gess, MD;  Location: [facility];  Service: Cardiovascular;  Laterality: N/A;  . RENAL ANGIOGRAPHY N/A 11/05/2019   Procedure: RENAL ANGIOGRAPHY;  Surgeon: Runell Gess, MD;  Location: [facility];  Service: Cardiovascular;  Laterality: N/A;  . TONSILLECTOMY AND ADENOIDECTOMY    . WRIST SURGERY      Family History  Problem Relation Age of Onset  . CAD Father 65  . Aneurysm Mother 76       cerebral  . Heart disease Sister        Unclear  . Healthy Brother    Social History   Socioeconomic History  . Marital status: Married    Spouse name: Not on file  . Number of children: Not on file  . Years of education: Not on file  . Highest education level: Bachelor's degree (e.g., BA, AB, BS)  Occupational History  . Not on file  Tobacco Use  . Smoking status: Never Smoker  . Smokeless tobacco: Never Used  Vaping Use  . Vaping Use: Never used  Substance and Sexual Activity  . Alcohol use: Yes    Alcohol/week: 6.0 standard drinks    Types: 6 Cans of beer per week  . Drug use: Not on file  . Sexual activity: Not on file  Other Topics Concern  . 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     Social Determinants of Health   Financial  Resource Strain: Not on file  Food Insecurity: Not on file  Transportation Needs: Not on file  Physical Activity: Not on file  Stress: Not on file  Social Connections: Not on file    Review of Systems  Constitutional: Negative for activity change and appetite change.  HENT: Negative for congestion and rhinorrhea.   Eyes: Negative for visual disturbance.  Respiratory: Negative for chest tightness and shortness of  breath.   Cardiovascular: Negative for chest pain, palpitations and leg swelling.  Gastrointestinal: Negative.   Genitourinary: Negative for difficulty urinating, dysuria and urgency.  Musculoskeletal: Negative.   Skin: Negative.   Neurological: Positive for dizziness and weakness.  Psychiatric/Behavioral: Negative.      Objective:  BP 94/60   Pulse 100   Temp (!) 97.3 F (36.3 C)   BP/Weight 11/07/2020 10/25/2020 08/23/2020  Systolic BP 94 102 130  Diastolic BP 60 64 84  Wt. (Lbs) - - 218  BMI - - 31.28    Physical Exam Vitals reviewed.  Constitutional:      General: He is not in acute distress. Eyes:     Extraocular Movements: Extraocular movements intact.     Conjunctiva/sclera: Conjunctivae normal.     Pupils: Pupils are equal, round, and reactive to light.  Cardiovascular:     Rate and Rhythm: Normal rate and regular rhythm.     Pulses: Normal pulses.     Heart sounds: Normal heart sounds. No murmur heard. No gallop.   Pulmonary:     Effort: Pulmonary effort is normal. No respiratory distress.     Breath sounds: Normal breath sounds. No wheezing.  Abdominal:     General: Abdomen is flat. Bowel sounds are normal.     Palpations: Abdomen is soft.  Musculoskeletal:        General: Normal range of motion.  Skin:    Capillary Refill: Capillary refill takes less than 2 seconds.     Coloration: Skin is pale.  Neurological:     Mental Status: He is alert.     Motor: Weakness present.      Lab Results  Component Value Date   WBC 8.6 10/25/2020    HGB 14.8 10/25/2020   HCT 42.9 10/25/2020   PLT 193 10/25/2020   GLUCOSE 84 10/25/2020   CHOL 145 06/07/2020   TRIG 119 06/07/2020   HDL 55 06/07/2020   LDLCALC 69 06/07/2020   ALT 20 10/25/2020   AST 18 10/25/2020   NA 136 10/25/2020   K 4.3 10/25/2020   CL 99 10/25/2020   CREATININE 1.26 10/25/2020   BUN 22 10/25/2020   CO2 23 10/25/2020   TSH 2.270 10/25/2020      Assessment & Plan:  Diagnoses and all orders for this visit: Hypotension due to hypovolemia -     CBC with Differential/Platelet-     Comprehensive metabolic panel patient has low BP and orthostatics, diltiazem will be stopped and tamsulosin.  He is to drink plenty of fluids. Dehydration Encourage drinking water, stopping on BP medicine       Orders Placed This Encounter  Procedures  . CBC with Differential/Platelet  . Comprehensive metabolic panel      I spent dedicated to the care of this patient on the date of this encounter to include face-to-face time with the patient, as well as:   Follow-up: Return in about 1 week (around 11/14/2020) for dehydration.  An After Visit Summary was printed and given to the patient.  Brent Bulla, MD [facility] [phone]

## 2020-11-08 ENCOUNTER — Other Ambulatory Visit: Payer: Self-pay

## 2020-11-08 LAB — CBC WITH DIFFERENTIAL/PLATELET
Basophils Absolute: 0 10*3/uL (ref 0.0–0.2)
Basos: 0 %
EOS (ABSOLUTE): 0.2 10*3/uL (ref 0.0–0.4)
Eos: 2 %
Hematocrit: 40.6 % (ref 37.5–51.0)
Hemoglobin: 14.1 g/dL (ref 13.0–17.7)
Immature Grans (Abs): 0 10*3/uL (ref 0.0–0.1)
Immature Granulocytes: 0 %
Lymphocytes Absolute: 1.3 10*3/uL (ref 0.7–3.1)
Lymphs: 16 %
MCH: 34.2 pg — ABNORMAL HIGH (ref 26.6–33.0)
MCHC: 34.7 g/dL (ref 31.5–35.7)
MCV: 99 fL — ABNORMAL HIGH (ref 79–97)
Monocytes Absolute: 0.8 10*3/uL (ref 0.1–0.9)
Monocytes: 10 %
Neutrophils Absolute: 6 10*3/uL (ref 1.4–7.0)
Neutrophils: 72 %
Platelets: 184 10*3/uL (ref 150–450)
RBC: 4.12 x10E6/uL — ABNORMAL LOW (ref 4.14–5.80)
RDW: 12 % (ref 11.6–15.4)
WBC: 8.4 10*3/uL (ref 3.4–10.8)

## 2020-11-08 LAB — COMPREHENSIVE METABOLIC PANEL
ALT: 19 IU/L (ref 0–44)
AST: 21 IU/L (ref 0–40)
Albumin/Globulin Ratio: 1.6 (ref 1.2–2.2)
Albumin: 4.4 g/dL (ref 3.8–4.9)
Alkaline Phosphatase: 53 IU/L (ref 44–121)
BUN/Creatinine Ratio: 15 (ref 9–20)
BUN: 26 mg/dL — ABNORMAL HIGH (ref 6–24)
Bilirubin Total: 0.7 mg/dL (ref 0.0–1.2)
CO2: 20 mmol/L (ref 20–29)
Calcium: 9.2 mg/dL (ref 8.7–10.2)
Chloride: 100 mmol/L (ref 96–106)
Creatinine, Ser: 1.73 mg/dL — ABNORMAL HIGH (ref 0.76–1.27)
GFR calc Af Amer: 49 mL/min/{1.73_m2} — ABNORMAL LOW (ref >59)
GFR calc non Af Amer: 43 mL/min/{1.73_m2} — ABNORMAL LOW (ref >59)
Globulin, Total: 2.8 g/dL (ref 1.5–4.5)
Glucose: 102 mg/dL — ABNORMAL HIGH (ref 65–99)
Potassium: 5.1 mmol/L (ref 3.5–5.2)
Sodium: 136 mmol/L (ref 134–144)
Total Protein: 7.2 g/dL (ref 6.0–8.5)

## 2020-11-08 NOTE — Progress Notes (Signed)
Large RBC - check b12 and folate levels, glucose 102,  he is dehydrates with BUN 26, all kidney functions high- recheck kidney panel on Thursday. lp

## 2020-11-09 ENCOUNTER — Ambulatory Visit (INDEPENDENT_AMBULATORY_CARE_PROVIDER_SITE_OTHER): Payer: No Typology Code available for payment source | Admitting: Cardiology

## 2020-11-09 ENCOUNTER — Other Ambulatory Visit: Payer: Self-pay

## 2020-11-09 ENCOUNTER — Encounter: Payer: Self-pay | Admitting: Cardiology

## 2020-11-09 ENCOUNTER — Ambulatory Visit (INDEPENDENT_AMBULATORY_CARE_PROVIDER_SITE_OTHER): Payer: No Typology Code available for payment source

## 2020-11-09 ENCOUNTER — Other Ambulatory Visit: Payer: Self-pay | Admitting: Cardiology

## 2020-11-09 VITALS — BP 102/72 | HR 77 | Ht 69.0 in | Wt 217.8 lb

## 2020-11-09 DIAGNOSIS — R42 Dizziness and giddiness: Secondary | ICD-10-CM

## 2020-11-09 DIAGNOSIS — E8881 Metabolic syndrome: Secondary | ICD-10-CM | POA: Diagnosis not present

## 2020-11-09 DIAGNOSIS — R5383 Other fatigue: Secondary | ICD-10-CM

## 2020-11-09 DIAGNOSIS — I10 Essential (primary) hypertension: Secondary | ICD-10-CM | POA: Diagnosis not present

## 2020-11-09 LAB — B12 AND FOLATE PANEL
Folate: >20 ng/mL (ref >3.0)
Vitamin B-12: 417 pg/mL (ref 232–1245)

## 2020-11-09 LAB — SPECIMEN STATUS REPORT

## 2020-11-09 MED ORDER — LOSARTAN POTASSIUM 100 MG PO TABS: 50.0000 mg | ORAL_TABLET | Freq: Every day | ORAL | 3 refills | Status: DC

## 2020-11-09 NOTE — Progress Notes (Signed)
Cardiology Office Note:    Date:  11/09/2020   ID:  Jon Gonzales, DOB [birth year], MRN [id number]  PCP:  [name], MD  Cardiologist:  Garwin Brothers, MD  Electrophysiologist:  None   Referring MD: [name],*   " I have been experiencing dizziness and had a syncope episodes recently"  History of Present Illness:    Jon Gonzales is a 60 y.o. male with a hx of coronary artery disease status post coronary artery bypass grafting x3 in 2012 with LIMA to LAD, SVG to diagonal, SVG to OM in January 2021 he had a DES to the obtuse marginal artery, hypertension, transischemic attack.  The patient follows with my partner Dr. Tomie China and was last seen by him in September 2021.  At that time he appeared to be doing well from a cardiovascular standpoint.  Patient called to be seen because over the last several weeks he has had low blood pressure.  But was concerning the patient tells me he has had 2 syncopal episodes recently.  The first episode was not too long ago when he was trying to sit in a chair he flipped back and passed out.  He was out for a brief second.  But some nights ago he tells me he was awake going to the bathroom when he felt lightheaded passed out right in the bed in few seconds he was back up again.  Of note recently he has had low blood pressure they have taken away some of his antihypertensive medication.  He did see his PCP who did some blood work as well as an MRI.  His MRI was normal but his blood work showed elevated creatinine.  He is concerned because not only is he experiencing a syncope episode he has been dizzy.  He also has had some shaking aches and headaches.  He has been able to abort the headaches with medications for the dizziness is persistent and intermittent  The patient is here today with his wife.   Past Medical History:  Diagnosis Date  . Angina pectoris (HCC) 10/26/2019  . Anxiety, generalized 05/22/2016  . Bilateral leg edema  01/29/2020  . CAD (coronary artery disease)    Cath 2013 LAD 95% stenosis, D1 90% stenosis, RCA 20%, Circ 30%.  Previous stent to D1 2012.   EF 60%.    . Class 2 obesity due to excess calories without serious comorbidity in adult 09/11/2016  . Confusional arousals 09/11/2016  . Depression   . Excessive daytime sleepiness 09/11/2016  . Herpes zoster 01/20/2020  . HTN (hypertension)   . Hypercholesterolemia 05/22/2016  . Hyperlipidemia   . Hypertension 05/22/2016  . Hypogonadism in male 05/22/2016  . Low testosterone   . Nephrolithiasis   . OSA (obstructive sleep apnea) 09/11/2016  . OSA on CPAP 01/03/2017  . 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 Paranoid reaction, acute (HCC) 05/22/2016  . PLMD (periodic limb movement disorder) 09/11/2016  . PUD (peptic ulcer disease)   . Screening for prostate cancer 05/22/2016  . Severe episode of recurrent major depressive disorder, without psychotic features (HCC) 09/11/2016  . Snoring 09/11/2016  . TIA (transient ischemic attack)     Past Surgical History:  Procedure Laterality Date  . CHOLECYSTECTOMY    . CORONARY ARTERY BYPASS GRAFT  02/19/12   L - LAD, SVG - D1, SVG - OM  . CORONARY STENT INTERVENTION N/A 11/05/2019   Procedure: CORONARY STENT INTERVENTION;  Surgeon: Nanetta Batty  J, MD;  Location: [facility];  Service: Cardiovascular;  Laterality: N/A;  SVG to DIAG  . LEFT HEART CATH AND CORS/GRAFTS ANGIOGRAPHY N/A 11/05/2019   Procedure: LEFT HEART CATH AND CORS/GRAFTS ANGIOGRAPHY;  Surgeon: Runell Gess, MD;  Location: [facility];  Service: Cardiovascular;  Laterality: N/A;  . RENAL ANGIOGRAPHY N/A 11/05/2019   Procedure: RENAL ANGIOGRAPHY;  Surgeon: Runell Gess, MD;  Location: [facility];  Service: Cardiovascular;  Laterality: N/A;  . TONSILLECTOMY AND ADENOIDECTOMY    . WRIST SURGERY      Current Medications: Current Meds  Medication Sig  . acetaminophen (TYLENOL) 500 MG tablet Take  1,000 mg by mouth every 6 (six) hours as needed (for pain.).  Marland Kitchen aspirin EC 81 MG tablet Take 1 tablet (81 mg total) by mouth daily.  . clopidogrel (PLAVIX) 75 MG tablet Take 1 tablet (75 mg total) by mouth daily.  . DULoxetine (CYMBALTA) 60 MG capsule TAKE 1 CAPSULE BY MOUTH TWICE DAILY  . Eszopiclone 3 MG TABS Take 1 tablet (3 mg total) by mouth at bedtime. Take immediately before bedtime  . fexofenadine (ALLEGRA) 60 MG tablet Take 60 mg by mouth daily.  . hydrocortisone-pramoxine (PROCTOFOAM HC) rectal foam Place 1 applicator rectally 2 (two) times daily.  . lansoprazole (PREVACID) 30 MG capsule Take 1 capsule (30 mg total) by mouth daily at 12 noon.  . lithium carbonate (LITHOBID) 300 MG CR tablet Take 1 tablet (300 mg total) by mouth 2 (two) times daily.  Marland Kitchen LORazepam (ATIVAN) 1 MG tablet Take 1 tablet (1 mg total) by mouth in the morning, at noon, and at bedtime.  Marland Kitchen losartan (COZAAR) 100 MG tablet Take 1 tablet (100 mg total) by mouth daily.  . metoprolol succinate (TOPROL-XL) 100 MG 24 hr tablet Take 1 tablet (100 mg total) by mouth daily.  . Multiple Vitamin (MULTIVITAMIN WITH MINERALS) TABS tablet Take 1 tablet by mouth daily. Men's Multivitamin  . nitroGLYCERIN (NITROSTAT) 0.4 MG SL tablet Place 0.4 mg under the tongue every 5 (five) minutes x 3 doses as needed for chest pain.   . predniSONE (DELTASONE) 20 MG tablet Take 1 tablet (20 mg total) by mouth 3 (three) times daily.  . ranolazine (RANEXA) 1000 MG SR tablet Take 1 tablet (1,000 mg total) by mouth 2 (two) times daily.  . rosuvastatin (CRESTOR) 20 MG tablet Take 1 tablet (20 mg total) by mouth at bedtime.  . Saw Palmetto 450 MG CAPS Take 450 mg by mouth 2 (two) times daily.     Allergies:   Trintellix [vortioxetine] and Penicillins   Social History   Socioeconomic History  . Marital status: Married    Spouse name: Not on file  . Number of children: Not on file  . Years of education: Not on file  . Highest education level:  Bachelor's degree (e.g., BA, AB, BS)  Occupational History  . Not on file  Tobacco Use  . Smoking status: Never Smoker  . Smokeless tobacco: Never Used  Vaping Use  . Vaping Use: Never used  Substance and Sexual Activity  . Alcohol use: Yes    Alcohol/week: 6.0 standard drinks    Types: 6 Cans of beer per week  . Drug use: Not on file  . Sexual activity: Not on file  Other Topics Concern  . 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  Social Determinants of Health   Financial Resource Strain: Not on file  Food Insecurity: Not on file  Transportation Needs: Not on file  Physical Activity: Not on file  Stress: Not on file  Social Connections: Not on file     Family History: The patient's family history includes Aneurysm (age of onset: 52) in his mother; CAD (age of onset: 42) in his father; Healthy in his brother; Heart disease in his sister.  ROS:   Review of Systems  Constitution: Reports dizziness.  Negative for decreased appetite, fever and weight gain.  HENT: Negative for congestion, ear discharge, hoarse voice and sore throat.   Eyes: Negative for discharge, redness, vision loss in right eye and visual halos.  Cardiovascular: Recent syncope episode.  Negative for chest pain, dyspnea on exertion, leg swelling, orthopnea and palpitations.  Respiratory: Negative for cough, hemoptysis, shortness of breath and snoring.   Endocrine: Negative for heat intolerance and polyphagia.  Hematologic/Lymphatic: Negative for bleeding problem. Does not bruise/bleed easily.  Skin: Negative for flushing, nail changes, rash and suspicious lesions.  Musculoskeletal: Negative for arthritis, joint pain, muscle cramps, myalgias, neck pain and stiffness.  Gastrointestinal: Negative for abdominal pain, bowel incontinence, diarrhea and excessive appetite.  Genitourinary: Negative for decreased libido, genital sores and  incomplete emptying.  Neurological: Negative for brief paralysis, focal weakness, headaches and loss of balance.  Psychiatric/Behavioral: Negative for altered mental status, depression and suicidal ideas.  Allergic/Immunologic: Negative for HIV exposure and persistent infections.    EKGs/Labs/Other Studies Reviewed:    The following studies were reviewed today:   EKG:  The ekg ordered today demonstrates sinus rhythm, heart rate 77 bpm with nonspecific ST changes which does not appear to be new slightly prolonged QT and nonspecific interventricular conduction defect.  Compared to EKG done in January 2021 in April 2021 no significant change.  Recent Labs: 10/25/2020: TSH 2.270 11/07/2020: ALT 19; BUN 26; Creatinine, Ser 1.73; Hemoglobin 14.1; Platelets 184; Potassium 5.1; Sodium 136  Recent Lipid Panel    Component Value Date/Time   CHOL 145 06/07/2020 0808   TRIG 119 06/07/2020 0808   HDL 55 06/07/2020 0808   CHOLHDL 2.6 06/07/2020 0808   LDLCALC 69 06/07/2020 0808    Physical Exam:    VS:  BP 102/72   Pulse 77   Ht 5\' 9"  (1.753 m)   Wt 217 lb 12.8 oz (98.8 kg)   SpO2 97%   BMI 32.16 kg/m     Wt Readings from Last 3 Encounters:  11/09/20 217 lb 12.8 oz (98.8 kg)  08/23/20 218 lb (98.9 kg)  07/25/20 225 lb (102.1 kg)     GEN: Well nourished, well developed in no acute distress HEENT: Normal NECK: No JVD; No carotid bruits LYMPHATICS: No lymphadenopathy CARDIAC: S1S2 noted,RRR, no murmurs, rubs, gallops RESPIRATORY:  Clear to auscultation without rales, wheezing or rhonchi  ABDOMEN: Soft, non-tender, non-distended, +bowel sounds, no guarding. EXTREMITIES: No edema, No cyanosis, no clubbing MUSCULOSKELETAL:  No deformity  SKIN: Warm and dry NEUROLOGIC:  Alert and oriented x 3, non-focal PSYCHIATRIC:  Normal affect, good insight  ASSESSMENT:    No diagnosis found. PLAN:      I would like to rule out a cardiovascular etiology of this dizziness therefore at this  time I would like to placed a zio patch for 14 days.   In additon a transthoracic echocardiogram will be ordered to re assess LV/RV function and any structural abnormalities.   Blood work will be done today for  BMP, mag, lithium level here also for metabolic syndrome with checks vitamin D level as well as B12.   I was able to do his blood pressure in the office today he was on the lower side we will cut back on his losartan to 50 mg daily.  Should his creatinine increased to greater than 1.7 I would recommend stopping his losartan until his creatinine resolved.  The patient is in agreement with the above plan. The patient left the office in stable condition.  The patient will follow up in   Medication Adjustments/Labs and Tests Ordered: Current medicines are reviewed at length with the patient today.  Concerns regarding medicines are outlined above.  No orders of the defined types were placed in this encounter.  No orders of the defined types were placed in this encounter.   There are no Patient Instructions on file for this visit.   Adopting a Healthy Lifestyle.  Know what a healthy weight is for you (roughly BMI <25) and aim to maintain this   Aim for 7+ servings of fruits and vegetables daily   65-80+ fluid ounces of water or unsweet tea for healthy kidneys   Limit to max 1 drink of alcohol per day; avoid smoking/tobacco   Limit animal fats in diet for cholesterol and heart health - choose grass fed whenever available   Avoid highly processed foods, and foods high in saturated/trans fats   Aim for low stress - take time to unwind and care for your mental health   Aim for 150 min of moderate intensity exercise weekly for heart health, and weights twice weekly for bone health   Aim for 7-9 hours of sleep daily   When it comes to diets, agreement about the perfect plan isnt easy to find, even among the experts. Experts at the [facility] [employer] developed an idea  known as the Healthy Eating Plate. Just imagine a plate divided into logical, healthy portions.   The emphasis is on diet quality:   Load up on vegetables and fruits - one-half of your plate: Aim for color and variety, and remember that potatoes dont count.   Go for whole grains - one-quarter of your plate: Whole wheat, barley, wheat berries, quinoa, oats, brown rice, and foods made with them. If you want pasta, go with whole wheat pasta.   Protein power - one-quarter of your plate: Fish, chicken, beans, and nuts are all healthy, versatile protein sources. Limit red meat.   The diet, however, does go beyond the plate, offering a few other suggestions.   Use healthy plant oils, such as olive, canola, soy, corn, sunflower and peanut. Check the labels, and avoid partially hydrogenated oil, which have unhealthy trans fats.   If youre thirsty, drink water. Coffee and tea are good in moderation, but skip sugary drinks and limit milk and dairy products to one or two daily servings.   The type of carbohydrate in the diet is more important than the amount. Some sources of carbohydrates, such as vegetables, fruits, whole grains, and beans-are healthier than others.   Finally, stay active  Signed, Thomasene Ripple, DO  11/09/2020 4:03 PM     [facility]

## 2020-11-09 NOTE — Patient Instructions (Signed)
Medication Instructions:  Your physician has recommended you make the following change in your medication:  DECREASE: Losartan 50mg  daily. Take half a tablet by mouth daily.  *If you need a refill on your cardiac medications before your next appointment, please call your pharmacy*   Lab Work: Your physician recommends that you return for lab work in:  BMET, MAG, LITHIUM, VIT D, VIT B12   If you have labs (blood work) drawn today and your tests are completely normal, you will receive your results only by: MyChart Message (if you have MyChart) OR . A paper copy in the mail If you have any lab test that is abnormal or we need to change your treatment, we will call you to review the results.   Testing/Procedures: None   Follow-Up: At [facility], you and your health needs are our priority.  As part of our continuing mission to provide you with exceptional heart care, we have created designated Provider Care Teams.  These Care Teams include your primary Cardiologist (physician) and Advanced Practice Providers (APPs -  Physician Assistants and Nurse Practitioners) who all work together to provide you with the care you need, when you need it.  We recommend signing up for the patient portal called "MyChart".  Sign up information is provided on this After Visit Summary.  MyChart is used to connect with patients for Virtual Visits (Telemedicine).  Patients are able to view lab/test results, encounter notes, upcoming appointments, etc.  Non-urgent messages can be sent to your provider as well.   To learn more about what you can do with MyChart, go to [facility].     The format for your next appointment:   In Person  Provider:   [email or web address], MD   Other Instructions:  Echocardiogram An echocardiogram is a test that uses sound waves (ultrasound) to produce images of the heart. Images from an echocardiogram can provide important information about:  Heart size and  shape.  The size and thickness and movement of your heart's walls.  Heart muscle function and strength.  Heart valve function or if you have stenosis. Stenosis is when the heart valves are too narrow.  If blood is flowing backward through the heart valves (regurgitation).  A tumor or infectious growth around the heart valves.  Areas of heart muscle that are not working well because of poor blood flow or injury from a heart attack.  Aneurysm detection. An aneurysm is a weak or damaged part of an artery wall. The wall bulges out from the normal force of blood pumping through the body. Tell a health care provider about:  Any allergies you have.  All medicines you are taking, including vitamins, herbs, eye drops, creams, and over-the-counter medicines.  Any blood disorders you have.  Any surgeries you have had.  Any medical conditions you have.  Whether you are pregnant or may be pregnant. What are the risks? Generally, this is a safe test. However, problems may occur, including an allergic reaction to dye (contrast) that may be used during the test. What happens before the test? No specific preparation is needed. You may eat and drink normally. What happens during the test?  You will take off your clothes from the waist up and put on a hospital gown.  Electrodes or electrocardiogram (ECG)patches may be placed on your chest. The electrodes or patches are then connected to a device that monitors your heart rate and rhythm.  You will lie down on a table for an ultrasound  exam. A gel will be applied to your chest to help sound waves pass through your skin.  A handheld device, called a transducer, will be pressed against your chest and moved over your heart. The transducer produces sound waves that travel to your heart and bounce back (or "echo" back) to the transducer. These sound waves will be captured in real-time and changed into images of your heart that can be viewed on a video  monitor. The images will be recorded on a computer and reviewed by your health care provider.  You may be asked to change positions or hold your breath for a short time. This makes it easier to get different views or better views of your heart.  In some cases, you may receive contrast through an IV in one of your veins. This can improve the quality of the pictures from your heart. The procedure may vary among health care providers and hospitals.   What can I expect after the test? You may return to your normal, everyday life, including diet, activities, and medicines, unless your health care provider tells you not to do that. Follow these instructions at home:  It is up to you to get the results of your test. Ask your health care provider, or the department that is doing the test, when your results will be ready.  Keep all follow-up visits. This is important. Summary  An echocardiogram is a test that uses sound waves (ultrasound) to produce images of the heart.  Images from an echocardiogram can provide important information about the size and shape of your heart, heart muscle function, heart valve function, and other possible heart problems.  You do not need to do anything to prepare before this test. You may eat and drink normally.  After the echocardiogram is completed, you may return to your normal, everyday life, unless your health care provider tells you not to do that. This information is not intended to replace advice given to you by your health care provider. Make sure you discuss any questions you have with your health care provider. Document Revised: 05/24/2020 Document Reviewed: 05/24/2020 Elsevier Patient Education  2021 [employer].

## 2020-11-10 ENCOUNTER — Other Ambulatory Visit: Payer: Self-pay | Admitting: Legal Medicine

## 2020-11-10 DIAGNOSIS — K279 Peptic ulcer, site unspecified, unspecified as acute or chronic, without hemorrhage or perforation: Secondary | ICD-10-CM

## 2020-11-10 MED ORDER — LANSOPRAZOLE 30 MG PO CPDR: 30.0000 mg | DELAYED_RELEASE_CAPSULE | Freq: Every day | ORAL | 2 refills | Status: DC

## 2020-11-16 ENCOUNTER — Other Ambulatory Visit: Payer: Self-pay | Admitting: Family Medicine

## 2020-11-16 LAB — VITAMIN B12: Vitamin B-12: 422 pg/mL (ref 232–1245)

## 2020-11-16 LAB — BASIC METABOLIC PANEL
BUN/Creatinine Ratio: 13 (ref 9–20)
BUN: 16 mg/dL (ref 6–24)
CO2: 21 mmol/L (ref 20–29)
Calcium: 8.9 mg/dL (ref 8.7–10.2)
Chloride: 107 mmol/L — ABNORMAL HIGH (ref 96–106)
Creatinine, Ser: 1.19 mg/dL (ref 0.76–1.27)
GFR calc Af Amer: 77 mL/min/{1.73_m2} (ref >59)
GFR calc non Af Amer: 67 mL/min/{1.73_m2} (ref >59)
Glucose: 110 mg/dL — ABNORMAL HIGH (ref 65–99)
Potassium: 4.3 mmol/L (ref 3.5–5.2)
Sodium: 143 mmol/L (ref 134–144)

## 2020-11-16 LAB — MAGNESIUM: Magnesium: 1.9 mg/dL (ref 1.6–2.3)

## 2020-11-16 LAB — VITAMIN D 25 HYDROXY (VIT D DEFICIENCY, FRACTURES): Vit D, 25-Hydroxy: 32.2 ng/mL (ref 30.0–100.0)

## 2020-11-16 LAB — LITHIUM LEVEL: Lithium Lvl: 0.6 mmol/L (ref 0.5–1.2)

## 2020-11-17 ENCOUNTER — Other Ambulatory Visit: Payer: Self-pay | Admitting: Legal Medicine

## 2020-11-17 DIAGNOSIS — E7800 Pure hypercholesterolemia, unspecified: Secondary | ICD-10-CM

## 2020-11-17 MED ORDER — ROSUVASTATIN CALCIUM 20 MG PO TABS: 20.0000 mg | ORAL_TABLET | Freq: Every day | ORAL | 2 refills | Status: DC

## 2020-11-17 MED FILL — ROSUVASTATIN CALCIUM 20 MG: 20 | 90 days supply | Qty: 90 | Fill #0

## 2020-11-23 ENCOUNTER — Other Ambulatory Visit: Payer: Self-pay

## 2020-11-23 ENCOUNTER — Encounter: Payer: Self-pay | Admitting: Legal Medicine

## 2020-11-23 ENCOUNTER — Ambulatory Visit (INDEPENDENT_AMBULATORY_CARE_PROVIDER_SITE_OTHER): Payer: No Typology Code available for payment source | Admitting: Legal Medicine

## 2020-11-23 VITALS — BP 128/84 | HR 66 | Temp 97.4°F | Ht 70.0 in | Wt 215.0 lb

## 2020-11-23 DIAGNOSIS — L0231 Cutaneous abscess of buttock: Secondary | ICD-10-CM | POA: Insufficient documentation

## 2020-11-23 HISTORY — DX: Cutaneous abscess of buttock: L02.31

## 2020-11-23 MED ORDER — CEPHALEXIN 500 MG PO CAPS: 500.0000 mg | ORAL_CAPSULE | Freq: Two times a day (BID) | ORAL | 0 refills | Status: DC

## 2020-11-23 NOTE — Progress Notes (Signed)
Acute Office Visit  Subjective:    Patient ID: Jon Gonzales, male    DOB: [birth year], 60 y.o.   MRN: [id number]  Chief Complaint  Patient presents with  . Abscess    HPI Patient is in today for abscess left buttock for 5 days .  It is 2cm and painful.  No pointing or drainage.  No fever or chills  Past Medical History:  Diagnosis Date  . Angina pectoris (HCC) 10/26/2019  . Anxiety, generalized 05/22/2016  . Bilateral leg edema 01/29/2020  . CAD (coronary artery disease)    Cath 2013 LAD 95% stenosis, D1 90% stenosis, RCA 20%, Circ 30%.  Previous stent to D1 2012.   EF 60%.    . Class 2 obesity due to excess calories without serious comorbidity in adult 09/11/2016  . Confusional arousals 09/11/2016  . Depression   . Excessive daytime sleepiness 09/11/2016  . Herpes zoster 01/20/2020  . HTN (hypertension)   . Hypercholesterolemia 05/22/2016  . Hyperlipidemia   . Hypertension 05/22/2016  . Hypogonadism in male 05/22/2016  . Low testosterone   . Nephrolithiasis   . OSA (obstructive sleep apnea) 09/11/2016  . OSA on CPAP 01/03/2017  . 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 Paranoid reaction, acute (HCC) 05/22/2016  . PLMD (periodic limb movement disorder) 09/11/2016  . PUD (peptic ulcer disease)   . Screening for prostate cancer 05/22/2016  . Severe episode of recurrent major depressive disorder, without psychotic features (HCC) 09/11/2016  . Snoring 09/11/2016  . TIA (transient ischemic attack)     Past Surgical History:  Procedure Laterality Date  . CHOLECYSTECTOMY    . CORONARY ARTERY BYPASS GRAFT  02/19/12   L - LAD, SVG - D1, SVG - OM  . CORONARY STENT INTERVENTION N/A 11/05/2019   Procedure: CORONARY STENT INTERVENTION;  Surgeon: Runell Gess, MD;  Location: [facility];  Service: Cardiovascular;  Laterality: N/A;  SVG to DIAG  . LEFT HEART CATH AND CORS/GRAFTS ANGIOGRAPHY N/A 11/05/2019   Procedure: LEFT HEART CATH AND CORS/GRAFTS ANGIOGRAPHY;   Surgeon: Runell Gess, MD;  Location: [facility];  Service: Cardiovascular;  Laterality: N/A;  . RENAL ANGIOGRAPHY N/A 11/05/2019   Procedure: RENAL ANGIOGRAPHY;  Surgeon: Runell Gess, MD;  Location: [facility];  Service: Cardiovascular;  Laterality: N/A;  . TONSILLECTOMY AND ADENOIDECTOMY    . WRIST SURGERY      Family History  Problem Relation Age of Onset  . CAD Father 87  . Aneurysm Mother 46       cerebral  . Heart disease Sister        Unclear  . Healthy Brother     Social History   Socioeconomic History  . Marital status: Married    Spouse name: Not on file  . Number of children: Not on file  . Years of education: Not on file  . Highest education level: Bachelor's degree (e.g., BA, AB, BS)  Occupational History  . Not on file  Tobacco Use  . Smoking status: Never Smoker  . Smokeless tobacco: Never Used  Vaping Use  . Vaping Use: Never used  Substance and Sexual Activity  . Alcohol use: Yes    Alcohol/week: 6.0 standard drinks    Types: 6 Cans of beer per week  . Drug use: Not on file  . Sexual activity: Not on file  Other Topics Concern  . 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     Social Determinants of Health   Financial Resource Strain: Not on file  Food Insecurity: Not on file  Transportation Needs: Not on file  Physical Activity: Not on file  Stress: Not on file  Social Connections: Not on file  Intimate Partner Violence: Not on file    Outpatient Medications Prior to Visit  Medication Sig Dispense Refill  . acetaminophen (TYLENOL) 500 MG tablet Take 1,000 mg by mouth every 6 (six) hours as needed (for pain.).    Marland Kitchen aspirin EC 81 MG tablet Take 1 tablet (81 mg total) by mouth daily. 30 tablet 0  . clopidogrel (PLAVIX) 75 MG tablet Take 1 tablet (75 mg total) by mouth daily. 90 tablet 2  . DULoxetine (CYMBALTA) 60 MG capsule TAKE 1 CAPSULE BY MOUTH  TWICE DAILY 180 capsule 2  . Eszopiclone 3 MG TABS Take 1 tablet (3 mg total) by mouth at bedtime. Take immediately before bedtime 90 tablet 2  . fexofenadine (ALLEGRA) 60 MG tablet Take 60 mg by mouth daily.    . lansoprazole (PREVACID) 30 MG capsule Take 1 capsule (30 mg total) by mouth daily at 12 noon. 90 capsule 2  . lithium carbonate (LITHOBID) 300 MG CR tablet Take 1 tablet (300 mg total) by mouth 2 (two) times daily. 180 tablet 2  . LORazepam (ATIVAN) 1 MG tablet Take 1 tablet (1 mg total) by mouth in the morning, at noon, and at bedtime. 180 tablet 2  . losartan (COZAAR) 100 MG tablet Take 0.5 tablets (50 mg total) by mouth daily. 90 tablet 3  . metoprolol succinate (TOPROL-XL) 100 MG 24 hr tablet Take 1 tablet (100 mg total) by mouth daily. 90 tablet 2  . Multiple Vitamin (MULTIVITAMIN WITH MINERALS) TABS tablet Take 1 tablet by mouth daily. Men's Multivitamin    . nitroGLYCERIN (NITROSTAT) 0.4 MG SL tablet Place 0.4 mg under the tongue every 5 (five) minutes x 3 doses as needed for chest pain.     . ranolazine (RANEXA) 1000 MG SR tablet Take 1 tablet (1,000 mg total) by mouth 2 (two) times daily. 180 tablet 3  . rosuvastatin (CRESTOR) 20 MG tablet Take 1 tablet (20 mg total) by mouth at bedtime. 90 tablet 2   No facility-administered medications prior to visit.    Allergies  Allergen Reactions  . Trintellix [Vortioxetine] Other (See Comments)    Headaches.  . Penicillins Rash    Did it involve swelling of the face/tongue/throat, SOB, or low BP? Unknown Did it involve sudden or severe rash/hives, skin peeling, or any reaction on the inside of your mouth or nose? Unknown Did you need to seek medical attention at a hospital or doctor's office? Unknown When did it last happen?childhood reaction If all above answers are "NO", may proceed with cephalosporin use.     Review of Systems  Constitutional: Negative.   HENT: Negative.   Respiratory: Negative.   Cardiovascular:  Negative.   Gastrointestinal: Negative.   Musculoskeletal: Negative.   Skin: Positive for wound (abscess left buttock).       Objective:    Physical Exam Vitals reviewed.  Constitutional:      Appearance: Normal appearance.  Cardiovascular:     Rate and Rhythm: Normal rate and regular rhythm.     Pulses: Normal pulses.     Heart sounds: Normal heart sounds.  Pulmonary:     Effort: Pulmonary effort  is normal.     Breath sounds: Normal breath sounds.  Skin:    Comments: 2cm abscess left buttock, painful, no drainage  Neurological:     Mental Status: He is alert.     BP 128/84   Pulse 66   Temp (!) 97.4 F (36.3 C)   Ht 5\' 10"  (1.778 m)   Wt 215 lb (97.5 kg)   SpO2 100%   BMI 30.85 kg/m  Wt Readings from Last 3 Encounters:  11/23/20 215 lb (97.5 kg)  11/09/20 217 lb 12.8 oz (98.8 kg)  08/23/20 218 lb (98.9 kg)    Health Maintenance Due  Topic Date Due  . Hepatitis C Screening  Never done  . HIV Screening  Never done  . TETANUS/TDAP  Never done  . COVID-19 Vaccine (2 - Pfizer 3-dose series) 07/19/2020    There are no preventive care reminders to display for this patient.   Lab Results  Component Value Date   TSH 2.270 10/25/2020   Lab Results  Component Value Date   WBC 8.4 11/07/2020   HGB 14.1 11/07/2020   HCT 40.6 11/07/2020   MCV 99 (H) 11/07/2020   PLT 184 11/07/2020   Lab Results  Component Value Date   NA 143 11/15/2020   K 4.3 11/15/2020   CO2 21 11/15/2020   GLUCOSE 110 (H) 11/15/2020   BUN 16 11/15/2020   CREATININE 1.19 11/15/2020   BILITOT 0.7 11/07/2020   ALKPHOS 53 11/07/2020   AST 21 11/07/2020   ALT 19 11/07/2020   PROT 7.2 11/07/2020   ALBUMIN 4.4 11/07/2020   CALCIUM 8.9 11/15/2020   Lab Results  Component Value Date   CHOL 145 06/07/2020   Lab Results  Component Value Date   HDL 55 06/07/2020   Lab Results  Component Value Date   LDLCALC 69 06/07/2020   Lab Results  Component Value Date   TRIG 119 06/07/2020    Lab Results  Component Value Date   CHOLHDL 2.6 06/07/2020   No results found for: HGBA1C     Assessment & Plan:  1. Abscess of buttock - cephALEXin (KEFLEX) 500 MG capsule; Take 1 capsule (500 mg total) by mouth 2 (two) times daily.  Dispense: 20 capsule; Refill: 0 Treat with antibiotics and warm compresses.  Follow up if it stops draining or gets worse   Meds ordered this encounter  Medications  . cephALEXin (KEFLEX) 500 MG capsule    Sig: Take 1 capsule (500 mg total) by mouth 2 (two) times daily.    Dispense:  20 capsule    Refill:  0       Follow-up: Return if symptoms worsen or fail to improve.  An After Visit Summary was printed and given to the patient.  06/09/2020, MD [facility] [phone]

## 2020-11-29 ENCOUNTER — Other Ambulatory Visit: Payer: Self-pay | Admitting: Legal Medicine

## 2020-11-29 MED ORDER — SULFAMETHOXAZOLE-TRIMETHOPRIM 800-160 MG PO TABS: 1.0000 | ORAL_TABLET | Freq: Two times a day (BID) | ORAL | 0 refills | Status: DC

## 2020-12-01 MED FILL — CLOPIDOGREL 75 MG TABLET: 75 | 90 days supply | Qty: 90 | Fill #1

## 2020-12-06 ENCOUNTER — Other Ambulatory Visit: Payer: Self-pay

## 2020-12-06 ENCOUNTER — Ambulatory Visit (INDEPENDENT_AMBULATORY_CARE_PROVIDER_SITE_OTHER): Payer: No Typology Code available for payment source

## 2020-12-06 DIAGNOSIS — R42 Dizziness and giddiness: Secondary | ICD-10-CM | POA: Diagnosis not present

## 2020-12-06 LAB — ECHOCARDIOGRAM COMPLETE
Area-P 1/2: 3.03 cm2
S' Lateral: 3.3 cm

## 2020-12-06 NOTE — Progress Notes (Signed)
Complete echocardiogram performed.  Jimmy Reel RDCS, RVT  

## 2020-12-08 ENCOUNTER — Other Ambulatory Visit: Payer: Self-pay

## 2020-12-09 ENCOUNTER — Encounter: Payer: Self-pay | Admitting: Cardiology

## 2020-12-09 ENCOUNTER — Other Ambulatory Visit: Payer: Self-pay

## 2020-12-09 ENCOUNTER — Ambulatory Visit (INDEPENDENT_AMBULATORY_CARE_PROVIDER_SITE_OTHER): Payer: No Typology Code available for payment source | Admitting: Cardiology

## 2020-12-09 VITALS — BP 120/82 | HR 66 | Ht 70.0 in | Wt 222.2 lb

## 2020-12-09 DIAGNOSIS — I10 Essential (primary) hypertension: Secondary | ICD-10-CM

## 2020-12-09 DIAGNOSIS — G4733 Obstructive sleep apnea (adult) (pediatric): Secondary | ICD-10-CM

## 2020-12-09 DIAGNOSIS — R600 Localized edema: Secondary | ICD-10-CM

## 2020-12-09 DIAGNOSIS — I2510 Atherosclerotic heart disease of native coronary artery without angina pectoris: Secondary | ICD-10-CM

## 2020-12-09 DIAGNOSIS — R0602 Shortness of breath: Secondary | ICD-10-CM | POA: Diagnosis not present

## 2020-12-09 DIAGNOSIS — I25709 Atherosclerosis of coronary artery bypass graft(s), unspecified, with unspecified angina pectoris: Secondary | ICD-10-CM | POA: Insufficient documentation

## 2020-12-09 DIAGNOSIS — G459 Transient cerebral ischemic attack, unspecified: Secondary | ICD-10-CM | POA: Diagnosis not present

## 2020-12-09 DIAGNOSIS — Z9989 Dependence on other enabling machines and devices: Secondary | ICD-10-CM

## 2020-12-09 NOTE — Patient Instructions (Signed)

## 2020-12-09 NOTE — Progress Notes (Signed)
Cardiology Office Note:    Date:  12/09/2020   ID:  Jon GanongWilliam D Gonzales, DOB 03-03-1961, MRN [id number]  PCP:  Abigail MiyamotoPerry, Jon Edward, MD  Cardiologist:  Jon Brothersajan R Revankar, MD  Electrophysiologist:  None   Referring MD: Abigail MiyamotoPerry, Jon Gonzales,*   " I am doing well"   History of Present Illness:    Jon Gonzales is a 60 y.o. male with a hx of coronary artery disease status post coronary artery bypass grafting x3 in 2012 with LIMA to LAD, SVG to diagonal, SVG to OM in January 2021 he had a DES to the obtuse marginal artery, hypertension, transient ischemic attack.  I last the patient on 1/26/20222 at that time he was experiencing some dizziness.  At that time we got some lab work repeated an echocardiogram and place a monitor on the patient.  In the interim he was able to get the monitor done as well as his echocardiogram did not show any abnormalities.  He is here today for follow-up visit.  He tells me that his dizziness has improved.  He is feeling much better.  But now he is experiencing some shortness of breath on exertion.  He does acknowledge that he is deconditioned and needs to also pick up with more exercises.  He is in the office today with his wife.   Past Medical History:  Diagnosis Date  . Abscess of buttock 11/23/2020  . Angina pectoris (HCC) 10/26/2019  . Anxiety, generalized 05/22/2016  . Bilateral leg edema 01/29/2020  . CAD (coronary artery disease)    Cath 2013 LAD 95% stenosis, D1 90% stenosis, RCA 20%, Circ 30%.  Previous stent to D1 2012.   EF 60%.    . Class 2 obesity due to excess calories without serious comorbidity in adult 09/11/2016  . Confusional arousals 09/11/2016  . Dehydration 11/07/2020  . Depression   . Dizziness 10/27/2020  . Excessive daytime sleepiness 09/11/2016  . Herpes zoster 01/20/2020  . HTN (hypertension)   . Hypercholesterolemia 05/22/2016  . Hyperlipidemia   . Hypertension 05/22/2016  . Hypogonadism in male 05/22/2016  . Hypotension 11/07/2020  . Low  testosterone   . Nephrolithiasis   . OSA (obstructive sleep apnea) 09/11/2016  . OSA on CPAP 01/03/2017  . 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 Other fatigue 10/27/2020  . Paranoid reaction, acute (HCC) 05/22/2016  . Paronychia of finger, left 08/22/2020  . PLMD (periodic limb movement disorder) 09/11/2016  . PUD (peptic ulcer disease)   . Screening for prostate cancer 05/22/2016  . Severe episode of recurrent major depressive disorder, without psychotic features (HCC) 09/11/2016  . Snoring 09/11/2016  . TIA (transient ischemic attack)     Past Surgical History:  Procedure Laterality Date  . CHOLECYSTECTOMY    . CORONARY ARTERY BYPASS GRAFT  02/19/12   L - LAD, SVG - D1, SVG - OM  . CORONARY STENT INTERVENTION N/A 11/05/2019   Procedure: CORONARY STENT INTERVENTION;  Surgeon: Runell GessBerry, Jonathan J, MD;  Location: [facility];  Service: Cardiovascular;  Laterality: N/A;  SVG to DIAG  . LEFT HEART CATH AND CORS/GRAFTS ANGIOGRAPHY N/A 11/05/2019   Procedure: LEFT HEART CATH AND CORS/GRAFTS ANGIOGRAPHY;  Surgeon: Runell GessBerry, Jonathan J, MD;  Location: [facility];  Service: Cardiovascular;  Laterality: N/A;  . RENAL ANGIOGRAPHY N/A 11/05/2019   Procedure: RENAL ANGIOGRAPHY;  Surgeon: Runell GessBerry, Jonathan J, MD;  Location: [facility];  Service: Cardiovascular;  Laterality: N/A;  . TONSILLECTOMY AND  ADENOIDECTOMY    . WRIST SURGERY      Current Medications: Current Meds  Medication Sig  . aspirin EC 81 MG tablet Take 1 tablet (81 mg total) by mouth daily.  . clopidogrel (PLAVIX) 75 MG tablet Take 1 tablet (75 mg total) by mouth daily.  . DULoxetine (CYMBALTA) 60 MG capsule TAKE 1 CAPSULE BY MOUTH TWICE DAILY  . Eszopiclone 3 MG TABS Take 1 tablet (3 mg total) by mouth at bedtime. Take immediately before bedtime  . fexofenadine (ALLEGRA) 60 MG tablet Take 60 mg by mouth daily.  . lansoprazole (PREVACID) 30 MG capsule Take 1 capsule (30 mg total) by mouth daily at  12 noon.  . lithium carbonate (LITHOBID) 300 MG CR tablet Take 1 tablet (300 mg total) by mouth 2 (two) times daily.  Marland Kitchen LORazepam (ATIVAN) 1 MG tablet Take 1 tablet (1 mg total) by mouth in the morning, at noon, and at bedtime.  Marland Kitchen losartan (COZAAR) 100 MG tablet Take 0.5 tablets (50 mg total) by mouth daily. (Patient taking differently: Take 50 mg by mouth daily. 100 MG DAILY)  . metoprolol succinate (TOPROL-XL) 100 MG 24 hr tablet Take 1 tablet (100 mg total) by mouth daily.  . Multiple Vitamin (MULTIVITAMIN WITH MINERALS) TABS tablet Take 1 tablet by mouth daily. Men's Multivitamin  . nitroGLYCERIN (NITROSTAT) 0.4 MG SL tablet Place 0.4 mg under the tongue every 5 (five) minutes x 3 doses as needed for chest pain.   . ranolazine (RANEXA) 1000 MG SR tablet Take 1 tablet (1,000 mg total) by mouth 2 (two) times daily.  . rosuvastatin (CRESTOR) 20 MG tablet Take 1 tablet (20 mg total) by mouth at bedtime.     Allergies:   Trintellix [vortioxetine] and Penicillins   Social History   Socioeconomic History  . Marital status: Married    Spouse name: Not on file  . Number of children: Not on file  . Years of education: Not on file  . Highest education level: Bachelor's degree (e.g., BA, AB, BS)  Occupational History  . Not on file  Tobacco Use  . Smoking status: Never Smoker  . Smokeless tobacco: Never Used  Vaping Use  . Vaping Use: Never used  Substance and Sexual Activity  . Alcohol use: Yes    Alcohol/week: 6.0 standard drinks    Types: 6 Cans of beer per week  . Drug use: Not on file  . Sexual activity: Not on file  Other Topics Concern  . 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     Social Determinants of Health   Financial Resource Strain: Not on file  Food Insecurity: Not on file  Transportation Needs: Not on file  Physical Activity: Not on file  Stress: Not on file  Social Connections: Not on  file     Family History: The patient's family history includes Aneurysm (age of onset: 50) in his mother; CAD (age of onset: 32) in his father; Healthy in his brother; Heart disease in his sister.  ROS:   Review of Systems  Constitution: Negative for decreased appetite, fever and weight gain.  HENT: Negative for congestion, ear discharge, hoarse voice and sore throat.   Eyes: Negative for discharge, redness, vision loss in right eye and visual halos.  Cardiovascular: Negative for chest pain, dyspnea on exertion, leg swelling, orthopnea and palpitations.  Respiratory: Negative for cough, hemoptysis, shortness of  breath and snoring.   Endocrine: Negative for heat intolerance and polyphagia.  Hematologic/Lymphatic: Negative for bleeding problem. Does not bruise/bleed easily.  Skin: Negative for flushing, nail changes, rash and suspicious lesions.  Musculoskeletal: Negative for arthritis, joint pain, muscle cramps, myalgias, neck pain and stiffness.  Gastrointestinal: Negative for abdominal pain, bowel incontinence, diarrhea and excessive appetite.  Genitourinary: Negative for decreased libido, genital sores and incomplete emptying.  Neurological: Negative for brief paralysis, focal weakness, headaches and loss of balance.  Psychiatric/Behavioral: Negative for altered mental status, depression and suicidal ideas.  Allergic/Immunologic: Negative for HIV exposure and persistent infections.    EKGs/Labs/Other Studies Reviewed:    The following studies were reviewed today:   EKG: None today  Zio monitor 12/01/2020 The patient wore the monitor for 12 days 3 hours starting November 09, 2020. Indication: Dizziness  The minimum heart rate was 48 bpm, maximum heart rate was 108 bpm, and average heart rate was 68 bpm. Predominant underlying rhythm was Sinus Rhythm.  Premature atrial complexes were rare less than 1%. Premature Ventricular complexes were rare less than 1%.  No no  ventricular tachycardia, no pauses, No AV block, no supraventricular tachycardia and no atrial fibrillation present. No patient triggered events or diary events reported.   Conclusion: Normal/unremarkable study   TTE IMPRESSIONS 11/2020  1. GLS: -16.5. Left ventricular ejection fraction, by estimation, is 60  to 65%. The left ventricle has normal function. The left ventricle has no  regional wall motion abnormalities. Left ventricular diastolic parameters  were normal.  2. Right ventricular systolic function is normal. The right ventricular  size is normal. There is normal pulmonary artery systolic pressure.  3. The mitral valve is normal in structure. Mild mitral valve  regurgitation. No evidence of mitral stenosis.  4. The aortic valve is normal in structure. Aortic valve regurgitation is  not visualized. No aortic stenosis is present.  5. The inferior vena cava is normal in size with greater than 50%  respiratory variability, suggesting right atrial pressure of 3 mmHg.   FINDINGS  Left Ventricle: GLS: -16.5. Left ventricular ejection fraction, by  estimation, is 60 to 65%. The left ventricle has normal function. The left  ventricle has no regional wall motion abnormalities. The left ventricular  internal cavity size was normal in  size. There is borderline left ventricular hypertrophy. Left ventricular  diastolic parameters were normal.   Right Ventricle: The right ventricular size is normal. No increase in  right ventricular wall thickness. Right ventricular systolic function is  normal. There is normal pulmonary artery systolic pressure. The tricuspid  regurgitant velocity is 2.40 m/s, and  with an assumed right atrial pressure of 3 mmHg, the estimated right  ventricular systolic pressure is 26.0 mmHg.   Left Atrium: Left atrial size was normal in size.   Right Atrium: Right atrial size was normal in size.   Pericardium: There is no evidence of pericardial  effusion.   Mitral Valve: The mitral valve is normal in structure. Mild mitral valve  regurgitation. No evidence of mitral valve stenosis.   Tricuspid Valve: The tricuspid valve is normal in structure. Tricuspid  valve regurgitation is mild . No evidence of tricuspid stenosis.   Aortic Valve: The aortic valve is normal in structure. Aortic valve  regurgitation is not visualized. No aortic stenosis is present.   Pulmonic Valve: The pulmonic valve was normal in structure. Pulmonic valve  regurgitation is not visualized. No evidence of pulmonic stenosis.   Aorta: The aortic root is  normal in size and structure.   Venous: The inferior vena cava is normal in size with greater than 50%  respiratory variability, suggesting right atrial pressure of 3 mmHg.   IAS/Shunts: No atrial level shunt detected by color flow Doppler.      Recent Labs: 10/25/2020: TSH 2.270 11/07/2020: ALT 19; Hemoglobin 14.1; Platelets 184 11/15/2020: BUN 16; Creatinine, Ser 1.19; Magnesium 1.9; Potassium 4.3; Sodium 143  Recent Lipid Panel    Component Value Date/Time   CHOL 145 06/07/2020 0808   TRIG 119 06/07/2020 0808   HDL 55 06/07/2020 0808   CHOLHDL 2.6 06/07/2020 0808   LDLCALC 69 06/07/2020 0808    Physical Exam:    VS:  BP 120/82   Pulse 66   Ht 5\' 10"  (1.778 m)   Wt 222 lb 3.2 oz (100.8 kg)   SpO2 97%   BMI 31.88 kg/m     Wt Readings from Last 3 Encounters:  12/09/20 222 lb 3.2 oz (100.8 kg)  11/23/20 215 lb (97.5 kg)  11/09/20 217 lb 12.8 oz (98.8 kg)     GEN: Well nourished, well developed in no acute distress HEENT: Normal NECK: No JVD; No carotid bruits LYMPHATICS: No lymphadenopathy CARDIAC: S1S2 noted,RRR, no murmurs, rubs, gallops RESPIRATORY:  Clear to auscultation without rales, wheezing or rhonchi  ABDOMEN: Soft, non-tender, non-distended, +bowel sounds, no guarding. EXTREMITIES: No edema, No cyanosis, no clubbing MUSCULOSKELETAL:  No deformity  SKIN: Warm and  dry NEUROLOGIC:  Alert and oriented x 3, non-focal PSYCHIATRIC:  Normal affect, good insight  ASSESSMENT:    1. Shortness of breath   2. CAD in native artery   3. Primary hypertension   4. TIA (transient ischemic attack)   5. OSA on CPAP   6. Bilateral leg edema    PLAN:     1. His shortness of breath is likely due to deconditioning. We have discussed an he will increased his exercise hopefully which will improve this. His previous angina symptom has been chest pain. Certainly I will keep a close monitoring on th patient as he also does have CAD. If no improvement we will move onto an ischemic evaluation.   Bilateral +1 edema, he will try compression stocking and also has HCTZ at home that he can use cautiously as needed, we will continue to monitor.   The patient understands the need to lose weight with diet and exercise. We have discussed specific strategies for this.  Continue with cpap.  The patient is in agreement with the above plan. The patient left the office in stable condition.  The patient will follow up in   Medication Adjustments/Labs and Tests Ordered: Current medicines are reviewed at length with the patient today.  Concerns regarding medicines are outlined above.  No orders of the defined types were placed in this encounter.  No orders of the defined types were placed in this encounter.   Patient Instructions  Medication Instructions:  Your physician recommends that you continue on your current medications as directed. Please refer to the Current Medication list given to you today.  *If you need a refill on your cardiac medications before your next appointment, please call your pharmacy*   Lab Work: None If you have labs (blood work) drawn today and your tests are completely normal, you will receive your results only by: 11/11/20 MyChart Message (if you have MyChart) OR . A paper copy in the mail If you have any lab test that is abnormal or we need to  change  your treatment, we will call you to review the results.   Testing/Procedures: None   Follow-Up: At [facility], you and your health needs are our priority.  As part of our continuing mission to provide you with exceptional heart care, we have created designated Provider Care Teams.  These Care Teams include your primary Cardiologist (physician) and Advanced Practice Providers (APPs -  Physician Assistants and Nurse Practitioners) who all work together to provide you with the care you need, when you need it.  We recommend signing up for the patient portal called "MyChart".  Sign up information is provided on this After Visit Summary.  MyChart is used to connect with patients for Virtual Visits (Telemedicine).  Patients are able to view lab/test results, encounter notes, upcoming appointments, etc.  Non-urgent messages can be sent to your provider as well.   To learn more about what you can do with MyChart, go to [email or web address].    Your next appointment:   4 month(s)  The format for your next appointment:   In Person  Provider:   Thomasene Ripple, DO   Other Instructions      Adopting a Healthy Lifestyle.  Know what a healthy weight is for you (roughly BMI <25) and aim to maintain this   Aim for 7+ servings of fruits and vegetables daily   65-80+ fluid ounces of water or unsweet tea for healthy kidneys   Limit to max 1 drink of alcohol per day; avoid smoking/tobacco   Limit animal fats in diet for cholesterol and heart health - choose grass fed whenever available   Avoid highly processed foods, and foods high in saturated/trans fats   Aim for low stress - take time to unwind and care for your mental health   Aim for 150 min of moderate intensity exercise weekly for heart health, and weights twice weekly for bone health   Aim for 7-9 hours of sleep daily   When it comes to diets, agreement about the perfect plan isnt easy to find, even among the experts. Experts  at the [facility] of [employer] developed an idea known as the Healthy Eating Plate. Just imagine a plate divided into logical, healthy portions.   The emphasis is on diet quality:   Load up on vegetables and fruits - one-half of your plate: Aim for color and variety, and remember that potatoes dont count.   Go for whole grains - one-quarter of your plate: Whole wheat, barley, wheat berries, quinoa, oats, brown rice, and foods made with them. If you want pasta, go with whole wheat pasta.   Protein power - one-quarter of your plate: Fish, chicken, beans, and nuts are all healthy, versatile protein sources. Limit red meat.   The diet, however, does go beyond the plate, offering a few other suggestions.   Use healthy plant oils, such as olive, canola, soy, corn, sunflower and peanut. Check the labels, and avoid partially hydrogenated oil, which have unhealthy trans fats.   If youre thirsty, drink water. Coffee and tea are good in moderation, but skip sugary drinks and limit milk and dairy products to one or two daily servings.   The type of carbohydrate in the diet is more important than the amount. Some sources of carbohydrates, such as vegetables, fruits, whole grains, and beans-are healthier than others.   Finally, stay active  Signed, Thomasene Ripple, DO  12/09/2020 7:47 PM    [facility]

## 2020-12-19 MED FILL — ESZOPICLONE 3 MG TABS: 3 | 90 days supply | Qty: 90 | Fill #1

## 2020-12-22 ENCOUNTER — Ambulatory Visit: Payer: No Typology Code available for payment source | Admitting: Legal Medicine

## 2021-01-06 ENCOUNTER — Other Ambulatory Visit (HOSPITAL_BASED_OUTPATIENT_CLINIC_OR_DEPARTMENT_OTHER): Payer: Self-pay

## 2021-01-09 ENCOUNTER — Ambulatory Visit: Payer: No Typology Code available for payment source | Admitting: Cardiology

## 2021-01-26 ENCOUNTER — Other Ambulatory Visit: Payer: Self-pay | Admitting: Physician Assistant

## 2021-01-26 MED ORDER — FLUCONAZOLE 150 MG PO TABS: ORAL_TABLET | ORAL | 0 refills | Status: DC

## 2021-01-28 ENCOUNTER — Other Ambulatory Visit (HOSPITAL_COMMUNITY): Payer: Self-pay

## 2021-01-28 ENCOUNTER — Other Ambulatory Visit: Payer: Self-pay | Admitting: Legal Medicine

## 2021-01-28 DIAGNOSIS — F411 Generalized anxiety disorder: Secondary | ICD-10-CM

## 2021-01-28 DIAGNOSIS — F319 Bipolar disorder, unspecified: Secondary | ICD-10-CM

## 2021-01-28 MED ORDER — LORAZEPAM 1 MG PO TABS
ORAL_TABLET | ORAL | 2 refills | Status: DC
  Filled 2021-01-28: qty 180, 60d supply, fill #0
  Filled 2021-04-16: qty 180, 60d supply, fill #1
  Filled 2021-07-09: qty 180, 60d supply, fill #2

## 2021-01-28 MED ORDER — LITHIUM CARBONATE ER 300 MG PO TBCR
EXTENDED_RELEASE_TABLET | Freq: Two times a day (BID) | ORAL | 2 refills | Status: DC
  Filled 2021-01-28: qty 180, 90d supply, fill #0
  Filled 2021-04-25: qty 180, 90d supply, fill #1

## 2021-01-28 MED FILL — Metoprolol Succinate Tab ER 24HR 100 MG (Tartrate Equiv): ORAL | 90 days supply | Qty: 90 | Fill #0 | Status: AC

## 2021-01-28 MED FILL — Duloxetine HCl Enteric Coated Pellets Cap 60 MG (Base Eq): ORAL | 90 days supply | Qty: 180 | Fill #0 | Status: AC

## 2021-01-30 ENCOUNTER — Ambulatory Visit (INDEPENDENT_AMBULATORY_CARE_PROVIDER_SITE_OTHER): Payer: No Typology Code available for payment source | Admitting: Legal Medicine

## 2021-01-30 ENCOUNTER — Other Ambulatory Visit (HOSPITAL_COMMUNITY): Payer: Self-pay

## 2021-01-30 ENCOUNTER — Encounter: Payer: Self-pay | Admitting: Legal Medicine

## 2021-01-30 ENCOUNTER — Other Ambulatory Visit: Payer: Self-pay

## 2021-01-30 VITALS — BP 172/98 | HR 72 | Resp 20 | Wt 226.0 lb

## 2021-01-30 DIAGNOSIS — R519 Headache, unspecified: Secondary | ICD-10-CM

## 2021-01-30 DIAGNOSIS — I10 Essential (primary) hypertension: Secondary | ICD-10-CM | POA: Diagnosis not present

## 2021-01-30 DIAGNOSIS — G8929 Other chronic pain: Secondary | ICD-10-CM | POA: Diagnosis not present

## 2021-01-30 DIAGNOSIS — I209 Angina pectoris, unspecified: Secondary | ICD-10-CM

## 2021-01-30 HISTORY — DX: Other chronic pain: G89.29

## 2021-01-30 HISTORY — DX: Headache, unspecified: R51.9

## 2021-01-30 MED ORDER — HYDROCHLOROTHIAZIDE 25 MG PO TABS
25.0000 mg | ORAL_TABLET | Freq: Every day | ORAL | 2 refills | Status: DC
  Filled 2021-01-30: qty 90, 90d supply, fill #0
  Filled 2021-05-27 – 2021-06-10 (×2): qty 90, 90d supply, fill #1
  Filled 2021-08-31: qty 90, 90d supply, fill #2

## 2021-01-30 NOTE — Progress Notes (Signed)
Subjective:  Patient ID: Jon Gonzales, male    DOB: 06-Jan-1961  Age: 60 y.o. MRN: [id number]  Chief Complaint  Patient presents with  . Hypertension  . Headache    HPI: patient has headache for 2 weeks, swelling in legs, BP up. We removed several bp medicines In past due to hypotension. No chest pain.  occurs at rest. He is under some stress with wife ill. BP was 172/90   Current Outpatient Medications on File Prior to Visit  Medication Sig Dispense Refill  . aspirin EC 81 MG tablet Take 1 tablet (81 mg total) by mouth daily. 30 tablet 0  . clopidogrel (PLAVIX) 75 MG tablet TAKE 1 TABLET BY MOUTH ONCE DAILY 90 tablet 2  . DULoxetine (CYMBALTA) 60 MG capsule TAKE 1 CAPSULE BY MOUTH TWICE DAILY 180 capsule 2  . DULoxetine (CYMBALTA) 60 MG capsule TAKE 1 CAPSULE BY MOUTH 2 TIMES DAILY 180 capsule 2  . Eszopiclone 3 MG TABS TAKE 1 TABLET BY MOUTH IMMEDIATELY BEFORE BEDTIME AS NEEDED FOR SLEEP 90 tablet 2  . fexofenadine (ALLEGRA) 60 MG tablet Take 60 mg by mouth daily.    . fluconazole (DIFLUCAN) 150 MG tablet Take one po weekly for 3 weeks 3 tablet 0  . lansoprazole (PREVACID) 30 MG capsule TAKE 1 CAPSULE BY MOUTH DAILY AT 12 NOON. 90 capsule 2  . lithium carbonate (LITHOBID) 300 MG CR tablet TAKE 1 TABLET BY MOUTH TWICE DAILY 180 tablet 2  . LORazepam (ATIVAN) 1 MG tablet TAKE 1 TABLET BY MOUTH EVERY MORNING, AT NOON AND AT BEDTIME 180 tablet 2  . losartan (COZAAR) 100 MG tablet TAKE 1/2 TABLET BY MOUTH ONCE A DAY (Patient taking differently: Take 50 mg by mouth daily. 100 MG DAILY) 90 tablet 3  . metoprolol succinate (TOPROL-XL) 100 MG 24 hr tablet TAKE 1 TABLET (100 MG TOTAL) BY MOUTH DAILY. 90 tablet 2  . Multiple Vitamin (MULTIVITAMIN WITH MINERALS) TABS tablet Take 1 tablet by mouth daily. Men's Multivitamin    . nitroGLYCERIN (NITROSTAT) 0.4 MG SL tablet Place 0.4 mg under the tongue every 5 (five) minutes x 3 doses as needed for chest pain.     . ranolazine (RANEXA) 1000 MG SR  tablet TAKE 1 TABLET BY MOUTH TWO TIMES DAILY 180 tablet 3  . rosuvastatin (CRESTOR) 20 MG tablet TAKE 1 TABLET BY MOUTH AT BEDTIME 90 tablet 2  . [DISCONTINUED] diltiazem (CARDIZEM SR) 60 MG 12 hr capsule Take 1 capsule (60 mg total) by mouth 2 (two) times daily. 180 capsule 2  . [DISCONTINUED] triamterene-hydrochlorothiazide (DYAZIDE) 37.5-25 MG capsule Take 1 each (1 capsule total) by mouth daily. 90 capsule 2   No current facility-administered medications on file prior to visit.   Past Medical History:  Diagnosis Date  . Abscess of buttock 11/23/2020  . Angina pectoris (HCC) 10/26/2019  . Anxiety, generalized 05/22/2016  . Bilateral leg edema 01/29/2020  . CAD (coronary artery disease)    Cath 2013 LAD 95% stenosis, D1 90% stenosis, RCA 20%, Circ 30%.  Previous stent to D1 2012.   EF 60%.    . Class 2 obesity due to excess calories without serious comorbidity in adult 09/11/2016  . Confusional arousals 09/11/2016  . Dehydration 11/07/2020  . Depression   . Dizziness 10/27/2020  . Excessive daytime sleepiness 09/11/2016  . Herpes zoster 01/20/2020  . HTN (hypertension)   . Hypercholesterolemia 05/22/2016  . Hyperlipidemia   . Hypertension 05/22/2016  . Hypogonadism in male 05/22/2016  .  Hypotension 11/07/2020  . Low testosterone   . Nephrolithiasis   . OSA (obstructive sleep apnea) 09/11/2016  . OSA on CPAP 01/03/2017  . 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 Other fatigue 10/27/2020  . Paranoid reaction, acute (HCC) 05/22/2016  . Paronychia of finger, left 08/22/2020  . PLMD (periodic limb movement disorder) 09/11/2016  . PUD (peptic ulcer disease)   . Screening for prostate cancer 05/22/2016  . Severe episode of recurrent major depressive disorder, without psychotic features (HCC) 09/11/2016  . Snoring 09/11/2016  . TIA (transient ischemic attack)    Past Surgical History:  Procedure Laterality Date  . CHOLECYSTECTOMY    . CORONARY ARTERY BYPASS GRAFT  02/19/12   L -  LAD, SVG - D1, SVG - OM  . CORONARY STENT INTERVENTION N/A 11/05/2019   Procedure: CORONARY STENT INTERVENTION;  Surgeon: Runell Gess, MD;  Location: [facility];  Service: Cardiovascular;  Laterality: N/A;  SVG to DIAG  . LEFT HEART CATH AND CORS/GRAFTS ANGIOGRAPHY N/A 11/05/2019   Procedure: LEFT HEART CATH AND CORS/GRAFTS ANGIOGRAPHY;  Surgeon: Runell Gess, MD;  Location: [facility];  Service: Cardiovascular;  Laterality: N/A;  . RENAL ANGIOGRAPHY N/A 11/05/2019   Procedure: RENAL ANGIOGRAPHY;  Surgeon: Runell Gess, MD;  Location: [facility];  Service: Cardiovascular;  Laterality: N/A;  . TONSILLECTOMY AND ADENOIDECTOMY    . WRIST SURGERY      Family History  Problem Relation Age of Onset  . CAD Father 63  . Aneurysm Mother 72       cerebral  . Heart disease Sister        Unclear  . Healthy Brother    Social History   Socioeconomic History  . Marital status: Married    Spouse name: Not on file  . Number of children: Not on file  . Years of education: Not on file  . Highest education level: Bachelor's degree (e.g., BA, AB, BS)  Occupational History  . Not on file  Tobacco Use  . Smoking status: Never Smoker  . Smokeless tobacco: Never Used  Vaping Use  . Vaping Use: Never used  Substance and Sexual Activity  . Alcohol use: Yes    Alcohol/week: 6.0 standard drinks    Types: 6 Cans of beer per week  . Drug use: Not on file  . Sexual activity: Not on file  Other Topics Concern  . 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     Social Determinants of Health   Financial Resource Strain: Not on file  Food Insecurity: Not on file  Transportation Needs: Not on file  Physical Activity: Not on file  Stress: Not on file  Social Connections: Not on file    Review of Systems  Constitutional: Negative for activity change and appetite change.  HENT: Negative for  congestion and sinus pain.   Respiratory: Positive for shortness of breath. Negative for chest tightness.   Cardiovascular: Negative for chest pain, palpitations and leg swelling.  Gastrointestinal: Negative for abdominal pain.  Endocrine: Negative for polyuria.  Genitourinary: Positive for dysuria. Negative for difficulty urinating.  Musculoskeletal: Negative for arthralgias and back pain.  Neurological: Negative.   Psychiatric/Behavioral: Negative.      Objective:  BP (!) 172/98   Pulse 72   Resp 20   Wt 226 lb (102.5 kg)   SpO2 97%  BMI 32.43 kg/m   BP/Weight 01/30/2021 12/09/2020 11/23/2020  Systolic BP 172 120 128  Diastolic BP 98 82 84  Wt. (Lbs) 226 222.2 215  BMI 32.43 31.88 30.85    Physical Exam Vitals reviewed.  HENT:     Head: Normocephalic and atraumatic.     Mouth/Throat:     Mouth: Mucous membranes are moist.     Pharynx: Oropharynx is clear.  Eyes:     General: No visual field deficit.    Extraocular Movements: Extraocular movements intact.     Right eye: Normal extraocular motion.     Left eye: Normal extraocular motion.     Pupils: Pupils are equal, round, and reactive to light.  Cardiovascular:     Rate and Rhythm: Normal rate and regular rhythm.     Heart sounds: Normal heart sounds.  Pulmonary:     Effort: Pulmonary effort is normal. No respiratory distress.     Breath sounds: Normal breath sounds. No rales.  Abdominal:     General: Bowel sounds are normal.     Palpations: Abdomen is soft.  Musculoskeletal:        General: Normal range of motion.     Cervical back: Normal range of motion and neck supple.  Skin:    General: Skin is warm and dry.     Capillary Refill: Capillary refill takes less than 2 seconds.  Neurological:     Mental Status: He is alert.     Cranial Nerves: No cranial nerve deficit, dysarthria or facial asymmetry.       Lab Results  Component Value Date   WBC 8.4 11/07/2020   HGB 14.1 11/07/2020   HCT 40.6  11/07/2020   PLT 184 11/07/2020   GLUCOSE 110 (H) 11/15/2020   CHOL 145 06/07/2020   TRIG 119 06/07/2020   HDL 55 06/07/2020   LDLCALC 69 06/07/2020   ALT 19 11/07/2020   AST 21 11/07/2020   NA 143 11/15/2020   K 4.3 11/15/2020   CL 107 (H) 11/15/2020   CREATININE 1.19 11/15/2020   BUN 16 11/15/2020   CO2 21 11/15/2020   TSH 2.270 10/25/2020      Assessment & Plan:  Diagnoses and all orders for this visit: Primary hypertension -     hydrochlorothiazide (HYDRODIURIL) 25 MG tablet; Take 1 tablet (25 mg total) by mouth daily.BP has remained high after stopping 3 mediicnes in past.  .start on HCTZ for 2 weeks  Angina pectoris (HCC) This is all stable  Chronic nonintractable headache, unspecified headache type Patient having daily headaches, usually relieved by goody powders, we will try ubrelvy 100mg  samples         Follow-up: Return in about 2 weeks (around 02/13/2021).  An After Visit Summary was printed and given to the patient.  04/15/2021, MD [facility] [phone]

## 2021-01-31 ENCOUNTER — Other Ambulatory Visit (HOSPITAL_COMMUNITY): Payer: Self-pay

## 2021-02-01 ENCOUNTER — Other Ambulatory Visit (HOSPITAL_COMMUNITY): Payer: Self-pay

## 2021-02-03 ENCOUNTER — Other Ambulatory Visit: Payer: Self-pay

## 2021-02-03 ENCOUNTER — Ambulatory Visit (INDEPENDENT_AMBULATORY_CARE_PROVIDER_SITE_OTHER): Payer: No Typology Code available for payment source

## 2021-02-03 DIAGNOSIS — Z23 Encounter for immunization: Secondary | ICD-10-CM

## 2021-02-03 NOTE — Progress Notes (Signed)
   Covid-19 Vaccination Clinic  Name:  Jon Gonzales    MRN: [id number] DOB: Mar 22, 1961  02/03/2021  Mr. [name] was observed post Covid-19 immunization for 15 minutes without incident. He was provided with Vaccine Information Sheet and instruction to access the V-Safe system.   Mr. [name] was instructed to call 911 with any severe reactions post vaccine: Marland Kitchen Difficulty breathing  . Swelling of face and throat  . A fast heartbeat  . A bad rash all over body  . Dizziness and weakness   Immunizations Administered    Name Date Dose VIS Date Route   PFIZER Comrnaty(Gray TOP) Covid-19 Vaccine 02/03/2021 10:45 AM 0.3 mL 09/22/2020 Intramuscular   Manufacturer: [employer], [employer]   Lot: [id number]   NDC: [id number]

## 2021-02-04 ENCOUNTER — Other Ambulatory Visit (HOSPITAL_COMMUNITY): Payer: Self-pay

## 2021-02-04 MED FILL — Lansoprazole Cap Delayed Release 30 MG: ORAL | 90 days supply | Qty: 90 | Fill #0 | Status: AC

## 2021-02-04 MED FILL — Ranolazine Tab ER 12HR 1000 MG: ORAL | 90 days supply | Qty: 180 | Fill #0 | Status: AC

## 2021-02-06 ENCOUNTER — Other Ambulatory Visit (HOSPITAL_COMMUNITY): Payer: Self-pay

## 2021-02-07 ENCOUNTER — Other Ambulatory Visit (HOSPITAL_COMMUNITY): Payer: Self-pay

## 2021-02-20 ENCOUNTER — Other Ambulatory Visit (HOSPITAL_COMMUNITY): Payer: Self-pay

## 2021-02-20 MED FILL — Rosuvastatin Calcium Tab 20 MG: ORAL | 90 days supply | Qty: 90 | Fill #0 | Status: AC

## 2021-02-20 MED FILL — Losartan Potassium Tab 100 MG: ORAL | 90 days supply | Qty: 90 | Fill #0 | Status: AC

## 2021-02-27 ENCOUNTER — Other Ambulatory Visit (HOSPITAL_COMMUNITY): Payer: Self-pay

## 2021-02-27 MED FILL — Clopidogrel Bisulfate Tab 75 MG (Base Equiv): ORAL | 90 days supply | Qty: 90 | Fill #0 | Status: AC

## 2021-04-02 ENCOUNTER — Other Ambulatory Visit: Payer: Self-pay | Admitting: Legal Medicine

## 2021-04-02 DIAGNOSIS — F5101 Primary insomnia: Secondary | ICD-10-CM

## 2021-04-03 ENCOUNTER — Other Ambulatory Visit (HOSPITAL_COMMUNITY): Payer: Self-pay

## 2021-04-03 MED ORDER — ESZOPICLONE 3 MG PO TABS
ORAL_TABLET | ORAL | 2 refills | Status: DC
  Filled 2021-04-03: qty 90, 90d supply, fill #0
  Filled 2021-07-16: qty 90, 90d supply, fill #1

## 2021-04-11 ENCOUNTER — Ambulatory Visit (INDEPENDENT_AMBULATORY_CARE_PROVIDER_SITE_OTHER): Payer: No Typology Code available for payment source | Admitting: Cardiology

## 2021-04-11 ENCOUNTER — Encounter: Payer: Self-pay | Admitting: Cardiology

## 2021-04-11 ENCOUNTER — Other Ambulatory Visit: Payer: Self-pay

## 2021-04-11 VITALS — BP 104/86 | HR 78 | Ht 70.0 in | Wt 212.6 lb

## 2021-04-11 DIAGNOSIS — I2510 Atherosclerotic heart disease of native coronary artery without angina pectoris: Secondary | ICD-10-CM | POA: Diagnosis not present

## 2021-04-11 DIAGNOSIS — G459 Transient cerebral ischemic attack, unspecified: Secondary | ICD-10-CM

## 2021-04-11 DIAGNOSIS — I10 Essential (primary) hypertension: Secondary | ICD-10-CM

## 2021-04-11 DIAGNOSIS — Z9989 Dependence on other enabling machines and devices: Secondary | ICD-10-CM

## 2021-04-11 DIAGNOSIS — G4733 Obstructive sleep apnea (adult) (pediatric): Secondary | ICD-10-CM

## 2021-04-11 NOTE — Patient Instructions (Signed)
Medication Instructions:  Your physician recommends that you continue on your current medications as directed. Please refer to the Current Medication list given to you today.  *If you need a refill on your cardiac medications before your next appointment, please call your pharmacy*   Lab Work: None If you have labs (blood work) drawn today and your tests are completely normal, you will receive your results only by: MyChart Message (if you have MyChart) OR A paper copy in the mail If you have any lab test that is abnormal or we need to change your treatment, we will call you to review the results.   Testing/Procedures: None   Follow-Up: At [facility], you and your health needs are our priority.  As part of our continuing mission to provide you with exceptional heart care, we have created designated Provider Care Teams.  These Care Teams include your primary Cardiologist (physician) and Advanced Practice Providers (APPs -  Physician Assistants and Nurse Practitioners) who all work together to provide you with the care you need, when you need it.  We recommend signing up for the patient portal called "MyChart".  Sign up information is provided on this After Visit Summary.  MyChart is used to connect with patients for Virtual Visits (Telemedicine).  Patients are able to view lab/test results, encounter notes, upcoming appointments, etc.  Non-urgent messages can be sent to your provider as well.   To learn more about what you can do with MyChart, go to [email or web address].    Your next appointment:   1 year(s)  The format for your next appointment:   In Person  Provider:   [address] - Kardie Tobb, DO    Other Instructions   

## 2021-04-11 NOTE — Progress Notes (Signed)
Cardiology Office Note:    Date:  04/11/2021   ID:  Jon Gonzales, DOB Sep 13, 1961, MRN [id number]  PCP:  [name], MD  Cardiologist:  Thomasene Ripple, DO  Electrophysiologist:  None   Referring MD: [name],*   No chief complaint on file. I am doing well  History of Present Illness:    Jon Gonzales is a 60 y.o. male with a hx of coronary artery disease status post coronary artery bypass grafting x3 in 2012 with LIMA to LAD, SVG to diagonal, SVG to OM in January 2021 he had a DES to the obtuse marginal artery, hypertension, transient ischemic attack.   I last the patient on 1/26/20222 at that time he was experiencing some dizziness.  At that time we got some lab work repeated an echocardiogram and place a monitor on the patient.  In the interim he was able to get the monitor done as well as his echocardiogram did not show any abnormalities.  I saw the patient on December 09, 2020 at that time he had improved from his shortness of breath and was doing well.  No medication changes were made.  He is here today for follow-up visit.  He offers no complaints at this time.  Past Medical History:  Diagnosis Date   Abscess of buttock 11/23/2020   Angina pectoris (HCC) 10/26/2019   Anxiety, generalized 05/22/2016   Bilateral leg edema 01/29/2020   CAD (coronary artery disease)    Cath 2013 LAD 95% stenosis, D1 90% stenosis, RCA 20%, Circ 30%.  Previous stent to D1 2012.   EF 60%.     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   Other fatigue 10/27/2020   Paranoid reaction, acute (HCC) 05/22/2016   Paronychia of finger, left 08/22/2020   PLMD (periodic limb movement disorder) 09/11/2016   PUD (peptic ulcer disease)    Screening for prostate cancer 05/22/2016   Severe episode of recurrent major depressive disorder, without psychotic features (HCC) 09/11/2016   Snoring 09/11/2016   TIA (transient ischemic attack)     Past Surgical History:  Procedure Laterality Date   CHOLECYSTECTOMY     CORONARY ARTERY BYPASS GRAFT  02/19/12   L - LAD, SVG - D1, SVG - OM   CORONARY STENT INTERVENTION N/A 11/05/2019   Procedure: CORONARY STENT INTERVENTION;  Surgeon: Runell Gess, MD;  Location: [facility];  Service: Cardiovascular;  Laterality: N/A;  SVG to DIAG   LEFT HEART CATH AND CORS/GRAFTS ANGIOGRAPHY N/A 11/05/2019   Procedure: LEFT HEART CATH AND CORS/GRAFTS ANGIOGRAPHY;  Surgeon: Runell Gess, MD;  Location: [facility];  Service: Cardiovascular;  Laterality: N/A;   RENAL ANGIOGRAPHY N/A 11/05/2019   Procedure: RENAL ANGIOGRAPHY;  Surgeon: Runell Gess, MD;  Location: [facility];  Service: Cardiovascular;  Laterality: N/A;   TONSILLECTOMY AND ADENOIDECTOMY     WRIST SURGERY      Current Medications:  Current Meds  Medication Sig   aspirin EC 81 MG tablet Take 1 tablet (81 mg total) by mouth daily.   clopidogrel (PLAVIX) 75 MG tablet TAKE 1 TABLET BY MOUTH ONCE DAILY   DULoxetine (CYMBALTA) 60 MG capsule TAKE 1 CAPSULE BY MOUTH 2 TIMES DAILY   Eszopiclone 3 MG TABS TAKE 1 TABLET BY MOUTH IMMEDIATELY BEFORE BEDTIME AS NEEDED FOR SLEEP   fexofenadine (ALLEGRA) 60 MG tablet Take 60 mg by mouth daily.   hydrochlorothiazide (HYDRODIURIL) 25 MG tablet Take 1 tablet (25 mg total) by mouth daily.   lansoprazole (PREVACID) 30 MG capsule TAKE 1 CAPSULE BY MOUTH DAILY AT 12 NOON.   lithium carbonate (LITHOBID) 300  MG CR tablet TAKE 1 TABLET BY MOUTH TWICE DAILY   LORazepam (ATIVAN) 1 MG tablet TAKE 1 TABLET BY MOUTH EVERY MORNING, AT NOON AND AT BEDTIME   losartan (COZAAR) 100 MG tablet TAKE 1/2 TABLET BY MOUTH ONCE A DAY (Patient taking differently: Take 50 mg by mouth daily. 100 MG DAILY)   metoprolol succinate (TOPROL-XL) 100 MG 24 hr tablet TAKE 1 TABLET (100 MG TOTAL) BY MOUTH DAILY.   Multiple Vitamin (MULTIVITAMIN WITH MINERALS) TABS tablet Take 1 tablet by mouth daily. Men's Multivitamin   nitroGLYCERIN (NITROSTAT) 0.4 MG SL tablet Place 0.4 mg under the tongue every 5 (five) minutes x 3 doses as needed for chest pain.    ranolazine (RANEXA) 1000 MG SR tablet TAKE 1 TABLET BY MOUTH TWO TIMES DAILY   rosuvastatin (CRESTOR) 20 MG tablet TAKE 1 TABLET BY MOUTH AT BEDTIME     Allergies:   Trintellix [vortioxetine] and Penicillins   Social History   Socioeconomic History   Marital status: Married    Spouse name: Not on file   Number of children: Not on file   Years of education: Not on file   Highest education level: Bachelor's degree (e.g., BA, AB, BS)  Occupational History   Not on file  Tobacco Use   Smoking status: Never   Smokeless tobacco: Never  Vaping Use   Vaping Use: Never used  Substance and Sexual Activity   Alcohol use: Yes    Alcohol/week: 6.0 standard drinks    Types: 6 Cans of beer per week   Drug use: Not on file   Sexual activity: Not on file  Other Topics Concern   Not on file  Social History Narrative   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     Social Determinants of Health   Financial Resource Strain: Not on file  Food Insecurity: Not on file  Transportation Needs: Not on file  Physical Activity: Not on file  Stress: Not on file  Social Connections: Not on file     Family History: The patient's family history includes Aneurysm (age of onset: 35) in his mother; CAD (age of onset: 28) in his father; Healthy in his  brother; Heart disease in his sister.  ROS:   Review of Systems  Constitution: Negative for decreased appetite, fever and weight gain.  HENT: Negative for congestion, ear discharge, hoarse voice and sore throat.   Eyes: Negative for discharge, redness, vision loss in right eye and visual halos.  Cardiovascular: Negative for chest pain, dyspnea on exertion, leg swelling, orthopnea and palpitations.  Respiratory: Negative for cough, hemoptysis, shortness of breath and snoring.   Endocrine: Negative for heat intolerance and polyphagia.  Hematologic/Lymphatic: Negative for bleeding problem. Does not bruise/bleed easily.  Skin: Negative for flushing, nail changes, rash and suspicious lesions.  Musculoskeletal: Negative for arthritis, joint pain, muscle cramps, myalgias, neck pain and stiffness.  Gastrointestinal: Negative for abdominal pain, bowel incontinence, diarrhea and excessive appetite.  Genitourinary: Negative for decreased libido, genital sores and incomplete emptying.  Neurological: Negative for brief paralysis, focal weakness, headaches and loss of balance.  Psychiatric/Behavioral: Negative for altered mental status, depression and suicidal ideas.  Allergic/Immunologic: Negative for HIV exposure and persistent infections.    EKGs/Labs/Other Studies Reviewed:    The following studies were reviewed today:   EKG: None today  Zio monitor 12/01/2020 The patient wore the monitor for 12 days 3 hours starting November 09, 2020. Indication: Dizziness   The minimum heart rate was 48 bpm, maximum heart rate was 108 bpm, and average heart rate was 68 bpm. Predominant underlying rhythm was Sinus Rhythm.   Premature atrial complexes were rare less than 1%. Premature Ventricular complexes were rare less than 1%.   No no ventricular tachycardia, no pauses, No AV block, no supraventricular tachycardia and no atrial fibrillation present. No patient triggered events or diary events reported.    Conclusion: Normal/unremarkable study     TTE IMPRESSIONS 11/2020  1. GLS: -16.5. Left ventricular ejection fraction, by estimation, is 60  to 65%. The left ventricle has normal function. The left ventricle has no  regional wall motion abnormalities. Left ventricular diastolic parameters  were normal.   2. Right ventricular systolic function is normal. The right ventricular  size is normal. There is normal pulmonary artery systolic pressure.   3. The mitral valve is normal in structure. Mild mitral valve  regurgitation. No evidence of mitral stenosis.   4. The aortic valve is normal in structure. Aortic valve regurgitation is  not visualized. No aortic stenosis is present.   5. The inferior vena cava is normal in size with greater than 50%  respiratory variability, suggesting right atrial pressure of 3 mmHg.   FINDINGS   Left Ventricle: GLS: -16.5. Left ventricular ejection fraction, by  estimation, is 60 to 65%. The left ventricle has normal function. The left  ventricle has no regional wall motion abnormalities. The left ventricular  internal cavity size was normal in  size. There is borderline left ventricular hypertrophy. Left ventricular  diastolic parameters were normal.   Right Ventricle: The right ventricular size is normal. No increase in  right ventricular wall thickness. Right ventricular systolic function is  normal. There is normal pulmonary artery systolic pressure. The tricuspid  regurgitant velocity is 2.40 m/s, and   with an assumed right atrial pressure of 3 mmHg, the estimated right  ventricular systolic pressure is 26.0 mmHg.   Left Atrium: Left atrial size was normal in size.   Right Atrium: Right atrial size was normal in size.   Pericardium: There is no evidence of pericardial effusion.   Mitral Valve: The mitral valve is normal in structure. Mild mitral valve  regurgitation. No evidence of mitral valve stenosis.   Tricuspid Valve: The tricuspid valve  is normal in structure. Tricuspid  valve regurgitation is mild . No evidence of tricuspid stenosis.   Aortic Valve: The aortic valve is normal in structure. Aortic valve  regurgitation is not visualized. No aortic stenosis is present.   Pulmonic Valve: The pulmonic valve was normal in structure. Pulmonic valve  regurgitation is not visualized. No evidence of pulmonic stenosis.   Aorta: The aortic root is normal in size and structure.   Venous: The inferior vena cava  is normal in size with greater than 50%  respiratory variability, suggesting right atrial pressure of 3 mmHg.   IAS/Shunts: No atrial level shunt detected by color flow Doppler.      Recent Labs: 10/25/2020: TSH 2.270 11/07/2020: ALT 19; Hemoglobin 14.1; Platelets 184 11/15/2020: BUN 16; Creatinine, Ser 1.19; Magnesium 1.9; Potassium 4.3; Sodium 143  Recent Lipid Panel    Component Value Date/Time   CHOL 145 06/07/2020 0808   TRIG 119 06/07/2020 0808   HDL 55 06/07/2020 0808   CHOLHDL 2.6 06/07/2020 0808   LDLCALC 69 06/07/2020 0808    Physical Exam:    VS:  BP 104/86   Pulse 78   Ht 5\' 10"  (1.778 m)   Wt 212 lb 9.6 oz (96.4 kg)   SpO2 97%   BMI 30.50 kg/m     Wt Readings from Last 3 Encounters:  04/11/21 212 lb 9.6 oz (96.4 kg)  01/30/21 226 lb (102.5 kg)  12/09/20 222 lb 3.2 oz (100.8 kg)     GEN: Well nourished, well developed in no acute distress HEENT: Normal NECK: No JVD; No carotid bruits LYMPHATICS: No lymphadenopathy CARDIAC: S1S2 noted,RRR, no murmurs, rubs, gallops RESPIRATORY:  Clear to auscultation without rales, wheezing or rhonchi  ABDOMEN: Soft, non-tender, non-distended, +bowel sounds, no guarding. EXTREMITIES: No edema, No cyanosis, no clubbing MUSCULOSKELETAL:  No deformity  SKIN: Warm and dry NEUROLOGIC:  Alert and oriented x 3, non-focal PSYCHIATRIC:  Normal affect, good insight  ASSESSMENT:    1. Coronary artery disease involving native coronary artery of native heart,  unspecified whether angina present   2. Hypertension, unspecified type   3. TIA (transient ischemic attack)   4. OSA on CPAP    PLAN:    His blood pressure is much more improved compared to his prior visits we will continue patient's current medication regimen. No anginal symptoms I am really happy for the patient.  For now no need to pursue any ischemic evaluation. Continue use of CPAP. The patient understands the need to lose weight with diet and exercise. We have discussed specific strategies for this.  The patient is in agreement with the above plan. The patient left the office in stable condition.  The patient will follow up in 1 year or sooner if needed.   Medication Adjustments/Labs and Tests Ordered: Current medicines are reviewed at length with the patient today.  Concerns regarding medicines are outlined above.  No orders of the defined types were placed in this encounter.  No orders of the defined types were placed in this encounter.   Patient Instructions  Medication Instructions:   Your physician recommends that you continue on your current medications as directed. Please refer to the Current Medication list given to you today.  *If you need a refill on your cardiac medications before your next appointment, please call your pharmacy*   Lab Work: None  If you have labs (blood work) drawn today and your tests are completely normal, you will receive your results only by: MyChart Message (if you have MyChart) OR A paper copy in the mail If you have any lab test that is abnormal or we need to change your treatment, we will call you to review the results.   Testing/Procedures:  None   Follow-Up: At [facility], you and your health needs are our priority.  As part of our continuing mission to provide you with exceptional heart care, we have created designated Provider Care Teams.  These Care Teams include your  primary Cardiologist (physician) and Advanced  Practice Providers (APPs -  Physician Assistants and Nurse Practitioners) who all work together to provide you with the care you need, when you need it.  We recommend signing up for the patient portal called "MyChart".  Sign up information is provided on this After Visit Summary.  MyChart is used to connect with patients for Virtual Visits (Telemedicine).  Patients are able to view lab/test results, encounter notes, upcoming appointments, etc.  Non-urgent messages can be sent to your provider as well.   To learn more about what you can do with MyChart, go to [email or web address].    Your next appointment:   1 year(s)  The format for your next appointment:   In Person  Provider:   [address] - Thomasene RippleKardie Tewana Bohlen, DO   Other Instructions    Adopting a Healthy Lifestyle.  Know what a healthy weight is for you (roughly BMI <25) and aim to maintain this   Aim for 7+ servings of fruits and vegetables daily   65-80+ fluid ounces of water or unsweet tea for healthy kidneys   Limit to max 1 drink of alcohol per day; avoid smoking/tobacco   Limit animal fats in diet for cholesterol and heart health - choose grass fed whenever available   Avoid highly processed foods, and foods high in saturated/trans fats   Aim for low stress - take time to unwind and care for your mental health   Aim for 150 min of moderate intensity exercise weekly for heart health, and weights twice weekly for bone health   Aim for 7-9 hours of sleep daily   When it comes to diets, agreement about the perfect plan isnt easy to find, even among the experts. Experts at the [employer] developed an idea known as the Healthy Eating Plate. Just imagine a plate divided into logical, healthy portions.   The emphasis is on diet quality:   Load up on vegetables and fruits - one-half of your plate: Aim for color and variety, and remember that potatoes dont count.   Go for whole grains - one-quarter of  your plate: Whole wheat, barley, wheat berries, quinoa, oats, brown rice, and foods made with them. If you want pasta, go with whole wheat pasta.   Protein power - one-quarter of your plate: Fish, chicken, beans, and nuts are all healthy, versatile protein sources. Limit red meat.   The diet, however, does go beyond the plate, offering a few other suggestions.   Use healthy plant oils, such as olive, canola, soy, corn, sunflower and peanut. Check the labels, and avoid partially hydrogenated oil, which have unhealthy trans fats.   If youre thirsty, drink water. Coffee and tea are good in moderation, but skip sugary drinks and limit milk and dairy products to one or two daily servings.   The type of carbohydrate in the diet is more important than the amount. Some sources of carbohydrates, such as vegetables, fruits, whole grains, and beans-are healthier than others.   Finally, stay active  Signed, Thomasene RippleKardie Oluwatimilehin Balfour, DO  04/11/2021 1:48 PM    [facility]

## 2021-04-13 ENCOUNTER — Encounter: Payer: Self-pay | Admitting: Legal Medicine

## 2021-04-13 ENCOUNTER — Other Ambulatory Visit: Payer: Self-pay

## 2021-04-13 ENCOUNTER — Other Ambulatory Visit (HOSPITAL_COMMUNITY): Payer: Self-pay

## 2021-04-13 ENCOUNTER — Ambulatory Visit (INDEPENDENT_AMBULATORY_CARE_PROVIDER_SITE_OTHER): Payer: No Typology Code available for payment source | Admitting: Legal Medicine

## 2021-04-13 VITALS — BP 98/68 | HR 76 | Temp 97.1°F | Resp 16 | Ht 67.5 in | Wt 213.0 lb

## 2021-04-13 DIAGNOSIS — F3181 Bipolar II disorder: Secondary | ICD-10-CM | POA: Diagnosis not present

## 2021-04-13 DIAGNOSIS — K640 First degree hemorrhoids: Secondary | ICD-10-CM | POA: Diagnosis not present

## 2021-04-13 DIAGNOSIS — E291 Testicular hypofunction: Secondary | ICD-10-CM

## 2021-04-13 DIAGNOSIS — R634 Abnormal weight loss: Secondary | ICD-10-CM

## 2021-04-13 DIAGNOSIS — E7800 Pure hypercholesterolemia, unspecified: Secondary | ICD-10-CM

## 2021-04-13 DIAGNOSIS — Z0000 Encounter for general adult medical examination without abnormal findings: Secondary | ICD-10-CM

## 2021-04-13 DIAGNOSIS — I10 Essential (primary) hypertension: Secondary | ICD-10-CM

## 2021-04-13 MED ORDER — HYDROCORTISONE ACETATE 25 MG RE SUPP
25.0000 mg | Freq: Two times a day (BID) | RECTAL | 0 refills | Status: DC
  Filled 2021-04-13: qty 12, 6d supply, fill #0

## 2021-04-13 NOTE — Progress Notes (Signed)
Established Patient Office Visit  Subjective:  Patient ID: Jon Gonzales, male    DOB: 1961/08/14  Age: 60 y.o. MRN: [id number]  CC:  Chief Complaint  Patient presents with   Annual Exam    HPI Jon Gonzales presents for physical.  Patient is stable the CAD is stable and he saw Dr. Carlena Saxaub with good report. Hid BP is doing well.  He sees Dr, Landry CorporalBartly for bipolar disorder.  He is having trouble with step daughter with communications. His cholesterol is normal.  Medicines are stable.  Past Medical History:  Diagnosis Date   Abscess of buttock 11/23/2020   Angina pectoris (HCC) 10/26/2019   Anxiety, generalized 05/22/2016   Bilateral leg edema 01/29/2020   CAD (coronary artery disease)    Cath 2013 LAD 95% stenosis, D1 90% stenosis, RCA 20%, Circ 30%.  Previous stent to D1 2012.   EF 60%.     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   Other fatigue 10/27/2020   Paranoid reaction, acute (HCC) 05/22/2016   Paronychia of finger, left 08/22/2020   PLMD (periodic limb movement disorder) 09/11/2016   PUD (peptic ulcer disease)    Screening for prostate cancer 05/22/2016   Severe episode of recurrent major depressive disorder, without psychotic features (HCC) 09/11/2016   Snoring 09/11/2016   TIA (transient ischemic attack)     Past Surgical History:  Procedure Laterality Date   CHOLECYSTECTOMY     CORONARY ARTERY BYPASS GRAFT  02/19/12   L - LAD, SVG - D1, SVG - OM    CORONARY STENT INTERVENTION N/A 11/05/2019   Procedure: CORONARY STENT INTERVENTION;  Surgeon: Runell GessBerry, Jonathan J, MD;  Location: [facility];  Service: Cardiovascular;  Laterality: N/A;  SVG to DIAG   LEFT HEART CATH AND CORS/GRAFTS ANGIOGRAPHY N/A 11/05/2019   Procedure: LEFT HEART CATH AND CORS/GRAFTS ANGIOGRAPHY;  Surgeon: Runell GessBerry, Jonathan J, MD;  Location: [facility];  Service: Cardiovascular;  Laterality: N/A;   RENAL ANGIOGRAPHY N/A 11/05/2019   Procedure: RENAL ANGIOGRAPHY;  Surgeon: Runell GessBerry, Jonathan J, MD;  Location: [facility];  Service: Cardiovascular;  Laterality: N/A;   TONSILLECTOMY AND ADENOIDECTOMY     WRIST SURGERY      Family History  Problem Relation Age of Onset   CAD Father 6750   Aneurysm Mother [id number]       cerebral   Heart disease Sister        Unclear   Healthy Brother     Social History   Socioeconomic History   Marital status: Married    Spouse name: Not on file   Number of children: Not on file   Years of education: Not on file   Highest education level: Bachelor's degree (e.g., BA, AB, BS)  Occupational History   Not on file  Tobacco Use   Smoking status: Never   Smokeless tobacco: Never  Vaping Use   Vaping Use: Never used  Substance and Sexual Activity   Alcohol use: Yes    Alcohol/week: 6.0 standard drinks    Types: 6 Cans of beer per week   Drug use: Not on file   Sexual activity: Not on file  Other Topics Concern   Not on file  Social History Narrative   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     Social Determinants of Health   Financial Resource Strain: Not on file  Food Insecurity: Not on file  Transportation Needs: Not on file  Physical Activity: Not on file  Stress: Not on file  Social Connections: Not on file  Intimate Partner Violence: Not on file    Outpatient Medications Prior to Visit  Medication Sig Dispense Refill   aspirin EC 81 MG tablet Take 1 tablet (81 mg total)  by mouth daily. 30 tablet 0   clopidogrel (PLAVIX) 75 MG tablet TAKE 1 TABLET BY MOUTH ONCE DAILY 90 tablet 2   DULoxetine (CYMBALTA) 60 MG capsule TAKE 1 CAPSULE BY MOUTH 2 TIMES DAILY 180 capsule 2   Eszopiclone 3 MG TABS TAKE 1 TABLET BY MOUTH IMMEDIATELY BEFORE BEDTIME AS NEEDED FOR SLEEP 90 tablet 2   fexofenadine (ALLEGRA) 60 MG tablet Take 60 mg by mouth daily.     hydrochlorothiazide (HYDRODIURIL) 25 MG tablet Take 1 tablet (25 mg total) by mouth daily. 90 tablet 2   lansoprazole (PREVACID) 30 MG capsule TAKE 1 CAPSULE BY MOUTH DAILY AT 12 NOON. 90 capsule 2   lithium carbonate (LITHOBID) 300 MG CR tablet TAKE 1 TABLET BY MOUTH TWICE DAILY 180 tablet 2   LORazepam (ATIVAN) 1 MG tablet TAKE 1 TABLET BY MOUTH EVERY MORNING, AT NOON AND AT BEDTIME 180 tablet 2   losartan (COZAAR) 100 MG tablet TAKE 1/2 TABLET BY MOUTH ONCE A DAY (Patient taking differently: Take 50 mg by mouth daily. 100 MG DAILY) 90 tablet 3   metoprolol succinate (TOPROL-XL) 100 MG 24 hr tablet TAKE 1 TABLET (100 MG TOTAL) BY MOUTH DAILY. 90 tablet 2   Multiple Vitamin (MULTIVITAMIN WITH MINERALS) TABS tablet Take 1 tablet by mouth daily. Men's Multivitamin     nitroGLYCERIN (NITROSTAT) 0.4 MG SL tablet Place 0.4 mg under the tongue every 5 (five) minutes x 3 doses as needed for chest pain.      ranolazine (RANEXA) 1000 MG SR tablet TAKE 1 TABLET BY MOUTH TWO TIMES DAILY 180 tablet 3   rosuvastatin (CRESTOR) 20 MG tablet TAKE 1 TABLET BY MOUTH AT BEDTIME 90 tablet 2   No facility-administered medications prior to visit.    Allergies  Allergen Reactions   Trintellix [Vortioxetine] Other (See Comments)    Headaches.   Penicillins Rash    Did it involve swelling of the face/tongue/throat, SOB, or low BP? Unknown Did it involve sudden or severe rash/hives, skin peeling, or any reaction on the inside of your mouth or nose? Unknown Did you need to seek medical attention at a hospital or doctor's office? Unknown When did  it last happen? childhood reaction      If all above answers are "NO", may proceed with cephalosporin use.     ROS Review of Systems  Constitutional:  Negative for chills, fatigue and fever.  HENT:  Negative for congestion, ear pain and sore throat.   Respiratory:  Negative for cough and shortness of breath.   Cardiovascular:  Negative for chest pain.  Gastrointestinal:  Negative for abdominal pain, constipation,  diarrhea, nausea and vomiting.  Endocrine: Negative for polydipsia, polyphagia and polyuria.  Genitourinary:  Negative for dysuria and frequency.  Musculoskeletal:  Negative for arthralgias and myalgias.  Skin: Negative.   Neurological:  Negative for dizziness and headaches.  Psychiatric/Behavioral:  Negative for dysphoric mood.        No dysphoria     Objective:    Physical Exam Vitals reviewed.  Constitutional:      Appearance: Normal appearance.  HENT:     Head: Normocephalic and atraumatic.     Right Ear: Tympanic membrane, ear canal and external ear normal.     Left Ear: Tympanic membrane, ear canal and external ear normal.     Mouth/Throat:     Mouth: Mucous membranes are moist.     Pharynx: Oropharynx is clear.  Eyes:     Extraocular Movements: Extraocular movements intact.     Conjunctiva/sclera: Conjunctivae normal.     Pupils: Pupils are equal, round, and reactive to light.  Cardiovascular:     Rate and Rhythm: Normal rate and regular rhythm.     Pulses: Normal pulses.     Heart sounds: Normal heart sounds. No murmur heard.   No gallop.  Pulmonary:     Effort: Pulmonary effort is normal. No respiratory distress.     Breath sounds: No wheezing.  Abdominal:     General: Abdomen is flat. Bowel sounds are normal. There is no distension.     Palpations: Abdomen is soft.     Tenderness: There is no abdominal tenderness.  Musculoskeletal:     Cervical back: Normal range of motion and neck supple.  Neurological:     Mental Status: He is alert.     There were no vitals taken for this visit. Wt Readings from Last 3 Encounters:  04/11/21 212 lb 9.6 oz (96.4 kg)  01/30/21 226 lb (102.5 kg)  12/09/20 222 lb 3.2 oz (100.8 kg)     Health Maintenance Due  Topic Date Due   HIV Screening  Never done   Hepatitis C Screening  Never done   TETANUS/TDAP  Never done   Zoster Vaccines- Shingrix (1 of 2) Never done    There are no preventive care reminders to display for this patient.  Lab Results  Component Value Date   TSH 2.270 10/25/2020   Lab Results  Component Value Date   WBC 8.4 11/07/2020   HGB 14.1 11/07/2020   HCT 40.6 11/07/2020   MCV 99 (H) 11/07/2020   PLT 184 11/07/2020   Lab Results  Component Value Date   NA 143 11/15/2020   K 4.3 11/15/2020   CO2 21 11/15/2020   GLUCOSE 110 (H) 11/15/2020   BUN 16 11/15/2020   CREATININE 1.19 11/15/2020   BILITOT 0.7 11/07/2020   ALKPHOS 53 11/07/2020   AST 21 11/07/2020   ALT 19 11/07/2020   PROT 7.2 11/07/2020   ALBUMIN 4.4 11/07/2020   CALCIUM 8.9 11/15/2020   Lab Results  Component Value Date   CHOL 145 06/07/2020   Lab Results  Component Value Date   HDL 55 06/07/2020   Lab Results  Component Value Date   LDLCALC 69 06/07/2020   Lab Results  Component Value Date   TRIG 119 06/07/2020   Lab Results  Component Value Date   CHOLHDL 2.6 06/07/2020   No results found for: HGBA1C    Assessment & Plan:  Diagnoses and all orders for this visit: Routine general medical examination at a health care  facility   Patient has a routine physical exam  Grade I hemorrhoids -     hydrocortisone (ANUSOL-HC) 25 MG suppository; Unwrap and insert 1 suppository (25 mg total) rectally 2 (two) times daily. Patient is having some hemorrhoidal bleeding  Hypogonadism in male -     Testosterone Total,Free,Bio, Males History of low testosterone level, recheck      Follow-up: No follow-ups on file.    Brent Bulla, MD

## 2021-04-14 ENCOUNTER — Other Ambulatory Visit (HOSPITAL_COMMUNITY): Payer: Self-pay

## 2021-04-14 MED ORDER — OZEMPIC (0.25 OR 0.5 MG/DOSE) 2 MG/1.5ML ~~LOC~~ SOPN
0.5000 mg | PEN_INJECTOR | SUBCUTANEOUS | 3 refills | Status: DC
  Filled 2021-04-14: qty 1.5, 28d supply, fill #0

## 2021-04-14 NOTE — Addendum Note (Signed)
Addended by: Brent Bulla on: 04/14/2021 09:29 AM   Modules accepted: Orders

## 2021-04-14 NOTE — Addendum Note (Signed)
Addended by: Brent Bulla on: 04/14/2021 09:32 AM   Modules accepted: Orders

## 2021-04-14 NOTE — Addendum Note (Signed)
Addended by: Brent Bulla on: 04/14/2021 12:42 PM   Modules accepted: Orders

## 2021-04-18 ENCOUNTER — Other Ambulatory Visit (HOSPITAL_COMMUNITY): Payer: Self-pay

## 2021-04-18 ENCOUNTER — Ambulatory Visit: Payer: No Typology Code available for payment source | Admitting: Cardiology

## 2021-04-21 ENCOUNTER — Other Ambulatory Visit (HOSPITAL_COMMUNITY): Payer: Self-pay

## 2021-04-25 MED FILL — Metoprolol Succinate Tab ER 24HR 100 MG (Tartrate Equiv): ORAL | 90 days supply | Qty: 90 | Fill #1 | Status: AC

## 2021-04-25 MED FILL — Duloxetine HCl Enteric Coated Pellets Cap 60 MG (Base Eq): ORAL | 90 days supply | Qty: 180 | Fill #1 | Status: AC

## 2021-04-26 ENCOUNTER — Other Ambulatory Visit (HOSPITAL_COMMUNITY): Payer: Self-pay

## 2021-05-11 ENCOUNTER — Other Ambulatory Visit: Payer: Self-pay | Admitting: Legal Medicine

## 2021-05-11 ENCOUNTER — Other Ambulatory Visit (HOSPITAL_COMMUNITY): Payer: Self-pay

## 2021-05-11 DIAGNOSIS — I2510 Atherosclerotic heart disease of native coronary artery without angina pectoris: Secondary | ICD-10-CM

## 2021-05-11 MED ORDER — RANOLAZINE ER 1000 MG PO TB12
ORAL_TABLET | Freq: Two times a day (BID) | ORAL | 3 refills | Status: DC
Start: 2021-05-11 — End: 2021-08-17
  Filled 2021-05-11: qty 180, 90d supply, fill #0

## 2021-05-12 ENCOUNTER — Other Ambulatory Visit (HOSPITAL_COMMUNITY): Payer: Self-pay

## 2021-05-14 MED FILL — Losartan Potassium Tab 100 MG: ORAL | 90 days supply | Qty: 45 | Fill #1 | Status: AC

## 2021-05-14 MED FILL — Lansoprazole Cap Delayed Release 30 MG: ORAL | 90 days supply | Qty: 90 | Fill #1 | Status: AC

## 2021-05-14 MED FILL — Rosuvastatin Calcium Tab 20 MG: ORAL | 90 days supply | Qty: 90 | Fill #1 | Status: AC

## 2021-05-15 ENCOUNTER — Other Ambulatory Visit (HOSPITAL_COMMUNITY): Payer: Self-pay

## 2021-05-27 ENCOUNTER — Other Ambulatory Visit: Payer: Self-pay | Admitting: Legal Medicine

## 2021-05-27 ENCOUNTER — Other Ambulatory Visit (HOSPITAL_COMMUNITY): Payer: Self-pay

## 2021-05-27 DIAGNOSIS — I2510 Atherosclerotic heart disease of native coronary artery without angina pectoris: Secondary | ICD-10-CM

## 2021-05-28 MED ORDER — CLOPIDOGREL BISULFATE 75 MG PO TABS
ORAL_TABLET | Freq: Every day | ORAL | 2 refills | Status: DC
  Filled 2021-05-28: qty 90, 90d supply, fill #0
  Filled 2021-08-31: qty 90, 90d supply, fill #1

## 2021-05-29 ENCOUNTER — Other Ambulatory Visit: Payer: Self-pay | Admitting: Legal Medicine

## 2021-05-29 ENCOUNTER — Other Ambulatory Visit (HOSPITAL_COMMUNITY): Payer: Self-pay

## 2021-05-29 DIAGNOSIS — Z1211 Encounter for screening for malignant neoplasm of colon: Secondary | ICD-10-CM

## 2021-06-05 ENCOUNTER — Other Ambulatory Visit (HOSPITAL_COMMUNITY): Payer: Self-pay

## 2021-06-12 ENCOUNTER — Other Ambulatory Visit (HOSPITAL_COMMUNITY): Payer: Self-pay

## 2021-06-16 ENCOUNTER — Other Ambulatory Visit: Payer: Self-pay | Admitting: Physician Assistant

## 2021-06-16 ENCOUNTER — Other Ambulatory Visit (HOSPITAL_COMMUNITY): Payer: Self-pay

## 2021-06-16 MED ORDER — NITROGLYCERIN 0.4 MG SL SUBL
0.4000 mg | SUBLINGUAL_TABLET | SUBLINGUAL | 1 refills | Status: DC | PRN
  Filled 2021-06-16: qty 25, 8d supply, fill #0
  Filled 2022-03-30: qty 25, 8d supply, fill #1

## 2021-06-22 ENCOUNTER — Other Ambulatory Visit (HOSPITAL_COMMUNITY): Payer: Self-pay

## 2021-06-22 ENCOUNTER — Other Ambulatory Visit: Payer: Self-pay | Admitting: Legal Medicine

## 2021-06-22 MED ORDER — OZEMPIC (1 MG/DOSE) 4 MG/3ML ~~LOC~~ SOPN
1.0000 mg | PEN_INJECTOR | SUBCUTANEOUS | 3 refills | Status: DC
  Filled 2021-06-22: qty 3, 28d supply, fill #0

## 2021-06-22 MED ORDER — LIDOCAINE HCL 4 % EX GEL
2.0000 mL | Freq: Once | CUTANEOUS | 3 refills | Status: DC | PRN
  Filled 2021-06-22: qty 100, fill #0

## 2021-06-26 ENCOUNTER — Other Ambulatory Visit: Payer: Self-pay | Admitting: Legal Medicine

## 2021-06-26 ENCOUNTER — Other Ambulatory Visit (HOSPITAL_COMMUNITY): Payer: Self-pay

## 2021-06-26 MED ORDER — OZEMPIC (1 MG/DOSE) 4 MG/3ML ~~LOC~~ SOPN
1.0000 mg | PEN_INJECTOR | SUBCUTANEOUS | 3 refills | Status: DC
  Filled 2021-07-06: qty 9, 84d supply, fill #0

## 2021-06-26 MED ORDER — LIDOCAINE HCL 4 % EX GEL: 2.0000 mL | Freq: Once | CUTANEOUS | 3 refills | Status: DC | PRN

## 2021-06-27 ENCOUNTER — Other Ambulatory Visit: Payer: Self-pay

## 2021-07-03 ENCOUNTER — Other Ambulatory Visit (HOSPITAL_COMMUNITY): Payer: Self-pay

## 2021-07-06 ENCOUNTER — Other Ambulatory Visit (HOSPITAL_COMMUNITY): Payer: Self-pay

## 2021-07-09 ENCOUNTER — Other Ambulatory Visit: Payer: Self-pay | Admitting: Legal Medicine

## 2021-07-09 DIAGNOSIS — I10 Essential (primary) hypertension: Secondary | ICD-10-CM

## 2021-07-09 MED ORDER — METOPROLOL SUCCINATE ER 100 MG PO TB24
ORAL_TABLET | Freq: Every day | ORAL | 2 refills | Status: DC
  Filled 2021-07-09: qty 90, 90d supply, fill #0
  Filled 2021-11-04: qty 90, 90d supply, fill #1
  Filled 2022-02-20 (×2): qty 45, 45d supply, fill #2

## 2021-07-09 MED FILL — Losartan Potassium Tab 100 MG: ORAL | 90 days supply | Qty: 45 | Fill #2 | Status: CN

## 2021-07-10 ENCOUNTER — Other Ambulatory Visit (HOSPITAL_COMMUNITY): Payer: Self-pay

## 2021-07-17 ENCOUNTER — Other Ambulatory Visit (HOSPITAL_COMMUNITY): Payer: Self-pay

## 2021-07-18 ENCOUNTER — Ambulatory Visit (INDEPENDENT_AMBULATORY_CARE_PROVIDER_SITE_OTHER): Payer: No Typology Code available for payment source

## 2021-07-18 ENCOUNTER — Other Ambulatory Visit: Payer: Self-pay

## 2021-07-18 DIAGNOSIS — Z23 Encounter for immunization: Secondary | ICD-10-CM | POA: Diagnosis not present

## 2021-07-18 NOTE — Progress Notes (Signed)
   Cov[facility] Vaccination [facility]  Name:  KAVIR SAVOCA    MRN: [id number] DOB: 1960-12-30  07/18/2021  Mr. Pridmore was observed post Covid-19 immunization for 15 minutes without incident. He was provided with Vaccine Information Sheet and instruction to access the V-Safe system.   Mr. Brentlinger was instructed to call 911 with any severe reactions post vaccine: Difficulty breathing  Swelling of face and throat  A fast heartbeat  A bad rash all over body  Dizziness and weakness

## 2021-08-03 ENCOUNTER — Ambulatory Visit (INDEPENDENT_AMBULATORY_CARE_PROVIDER_SITE_OTHER): Payer: No Typology Code available for payment source | Admitting: Nurse Practitioner

## 2021-08-03 ENCOUNTER — Encounter: Payer: Self-pay | Admitting: Nurse Practitioner

## 2021-08-03 VITALS — BP 102/62 | HR 67 | Temp 97.2°F | Resp 18 | Ht 67.5 in | Wt 188.0 lb

## 2021-08-03 DIAGNOSIS — M545 Low back pain, unspecified: Secondary | ICD-10-CM | POA: Diagnosis not present

## 2021-08-03 DIAGNOSIS — R222 Localized swelling, mass and lump, trunk: Secondary | ICD-10-CM | POA: Diagnosis not present

## 2021-08-03 LAB — POCT URINALYSIS DIP (CLINITEK)
Bilirubin, UA: NEGATIVE
Blood, UA: NEGATIVE
Glucose, UA: NEGATIVE mg/dL
Ketones, POC UA: NEGATIVE mg/dL
Leukocytes, UA: NEGATIVE
Nitrite, UA: NEGATIVE
Spec Grav, UA: 1.015 (ref 1.010–1.025)
Urobilinogen, UA: 0.2 E.U./dL
pH, UA: 6 (ref 5.0–8.0)

## 2021-08-03 MED ORDER — KETOROLAC TROMETHAMINE 60 MG/2ML IM SOLN
60.0000 mg | Freq: Once | INTRAMUSCULAR | Status: AC
  Administered 2021-08-03: 60 mg via INTRAMUSCULAR

## 2021-08-03 NOTE — Progress Notes (Signed)
Acute Office Visit  Subjective:    Patient ID: Jon Gonzales, male    DOB: [birth year], 60 y.o.   MRN: [id number]  Chief Complaint  Patient presents with   Back Pain    L side    Back Pain  Patient is in today for back pain. Onset was two weeks ago, right after he received COVID-19 booster. He is accompanied by his significant other. States he has a palpable mass to left lower back at the site of back pain.  Back Pain  He reports new onset back pain. There was not an injury that may have caused the pain. The most recent episode started a few weeks ago and is staying constant. The pain is located in the left lumbar areawithout radiation. It is described as aching, is 7/10 in intensity, occurring intermittently. Symptoms are worse in the: no difference in time of day.  Aggravating factors: movement Relieving factors: none.  He has tried NSAIDs and Flexeril with little relief.   Associated symptoms: No abdominal pain No bowel incontinence  No chest pain No dysuria   No fever Yes headaches  No joint pains No pelvic pain  No weakness in leg  No tingling in lower extremities  No urinary incontinence Yes weight loss-intentional       Past Medical History:  Diagnosis Date   Abscess of buttock 11/23/2020   Angina pectoris (HCC) 10/26/2019   Anxiety, generalized 05/22/2016   Bilateral leg edema 01/29/2020   CAD (coronary artery disease)    Cath 2013 LAD 95% stenosis, D1 90% stenosis, RCA 20%, Circ 30%.  Previous stent to D1 2012.   EF 60%.     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   Other fatigue 10/27/2020   Paranoid reaction, acute (HCC) 05/22/2016   Paronychia of finger, left 08/22/2020   PLMD (periodic limb movement disorder) 09/11/2016   PUD (peptic ulcer disease)    Screening for prostate cancer 05/22/2016   Severe episode of recurrent major depressive disorder, without psychotic features (HCC) 09/11/2016   Snoring 09/11/2016   TIA (transient ischemic attack)     Past Surgical History:  Procedure Laterality Date   CHOLECYSTECTOMY     CORONARY ARTERY BYPASS GRAFT  02/19/12   L - LAD, SVG - D1, SVG - OM   CORONARY STENT INTERVENTION N/A 11/05/2019   Procedure: CORONARY STENT INTERVENTION;  Surgeon: Runell Gess, MD;  Location: [facility];  Service: Cardiovascular;  Laterality: N/A;  SVG to DIAG   LEFT HEART CATH AND CORS/GRAFTS ANGIOGRAPHY N/A 11/05/2019   Procedure: LEFT HEART CATH AND CORS/GRAFTS ANGIOGRAPHY;  Surgeon: Runell Gess, MD;  Location: [facility];  Service: Cardiovascular;  Laterality: N/A;   RENAL ANGIOGRAPHY N/A 11/05/2019   Procedure: RENAL ANGIOGRAPHY;  Surgeon: Runell Gess, MD;  Location: [facility];  Service: Cardiovascular;  Laterality: N/A;   TONSILLECTOMY AND ADENOIDECTOMY     WRIST  SURGERY      Family History  Problem Relation Age of Onset   CAD Father 76   Aneurysm Mother 58       cerebral   Heart disease Sister        Unclear   Healthy Brother     Social History   Socioeconomic History   Marital status: Married    Spouse name: Not on file   Number of children: Not on file   Years of education: Not on file   Highest education level: Bachelor's degree (e.g., BA, AB, BS)  Occupational History   Not on file  Tobacco Use   Smoking status: Never   Smokeless tobacco: Never  Vaping Use   Vaping Use: Never used  Substance and Sexual Activity   Alcohol use: Yes     Alcohol/week: 6.0 standard drinks    Types: 6 Cans of beer per week   Drug use: Not on file   Sexual activity: Not on file  Other Topics Concern   Not on file  Social History Narrative   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     Social Determinants of Health   Financial Resource Strain: Not on file  Food Insecurity: Not on file  Transportation Needs: Not on file  Physical Activity: Not on file  Stress: Not on file  Social Connections: Not on file  Intimate Partner Violence: Not on file    Outpatient Medications Prior to Visit  Medication Sig Dispense Refill   aspirin EC 81 MG tablet Take 1 tablet (81 mg total) by mouth daily. 30 tablet 0   clopidogrel (PLAVIX) 75 MG tablet TAKE 1 TABLET BY MOUTH ONCE DAILY 90 tablet 2   DULoxetine (CYMBALTA) 60 MG capsule TAKE 1 CAPSULE BY MOUTH 2 TIMES DAILY 180 capsule 2   Eszopiclone 3 MG TABS TAKE 1 TABLET BY MOUTH IMMEDIATELY BEFORE BEDTIME AS NEEDED FOR SLEEP 90 tablet 2   fexofenadine (ALLEGRA) 60 MG tablet Take 60 mg by mouth daily.     hydrochlorothiazide (HYDRODIURIL) 25 MG tablet Take 1 tablet (25 mg total) by mouth daily. 90 tablet 2   hydrocortisone (ANUSOL-HC) 25 MG suppository Unwrap and insert 1 suppository (25 mg total) rectally 2 (two) times daily. 12 suppository 0   lansoprazole (PREVACID) 30 MG capsule TAKE 1 CAPSULE BY MOUTH DAILY AT 12 NOON. 90 capsule 2   Lidocaine HCl 4 % GEL Apply 2 mLs topically once as needed for up to 1 dose. 100 mL 3   lithium carbonate (LITHOBID) 300 MG CR tablet TAKE 1 TABLET BY MOUTH TWICE DAILY 180 tablet 2   LORazepam (ATIVAN) 1 MG tablet TAKE 1 TABLET BY MOUTH EVERY MORNING, AT NOON AND AT BEDTIME 180 tablet 2   losartan (COZAAR) 100 MG tablet TAKE 1/2 TABLET BY MOUTH ONCE A DAY (Patient taking differently: Take 50 mg by mouth daily. 100 MG DAILY) 90 tablet 3   metoprolol succinate (TOPROL-XL) 100 MG 24 hr tablet TAKE 1 TABLET (100 MG TOTAL) BY MOUTH DAILY. 90  tablet 2   Multiple Vitamin (MULTIVITAMIN WITH MINERALS) TABS tablet Take 1 tablet by mouth daily. Men's Multivitamin     nitroGLYCERIN (NITROSTAT) 0.4 MG SL tablet Dissolve 1 tablet (0.4 mg total) under the tongue every 5 (five) minutes up to 3 doses as needed for chest pain. If no relief after 3 doses call 911. 25 tablet 1   ranolazine (RANEXA) 1000 MG SR tablet  TAKE 1 TABLET BY MOUTH TWO TIMES DAILY 180 tablet 3   rosuvastatin (CRESTOR) 20 MG tablet TAKE 1 TABLET BY MOUTH AT BEDTIME 90 tablet 2   Semaglutide, 1 MG/DOSE, (OZEMPIC, 1 MG/DOSE,) 4 MG/3ML SOPN Inject 1 mg into the skin once a week. 6 mL 3   No facility-administered medications prior to visit.    Allergies  Allergen Reactions   Trintellix [Vortioxetine] Other (See Comments)    Headaches.   Penicillins Rash    Did it involve swelling of the face/tongue/throat, SOB, or low BP? Unknown Did it involve sudden or severe rash/hives, skin peeling, or any reaction on the inside of your mouth or nose? Unknown Did you need to seek medical attention at a hospital or doctor's office? Unknown When did it last happen? childhood reaction      If all above answers are "NO", may proceed with cephalosporin use.     Review of Systems  Eyes: Negative.   Respiratory:  Negative for cough and shortness of breath.   Endocrine: Negative.   Genitourinary:  Negative for decreased urine volume, difficulty urinating, frequency, hematuria and urgency.  Musculoskeletal:  Positive for back pain.  Skin: Negative.   Allergic/Immunologic: Negative.   Neurological: Negative.   All other systems reviewed and are negative.     Objective:    Physical Exam Vitals reviewed.  Cardiovascular:     Rate and Rhythm: Normal rate and regular rhythm.     Pulses: Normal pulses.     Heart sounds: Normal heart sounds.  Pulmonary:     Effort: Pulmonary effort is normal.     Breath sounds: Normal breath sounds.  Abdominal:     General: Bowel sounds are normal.      Palpations: Abdomen is soft.  Musculoskeletal:        General: Tenderness present.     Lumbar back: Tenderness present. No spasms. Normal range of motion. Positive right straight leg raise test. Negative left straight leg raise test.       Back:     Comments: Palpable soft tissue mass to left lower lumbar area, approximately 3 mm in diameter  Skin:    General: Skin is warm and dry.     Capillary Refill: Capillary refill takes less than 2 seconds.  Neurological:     General: No focal deficit present.     Mental Status: He is alert and oriented to person, place, and time.    BP 102/62   Pulse 67   Temp (!) 97.2 F (36.2 C)   Resp 18   Ht 5\' 10"  (1.778 m)   Wt 188 lb (85.3 kg)   SpO2 98%   BMI 26.98 kg/m  Wt Readings from Last 3 Encounters:  08/03/21 188 lb (85.3 kg)  04/13/21 213 lb (96.6 kg)  04/11/21 212 lb 9.6 oz (96.4 kg)    Health Maintenance Due  Topic Date Due   Pneumococcal Vaccine 73-3 Years old (1 - PCV) Never done   HIV Screening  Never done   Hepatitis C Screening  Never done   TETANUS/TDAP  Never done   Zoster Vaccines- Shingrix (1 of 2) Never done   COVID-19 Vaccine (3 - Booster for Pfizer series) 03/31/2021   INFLUENZA VACCINE  05/15/2021     Lab Results  Component Value Date   TSH 2.270 10/25/2020   Lab Results  Component Value Date   WBC 8.4 11/07/2020   HGB 14.1 11/07/2020   HCT 40.6 11/07/2020   MCV  99 (H) 11/07/2020   PLT 184 11/07/2020   Lab Results  Component Value Date   NA 143 11/15/2020   K 4.3 11/15/2020   CO2 21 11/15/2020   GLUCOSE 110 (H) 11/15/2020   BUN 16 11/15/2020   CREATININE 1.19 11/15/2020   BILITOT 0.7 11/07/2020   ALKPHOS 53 11/07/2020   AST 21 11/07/2020   ALT 19 11/07/2020   PROT 7.2 11/07/2020   ALBUMIN 4.4 11/07/2020   CALCIUM 8.9 11/15/2020   Lab Results  Component Value Date   CHOL 145 06/07/2020   Lab Results  Component Value Date   HDL 55 06/07/2020   Lab Results  Component Value Date    LDLCALC 69 06/07/2020   Lab Results  Component Value Date   TRIG 119 06/07/2020   Lab Results  Component Value Date   CHOLHDL 2.6 06/07/2020        Assessment & Plan:    1. Acute left-sided low back pain without sciatica - POCT URINALYSIS DIP (CLINITEK) - ketorolac (TORADOL) injection 60 mg - [place] [facility]; Future  2. Palpable mass of lower back - [place] [facility]; Future   Follow-up: pending back [place]  I, Janie Morning, NP, have reviewed all documentation for this visit. The documentation on 08/04/21 for the exam, diagnosis, procedures, and orders are all accurate and complete.   Signed, Janie Morning, NP 08/03/21 at 9:05 PM

## 2021-08-07 ENCOUNTER — Ambulatory Visit (INDEPENDENT_AMBULATORY_CARE_PROVIDER_SITE_OTHER): Payer: No Typology Code available for payment source

## 2021-08-07 ENCOUNTER — Other Ambulatory Visit: Payer: Self-pay

## 2021-08-07 DIAGNOSIS — Z23 Encounter for immunization: Secondary | ICD-10-CM

## 2021-08-15 ENCOUNTER — Encounter: Payer: Self-pay | Admitting: Nurse Practitioner

## 2021-08-15 ENCOUNTER — Ambulatory Visit (INDEPENDENT_AMBULATORY_CARE_PROVIDER_SITE_OTHER): Payer: No Typology Code available for payment source | Admitting: Nurse Practitioner

## 2021-08-15 VITALS — BP 124/76 | HR 60 | Temp 97.2°F | Resp 16 | Ht 67.5 in

## 2021-08-15 DIAGNOSIS — H8103 Meniere's disease, bilateral: Secondary | ICD-10-CM | POA: Diagnosis not present

## 2021-08-15 DIAGNOSIS — H6692 Otitis media, unspecified, left ear: Secondary | ICD-10-CM | POA: Diagnosis not present

## 2021-08-15 MED ORDER — TRIAMCINOLONE ACETONIDE 40 MG/ML IJ SUSP
60.0000 mg | Freq: Once | INTRAMUSCULAR | Status: AC
  Administered 2021-08-15: 60 mg via INTRAMUSCULAR

## 2021-08-15 MED ORDER — AZITHROMYCIN 250 MG PO TABS: ORAL_TABLET | ORAL | 0 refills | Status: AC

## 2021-08-15 MED ORDER — PREDNISONE 50 MG PO TABS: ORAL_TABLET | ORAL | 0 refills | Status: DC

## 2021-08-15 NOTE — Progress Notes (Signed)
Acute Office Visit  Subjective:    Patient ID: Jon Gonzales, male    DOB: [birth year], 60 y.o.   MRN: [id number]  CC: Tinnitus Hearing loss  HPI: Patient is in today for acute dizziness, tinnitus, bilateral hearing loss, and nasal congestion. States he has a bad taste in his mouth. Onset of his symptoms was today. He has a past history of Meniere's disease. He has not had any treatment for symptoms.   Past Medical History:  Diagnosis Date   Abscess of buttock 11/23/2020   Angina pectoris ([place]) 10/26/2019   Anxiety, generalized 05/22/2016   Bilateral leg edema 01/29/2020   CAD (coronary artery disease)    Cath 2013 LAD 95% stenosis, D1 90% stenosis, RCA 20%, Circ 30%.  Previous stent to D1 2012.   EF 60%.     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   Other fatigue 10/27/2020   Paranoid reaction, acute ([place]) 05/22/2016   Paronychia of finger, left 08/22/2020   PLMD (periodic limb movement disorder) 09/11/2016   PUD (peptic ulcer disease)    Screening for prostate cancer 05/22/2016   Severe episode of recurrent major depressive disorder, without psychotic features ([place]) 09/11/2016   Snoring 09/11/2016   TIA (transient ischemic attack)     Past Surgical History:  Procedure Laterality Date   CHOLECYSTECTOMY     CORONARY ARTERY BYPASS GRAFT  02/19/12   L - LAD, SVG - D1, SVG - OM   CORONARY STENT INTERVENTION N/A 11/05/2019   Procedure: CORONARY STENT  INTERVENTION;  Surgeon: Lorretta Harp, MD;  Location: [facility];  Service: Cardiovascular;  Laterality: N/A;  SVG to DIAG   LEFT HEART CATH AND CORS/GRAFTS ANGIOGRAPHY N/A 11/05/2019   Procedure: LEFT HEART CATH AND CORS/GRAFTS ANGIOGRAPHY;  Surgeon: Lorretta Harp, MD;  Location: [facility];  Service: Cardiovascular;  Laterality: N/A;   RENAL ANGIOGRAPHY N/A 11/05/2019   Procedure: RENAL ANGIOGRAPHY;  Surgeon: Lorretta Harp, MD;  Location: [facility];  Service: Cardiovascular;  Laterality: N/A;   TONSILLECTOMY AND ADENOIDECTOMY     WRIST SURGERY      Family History  Problem Relation Age of Onset   CAD Father 57   Aneurysm Mother 37       cerebral   Heart disease Sister        Unclear   Healthy Brother     Social History   Socioeconomic History   Marital status: Married    Spouse name: Not on file   Number of children: Not on file   Years of education: Not on file   Highest education level: Bachelor's degree (e.g., BA, AB, BS)  Occupational History   Not on file  Tobacco Use   Smoking status: Never   Smokeless tobacco: Never  Vaping Use   Vaping Use: Never used  Substance and Sexual Activity   Alcohol use: Yes    Alcohol/week: 6.0 standard drinks  Types: 6 Cans of beer per week   Drug use: Not on file   Sexual activity: Not on file  Other Topics Concern   Not on file  Social History Narrative   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     Social Determinants of Health   Financial Resource Strain: Not on file  Food Insecurity: Not on file  Transportation Needs: Not on file  Physical Activity: Not on file  Stress: Not on file  Social Connections: Not on file  Intimate Partner Violence: Not on file    Outpatient Medications Prior to Visit  Medication Sig Dispense Refill   aspirin EC 81 MG tablet Take 1 tablet (81 mg total) by mouth daily. 30 tablet 0   clopidogrel (PLAVIX) 75 MG tablet TAKE 1  TABLET BY MOUTH ONCE DAILY 90 tablet 2   DULoxetine (CYMBALTA) 60 MG capsule TAKE 1 CAPSULE BY MOUTH 2 TIMES DAILY 180 capsule 2   Eszopiclone 3 MG TABS TAKE 1 TABLET BY MOUTH IMMEDIATELY BEFORE BEDTIME AS NEEDED FOR SLEEP 90 tablet 2   fexofenadine (ALLEGRA) 60 MG tablet Take 60 mg by mouth daily.     hydrochlorothiazide (HYDRODIURIL) 25 MG tablet Take 1 tablet (25 mg total) by mouth daily. 90 tablet 2   hydrocortisone (ANUSOL-HC) 25 MG suppository Unwrap and insert 1 suppository (25 mg total) rectally 2 (two) times daily. 12 suppository 0   lansoprazole (PREVACID) 30 MG capsule TAKE 1 CAPSULE BY MOUTH DAILY AT 12 NOON. 90 capsule 2   Lidocaine HCl 4 % GEL Apply 2 mLs topically once as needed for up to 1 dose. 100 mL 3   lithium carbonate (LITHOBID) 300 MG CR tablet TAKE 1 TABLET BY MOUTH TWICE DAILY 180 tablet 2   LORazepam (ATIVAN) 1 MG tablet TAKE 1 TABLET BY MOUTH EVERY MORNING, AT NOON AND AT BEDTIME 180 tablet 2   losartan (COZAAR) 100 MG tablet TAKE 1/2 TABLET BY MOUTH ONCE A DAY (Patient taking differently: Take 50 mg by mouth daily. 100 MG DAILY) 90 tablet 3   metoprolol succinate (TOPROL-XL) 100 MG 24 hr tablet TAKE 1 TABLET (100 MG TOTAL) BY MOUTH DAILY. 90 tablet 2   Multiple Vitamin (MULTIVITAMIN WITH MINERALS) TABS tablet Take 1 tablet by mouth daily. Men's Multivitamin     nitroGLYCERIN (NITROSTAT) 0.4 MG SL tablet Dissolve 1 tablet (0.4 mg total) under the tongue every 5 (five) minutes up to 3 doses as needed for chest pain. If no relief after 3 doses call 911. 25 tablet 1   ranolazine (RANEXA) 1000 MG SR tablet TAKE 1 TABLET BY MOUTH TWO TIMES DAILY 180 tablet 3   rosuvastatin (CRESTOR) 20 MG tablet TAKE 1 TABLET BY MOUTH AT BEDTIME 90 tablet 2   Semaglutide, 1 MG/DOSE, (OZEMPIC, 1 MG/DOSE,) 4 MG/3ML SOPN Inject 1 mg into the skin once a week. 6 mL 3   No facility-administered medications prior to visit.    Allergies  Allergen Reactions   Trintellix [Vortioxetine] Other (See  Comments)    Headaches.   Penicillins Rash    Did it involve swelling of the face/tongue/throat, SOB, or low BP? Unknown Did it involve sudden or severe rash/hives, skin peeling, or any reaction on the inside of your mouth or nose? Unknown Did you need to seek medical attention at a hospital or doctor's office? Unknown When did it last happen? childhood reaction      If all above answers are "  NO", may proceed with cephalosporin use.     Review of Systems  Constitutional:  Positive for fatigue. Negative for appetite change and unexpected weight change.  HENT:  Positive for congestion, hearing loss (bilaterally), rhinorrhea and tinnitus. Negative for ear pain, sinus pressure and sinus pain.   Eyes:  Negative for pain.  Respiratory:  Negative for cough and shortness of breath.   Cardiovascular:  Negative for chest pain, palpitations and leg swelling.  Gastrointestinal:  Negative for abdominal pain, constipation, diarrhea, nausea and vomiting.  Endocrine: Negative for cold intolerance, heat intolerance, polydipsia, polyphagia and polyuria.  Genitourinary:  Negative for dysuria, frequency and hematuria.  Musculoskeletal:  Negative for arthralgias, back pain, joint swelling and myalgias.  Skin:  Positive for pallor. Negative for rash.  Allergic/Immunologic: Positive for environmental allergies.  Neurological:  Positive for dizziness. Negative for headaches.  Hematological:  Negative for adenopathy.  Psychiatric/Behavioral:  Negative for decreased concentration and sleep disturbance. The patient is not nervous/anxious.       Objective:    Physical Exam Vitals reviewed.  HENT:     Right Ear: Decreased hearing noted. No tenderness.     Left Ear: Decreased hearing noted. No tenderness. Tympanic membrane is injected and erythematous.     Nose: Congestion and rhinorrhea present.     Mouth/Throat:     Mouth: Mucous membranes are dry.  Eyes:     Pupils: Pupils are equal, round, and reactive  to light.  Cardiovascular:     Rate and Rhythm: Normal rate and regular rhythm.     Pulses: Normal pulses.     Heart sounds: Normal heart sounds.  Abdominal:     General: Bowel sounds are normal.     Palpations: Abdomen is soft.  Skin:    General: Skin is warm and dry.     Capillary Refill: Capillary refill takes less than 2 seconds.     Coloration: Skin is pale.  Neurological:     General: No focal deficit present.     Mental Status: He is alert and oriented to person, place, and time.  Psychiatric:        Mood and Affect: Mood normal.        Behavior: Behavior normal.    BP 124/76   Pulse 60   Temp (!) 97.2 F (36.2 C)   Resp 16   Wt Readings from Last 3 Encounters:  08/03/21 188 lb (85.3 kg)  04/13/21 213 lb (96.6 kg)  04/11/21 212 lb 9.6 oz (96.4 kg)    Health Maintenance Due  Topic Date Due   Pneumococcal Vaccine 10-60 Years old (1 - PCV) Never done   HIV Screening  Never done   Hepatitis C Screening  Never done   TETANUS/TDAP  Never done   Zoster Vaccines- Shingrix (1 of 2) Never done     Lab Results  Component Value Date   TSH 2.270 10/25/2020   Lab Results  Component Value Date   WBC 8.4 11/07/2020   HGB 14.1 11/07/2020   HCT 40.6 11/07/2020   MCV 99 (H) 11/07/2020   PLT 184 11/07/2020   Lab Results  Component Value Date   NA 143 11/15/2020   K 4.3 11/15/2020   CO2 21 11/15/2020   GLUCOSE 110 (H) 11/15/2020   BUN 16 11/15/2020   CREATININE 1.19 11/15/2020   BILITOT 0.7 11/07/2020   ALKPHOS 53 11/07/2020   AST 21 11/07/2020   ALT 19 11/07/2020   PROT 7.2 11/07/2020   ALBUMIN  4.4 11/07/2020   CALCIUM 8.9 11/15/2020   Lab Results  Component Value Date   CHOL 145 06/07/2020   Lab Results  Component Value Date   HDL 55 06/07/2020   Lab Results  Component Value Date   LDLCALC 69 06/07/2020   Lab Results  Component Value Date   TRIG 119 06/07/2020   Lab Results  Component Value Date   CHOLHDL 2.6 06/07/2020        Assessment  & Plan:   1. Left otitis media, unspecified otitis media type - azithromycin (ZITHROMAX) 250 MG tablet; Take 2 tablets on day 1, then 1 tablet daily on days 2 through 5  Dispense: 6 tablet; Refill: 0  2. Meniere's disease of both ears - predniSONE (DELTASONE) 50 MG tablet; Take one tablet daily by mouth for 5 days  Dispense: 5 tablet; Refill: 0 - triamcinolone acetonide (KENALOG-40) injection 60 mg    Low sodium diet Take Prednisone 50 mg daily for 5 days Take Z-pack as directed Kenalog 60 mg injection given in office Follow up as needed    Follow-up: PRN  An After Visit Summary was printed and given to the patient.  I, Rip Harbour, NP, have reviewed all documentation for this visit. The documentation on 08/15/21 for the exam, diagnosis, procedures, and orders are all accurate and complete.    Signed, Rip Harbour, NP [facility] [phone]

## 2021-08-15 NOTE — Patient Instructions (Signed)
Low sodium diet Take Prednisone 50 mg daily for 5 days Take Z-pack as directed Kenalog 60 mg injection given in office Follow up as needed   Mnire's Disease Mnire's disease is an inner ear disorder. It causes recurrent attacks of a spinning sensation (vertigo), and ringing in the ear (tinnitus). It also causes hearing loss and a feeling of fullness or pressure in the ear. It typically affects one ear but can affect both. This is a lifelong condition, and it may get worse over time. There are treatment options to help manage the symptoms of Mnire's disease. What are the causes? This condition is caused by having too much of the fluid that is in the inner ear (endolymph). When fluid builds up in the inner ear, it affects the nerves that control balance and hearing. The reason for the fluid buildup is not known. Possible causes include: Allergies. An abnormal reaction of the body's defense system (autoimmune disease). Viral infection of the inner ear. Head injury. What increases the risk? The following factors may make you more likely to develop this condition: Being between 56 and 4 years old. Having a family history of Mnire's disease. Having a history of autoimmune disease. Having an injury to the head (head trauma). What are the signs or symptoms? Symptoms of this condition include: Fullness and pressure in your ear. Roaring or ringing in your ear (tinnitus). Vertigo and loss of balance. Decreased hearing. Nausea and vomiting. This is rare. Symptoms of this condition can come and go and may last for up to 4 or more hours at a time. Symptoms usually start in one ear. They may become more frequent and eventually involve both ears. How is this diagnosed? This condition is diagnosed based on a physical exam and tests. Tests may include: A hearing test (audiogram). An electronystagmogram (ENG). This tests your balance nerve (vestibular nerve). Imaging studies of your inner ear  and hearing nerve, such as an MRI. Other balance tests, such as rotational or balance platform tests. How is this treated? There is no cure for this condition, but treatment can help to manage your symptoms. Treatment may include: A low-salt (low-sodium) diet. This may help to reduce fluid in the body and relieve symptoms. Oral or injected medicines to reduce or control: Vertigo. Nausea. Fluid retention. Use of an air pressure pulse generator. This is a machine that sends small pressure pulses into your ear canal. Injections through the ear drum (tympanic membrane) to control vertigo symptoms. Hearing aids. Inner ear surgery. This is rare. Follow these instructions at home: Eating and drinking Avoid caffeine. Do not drink alcohol. Drink enough fluid to keep your urine pale yellow. Limit the sodium in your diet as told by your health care provider. This is usually no more than 1,500-2,000 mg per day. Check ingredients and nutrition facts on packaged foods and beverages. General instructions Take over-the-counter and prescription medicines only as told by your health care provider. Do not drive if you have vertigo or dizziness. Do not use any products that contain nicotine or tobacco. These products include cigarettes, chewing tobacco, and vaping devices, such as e-cigarettes. If you need help quitting, ask your health care provider. Keep all follow-up visits. This is important. Where to find more information To find more information, advice, and guidance, please see these online sites: American Academy of Otolaryngology-Head and Neck Surgery Foundation: www.enthealth.org [employer] on Deafness and Other Communication Disorders: [employer]: www.american-hearing.org Contact a health care provider if: You have  symptoms that last longer than 4 hours. You have new or worse symptoms. Get help right away if: You have been vomiting for 24  hours. You cannot keep fluids down. You have chest pain or trouble breathing. These symptoms may represent a serious problem that is an emergency. Do not wait to see if the symptoms will go away. Get medical help right away. Call your local emergency services (911 in the [place] Do not drive yourself to the hospital. Summary Mnire's disease is an inner ear disorder. This condition causes recurrent attacks of a spinning sensation (vertigo), ringing in the ear (tinnitus), hearing loss, and ear fullness. Symptoms of this condition can come and go and may last for up to 4 or more hours at a time. Mnire's disease can be treated with a low-sodium diet, medicines, and sometimes, surgery. This information is not intended to replace advice given to you by your health care provider. Make sure you discuss any questions you have with your health care provider. Document Revised: 09/05/2020 Document Reviewed: 09/05/2020 Elsevier Patient Education  2022 [employer].

## 2021-08-16 ENCOUNTER — Other Ambulatory Visit: Payer: No Typology Code available for payment source

## 2021-08-17 ENCOUNTER — Other Ambulatory Visit: Payer: Self-pay | Admitting: Legal Medicine

## 2021-08-17 ENCOUNTER — Other Ambulatory Visit (HOSPITAL_COMMUNITY): Payer: Self-pay

## 2021-08-17 DIAGNOSIS — K279 Peptic ulcer, site unspecified, unspecified as acute or chronic, without hemorrhage or perforation: Secondary | ICD-10-CM

## 2021-08-17 DIAGNOSIS — I2510 Atherosclerotic heart disease of native coronary artery without angina pectoris: Secondary | ICD-10-CM

## 2021-08-17 DIAGNOSIS — F319 Bipolar disorder, unspecified: Secondary | ICD-10-CM

## 2021-08-17 DIAGNOSIS — I10 Essential (primary) hypertension: Secondary | ICD-10-CM

## 2021-08-17 MED ORDER — RANOLAZINE ER 1000 MG PO TB12
ORAL_TABLET | Freq: Two times a day (BID) | ORAL | 3 refills | Status: DC
  Filled 2021-08-17: qty 180, 90d supply, fill #0
  Filled 2021-11-18: qty 180, 90d supply, fill #1

## 2021-08-17 MED ORDER — LITHIUM CARBONATE ER 300 MG PO TBCR
EXTENDED_RELEASE_TABLET | Freq: Two times a day (BID) | ORAL | 2 refills | Status: DC
Start: 2021-08-17 — End: 2022-05-16
  Filled 2021-08-17: qty 180, 90d supply, fill #0
  Filled 2021-08-17 – 2021-11-14 (×2): qty 180, 90d supply, fill #1
  Filled 2022-02-04: qty 180, 90d supply, fill #2

## 2021-08-17 MED ORDER — DULOXETINE HCL 60 MG PO CPEP
ORAL_CAPSULE | Freq: Two times a day (BID) | ORAL | 2 refills | Status: DC
  Filled 2021-08-17: qty 180, 90d supply, fill #0
  Filled 2021-08-17 – 2021-11-14 (×2): qty 180, 90d supply, fill #1
  Filled 2022-02-04: qty 180, 90d supply, fill #2

## 2021-08-17 MED ORDER — LANSOPRAZOLE 30 MG PO CPDR
DELAYED_RELEASE_CAPSULE | ORAL | 2 refills | Status: DC
  Filled 2021-08-17: qty 90, 90d supply, fill #1
  Filled 2021-08-17: qty 90, 90d supply, fill #0
  Filled 2021-11-14: qty 90, 90d supply, fill #1

## 2021-08-17 MED ORDER — LOSARTAN POTASSIUM 100 MG PO TABS
ORAL_TABLET | Freq: Every day | ORAL | 3 refills | Status: DC
  Filled 2021-08-17: qty 45, 90d supply, fill #1
  Filled 2021-08-17: qty 45, 90d supply, fill #0

## 2021-08-18 ENCOUNTER — Other Ambulatory Visit (HOSPITAL_COMMUNITY): Payer: Self-pay

## 2021-08-31 ENCOUNTER — Other Ambulatory Visit: Payer: Self-pay | Admitting: Legal Medicine

## 2021-08-31 DIAGNOSIS — E7800 Pure hypercholesterolemia, unspecified: Secondary | ICD-10-CM

## 2021-08-31 DIAGNOSIS — F411 Generalized anxiety disorder: Secondary | ICD-10-CM

## 2021-09-01 ENCOUNTER — Other Ambulatory Visit: Payer: Self-pay | Admitting: Legal Medicine

## 2021-09-01 ENCOUNTER — Other Ambulatory Visit (HOSPITAL_COMMUNITY): Payer: Self-pay

## 2021-09-01 DIAGNOSIS — F411 Generalized anxiety disorder: Secondary | ICD-10-CM

## 2021-09-01 MED ORDER — ROSUVASTATIN CALCIUM 20 MG PO TABS
ORAL_TABLET | Freq: Every day | ORAL | 2 refills | Status: DC
  Filled 2021-09-01: qty 90, 90d supply, fill #0
  Filled 2021-12-11: qty 90, 90d supply, fill #1

## 2021-09-01 MED ORDER — LORAZEPAM 1 MG PO TABS
1.0000 mg | ORAL_TABLET | Freq: Three times a day (TID) | ORAL | 2 refills | Status: AC | PRN
  Filled 2021-09-01: qty 270, 90d supply, fill #0
  Filled 2022-01-21: qty 270, 90d supply, fill #1

## 2021-09-01 MED ORDER — LORAZEPAM 1 MG PO TABS
ORAL_TABLET | ORAL | 2 refills | Status: DC
  Filled 2021-09-01: qty 180, fill #0
  Filled 2021-09-01: qty 180, 60d supply, fill #0
  Filled ????-??-??: fill #0

## 2021-09-19 ENCOUNTER — Other Ambulatory Visit: Payer: Self-pay | Admitting: Legal Medicine

## 2021-09-19 ENCOUNTER — Other Ambulatory Visit (HOSPITAL_COMMUNITY): Payer: Self-pay

## 2021-09-19 DIAGNOSIS — I10 Essential (primary) hypertension: Secondary | ICD-10-CM

## 2021-09-19 MED ORDER — SEMAGLUTIDE (2 MG/DOSE) 8 MG/3ML ~~LOC~~ SOPN
2.0000 mg | PEN_INJECTOR | SUBCUTANEOUS | 3 refills | Status: DC
  Filled 2021-09-19: qty 3, 28d supply, fill #0

## 2021-09-19 MED ORDER — LOSARTAN POTASSIUM 100 MG PO TABS
100.0000 mg | ORAL_TABLET | Freq: Every day | ORAL | 3 refills | Status: DC
  Filled 2021-09-19: qty 90, 90d supply, fill #0

## 2021-10-02 ENCOUNTER — Other Ambulatory Visit: Payer: Self-pay

## 2021-10-02 ENCOUNTER — Other Ambulatory Visit (HOSPITAL_COMMUNITY): Payer: Self-pay

## 2021-10-02 DIAGNOSIS — I10 Essential (primary) hypertension: Secondary | ICD-10-CM

## 2021-10-02 MED ORDER — LOSARTAN POTASSIUM 100 MG PO TABS
100.0000 mg | ORAL_TABLET | Freq: Every day | ORAL | 3 refills | Status: DC
  Filled 2021-10-02 – 2021-10-17 (×2): qty 90, 90d supply, fill #0

## 2021-10-17 ENCOUNTER — Other Ambulatory Visit (HOSPITAL_COMMUNITY): Payer: Self-pay

## 2021-10-24 ENCOUNTER — Encounter: Payer: Self-pay | Admitting: Nurse Practitioner

## 2021-10-24 ENCOUNTER — Other Ambulatory Visit: Payer: Self-pay | Admitting: Nurse Practitioner

## 2021-10-24 ENCOUNTER — Ambulatory Visit (INDEPENDENT_AMBULATORY_CARE_PROVIDER_SITE_OTHER): Payer: No Typology Code available for payment source | Admitting: Nurse Practitioner

## 2021-10-24 VITALS — BP 112/82 | HR 69 | Temp 97.3°F | Ht 67.5 in | Wt 189.0 lb

## 2021-10-24 DIAGNOSIS — R079 Chest pain, unspecified: Secondary | ICD-10-CM | POA: Diagnosis not present

## 2021-10-24 MED ORDER — KETOROLAC TROMETHAMINE 60 MG/2ML IM SOLN
60.0000 mg | Freq: Once | INTRAMUSCULAR | Status: AC
  Administered 2021-10-24: 60 mg via INTRAMUSCULAR

## 2021-10-24 NOTE — Progress Notes (Signed)
Cancelled.  

## 2021-10-24 NOTE — Patient Instructions (Addendum)
Costochondritis Costochondritis is irritation and swelling (inflammation) of the tissue that connects the ribs to the breastbone (sternum). This tissue is called cartilage. Costochondritis causes pain in the front of the chest. Usually, the pain: Starts slowly. Is in more than one rib. What are the causes? The exact cause of this condition is not always known. It results from stress on the tissue in the affected area. The cause of this stress could be: Chest injury. Exercise or activity, such as lifting. Very bad coughing. What increases the risk? You are more likely to develop this condition if you: Are male. Are 30-40 years old. Recently started a new exercise or work activity. Have low levels of vitamin D. Have a condition that makes you cough often. What are the signs or symptoms? The main symptom of this condition is chest pain. The pain: Usually starts slowly and can be sharp or dull. Gets worse with deep breathing, coughing, or exercise. Gets better with rest. May be worse when you press on the affected area of your ribs and breastbone. How is this treated? This condition usually goes away on its own over time. Your doctor may prescribe an NSAID, such as ibuprofen. This can help reduce pain and inflammation. Treatment may also include: Resting and avoiding activities that make pain worse. Putting heat or ice on the painful area. Doing exercises to stretch your chest muscles. If these treatments do not help, your doctor may inject a numbing medicine to help relieve the pain. Follow these instructions at home: Managing pain, stiffness, and swelling   If told, put ice on the painful area. To do this: Put ice in a plastic bag. Place a towel between your skin and the bag. Leave the ice on for 20 minutes, 2-3 times a day. If told, put heat on the affected area. Do this as often as told by your doctor. Use the heat source that your doctor recommends, such as a moist heat pack or  a heating pad. Place a towel between your skin and the heat source. Leave the heat on for 20-30 minutes. Take off the heat if your skin turns bright red. This is very important if you cannot feel pain, heat, or cold. You may have a greater risk of getting burned. Activity Rest as told by your doctor. Do not do anything that makes your pain worse. This includes any activities that use chest, belly (abdomen), and side muscles. Do not lift anything that is heavier than 10 lb (4.5 kg), or the limit that you are told, until your doctor says that it is safe. Return to your normal activities as told by your doctor. Ask your doctor what activities are safe for you. General instructions Take over-the-counter and prescription medicines only as told by your doctor. Keep all follow-up visits as told by your doctor. This is important. Contact a doctor if: You have chills or a fever. Your pain does not go away or it gets worse. You have a cough that does not go away. Get help right away if: You are short of breath. You have very bad chest pain that is not helped by medicines, heat, or ice. These symptoms may be an emergency. Do not wait to see if the symptoms will go away. Get medical help right away. Call your local emergency services (911 in the [place] Do not drive yourself to the hospital. Summary Costochondritis is irritation and swelling (inflammation) of the tissue that connects the ribs to the breastbone (sternum). This condition   causes pain in the front of the chest. Treatment may include medicines, rest, heat or ice, and exercises. This information is not intended to replace advice given to you by your health care provider. Make sure you discuss any questions you have with your health care provider. Document Revised: 08/14/2019 Document Reviewed: 08/14/2019 Elsevier Patient Education  2022 [employer] Inc[employer]  

## 2021-10-26 ENCOUNTER — Ambulatory Visit: Payer: No Typology Code available for payment source | Admitting: Physician Assistant

## 2021-10-27 ENCOUNTER — Other Ambulatory Visit: Payer: Self-pay | Admitting: Nurse Practitioner

## 2021-10-27 DIAGNOSIS — F319 Bipolar disorder, unspecified: Secondary | ICD-10-CM

## 2021-10-27 DIAGNOSIS — I10 Essential (primary) hypertension: Secondary | ICD-10-CM

## 2021-10-28 LAB — COMPREHENSIVE METABOLIC PANEL
ALT: 26 IU/L (ref 0–44)
AST: 25 IU/L (ref 0–40)
Albumin/Globulin Ratio: 2.1 (ref 1.2–2.2)
Albumin: 4.5 g/dL (ref 3.8–4.9)
Alkaline Phosphatase: 45 IU/L (ref 44–121)
BUN/Creatinine Ratio: 12 (ref 9–20)
BUN: 14 mg/dL (ref 6–24)
Bilirubin Total: 1.1 mg/dL (ref 0.0–1.2)
CO2: 22 mmol/L (ref 20–29)
Calcium: 9 mg/dL (ref 8.7–10.2)
Chloride: 104 mmol/L (ref 96–106)
Creatinine, Ser: 1.2 mg/dL (ref 0.76–1.27)
Globulin, Total: 2.1 g/dL (ref 1.5–4.5)
Glucose: 91 mg/dL (ref 70–99)
Potassium: 4.1 mmol/L (ref 3.5–5.2)
Sodium: 141 mmol/L (ref 134–144)
Total Protein: 6.6 g/dL (ref 6.0–8.5)
eGFR: 70 mL/min/{1.73_m2} (ref >59)

## 2021-10-28 LAB — TSH: TSH: 1.62 u[IU]/mL (ref 0.450–4.500)

## 2021-10-28 LAB — CBC WITH DIFFERENTIAL/PLATELET
Basophils Absolute: 0 10*3/uL (ref 0.0–0.2)
Basos: 1 %
EOS (ABSOLUTE): 0.3 10*3/uL (ref 0.0–0.4)
Eos: 4 %
Hematocrit: 40.1 % (ref 37.5–51.0)
Hemoglobin: 13.9 g/dL (ref 13.0–17.7)
Immature Grans (Abs): 0 10*3/uL (ref 0.0–0.1)
Immature Granulocytes: 0 %
Lymphocytes Absolute: 1.5 10*3/uL (ref 0.7–3.1)
Lymphs: 24 %
MCH: 34.2 pg — ABNORMAL HIGH (ref 26.6–33.0)
MCHC: 34.7 g/dL (ref 31.5–35.7)
MCV: 99 fL — ABNORMAL HIGH (ref 79–97)
Monocytes Absolute: 0.4 10*3/uL (ref 0.1–0.9)
Monocytes: 7 %
Neutrophils Absolute: 4 10*3/uL (ref 1.4–7.0)
Neutrophils: 64 %
Platelets: 162 10*3/uL (ref 150–450)
RBC: 4.06 x10E6/uL — ABNORMAL LOW (ref 4.14–5.80)
RDW: 12.3 % (ref 11.6–15.4)
WBC: 6.3 10*3/uL (ref 3.4–10.8)

## 2021-10-28 LAB — LIPID PANEL
Chol/HDL Ratio: 2.7 ratio (ref 0.0–5.0)
Cholesterol, Total: 134 mg/dL (ref 100–199)
HDL: 50 mg/dL (ref >39)
LDL Chol Calc (NIH): 63 mg/dL (ref 0–99)
Triglycerides: 118 mg/dL (ref 0–149)
VLDL Cholesterol Cal: 21 mg/dL (ref 5–40)

## 2021-10-28 LAB — VITAMIN D 25 HYDROXY (VIT D DEFICIENCY, FRACTURES): Vit D, 25-Hydroxy: 37.4 ng/mL (ref 30.0–100.0)

## 2021-10-28 LAB — LITHIUM LEVEL: Lithium Lvl: 0.9 mmol/L (ref 0.5–1.2)

## 2021-10-28 LAB — CARDIOVASCULAR RISK ASSESSMENT

## 2021-10-30 ENCOUNTER — Encounter: Payer: Self-pay | Admitting: Nurse Practitioner

## 2021-11-06 ENCOUNTER — Other Ambulatory Visit (HOSPITAL_COMMUNITY): Payer: Self-pay

## 2021-11-15 ENCOUNTER — Other Ambulatory Visit (HOSPITAL_COMMUNITY): Payer: Self-pay

## 2021-11-20 ENCOUNTER — Other Ambulatory Visit (HOSPITAL_COMMUNITY): Payer: Self-pay

## 2021-11-21 ENCOUNTER — Other Ambulatory Visit: Payer: Self-pay | Admitting: Legal Medicine

## 2021-11-21 ENCOUNTER — Other Ambulatory Visit (HOSPITAL_COMMUNITY): Payer: Self-pay

## 2021-11-21 DIAGNOSIS — I2510 Atherosclerotic heart disease of native coronary artery without angina pectoris: Secondary | ICD-10-CM

## 2021-11-21 MED ORDER — SEMAGLUTIDE (2 MG/DOSE) 8 MG/3ML ~~LOC~~ SOPN
2.0000 mg | PEN_INJECTOR | SUBCUTANEOUS | 3 refills | Status: DC
  Filled 2021-11-21: qty 3, 28d supply, fill #0

## 2021-11-22 ENCOUNTER — Other Ambulatory Visit (HOSPITAL_COMMUNITY): Payer: Self-pay

## 2021-11-29 ENCOUNTER — Encounter: Payer: Self-pay | Admitting: Legal Medicine

## 2021-11-29 ENCOUNTER — Ambulatory Visit (INDEPENDENT_AMBULATORY_CARE_PROVIDER_SITE_OTHER): Payer: No Typology Code available for payment source | Admitting: Legal Medicine

## 2021-11-29 VITALS — BP 98/60 | HR 57 | Temp 98.3°F | Ht 67.5 in

## 2021-11-29 DIAGNOSIS — R203 Hyperesthesia: Secondary | ICD-10-CM | POA: Diagnosis not present

## 2021-11-29 DIAGNOSIS — L239 Allergic contact dermatitis, unspecified cause: Secondary | ICD-10-CM | POA: Diagnosis not present

## 2021-11-29 HISTORY — DX: Hyperesthesia: R20.3

## 2021-11-29 MED ORDER — GABAPENTIN 100 MG PO CAPS: 100.0000 mg | ORAL_CAPSULE | Freq: Three times a day (TID) | ORAL | 0 refills | Status: DC

## 2021-11-29 NOTE — Progress Notes (Signed)
Established Patient Office Visit  Subjective:  Patient ID: Jon Gonzales, male    DOB: 1961-09-28  Age: 61 y.o. MRN: [id number]  CC:  Chief Complaint  Patient presents with   Neurologic Problem    HPI Jon Gonzales presents for hypersensitivity skin neck and waist for 6 weeks.  Light touch causes pain.  No joint or muscle problems.  Full rom neck and waist.  Past Medical History:  Diagnosis Date   Abscess of buttock 11/23/2020   Angina pectoris ([place]) 10/26/2019   Anxiety, generalized 05/22/2016   Bilateral leg edema 01/29/2020   CAD (coronary artery disease)    Cath 2013 LAD 95% stenosis, D1 90% stenosis, RCA 20%, Circ 30%.  Previous stent to D1 2012.   EF 60%.     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   Other fatigue 10/27/2020   Paranoid reaction, acute ([place]) 05/22/2016   Paronychia of finger, left 08/22/2020   PLMD (periodic limb movement disorder) 09/11/2016   PUD (peptic ulcer disease)    Screening for prostate cancer 05/22/2016   Severe episode of recurrent major depressive disorder, without psychotic features ([place]) 09/11/2016   Snoring 09/11/2016   TIA (transient ischemic attack)     Past Surgical History:  Procedure Laterality Date   CHOLECYSTECTOMY     CORONARY ARTERY BYPASS GRAFT  02/19/12   L - LAD, SVG - D1, SVG - OM   CORONARY STENT INTERVENTION N/A 11/05/2019   Procedure: CORONARY STENT INTERVENTION;  Surgeon: Lorretta Harp, MD;   Location: [facility];  Service: Cardiovascular;  Laterality: N/A;  SVG to DIAG   LEFT HEART CATH AND CORS/GRAFTS ANGIOGRAPHY N/A 11/05/2019   Procedure: LEFT HEART CATH AND CORS/GRAFTS ANGIOGRAPHY;  Surgeon: Lorretta Harp, MD;  Location: [facility];  Service: Cardiovascular;  Laterality: N/A;   RENAL ANGIOGRAPHY N/A 11/05/2019   Procedure: RENAL ANGIOGRAPHY;  Surgeon: Lorretta Harp, MD;  Location: [facility];  Service: Cardiovascular;  Laterality: N/A;   TONSILLECTOMY AND ADENOIDECTOMY     WRIST SURGERY      Family History  Problem Relation Age of Onset   CAD Father 21   Aneurysm Mother 17       cerebral   Heart disease Sister        Unclear   Healthy Brother     Social History   Socioeconomic History   Marital status: Married    Spouse name: Not on file   Number of children: Not on file   Years of education: Not on file   Highest education level: Bachelor's degree (e.g., BA, AB, BS)  Occupational History   Not on file  Tobacco Use   Smoking status: Never   Smokeless tobacco: Never  Vaping Use   Vaping Use: Never used  Substance and Sexual Activity   Alcohol use: Yes    Alcohol/week: 6.0 standard drinks    Types: 6 Cans of  beer per week   Drug use: Not on file   Sexual activity: Not on file  Other Topics Concern   Not on file  Social History Narrative   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     Social Determinants of Health   Financial Resource Strain: Not on file  Food Insecurity: Not on file  Transportation Needs: Not on file  Physical Activity: Not on file  Stress: Not on file  Social Connections: Not on file  Intimate Partner Violence: Not on file    Outpatient Medications Prior to Visit  Medication Sig Dispense Refill   aspirin EC 81 MG tablet Take 1 tablet (81 mg total) by mouth daily. 30 tablet 0   clopidogrel (PLAVIX) 75 MG tablet TAKE 1 TABLET BY MOUTH ONCE DAILY 90 tablet 2    DULoxetine (CYMBALTA) 60 MG capsule TAKE 1 CAPSULE BY MOUTH 2 TIMES DAILY 180 capsule 2   Eszopiclone 3 MG TABS TAKE 1 TABLET BY MOUTH IMMEDIATELY BEFORE BEDTIME AS NEEDED FOR SLEEP 90 tablet 2   fexofenadine (ALLEGRA) 60 MG tablet Take 60 mg by mouth daily.     hydrochlorothiazide (HYDRODIURIL) 25 MG tablet Take 1 tablet (25 mg total) by mouth daily. 90 tablet 2   hydrocortisone (ANUSOL-HC) 25 MG suppository Unwrap and insert 1 suppository (25 mg total) rectally 2 (two) times daily. 12 suppository 0   lansoprazole (PREVACID) 30 MG capsule TAKE 1 CAPSULE BY MOUTH DAILY AT 12 NOON. 90 capsule 2   Lidocaine HCl 4 % GEL Apply 2 mLs topically once as needed for up to 1 dose. 100 mL 3   lithium carbonate (LITHOBID) 300 MG CR tablet TAKE 1 TABLET BY MOUTH TWICE DAILY 180 tablet 2   LORazepam (ATIVAN) 1 MG tablet Take 1 tablet (1 mg total) by mouth every 8 (eight) hours as needed for anxiety. 270 tablet 2   losartan (COZAAR) 100 MG tablet Take 1 tablet (100 mg total) by mouth daily. 90 tablet 3   metoprolol succinate (TOPROL-XL) 100 MG 24 hr tablet TAKE 1 TABLET (100 MG TOTAL) BY MOUTH DAILY. 90 tablet 2   Multiple Vitamin (MULTIVITAMIN WITH MINERALS) TABS tablet Take 1 tablet by mouth daily. Men's Multivitamin     nitroGLYCERIN (NITROSTAT) 0.4 MG SL tablet Dissolve 1 tablet (0.4 mg total) under the tongue every 5 (five) minutes up to 3 doses as needed for chest pain. If no relief after 3 doses call 911. 25 tablet 1   ranolazine (RANEXA) 1000 MG SR tablet TAKE 1 TABLET BY MOUTH TWO TIMES DAILY 180 tablet 3   rosuvastatin (CRESTOR) 20 MG tablet TAKE 1 TABLET BY MOUTH AT BEDTIME 90 tablet 2   Semaglutide, 2 MG/DOSE, 8 MG/3ML SOPN Inject 2 mg subcutaneously once a week as directed. 12 mL 3   No facility-administered medications prior to visit.    Allergies  Allergen Reactions   Trintellix [Vortioxetine] Other (See Comments)    Headaches.   Penicillins Rash    Did it involve swelling of the  face/tongue/throat, SOB, or low BP? Unknown Did it involve sudden or severe rash/hives, skin peeling, or any reaction on the inside of your mouth or nose? Unknown Did you need to seek medical attention at a hospital or doctor's office? Unknown When did it last happen? childhood reaction      If all above answers are NO, may proceed with cephalosporin use.     ROS Review of Systems  Constitutional:  Negative for activity change and appetite change.  HENT:  Negative for congestion.   Eyes:  Negative for visual disturbance.  Respiratory:  Negative for chest tightness and shortness of breath.   Endocrine: Negative for polyuria.  Genitourinary:  Negative for difficulty urinating.  Neurological:        Skin hypersensitivity neck and waist     Objective:    Physical Exam Vitals reviewed.  Constitutional:      General: He is in acute distress.     Appearance: Normal appearance.  HENT:     Head: Normocephalic.     Right Ear: Tympanic membrane normal.     Left Ear: Tympanic membrane normal.  Eyes:     Extraocular Movements: Extraocular movements intact.     Conjunctiva/sclera: Conjunctivae normal.     Pupils: Pupils are equal, round, and reactive to light.  Neck:     Vascular: No carotid bruit.  Cardiovascular:     Rate and Rhythm: Normal rate and regular rhythm.     Pulses: Normal pulses.  Pulmonary:     Effort: Pulmonary effort is normal.     Breath sounds: Normal breath sounds.  Musculoskeletal:     Cervical back: Normal range of motion. No rigidity or tenderness.     Right lower leg: No edema.     Left lower leg: No edema.  Lymphadenopathy:     Cervical: No cervical adenopathy.  Skin:    General: Skin is warm.     Capillary Refill: Capillary refill takes less than 2 seconds.  Neurological:     General: No focal deficit present.     Mental Status: He is alert. Mental status is at baseline.     Sensory: Sensory deficit (neck and waist) present.    BP 98/60 (Patient  Position: Standing)    Pulse (!) 57    Temp 98.3 F (36.8 C)    Ht '5\' 10"'  (1.778 m)    SpO2 98%    BMI 27.12 kg/m  Wt Readings from Last 3 Encounters:  10/24/21 189 lb (85.7 kg)  08/03/21 188 lb (85.3 kg)  04/13/21 213 lb (96.6 kg)     Health Maintenance Due  Topic Date Due   HIV Screening  Never done   Hepatitis C Screening  Never done   TETANUS/TDAP  Never done   Zoster Vaccines- Shingrix (1 of 2) Never done    There are no preventive care reminders to display for this patient.  Lab Results  Component Value Date   TSH 1.620 10/27/2021   Lab Results  Component Value Date   WBC 6.3 10/27/2021   HGB 13.9 10/27/2021   HCT 40.1 10/27/2021   MCV 99 (H) 10/27/2021   PLT 162 10/27/2021   Lab Results  Component Value Date   NA 141 10/27/2021   K 4.1 10/27/2021   CO2 22 10/27/2021   GLUCOSE 91 10/27/2021   BUN 14 10/27/2021   CREATININE 1.20 10/27/2021   BILITOT 1.1 10/27/2021   ALKPHOS 45 10/27/2021   AST 25 10/27/2021   ALT 26 10/27/2021   PROT 6.6 10/27/2021   ALBUMIN 4.5 10/27/2021   CALCIUM 9.0 10/27/2021   EGFR 70 10/27/2021   Lab Results  Component Value Date   CHOL 134 10/27/2021   Lab Results  Component Value Date   HDL 50 10/27/2021   Lab Results  Component Value Date   LDLCALC 63 10/27/2021   Lab Results  Component Value Date   TRIG  118 10/27/2021   Lab Results  Component Value Date   CHOLHDL 2.7 10/27/2021   No results found for: HGBA1C    Assessment & Plan:   Diagnoses and all orders for this visit: Cutaneous hypersensitivity -     gabapentin (NEURONTIN) 100 MG capsule; Take 1 capsule (100 mg total) by mouth 3 (three) times daily. Hyperesthesia Sensory sensitivity around posterior neck and waist area for 6 weeks.  Start gabapentin      Follow-up: Return if symptoms worsen or fail to improve.    Reinaldo Meeker, MD

## 2021-12-04 ENCOUNTER — Other Ambulatory Visit: Payer: Self-pay

## 2021-12-04 DIAGNOSIS — R42 Dizziness and giddiness: Secondary | ICD-10-CM

## 2021-12-05 ENCOUNTER — Ambulatory Visit (INDEPENDENT_AMBULATORY_CARE_PROVIDER_SITE_OTHER): Payer: No Typology Code available for payment source

## 2021-12-05 ENCOUNTER — Other Ambulatory Visit: Payer: Self-pay

## 2021-12-05 DIAGNOSIS — R5383 Other fatigue: Secondary | ICD-10-CM | POA: Diagnosis not present

## 2021-12-05 LAB — COMPREHENSIVE METABOLIC PANEL
ALT: 20 IU/L (ref 0–44)
AST: 18 IU/L (ref 0–40)
Albumin/Globulin Ratio: 2.3 — ABNORMAL HIGH (ref 1.2–2.2)
Albumin: 4.3 g/dL (ref 3.8–4.9)
Alkaline Phosphatase: 42 IU/L — ABNORMAL LOW (ref 44–121)
BUN/Creatinine Ratio: 13 (ref 9–20)
BUN: 13 mg/dL (ref 6–24)
Bilirubin Total: 0.6 mg/dL (ref 0.0–1.2)
CO2: 25 mmol/L (ref 20–29)
Calcium: 8.9 mg/dL (ref 8.7–10.2)
Chloride: 101 mmol/L (ref 96–106)
Creatinine, Ser: 1.04 mg/dL (ref 0.76–1.27)
Globulin, Total: 1.9 g/dL (ref 1.5–4.5)
Glucose: 85 mg/dL (ref 70–99)
Potassium: 3.7 mmol/L (ref 3.5–5.2)
Sodium: 140 mmol/L (ref 134–144)
Total Protein: 6.2 g/dL (ref 6.0–8.5)
eGFR: 83 mL/min/{1.73_m2} (ref >59)

## 2021-12-05 LAB — CBC WITH DIFF/PLATELET
Basophils Absolute: 0 10*3/uL (ref 0.0–0.2)
Basos: 1 %
EOS (ABSOLUTE): 0.3 10*3/uL (ref 0.0–0.4)
Eos: 4 %
Hematocrit: 36.8 % — ABNORMAL LOW (ref 37.5–51.0)
Hemoglobin: 12.7 g/dL — ABNORMAL LOW (ref 13.0–17.7)
Immature Grans (Abs): 0 10*3/uL (ref 0.0–0.1)
Immature Granulocytes: 1 %
Lymphocytes Absolute: 1.7 10*3/uL (ref 0.7–3.1)
Lymphs: 27 %
MCH: 34.1 pg — ABNORMAL HIGH (ref 26.6–33.0)
MCHC: 34.5 g/dL (ref 31.5–35.7)
MCV: 99 fL — ABNORMAL HIGH (ref 79–97)
Monocytes Absolute: 0.5 10*3/uL (ref 0.1–0.9)
Monocytes: 8 %
Neutrophils Absolute: 3.8 10*3/uL (ref 1.4–7.0)
Neutrophils: 59 %
Platelets: 149 10*3/uL — ABNORMAL LOW (ref 150–450)
RBC: 3.72 x10E6/uL — ABNORMAL LOW (ref 4.14–5.80)
RDW: 12.1 % (ref 11.6–15.4)
WBC: 6.3 10*3/uL (ref 3.4–10.8)

## 2021-12-05 LAB — POC COVID19 BINAXNOW: SARS Coronavirus 2 Ag: NEGATIVE

## 2021-12-05 NOTE — Progress Notes (Signed)
Patient Name: Jon Gonzales Date of Birth: 1961-10-09 MRN:  [id number]  Jon Gonzales is a 61 y.o. yo male presenting for COVID-19 testing.    Jon Gonzales is being tested due to extreme fatigue.  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  Results for orders placed or performed in visit on 12/05/21 (from the past 24 hour(s))  POC COVID-19     Status: Normal   Collection Time: 12/05/21 12:58 PM  Result Value Ref Range   SARS Coronavirus 2 Ag Negative Negative    Patient is aware of the results.   Jacklynn Bue, LPN 00:17 PM

## 2021-12-05 NOTE — Patient Instructions (Signed)
Results for orders placed or performed in visit on 12/05/21 (from the past 24 hour(s))  POC COVID-19     Status: Normal   Collection Time: 12/05/21 12:58 PM  Result Value Ref Range   SARS Coronavirus 2 Ag Negative Negative

## 2021-12-11 ENCOUNTER — Other Ambulatory Visit (HOSPITAL_COMMUNITY): Payer: Self-pay

## 2021-12-12 ENCOUNTER — Other Ambulatory Visit (HOSPITAL_COMMUNITY): Payer: Self-pay

## 2021-12-12 ENCOUNTER — Other Ambulatory Visit: Payer: Self-pay | Admitting: Legal Medicine

## 2021-12-12 DIAGNOSIS — Z6827 Body mass index (BMI) 27.0-27.9, adult: Secondary | ICD-10-CM

## 2021-12-12 DIAGNOSIS — E663 Overweight: Secondary | ICD-10-CM

## 2021-12-12 DIAGNOSIS — I2510 Atherosclerotic heart disease of native coronary artery without angina pectoris: Secondary | ICD-10-CM

## 2021-12-12 DIAGNOSIS — L239 Allergic contact dermatitis, unspecified cause: Secondary | ICD-10-CM

## 2021-12-12 MED ORDER — CLOPIDOGREL BISULFATE 75 MG PO TABS
ORAL_TABLET | Freq: Every day | ORAL | 2 refills | Status: DC
  Filled 2021-12-12: qty 90, 90d supply, fill #0
  Filled 2022-03-05: qty 90, 90d supply, fill #1

## 2021-12-12 MED ORDER — SEMAGLUTIDE-WEIGHT MANAGEMENT 1.7 MG/0.75ML ~~LOC~~ SOAJ
1.7000 mg | SUBCUTANEOUS | 3 refills | Status: DC
  Filled 2021-12-12: qty 3, 28d supply, fill #0

## 2021-12-12 MED ORDER — GABAPENTIN 100 MG PO CAPS
100.0000 mg | ORAL_CAPSULE | Freq: Three times a day (TID) | ORAL | 2 refills | Status: DC
  Filled 2021-12-12 – 2021-12-23 (×2): qty 270, 90d supply, fill #0

## 2021-12-13 ENCOUNTER — Other Ambulatory Visit: Payer: Self-pay

## 2021-12-13 ENCOUNTER — Other Ambulatory Visit (HOSPITAL_COMMUNITY): Payer: Self-pay

## 2021-12-13 DIAGNOSIS — E663 Overweight: Secondary | ICD-10-CM

## 2021-12-13 DIAGNOSIS — Z6827 Body mass index (BMI) 27.0-27.9, adult: Secondary | ICD-10-CM

## 2021-12-13 MED ORDER — SEMAGLUTIDE-WEIGHT MANAGEMENT 2.4 MG/0.75ML ~~LOC~~ SOAJ
2.4000 mg | SUBCUTANEOUS | 2 refills | Status: DC
  Filled 2021-12-13: qty 3, 28d supply, fill #0
  Filled 2022-01-21 – 2022-01-22 (×2): qty 3, 28d supply, fill #1

## 2021-12-14 ENCOUNTER — Other Ambulatory Visit (HOSPITAL_COMMUNITY): Payer: Self-pay

## 2021-12-19 ENCOUNTER — Ambulatory Visit (INDEPENDENT_AMBULATORY_CARE_PROVIDER_SITE_OTHER): Payer: No Typology Code available for payment source | Admitting: Legal Medicine

## 2021-12-19 ENCOUNTER — Encounter: Payer: Self-pay | Admitting: Legal Medicine

## 2021-12-19 VITALS — BP 92/70 | HR 67 | Temp 98.7°F

## 2021-12-19 DIAGNOSIS — I9589 Other hypotension: Secondary | ICD-10-CM

## 2021-12-19 DIAGNOSIS — E861 Hypovolemia: Secondary | ICD-10-CM | POA: Diagnosis not present

## 2021-12-19 DIAGNOSIS — R42 Dizziness and giddiness: Secondary | ICD-10-CM | POA: Diagnosis not present

## 2021-12-19 LAB — METHYLMALONIC ACID, SERUM: Methylmalonic Acid: 123 nmol/L (ref 0–378)

## 2021-12-19 LAB — SPECIMEN STATUS REPORT

## 2021-12-19 LAB — B12 AND FOLATE PANEL
Folate: >20 ng/mL (ref >3.0)
Vitamin B-12: 546 pg/mL (ref 232–1245)

## 2021-12-19 NOTE — Progress Notes (Signed)
Acute Office Visit  Subjective:    Patient ID: Jon Gonzales, male    DOB: 05/04/1961, 61 y.o.   MRN: [id number]  Chief Complaint  Patient presents with   Dizziness    HPI Patient is in today for sudden onset of dizziness and weakness while working.  His blood pressure medicines have be tapered due to hypotension.  He did not eat lunch and his originally BP was 92/70 sitting.  He was put in trendeleberg and BP increased to 120/80, BS 86. He ate some crackers and felt better, no dysthymia, no chest pain.  This started when put on ranexa.  He improved and allowed to go home.  Past Medical History:  Diagnosis Date   Abscess of buttock 11/23/2020   Angina pectoris ([place]) 10/26/2019   Anxiety, generalized 05/22/2016   Bilateral leg edema 01/29/2020   CAD (coronary artery disease)    Cath 2013 LAD 95% stenosis, D1 90% stenosis, RCA 20%, Circ 30%.  Previous stent to D1 2012.   EF 60%.     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   Other fatigue 10/27/2020   Paranoid reaction, acute ([place]) 05/22/2016   Paronychia of finger, left 08/22/2020   PLMD (periodic limb movement disorder) 09/11/2016   PUD (peptic ulcer disease)    Screening for prostate cancer 05/22/2016   Severe episode of recurrent major depressive disorder, without psychotic features ([place]) 09/11/2016   Snoring 09/11/2016   TIA (transient ischemic attack)     Past Surgical History:  Procedure Laterality Date    CHOLECYSTECTOMY     CORONARY ARTERY BYPASS GRAFT  02/19/12   L - LAD, SVG - D1, SVG - OM   CORONARY STENT INTERVENTION N/A 11/05/2019   Procedure: CORONARY STENT INTERVENTION;  Surgeon: Lorretta Harp, MD;  Location: [facility];  Service: Cardiovascular;  Laterality: N/A;  SVG to DIAG   LEFT HEART CATH AND CORS/GRAFTS ANGIOGRAPHY N/A 11/05/2019   Procedure: LEFT HEART CATH AND CORS/GRAFTS ANGIOGRAPHY;  Surgeon: Lorretta Harp, MD;  Location: [facility];  Service: Cardiovascular;  Laterality: N/A;   RENAL ANGIOGRAPHY N/A 11/05/2019   Procedure: RENAL ANGIOGRAPHY;  Surgeon: Lorretta Harp, MD;  Location: [facility];  Service: Cardiovascular;  Laterality: N/A;   TONSILLECTOMY AND ADENOIDECTOMY     WRIST SURGERY      Family History  Problem Relation Age of Onset   CAD Father 51   Aneurysm Mother 14       cerebral   Heart disease Sister        Unclear   Healthy Brother     Social History   Socioeconomic History   Marital status: Married    Spouse name: Not on file   Number of children: Not on file   Years of education: Not on file   Highest education level: Bachelor's degree (e.g., BA, AB, BS)  Occupational History   Not on file  Tobacco Use   Smoking status: Never   Smokeless tobacco: Never  Vaping Use   Vaping Use: Never used  Substance and Sexual Activity   Alcohol use: Yes    Alcohol/week: 6.0 standard drinks    Types: 6 Cans of beer per week   Drug use: Not on file   Sexual activity: Not on file  Other Topics Concern   Not on file  Social History Narrative   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     Social Determinants of Health   Financial Resource Strain: Not on file  Food Insecurity: Not on file  Transportation Needs: Not on file  Physical Activity: Not on file  Stress: Not on file  Social Connections: Not on file  Intimate Partner Violence: Not on file    Outpatient Medications Prior to  Visit  Medication Sig Dispense Refill   aspirin EC 81 MG tablet Take 1 tablet (81 mg total) by mouth daily. 30 tablet 0   clopidogrel (PLAVIX) 75 MG tablet TAKE 1 TABLET BY MOUTH ONCE DAILY 90 tablet 2   DULoxetine (CYMBALTA) 60 MG capsule TAKE 1 CAPSULE BY MOUTH 2 TIMES DAILY 180 capsule 2   Eszopiclone 3 MG TABS TAKE 1 TABLET BY MOUTH IMMEDIATELY BEFORE BEDTIME AS NEEDED FOR SLEEP 90 tablet 2   fexofenadine (ALLEGRA) 60 MG tablet Take 60 mg by mouth daily.     gabapentin (NEURONTIN) 100 MG capsule Take 1 capsule by mouth 3 times daily. 270 capsule 2   hydrochlorothiazide (HYDRODIURIL) 25 MG tablet Take 1 tablet (25 mg total) by mouth daily. 90 tablet 2   hydrocortisone (ANUSOL-HC) 25 MG suppository Unwrap and insert 1 suppository (25 mg total) rectally 2 (two) times daily. 12 suppository 0   lansoprazole (PREVACID) 30 MG capsule TAKE 1 CAPSULE BY MOUTH DAILY AT 12 NOON. 90 capsule 2   Lidocaine HCl 4 % GEL Apply 2 mLs topically once as needed for up to 1 dose. 100 mL 3   lithium carbonate (LITHOBID) 300 MG CR tablet TAKE 1 TABLET BY MOUTH TWICE DAILY 180 tablet 2   LORazepam (ATIVAN) 1 MG tablet Take 1 tablet (1 mg total) by mouth every 8 (eight) hours as needed for anxiety. 270 tablet 2   losartan (COZAAR) 100 MG tablet Take 1 tablet (100 mg total) by mouth daily. 90 tablet 3   metoprolol succinate (TOPROL-XL) 100 MG 24 hr tablet TAKE 1 TABLET (100 MG TOTAL) BY MOUTH DAILY. 90 tablet 2   Multiple Vitamin (MULTIVITAMIN WITH MINERALS) TABS tablet Take 1 tablet by mouth daily. Men's Multivitamin     nitroGLYCERIN (NITROSTAT) 0.4 MG SL tablet Dissolve 1 tablet (0.4 mg total) under the tongue every 5 (five) minutes up to 3 doses as needed for chest pain. If no relief after 3 doses call 911. 25 tablet 1   ranolazine (RANEXA) 1000 MG SR tablet TAKE 1 TABLET BY MOUTH TWO TIMES DAILY 180 tablet 3   rosuvastatin (CRESTOR) 20 MG tablet TAKE 1 TABLET BY MOUTH AT BEDTIME 90 tablet 2   Semaglutide-Weight  Management 2.4 MG/0.75ML SOAJ Inject 2.4 mg into the skin once a week. 3 mL 2   No facility-administered medications prior to visit.    Allergies  Allergen Reactions   Trintellix [Vortioxetine] Other (See Comments)    Headaches.   Penicillins Rash    Did it involve swelling of the face/tongue/throat, SOB, or low BP? Unknown Did it involve sudden  or severe rash/hives, skin peeling, or any reaction on the inside of your mouth or nose? Unknown Did you need to seek medical attention at a hospital or doctor's office? Unknown When did it last happen? childhood reaction      If all above answers are NO, may proceed with cephalosporin use.     Review of Systems  Constitutional:  Positive for fatigue. Negative for activity change and appetite change.  Respiratory: Negative.    Cardiovascular: Negative.   Endocrine: Negative.   Genitourinary: Negative.   Neurological:  Positive for dizziness and weakness.      Objective:    Physical Exam Vitals reviewed.  Constitutional:      Appearance: He is ill-appearing.     Comments: Patient has sallow complexion and some sweating.  Improved in trendeleberg.  Cardiovascular:     Rate and Rhythm: Normal rate and regular rhythm.     Pulses: Normal pulses.     Heart sounds: Normal heart sounds. No murmur heard.   No gallop.  Pulmonary:     Effort: Pulmonary effort is normal. No respiratory distress.     Breath sounds: Normal breath sounds. No wheezing.  Neurological:     General: No focal deficit present.     Mental Status: He is alert.     Comments: Some tremor in hands    BP 92/70    Pulse 67    Temp 98.7 F (37.1 C)  Wt Readings from Last 3 Encounters:  10/24/21 189 lb (85.7 kg)  08/03/21 188 lb (85.3 kg)  04/13/21 213 lb (96.6 kg)    Health Maintenance Due  Topic Date Due   HIV Screening  Never done   Hepatitis C Screening  Never done   TETANUS/TDAP  Never done   Zoster Vaccines- Shingrix (1 of 2) Never done    There are  no preventive care reminders to display for this patient.   Lab Results  Component Value Date   TSH 1.620 10/27/2021   Lab Results  Component Value Date   WBC 6.3 12/04/2021   HGB 12.7 (L) 12/04/2021   HCT 36.8 (L) 12/04/2021   MCV 99 (H) 12/04/2021   PLT 149 (L) 12/04/2021   Lab Results  Component Value Date   NA 140 12/04/2021   K 3.7 12/04/2021   CO2 25 12/04/2021   GLUCOSE 85 12/04/2021   BUN 13 12/04/2021   CREATININE 1.04 12/04/2021   BILITOT 0.6 12/04/2021   ALKPHOS 42 (L) 12/04/2021   AST 18 12/04/2021   ALT 20 12/04/2021   PROT 6.2 12/04/2021   ALBUMIN 4.3 12/04/2021   CALCIUM 8.9 12/04/2021   EGFR 83 12/04/2021   Lab Results  Component Value Date   CHOL 134 10/27/2021   Lab Results  Component Value Date   HDL 50 10/27/2021   Lab Results  Component Value Date   LDLCALC 63 10/27/2021   Lab Results  Component Value Date   TRIG 118 10/27/2021   Lab Results  Component Value Date   CHOLHDL 2.7 10/27/2021   No results found for: HGBA1C     Assessment & Plan:   Problem List Items Addressed This Visit       Cardiovascular and Mediastinum   Hypotension - Primary Patient's original BO 92 systolic.  Better lying in trendeleberg and after eating some crackers.  He resumed work, need to discuss eating reagular meals and possibly stopping ranexa.     Other   Dizziness He has had recurrent  vertigo, BP medicines held. May need to change ranexa        Reinaldo Meeker, MD

## 2021-12-20 ENCOUNTER — Other Ambulatory Visit (HOSPITAL_COMMUNITY): Payer: Self-pay

## 2021-12-23 ENCOUNTER — Other Ambulatory Visit (HOSPITAL_COMMUNITY): Payer: Self-pay

## 2021-12-25 ENCOUNTER — Other Ambulatory Visit (HOSPITAL_COMMUNITY): Payer: Self-pay

## 2022-01-07 ENCOUNTER — Other Ambulatory Visit (HOSPITAL_COMMUNITY): Payer: Self-pay

## 2022-01-07 ENCOUNTER — Other Ambulatory Visit: Payer: Self-pay | Admitting: Legal Medicine

## 2022-01-07 DIAGNOSIS — I10 Essential (primary) hypertension: Secondary | ICD-10-CM

## 2022-01-07 MED ORDER — HYDROCHLOROTHIAZIDE 25 MG PO TABS
25.0000 mg | ORAL_TABLET | Freq: Every day | ORAL | 2 refills | Status: DC
  Filled 2022-01-07: qty 90, 90d supply, fill #0

## 2022-01-08 ENCOUNTER — Other Ambulatory Visit (HOSPITAL_COMMUNITY): Payer: Self-pay

## 2022-01-14 ENCOUNTER — Other Ambulatory Visit: Payer: Self-pay | Admitting: Legal Medicine

## 2022-01-14 DIAGNOSIS — F5101 Primary insomnia: Secondary | ICD-10-CM

## 2022-01-15 ENCOUNTER — Other Ambulatory Visit (HOSPITAL_COMMUNITY): Payer: Self-pay

## 2022-01-15 MED ORDER — ESZOPICLONE 3 MG PO TABS
ORAL_TABLET | ORAL | 2 refills | Status: DC
  Filled 2022-01-15: qty 90, 90d supply, fill #0
  Filled 2022-05-03: qty 90, 90d supply, fill #1
  Filled 2022-05-03: qty 90, 90d supply, fill #2

## 2022-01-17 ENCOUNTER — Other Ambulatory Visit (HOSPITAL_COMMUNITY): Payer: Self-pay

## 2022-01-17 ENCOUNTER — Ambulatory Visit (INDEPENDENT_AMBULATORY_CARE_PROVIDER_SITE_OTHER): Payer: No Typology Code available for payment source | Admitting: Nurse Practitioner

## 2022-01-17 ENCOUNTER — Encounter: Payer: Self-pay | Admitting: Nurse Practitioner

## 2022-01-17 VITALS — BP 106/82 | HR 97 | Temp 98.5°F | Ht 67.5 in | Wt 177.2 lb

## 2022-01-17 DIAGNOSIS — Z951 Presence of aortocoronary bypass graft: Secondary | ICD-10-CM | POA: Diagnosis not present

## 2022-01-17 DIAGNOSIS — I208 Other forms of angina pectoris: Secondary | ICD-10-CM

## 2022-01-17 DIAGNOSIS — F332 Major depressive disorder, recurrent severe without psychotic features: Secondary | ICD-10-CM | POA: Diagnosis not present

## 2022-01-17 DIAGNOSIS — Z87898 Personal history of other specified conditions: Secondary | ICD-10-CM

## 2022-01-17 DIAGNOSIS — I952 Hypotension due to drugs: Secondary | ICD-10-CM | POA: Diagnosis not present

## 2022-01-17 DIAGNOSIS — K219 Gastro-esophageal reflux disease without esophagitis: Secondary | ICD-10-CM

## 2022-01-17 MED ORDER — PANTOPRAZOLE SODIUM 40 MG PO TBEC
40.0000 mg | DELAYED_RELEASE_TABLET | Freq: Every day | ORAL | 3 refills | Status: DC
  Filled 2022-01-17: qty 30, 30d supply, fill #0

## 2022-01-17 MED ORDER — PANTOPRAZOLE SODIUM 40 MG PO TBEC: 40.0000 mg | DELAYED_RELEASE_TABLET | Freq: Every day | ORAL | 3 refills | Status: DC

## 2022-01-17 NOTE — Patient Instructions (Addendum)
Hypotension °As your heart beats, it forces blood through your body. This force is called blood pressure. If you have hypotension, you have low blood pressure.  °When your blood pressure is too low, you may not get enough blood to your brain or other parts of your body. This may cause you to feel weak, light-headed, have a fast heartbeat, or even faint. Low blood pressure may be harmless, or it may cause serious problems. °What are the causes? °Blood loss. °Not enough water in the body (dehydration). °Heart problems. °Hormone problems. °Pregnancy. °A very bad infection. °Not having enough of certain nutrients. °Very bad allergic reactions. °Certain medicines. °What increases the risk? °Age. The risk increases as you get older. °Conditions that affect the heart or the brain and spinal cord (central nervous system). °What are the signs or symptoms? °Feeling: °Weak. °Light-headed. °Dizzy. °Tired (fatigued). °Blurred vision. °Fast heartbeat. °Fainting, in very bad cases. °How is this treated? °Changing your diet. This may involve drinking more water or including more salt (sodium) in your diet by eating high-salt foods. °Taking medicines to raise your blood pressure. °Changing how much you take (the dosage) of some of your medicines. °Wearing compression stockings. These stockings help to prevent blood clots and reduce swelling in your legs. °In some cases, you may need to go to the hospital to: °Receive fluids through an IV tube. °Receive donated blood through an IV tube (transfusion). °Get treated for an infection or heart problems, if this applies. °Be monitored while medicines that you are taking wear off. °Follow these instructions at home: °Eating and drinking ° °Drink enough fluids to keep your pee (urine) pale yellow. °Eat a healthy diet. Follow instructions from your doctor about what you can eat or drink. A healthy diet includes: °Fresh fruits and vegetables. °Whole grains. °Low-fat (lean) meats. °Low-fat  dairy products. °If told, include more salt in your diet. Do not add extra salt to your diet unless your doctor tells you to. °Eat small meals often. °Avoid standing up quickly after you eat. °Medicines °Take over-the-counter and prescription medicines only as told by your doctor. °Follow instructions from your doctor about changing how much you take of your medicines, if this applies. °Do not stop or change any of your medicines on your own. °General instructions ° °Wear compression stockings as told by your doctor. °Get up slowly from lying down or sitting. °Avoid hot showers and a lot of heat as told by your doctor. °Return to your normal activities when your doctor says that it is safe. °Do not smoke or use any products that contain nicotine or tobacco. If you need help quitting, ask your doctor. °Keep all follow-up visits. °Contact a doctor if: °You vomit. °You have watery poop (diarrhea). °You have a fever for more than 2-3 days. °You feel more thirsty than normal. °You feel weak and tired. °Get help right away if: °You have chest pain. °You have a fast or uneven heartbeat. °You lose feeling (have numbness) in any part of your body. °You cannot move your arms or your legs. °You have trouble talking. °You get sweaty or feel light-headed. °You faint. °You have trouble breathing. °You have trouble staying awake. °You feel mixed up (confused). °These symptoms may be an emergency. Get help right away. Call 911. °Do not wait to see if the symptoms will go away. °Do not drive yourself to the hospital. °Summary °Hypotension is also called low blood pressure. It is when the force of blood pumping through your body   is too weak. °Hypotension may be harmless, or it may cause serious problems. °Treatment may include changing your diet and medicines, and wearing compression stockings. °In very bad cases, you may need to go to the hospital. °This information is not intended to replace advice given to you by your health care  provider. Make sure you discuss any questions you have with your health care provider. °Document Revised: 05/22/2021 Document Reviewed: 05/22/2021 °Elsevier Patient Education © 2022 [employer] ° °

## 2022-01-17 NOTE — Addendum Note (Signed)
Addended by: Janie Morning on: 01/17/2022 10:13 PM ? ? Modules accepted: Orders ? ?

## 2022-01-17 NOTE — Progress Notes (Addendum)
Acute Office Visit  Subjective:    Patient ID: Jon Gonzales, male    DOB: 05/27/1961, 61 y.o.   MRN: [id number]  Chief Complaint  Patient presents with  . Chest Pain    HPI: Patient is in today for chest pain and low blood pressure. Last night about 7 pm, pt had just gotten home and developed chest pain. Pt walked up the hill from mailbox and had chest pain. 7/10. Radiated to left arm. His wife gave him 4 baby aspirin and one ntg. Improved to 3/10 and gradually resolved. His wife checked his bp at that time and discovered it was 86/50, pulse 65. No more ntg was given. His wife continued to check bp every 15 minutes while having him drink 2 bottles of water. Remained upper 80s-96/60s. Pulse was normal. She elevated his legs also. No nausea, but did get sob.The patient had restarted losartan 100 mg once daily, hctz 25 mg once daily, and ranexa 1000 mg one twice a day after being off them for 2-3 weeks. Report he has experienced increased work stress the past few months.   Last week had chest pain when walking up hill to mammogram bus.   Pt is followed by Dr Servando Salina, cardiology.    Past Medical History:  Diagnosis Date  . Abscess of buttock 11/23/2020  . Angina pectoris (HCC) 10/26/2019  . Anxiety, generalized 05/22/2016  . Bilateral leg edema 01/29/2020  . CAD (coronary artery disease)    Cath 2013 LAD 95% stenosis, D1 90% stenosis, RCA 20%, Circ 30%.  Previous stent to D1 2012.   EF 60%.    . Class 2 obesity due to excess calories without serious comorbidity in adult 09/11/2016  . Confusional arousals 09/11/2016  . Dehydration 11/07/2020  . Depression   . Dizziness 10/27/2020  . Excessive daytime sleepiness 09/11/2016  . Herpes zoster 01/20/2020  . HTN (hypertension)   . Hypercholesterolemia 05/22/2016  . Hyperlipidemia   . Hypertension 05/22/2016  . Hypogonadism in male 05/22/2016  . Hypotension 11/07/2020  . Low testosterone   . Nephrolithiasis   . OSA (obstructive sleep apnea) 09/11/2016   . OSA on CPAP 01/03/2017  . 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 Other fatigue 10/27/2020  . Paranoid reaction, acute (HCC) 05/22/2016  . Paronychia of finger, left 08/22/2020  . PLMD (periodic limb movement disorder) 09/11/2016  . PUD (peptic ulcer disease)   . Screening for prostate cancer 05/22/2016  . Severe episode of recurrent major depressive disorder, without psychotic features (HCC) 09/11/2016  . Snoring 09/11/2016  . TIA (transient ischemic attack)     Past Surgical History:  Procedure Laterality Date  . CHOLECYSTECTOMY    . CORONARY ARTERY BYPASS GRAFT  02/19/12   L - LAD, SVG - D1, SVG - OM  . CORONARY STENT INTERVENTION N/A 11/05/2019   Procedure: CORONARY STENT INTERVENTION;  Surgeon: Runell Gess, MD;  Location: [facility];  Service: Cardiovascular;  Laterality: N/A;  SVG to DIAG  . LEFT HEART CATH AND CORS/GRAFTS ANGIOGRAPHY N/A 11/05/2019   Procedure: LEFT HEART CATH AND CORS/GRAFTS ANGIOGRAPHY;  Surgeon: Runell Gess, MD;  Location: [facility];  Service: Cardiovascular;  Laterality: N/A;  . RENAL ANGIOGRAPHY N/A 11/05/2019   Procedure: RENAL ANGIOGRAPHY;  Surgeon: Runell Gess, MD;  Location: [facility];  Service: Cardiovascular;  Laterality: N/A;  . TONSILLECTOMY AND ADENOIDECTOMY    . WRIST SURGERY      Family History  Problem Relation Age of Onset  . CAD Father 92  . Aneurysm Mother 66       cerebral  . Heart disease Sister        Unclear  . Healthy Brother     Social History   Socioeconomic History  . Marital status: Married    Spouse name: Not on file  . Number of children: Not on file  . Years of education: Not on file  . Highest education level: Bachelor's degree (e.g., BA, AB, BS)  Occupational History  . Not on file  Tobacco Use  . Smoking status: Never  . Smokeless tobacco: Never  Vaping Use  . Vaping Use: Never used  Substance and Sexual Activity  . Alcohol use: Yes     Alcohol/week: 6.0 standard drinks    Types: 6 Cans of beer per week  . Drug use: Not on file  . Sexual activity: Not on file  Other Topics Concern  . 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     Social Determinants of Health   Financial Resource Strain: Not on file  Food Insecurity: Not on file  Transportation Needs: Not on file  Physical Activity: Not on file  Stress: Not on file  Social Connections: Not on file  Intimate Partner Violence: Not on file    Outpatient Medications Prior to Visit  Medication Sig Dispense Refill  . aspirin EC 81 MG tablet Take 1 tablet (81 mg total) by mouth daily. 30 tablet 0  . clopidogrel (PLAVIX) 75 MG tablet TAKE 1 TABLET BY MOUTH ONCE DAILY 90 tablet 2  . DULoxetine (CYMBALTA) 60 MG capsule TAKE 1 CAPSULE BY MOUTH 2 TIMES DAILY 180 capsule 2  . Eszopiclone 3 MG TABS TAKE 1 TABLET BY MOUTH IMMEDIATELY BEFORE BEDTIME AS NEEDED FOR SLEEP 90 tablet 2  . fexofenadine (ALLEGRA) 60 MG tablet Take 60 mg by mouth daily.    Marland Kitchen gabapentin (NEURONTIN) 100 MG capsule Take 1 capsule by mouth 3 times daily. 270 capsule 2  . hydrochlorothiazide (HYDRODIURIL) 25 MG tablet Take 1 tablet (25 mg total) by mouth daily. 90 tablet 2  . hydrocortisone (ANUSOL-HC) 25 MG suppository Unwrap and insert 1 suppository (25 mg total) rectally 2 (two) times daily. 12 suppository 0  . lansoprazole (PREVACID) 30 MG capsule TAKE 1 CAPSULE BY MOUTH DAILY AT 12 NOON. 90 capsule 2  . Lidocaine HCl 4 % GEL Apply 2 mLs topically once as needed for up to 1 dose. 100 mL 3  . lithium carbonate (LITHOBID) 300 MG CR tablet TAKE 1 TABLET BY MOUTH TWICE DAILY 180 tablet 2  . LORazepam (ATIVAN) 1 MG tablet Take 1 tablet (1 mg total) by mouth every 8 (eight) hours as needed for anxiety. 270 tablet 2  . losartan (COZAAR) 100 MG tablet Take 1 tablet (100 mg total) by mouth daily. 90 tablet 3  . metoprolol succinate (TOPROL-XL)  100 MG 24 hr tablet TAKE 1 TABLET (100 MG TOTAL) BY MOUTH DAILY. 90 tablet 2  . Multiple Vitamin (MULTIVITAMIN WITH MINERALS) TABS tablet Take 1 tablet by mouth daily. Men's Multivitamin    . nitroGLYCERIN (NITROSTAT) 0.4 MG SL tablet Dissolve 1 tablet (0.4 mg total) under the tongue every 5 (five) minutes up to 3 doses as needed for chest pain. If no relief after 3 doses call 911. 25 tablet 1  . ranolazine (RANEXA) 1000 MG  SR tablet TAKE 1 TABLET BY MOUTH TWO TIMES DAILY 180 tablet 3  . rosuvastatin (CRESTOR) 20 MG tablet TAKE 1 TABLET BY MOUTH AT BEDTIME 90 tablet 2  . Semaglutide-Weight Management 2.4 MG/0.75ML SOAJ Inject 2.4 mg into the skin once a week. 3 mL 2   No facility-administered medications prior to visit.    Allergies  Allergen Reactions  . Trintellix [Vortioxetine] Other (See Comments)    Headaches.  . Penicillins Rash    Did it involve swelling of the face/tongue/throat, SOB, or low BP? Unknown Did it involve sudden or severe rash/hives, skin peeling, or any reaction on the inside of your mouth or nose? Unknown Did you need to seek medical attention at a hospital or doctor's office? Unknown When did it last happen? childhood reaction      If all above answers are "NO", may proceed with cephalosporin use.     Review of Systems  Constitutional:  Positive for fatigue. Negative for chills, fever and unexpected weight change.  HENT:  Negative for congestion, rhinorrhea, sinus pressure, sneezing and sore throat.   Eyes:  Negative for discharge and visual disturbance.  Respiratory:  Positive for chest tightness and shortness of breath. Negative for cough and wheezing.   Cardiovascular:  Positive for chest pain. Negative for palpitations.  Gastrointestinal:  Negative for abdominal pain, diarrhea, nausea and vomiting.  Endocrine: Negative for polydipsia, polyphagia and polyuria.  Genitourinary:  Negative for decreased urine volume, difficulty urinating, dysuria, frequency,  penile swelling and urgency.  Musculoskeletal:  Negative for back pain, gait problem, joint swelling, neck pain and neck stiffness.  Allergic/Immunologic: Positive for environmental allergies.  Neurological:  Positive for dizziness and tremors. Negative for seizures, weakness, numbness and headaches.  Psychiatric/Behavioral:  Negative for confusion, hallucinations, sleep disturbance and suicidal ideas. The patient is not nervous/anxious and is not hyperactive.       Objective:    Physical Exam Vitals reviewed.  Constitutional:      Appearance: Normal appearance.  HENT:     Right Ear: Tympanic membrane normal.     Left Ear: Tympanic membrane normal.     Nose: Nose normal.     Mouth/Throat:     Mouth: Mucous membranes are moist.     Pharynx: Posterior oropharyngeal erythema present.  Cardiovascular:     Rate and Rhythm: Normal rate and regular rhythm.     Pulses: Normal pulses.     Heart sounds: Normal heart sounds.  Pulmonary:     Effort: Pulmonary effort is normal.     Breath sounds: Normal breath sounds.  Abdominal:     General: Bowel sounds are normal.     Palpations: Abdomen is soft.  Musculoskeletal:        General: Normal range of motion.  Skin:    General: Skin is warm and dry.     Capillary Refill: Capillary refill takes less than 2 seconds.  Neurological:     General: No focal deficit present.     Mental Status: He is alert and oriented to person, place, and time.     Motor: Tremor (left upper extremity) present.  Psychiatric:        Mood and Affect: Mood normal.        Behavior: Behavior normal.    BP 106/82   Pulse 97   Temp 98.5 F (36.9 C)   Ht 5' 7.5" (1.715 m)   Wt 177 lb 3.2 oz (80.4 kg)   SpO2 98%   BMI 27.34 kg/m  Wt Readings from Last 3 Encounters:  10/24/21 189 lb (85.7 kg)  08/03/21 188 lb (85.3 kg)  04/13/21 213 lb (96.6 kg)    Health Maintenance Due  Topic Date Due  . HIV Screening  Never done  . Hepatitis C Screening  Never done   . TETANUS/TDAP  Never done  . Zoster Vaccines- Shingrix (1 of 2) Never done       Lab Results  Component Value Date   TSH 1.620 10/27/2021   Lab Results  Component Value Date   WBC 6.3 12/04/2021   HGB 12.7 (L) 12/04/2021   HCT 36.8 (L) 12/04/2021   MCV 99 (H) 12/04/2021   PLT 149 (L) 12/04/2021   Lab Results  Component Value Date   NA 140 12/04/2021   K 3.7 12/04/2021   CO2 25 12/04/2021   GLUCOSE 85 12/04/2021   BUN 13 12/04/2021   CREATININE 1.04 12/04/2021   BILITOT 0.6 12/04/2021   ALKPHOS 42 (L) 12/04/2021   AST 18 12/04/2021   ALT 20 12/04/2021   PROT 6.2 12/04/2021   ALBUMIN 4.3 12/04/2021   CALCIUM 8.9 12/04/2021   EGFR 83 12/04/2021   Lab Results  Component Value Date   CHOL 134 10/27/2021   Lab Results  Component Value Date   HDL 50 10/27/2021   Lab Results  Component Value Date   LDLCALC 63 10/27/2021   Lab Results  Component Value Date   TRIG 118 10/27/2021   Lab Results  Component Value Date   CHOLHDL 2.7 10/27/2021        Assessment & Plan:   1. Hypotension due to drugs - Troponin T - Comprehensive metabolic panel - CBC with Differential/Platelet - EKG 12-Lead  2. Angina at rest [facility]) - Troponin T - Comprehensive metabolic panel - CBC with Differential/Platelet - EKG 12-Lead  3. Hx of CABG - Troponin T - Comprehensive metabolic panel - CBC with Differential/Platelet - EKG 12-Lead  4. Severe episode of recurrent major depressive disorder, without psychotic features (HCC)  5. Gastroesophageal reflux disease, unspecified whether esophagitis present - pantoprazole (PROTONIX) 40 MG tablet; Take 1 tablet (40 mg total) by mouth daily.  Dispense: 30 tablet; Refill: 3  6. History of ulcer disease - pantoprazole (PROTONIX) 40 MG tablet; Take 1 tablet (40 mg total) by mouth daily.  Dispense: 30 tablet; Refill: 3     Follow-up: pending lab results  An After Visit Summary was printed and given to the patient.  Janie Morning, NP [facility] [phone]

## 2022-01-18 LAB — CBC WITH DIFFERENTIAL/PLATELET
Basophils Absolute: 0 10*3/uL (ref 0.0–0.2)
Basos: 1 %
EOS (ABSOLUTE): 0.4 10*3/uL (ref 0.0–0.4)
Eos: 6 %
Hematocrit: 45.4 % (ref 37.5–51.0)
Hemoglobin: 16 g/dL (ref 13.0–17.7)
Immature Grans (Abs): 0 10*3/uL (ref 0.0–0.1)
Immature Granulocytes: 0 %
Lymphocytes Absolute: 1.8 10*3/uL (ref 0.7–3.1)
Lymphs: 26 %
MCH: 34.6 pg — ABNORMAL HIGH (ref 26.6–33.0)
MCHC: 35.2 g/dL (ref 31.5–35.7)
MCV: 98 fL — ABNORMAL HIGH (ref 79–97)
Monocytes Absolute: 0.5 10*3/uL (ref 0.1–0.9)
Monocytes: 7 %
Neutrophils Absolute: 4.2 10*3/uL (ref 1.4–7.0)
Neutrophils: 60 %
Platelets: 172 10*3/uL (ref 150–450)
RBC: 4.63 x10E6/uL (ref 4.14–5.80)
RDW: 11.9 % (ref 11.6–15.4)
WBC: 7 10*3/uL (ref 3.4–10.8)

## 2022-01-18 LAB — COMPREHENSIVE METABOLIC PANEL
ALT: 37 IU/L (ref 0–44)
AST: 32 IU/L (ref 0–40)
Albumin/Globulin Ratio: 2.7 — ABNORMAL HIGH (ref 1.2–2.2)
Albumin: 5.2 g/dL — ABNORMAL HIGH (ref 3.8–4.9)
Alkaline Phosphatase: 59 IU/L (ref 44–121)
BUN/Creatinine Ratio: 13 (ref 10–24)
BUN: 15 mg/dL (ref 8–27)
Bilirubin Total: 1.4 mg/dL — ABNORMAL HIGH (ref 0.0–1.2)
CO2: 20 mmol/L (ref 20–29)
Calcium: 9.4 mg/dL (ref 8.6–10.2)
Chloride: 101 mmol/L (ref 96–106)
Creatinine, Ser: 1.13 mg/dL (ref 0.76–1.27)
Globulin, Total: 1.9 g/dL (ref 1.5–4.5)
Glucose: 149 mg/dL — ABNORMAL HIGH (ref 70–99)
Potassium: 3.5 mmol/L (ref 3.5–5.2)
Sodium: 138 mmol/L (ref 134–144)
Total Protein: 7.1 g/dL (ref 6.0–8.5)
eGFR: 74 mL/min/{1.73_m2} (ref >59)

## 2022-01-18 LAB — TROPONIN T: Troponin T (Highly Sensitive): 7 ng/L (ref 0–22)

## 2022-01-22 ENCOUNTER — Other Ambulatory Visit (HOSPITAL_COMMUNITY): Payer: Self-pay

## 2022-02-05 ENCOUNTER — Other Ambulatory Visit (HOSPITAL_COMMUNITY): Payer: Self-pay

## 2022-02-06 ENCOUNTER — Other Ambulatory Visit: Payer: Self-pay | Admitting: Nurse Practitioner

## 2022-02-06 ENCOUNTER — Other Ambulatory Visit (HOSPITAL_COMMUNITY): Payer: Self-pay

## 2022-02-06 DIAGNOSIS — K219 Gastro-esophageal reflux disease without esophagitis: Secondary | ICD-10-CM

## 2022-02-06 MED ORDER — PANTOPRAZOLE SODIUM 40 MG PO TBEC
40.0000 mg | DELAYED_RELEASE_TABLET | Freq: Every day | ORAL | 3 refills | Status: DC
  Filled 2022-02-06 – 2022-02-10 (×2): qty 90, 90d supply, fill #0
  Filled 2022-05-16: qty 90, 90d supply, fill #1
  Filled 2022-08-11: qty 90, 90d supply, fill #2

## 2022-02-10 ENCOUNTER — Other Ambulatory Visit (HOSPITAL_COMMUNITY): Payer: Self-pay

## 2022-02-12 ENCOUNTER — Other Ambulatory Visit: Payer: Self-pay | Admitting: Nurse Practitioner

## 2022-02-12 ENCOUNTER — Other Ambulatory Visit (HOSPITAL_COMMUNITY): Payer: Self-pay

## 2022-02-12 DIAGNOSIS — E663 Overweight: Secondary | ICD-10-CM

## 2022-02-12 MED ORDER — SEMAGLUTIDE-WEIGHT MANAGEMENT 2.4 MG/0.75ML ~~LOC~~ SOAJ
2.4000 mg | SUBCUTANEOUS | 2 refills | Status: DC
  Filled 2022-02-12: qty 3, 28d supply, fill #0

## 2022-02-20 ENCOUNTER — Other Ambulatory Visit (HOSPITAL_COMMUNITY): Payer: Self-pay

## 2022-02-26 ENCOUNTER — Encounter: Payer: Self-pay | Admitting: Nurse Practitioner

## 2022-02-26 ENCOUNTER — Other Ambulatory Visit (HOSPITAL_COMMUNITY): Payer: Self-pay

## 2022-02-26 ENCOUNTER — Ambulatory Visit (INDEPENDENT_AMBULATORY_CARE_PROVIDER_SITE_OTHER): Payer: No Typology Code available for payment source | Admitting: Nurse Practitioner

## 2022-02-26 VITALS — BP 110/64 | HR 70 | Temp 97.2°F | Ht 70.0 in | Wt 180.2 lb

## 2022-02-26 DIAGNOSIS — H60502 Unspecified acute noninfective otitis externa, left ear: Secondary | ICD-10-CM | POA: Diagnosis not present

## 2022-02-26 DIAGNOSIS — J3089 Other allergic rhinitis: Secondary | ICD-10-CM

## 2022-02-26 DIAGNOSIS — H8109 Meniere's disease, unspecified ear: Secondary | ICD-10-CM | POA: Diagnosis not present

## 2022-02-26 DIAGNOSIS — I2510 Atherosclerotic heart disease of native coronary artery without angina pectoris: Secondary | ICD-10-CM

## 2022-02-26 DIAGNOSIS — H6122 Impacted cerumen, left ear: Secondary | ICD-10-CM | POA: Diagnosis not present

## 2022-02-26 MED ORDER — RANOLAZINE ER 500 MG PO TB12
500.0000 mg | ORAL_TABLET | Freq: Two times a day (BID) | ORAL | 1 refills | Status: DC
  Filled 2022-02-26: qty 90, 45d supply, fill #0

## 2022-02-26 MED ORDER — METOPROLOL SUCCINATE ER 50 MG PO TB24
50.0000 mg | ORAL_TABLET | Freq: Every day | ORAL | 3 refills | Status: DC
  Filled 2022-02-26: qty 90, 90d supply, fill #0
  Filled 2022-06-05: qty 90, 90d supply, fill #1
  Filled 2022-07-21 – 2022-08-27 (×2): qty 90, 90d supply, fill #2

## 2022-02-26 MED ORDER — FLUTICASONE PROPIONATE 50 MCG/ACT NA SUSP: 2.0000 | Freq: Every day | NASAL | 6 refills | Status: DC

## 2022-02-26 MED ORDER — HYDROCHLOROTHIAZIDE 12.5 MG PO TABS: 12.5000 mg | ORAL_TABLET | Freq: Every day | ORAL | 3 refills | Status: DC

## 2022-02-26 MED ORDER — NEOMYCIN-POLYMYXIN-HC 3.5-10000-1 OT SOLN: 3.0000 [drp] | Freq: Four times a day (QID) | OTIC | 0 refills | Status: DC

## 2022-02-26 MED ORDER — BACITRA-NEOMYCIN-POLYMYXIN-HC 1 % OP OINT: TOPICAL_OINTMENT | OPHTHALMIC | 0 refills | Status: DC

## 2022-02-26 NOTE — Patient Instructions (Addendum)
Begin Hydrochlorothiazide 12.5 mg daily ?Use Flonase nasal spray daily ?Decrease Metoprolol to 50 mg daily ?Decrease Ranexa to 50 mg twice daily ?Use Cortisporin drops twice daily to left ear for 5 days ?Follow-up 4-weeks ? ? ?M?ni?re's Disease ? ?M?ni?re's disease is an inner ear disorder. It causes recurrent attacks of a spinning sensation (vertigo), and ringing in the ear (tinnitus). It also causes hearing loss and a feeling of fullness or pressure in the ear. It typically affects one ear but can affect both. This is a lifelong condition, and it may get worse over time. There are treatment options to help manage the symptoms of M?ni?re's disease. ?What are the causes? ?This condition is caused by having too much of the fluid that is in the inner ear (endolymph). When fluid builds up in the inner ear, it affects the nerves that control balance and hearing. The reason for the fluid buildup is not known. Possible causes include: ?Allergies. ?An abnormal reaction of the body's defense system (autoimmune disease). ?Viral infection of the inner ear. ?Head injury. ?What increases the risk? ?The following factors may make you more likely to develop this condition: ?Being between 45 and 28 years old. ?Having a family history of M?ni?re's disease. ?Having a history of autoimmune disease. ?Having an injury to the head (head trauma). ?What are the signs or symptoms? ?Symptoms of this condition include: ?Fullness and pressure in your ear. ?Roaring or ringing in your ear (tinnitus). ?Vertigo and loss of balance. ?Decreased hearing. ?Nausea and vomiting. This is rare. ?Symptoms of this condition can come and go and may last for up to 4 or more hours at a time. Symptoms usually start in one ear. They may become more frequent and eventually involve both ears. ?How is this diagnosed? ?This condition is diagnosed based on a physical exam and tests. Tests may include: ?A hearing test (audiogram). ?An electronystagmogram (ENG). This  tests your balance nerve (vestibular nerve). ?Imaging studies of your inner ear and hearing nerve, such as an MRI. ?Other balance tests, such as rotational or balance platform tests. ?How is this treated? ?There is no cure for this condition, but treatment can help to manage your symptoms. Treatment may include: ?A low-salt (low-sodium) diet. This may help to reduce fluid in the body and relieve symptoms. ?Oral or injected medicines to reduce or control: ?Vertigo. ?Nausea. ?Fluid retention. ?Use of an air pressure pulse generator. This is a machine that sends small pressure pulses into your ear canal. ?Injections through the ear drum (tympanic membrane) to control vertigo symptoms. ?Hearing aids. ?Inner ear surgery. This is rare. ?Follow these instructions at home: ?Eating and drinking ?Avoid caffeine. ?Do not drink alcohol. ?Drink enough fluid to keep your urine pale yellow. ?Limit the sodium in your diet as told by your health care provider. This is usually no more than 1,500-2,000 mg per day. Check ingredients and nutrition facts on packaged foods and beverages. ?General instructions ?Take over-the-counter and prescription medicines only as told by your health care provider. ?Do not drive if you have vertigo or dizziness. ?Do not use any products that contain nicotine or tobacco. These products include cigarettes, chewing tobacco, and vaping devices, such as e-cigarettes. If you need help quitting, ask your health care provider. ?Keep all follow-up visits. This is important. ?Where to find more information ?To find more information, advice, and guidance, please see these online sites: ?American Academy of Otolaryngology-Head and Neck Surgery Foundation: www.enthealth.org ?[employer] on Deafness and Other Communication Disorders: FightListings.se ?American Hearing  Research Foundation: www.american-hearing.org ?Contact a health care provider if: ?You have symptoms that last longer than 4 hours. ?You have  new or worse symptoms. ?Get help right away if: ?You have been vomiting for 24 hours. ?You cannot keep fluids down. ?You have chest pain or trouble breathing. ?These symptoms may represent a serious problem that is an emergency. Do not wait to see if the symptoms will go away. Get medical help right away. Call your local emergency services (911 in the [place] Do not drive yourself to the hospital. ?Summary ?M?ni?re's disease is an inner ear disorder. ?This condition causes recurrent attacks of a spinning sensation (vertigo), ringing in the ear (tinnitus), hearing loss, and ear fullness. ?Symptoms of this condition can come and go and may last for up to 4 or more hours at a time. ?M?ni?re's disease can be treated with a low-sodium diet, medicines, and sometimes, surgery. ?This information is not intended to replace advice given to you by your health care provider. Make sure you discuss any questions you have with your health care provider. ?Document Revised: 09/05/2020 Document Reviewed: 09/05/2020 ?Elsevier Patient Education ? Story. ? ?

## 2022-02-26 NOTE — Progress Notes (Signed)
? ?Acute Office Visit ? ?Subjective:  ? ? Patient ID: Jon Gonzales, male    DOB: [birth year], 61 y.o.   MRN: [id number] ? ?Chief Complaint  ?Patient presents with  ? Dizziness  ? ? ?HPI: ?Patient is in today for Dizziness ? ?He reports recurrent dizziness. Onset was one day ago. Treatment has included Meclizine with minimal relief. He describes it as feeling like room is spinning and feeling unbalanced, occurs intermittently, and typically lasts minutes to hours.  It typically occurs when he is  any position . It is usually relieved by closing eyes. Reports he has been having symptoms intermittently for a year. Denies nausea, vomiting, falls, seizures, or syncope. Pt has a history of non-seasonal chronic allergic rhinitis, treated with Allegra and daily saline Nettypot nasal washes BID. Pt has cardiac history of MI, CAD, HTN, CABG, followed by Dr Servando Salina, cardiologist.  ? ?Wt Readings from Last 3 Encounters:  ?02/26/22 180 lb 3.2 oz (81.7 kg)  ?01/17/22 177 lb 3.2 oz (80.4 kg)  ?10/24/21 189 lb (85.7 kg)  ?  BP Readings from Last 3 Encounters:  ?02/26/22 110/64  ?01/17/22 106/82  ?12/19/21 92/70  ?   ? ?Lab Results  ?Component Value Date  ? WBC 7.0 01/17/2022  ? HGB 16.0 01/17/2022  ? HCT 45.4 01/17/2022  ? MCV 98 (H) 01/17/2022  ? PLT 172 01/17/2022  ? Lab Results  ?Component Value Date  ? NA 138 01/17/2022  ? K 3.5 01/17/2022  ? CO2 20 01/17/2022  ? BUN 15 01/17/2022  ? CREATININE 1.13 01/17/2022  ? CALCIUM 9.4 01/17/2022  ? GLUCOSE 149 (H) 01/17/2022  ?  ? ? ? ?Past Medical History:  ?Diagnosis Date  ? Abscess of buttock 11/23/2020  ? Angina pectoris (HCC) 10/26/2019  ? Anxiety, generalized 05/22/2016  ? Bilateral leg edema 01/29/2020  ? CAD (coronary artery disease)   ? Cath 2013 LAD 95% stenosis, D1 90% stenosis, RCA 20%, Circ 30%.  Previous stent to D1 2012.   EF 60%.    ? Class 2 obesity due to excess calories without serious comorbidity in adult 09/11/2016  ? Confusional arousals 09/11/2016  ? Dehydration 11/07/2020  ?  Depression   ? Dizziness 10/27/2020  ? Excessive daytime sleepiness 09/11/2016  ? Herpes zoster 01/20/2020  ? HTN (hypertension)   ? Hypercholesterolemia 05/22/2016  ? Hyperlipidemia   ? Hypertension 05/22/2016  ? Hypogonadism in male 05/22/2016  ? Hypotension 11/07/2020  ? Low testosterone   ? Nephrolithiasis   ? OSA (obstructive sleep apnea) 09/11/2016  ? OSA on CPAP 01/03/2017  ? 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 Other fatigue 10/27/2020  ? Paranoid reaction, acute (HCC) 05/22/2016  ? Paronychia of finger, left 08/22/2020  ? PLMD (periodic limb movement disorder) 09/11/2016  ? PUD (peptic ulcer disease)   ? Screening for prostate cancer 05/22/2016  ? 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 Snoring 09/11/2016  ? TIA (transient ischemic attack)   ? ? ?Past Surgical History:  ?Procedure Laterality Date  ? CHOLECYSTECTOMY    ? CORONARY ARTERY BYPASS GRAFT  02/19/12  ? L - LAD, SVG - D1, SVG - OM  ? CORONARY STENT INTERVENTION N/A 11/05/2019  ? Procedure: CORONARY STENT INTERVENTION;  Surgeon: Runell Gess, MD;  Location: [facility] INVASIVE CV LAB;  Service: Cardiovascular;  Laterality: N/A;  SVG to DIAG  ? LEFT HEART CATH AND CORS/GRAFTS ANGIOGRAPHY N/A 11/05/2019  ? Procedure: LEFT HEART  CATH AND CORS/GRAFTS ANGIOGRAPHY;  Surgeon: Runell Gess, MD;  Location: [facility] INVASIVE CV LAB;  Service: Cardiovascular;  Laterality: N/A;  ? RENAL ANGIOGRAPHY N/A 11/05/2019  ? Procedure: RENAL ANGIOGRAPHY;  Surgeon: Runell Gess, MD;  Location: [facility] INVASIVE CV LAB;  Service: Cardiovascular;  Laterality: N/A;  ? TONSILLECTOMY AND ADENOIDECTOMY    ? WRIST SURGERY    ? ? ?Family History  ?Problem Relation Age of Onset  ? CAD Father 16  ? Aneurysm Mother 36  ?     cerebral  ? Heart disease Sister   ?     Unclear  ? Healthy Brother   ? ? ?Social History  ? ?Socioeconomic History  ? Marital status: Married  ?  Spouse name: Not on file  ? Number of children: Not on  file  ? Years of education: Not on file  ? Highest education level: Bachelor's degree (e.g., BA, AB, BS)  ?Occupational History  ? Not on file  ?Tobacco Use  ? Smoking status: Never  ? Smokeless tobacco: Never  ?Vaping Use  ? Vaping Use: Never used  ?Substance and Sexual Activity  ? Alcohol use: Yes  ?  Alcohol/week: 6.0 standard drinks  ?  Types: 6 Cans of beer per week  ? Drug use: Not on file  ? Sexual activity: Not on file  ?Other Topics Concern  ? Not on file  ?Social History Narrative  ? 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    ? ?Social Determinants of Health  ? ?Financial Resource Strain: Not on file  ?Food Insecurity: Not on file  ?Transportation Needs: Not on file  ?Physical Activity: Not on file  ?Stress: Not on file  ?Social Connections: Not on file  ?Intimate Partner Violence: Not on file  ? ? ?Outpatient Medications Prior to Visit  ?Medication Sig Dispense Refill  ? aspirin EC 81 MG tablet Take 1 tablet (81 mg total) by mouth daily. 30 tablet 0  ? clopidogrel (PLAVIX) 75 MG tablet TAKE 1 TABLET BY MOUTH ONCE DAILY 90 tablet 2  ? DULoxetine (CYMBALTA) 60 MG capsule TAKE 1 CAPSULE BY MOUTH 2 TIMES DAILY 180 capsule 2  ? Eszopiclone 3 MG TABS TAKE 1 TABLET BY MOUTH IMMEDIATELY BEFORE BEDTIME AS NEEDED FOR SLEEP 90 tablet 2  ? fexofenadine (ALLEGRA) 60 MG tablet Take 60 mg by mouth daily.    ? lithium carbonate (LITHOBID) 300 MG CR tablet TAKE 1 TABLET BY MOUTH TWICE DAILY 180 tablet 2  ? LORazepam (ATIVAN) 1 MG tablet Take 1 tablet (1 mg total) by mouth every 8 (eight) hours as needed for anxiety. 270 tablet 2  ? metoprolol succinate (TOPROL-XL) 100 MG 24 hr tablet TAKE 1 TABLET BY MOUTH DAILY. 90 tablet 2  ? Multiple Vitamin (MULTIVITAMIN WITH MINERALS) TABS tablet Take 1 tablet by mouth daily. Men's Multivitamin    ? nitroGLYCERIN (NITROSTAT) 0.4 MG SL tablet Dissolve 1 tablet (0.4 mg total) under the tongue every 5 (five) minutes up to 3 doses as needed for chest  pain. If no relief after 3 doses call 911. 25 tablet 1  ? pantoprazole (PROTONIX) 40 MG tablet Take 1 tablet by mouth daily. 90 tablet 3  ? ranolazine (RANEXA) 1000 MG SR tablet TAKE 1 TABLET BY MOUTH TWO TIMES DAILY (Patient taking differently: Take 500 mg by mouth 2 (two) times daily.) 180 tablet 3  ? rosuvastatin (CRESTOR) 20 MG tablet TAKE 1 TABLET BY MOUTH  AT BEDTIME 90 tablet 2  ? Semaglutide-Weight Management 2.4 MG/0.75ML SOAJ Inject 2.4 mg into the skin once a week. 3 mL 2  ? ?No facility-administered medications prior to visit.  ? ? ?Allergies  ?Allergen Reactions  ? Trintellix [Vortioxetine] Other (See Comments)  ?  Headaches.  ? Penicillins Rash  ?  Did it involve swelling of the face/tongue/throat, SOB, or low BP? Unknown ?Did it involve sudden or severe rash/hives, skin peeling, or any reaction on the inside of your mouth or nose? Unknown ?Did you need to seek medical attention at a hospital or doctor's office? Unknown ?When did it last happen? childhood reaction      ?If all above answers are ?NO?, may proceed with cephalosporin use. ?  ? ? ?Review of Systems ?See pertinent positives and negatives per HPI.  ?   ?Objective:  ?  ?Physical Exam ?Vitals reviewed.  ?Constitutional:   ?   Appearance: Normal appearance.  ?HENT:  ?   Right Ear: Tympanic membrane normal.  ?   Left Ear: Tenderness present. There is impacted cerumen.  ?Cardiovascular:  ?   Rate and Rhythm: Normal rate and regular rhythm.  ?   Pulses: Normal pulses.  ?   Heart sounds: Normal heart sounds.  ?Neurological:  ?   Mental Status: He is alert.  ? ? ?BP 110/64   Pulse 70   Temp (!) 97.2 ?F (36.2 ?C)   Ht 5\' 10"  (1.778 m)   Wt 180 lb 3.2 oz (81.7 kg)   SpO2 99%   BMI 25.86 kg/m?  ?Wt Readings from Last 3 Encounters:  ?02/26/22 180 lb 3.2 oz (81.7 kg)  ?01/17/22 177 lb 3.2 oz (80.4 kg)  ?10/24/21 189 lb (85.7 kg)  ? ? ?Health Maintenance Due  ?Topic Date Due  ? HIV Screening  Never done  ? Hepatitis C Screening  Never done  ?  TETANUS/TDAP  Never done  ? Zoster Vaccines- Shingrix (1 of 2) Never done  ? ?Lab Results  ?Component Value Date  ? TSH 1.620 10/27/2021  ? ?Lab Results  ?Component Value Date  ? WBC 7.0 01/17/2022  ? HGB 16.0 04/0

## 2022-02-27 ENCOUNTER — Ambulatory Visit: Payer: No Typology Code available for payment source | Admitting: Nurse Practitioner

## 2022-02-27 ENCOUNTER — Other Ambulatory Visit (HOSPITAL_COMMUNITY): Payer: Self-pay

## 2022-02-28 ENCOUNTER — Encounter: Payer: Self-pay | Admitting: Cardiology

## 2022-02-28 ENCOUNTER — Ambulatory Visit (INDEPENDENT_AMBULATORY_CARE_PROVIDER_SITE_OTHER): Payer: No Typology Code available for payment source | Admitting: Cardiology

## 2022-02-28 VITALS — BP 116/70 | HR 71 | Ht 70.0 in | Wt 179.6 lb

## 2022-02-28 DIAGNOSIS — R072 Precordial pain: Secondary | ICD-10-CM

## 2022-02-28 DIAGNOSIS — I209 Angina pectoris, unspecified: Secondary | ICD-10-CM

## 2022-02-28 DIAGNOSIS — I10 Essential (primary) hypertension: Secondary | ICD-10-CM | POA: Diagnosis not present

## 2022-02-28 DIAGNOSIS — I2510 Atherosclerotic heart disease of native coronary artery without angina pectoris: Secondary | ICD-10-CM | POA: Diagnosis not present

## 2022-02-28 DIAGNOSIS — I25709 Atherosclerosis of coronary artery bypass graft(s), unspecified, with unspecified angina pectoris: Secondary | ICD-10-CM

## 2022-02-28 NOTE — Progress Notes (Signed)
?Cardiology Office Note:   ? ?Date:  02/28/2022  ? ?ID:  Jon Gonzales, DOB 1961-08-27, MRN [id number] ? ?PCP:  Lillard Anes, MD  ?Cardiologist:  Jenne Campus, MD   ? ?Referring MD: Lillard Anes,*  ? ?Chief Complaint  ?Patient presents with  ? Chest Pain  ?  Ongoing for 8 months   ? Establish Care  ? ? ?History of Present Illness:   ? ?Jon Gonzales is a 61 y.o. male with past medical history significant for premature coronary artery disease.  In 2012 he got coronary bypass graft with 3 vessels LIMA to LAD SVG diagonal SVG to obtuse marginal.  In January 2021 he required drug-eluting stent to native diagonal branch.  He also had history of depression, anxiety, essential hypertension, dyslipidemia.  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  Overall he seems to be doing quite well but he described to have some episode of chest pain he said this is the same thing he had before that happen a few times a month only with extreme exertion regular activity usually do not cause the problem.  He lost significant amount of weight over the last few months that he overall feels better because of this.  He does not exercise on the regular basis he works with very stressful environment.  He is not on any special diet ? ?Past Medical History:  ?Diagnosis Date  ? Abscess of buttock 11/23/2020  ? Angina pectoris ([place]) 10/26/2019  ? Anxiety, generalized 05/22/2016  ? Bilateral leg edema 01/29/2020  ? CAD (coronary artery disease)   ? Cath 2013 LAD 95% stenosis, D1 90% stenosis, RCA 20%, Circ 30%.  Previous stent to D1 2012.   EF 60%.    ? Class 2 obesity due to excess calories without serious comorbidity in adult 09/11/2016  ? Confusional arousals 09/11/2016  ? Dehydration 11/07/2020  ? Depression   ? Dizziness 10/27/2020  ? Excessive daytime sleepiness 09/11/2016  ? Herpes zoster 01/20/2020  ? HTN (hypertension)   ? Hypercholesterolemia 05/22/2016  ? Hyperlipidemia   ?  Hypertension 05/22/2016  ? Hypogonadism in male 05/22/2016  ? Hypotension 11/07/2020  ? Low testosterone   ? Nephrolithiasis   ? OSA (obstructive sleep apnea) 09/11/2016  ? OSA on CPAP 01/03/2017  ? 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 Other fatigue 10/27/2020  ? Paranoid reaction, acute ([place]) 05/22/2016  ? Paronychia of finger, left 08/22/2020  ? PLMD (periodic limb movement disorder) 09/11/2016  ? PUD (peptic ulcer disease)   ? Screening for prostate cancer 05/22/2016  ? Severe episode of recurrent major depressive disorder, without psychotic features ([place]) 09/11/2016  ? Snoring 09/11/2016  ? TIA (transient ischemic attack)   ? ? ?Past Surgical History:  ?Procedure Laterality Date  ? CHOLECYSTECTOMY    ? CORONARY ARTERY BYPASS GRAFT  02/19/12  ? L - LAD, SVG - D1, SVG - OM  ? CORONARY STENT INTERVENTION N/A 11/05/2019  ? Procedure: CORONARY STENT INTERVENTION;  Surgeon: Lorretta Harp, MD;  Location: [place] CV LAB;  Service: Cardiovascular;  Laterality: N/A;  SVG to DIAG  ? LEFT HEART CATH AND CORS/GRAFTS ANGIOGRAPHY N/A 11/05/2019  ? Procedure: LEFT HEART CATH AND CORS/GRAFTS ANGIOGRAPHY;  Surgeon: Lorretta Harp, MD;  Location: [place] CV LAB;  Service: Cardiovascular;  Laterality: N/A;  ? RENAL ANGIOGRAPHY N/A 11/05/2019  ? Procedure: RENAL ANGIOGRAPHY;  Surgeon: Lorretta Harp, MD;  Location: [place]  CV LAB;  Service: Cardiovascular;  Laterality: N/A;  ? TONSILLECTOMY AND ADENOIDECTOMY    ? WRIST SURGERY    ? ? ?Current Medications: ?Current Meds  ?Medication Sig  ? aspirin EC 81 MG tablet Take 1 tablet (81 mg total) by mouth daily.  ? clopidogrel (PLAVIX) 75 MG tablet TAKE 1 TABLET BY MOUTH ONCE DAILY  ? DULoxetine (CYMBALTA) 60 MG capsule TAKE 1 CAPSULE BY MOUTH 2 TIMES DAILY (Patient taking differently: Take 60 mg by mouth 2 (two) times daily.)  ? Eszopiclone 3 MG TABS TAKE 1 TABLET BY MOUTH IMMEDIATELY BEFORE BEDTIME AS NEEDED FOR SLEEP  ? fexofenadine (ALLEGRA) 60 MG tablet  Take 60 mg by mouth daily.  ? fluticasone (FLONASE) 50 MCG/ACT nasal spray Place 2 sprays into both nostrils daily.  ? lithium carbonate (LITHOBID) 300 MG CR tablet TAKE 1 TABLET BY MOUTH TWICE DAILY  ? LORazepam (ATIVAN) 1 MG tablet Take 1 tablet (1 mg total) by mouth every 8 (eight) hours as needed for anxiety.  ? metoprolol succinate (TOPROL-XL) 50 MG 24 hr tablet Take 1 tablet by mouth daily. Take with or immediately following a meal.  ? pantoprazole (PROTONIX) 40 MG tablet Take 1 tablet by mouth daily.  ? ranolazine (RANEXA) 500 MG 12 hr tablet Take 1 tablet by mouth 2 times daily.  ? rosuvastatin (CRESTOR) 20 MG tablet TAKE 1 TABLET BY MOUTH AT BEDTIME (Patient taking differently: Take 20 mg by mouth at bedtime.)  ? Semaglutide-Weight Management 2.4 MG/0.75ML SOAJ Inject 2.4 mg into the skin once a week.  ?  ? ?Allergies:   Trintellix [vortioxetine] and Penicillins  ? ?Social History  ? ?Socioeconomic History  ? Marital status: Married  ?  Spouse name: Not on file  ? Number of children: Not on file  ? Years of education: Not on file  ? Highest education level: Bachelor's degree (e.g., BA, AB, BS)  ?Occupational History  ? Not on file  ?Tobacco Use  ? Smoking status: Never  ? Smokeless tobacco: Never  ?Vaping Use  ? Vaping Use: Never used  ?Substance and Sexual Activity  ? Alcohol use: Yes  ?  Alcohol/week: 6.0 standard drinks  ?  Types: 6 Cans of beer per week  ? Drug use: Not on file  ? Sexual activity: Not on file  ?Other Topics Concern  ? Not on file  ?Social History Narrative  ? 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    ? ?Social Determinants of Health  ? ?Financial Resource Strain: Not on file  ?Food Insecurity: Not on file  ?Transportation Needs: Not on file  ?Physical Activity: Not on file  ?Stress: Not on file  ?Social Connections: Not on file  ?  ? ?Family History: ?The patient's family history includes Aneurysm (age of onset: 73) in his mother; CAD (age of onset:  56) in his father; Healthy in his brother; Heart disease in his sister. ?ROS:   ?Please see the history of present illness.    ?All 14 point review of systems negative except as described per history of present illness ? ?EKGs/Labs/Other Studies Reviewed:   ? ? ? ?Recent Labs: ?10/27/2021: TSH 1.620 ?01/17/2022: ALT 37; BUN 15; Creatinine, Ser 1.13; Hemoglobin 16.0; Platelets 172; Potassium 3.5; Sodium 138  ?Recent Lipid Panel ?   ?Component Value Date/Time  ? CHOL 134 10/27/2021 0852  ? TRIG 118 10/27/2021 0852  ? HDL 50 10/27/2021 0852  ? CHOLHDL 2.7 10/27/2021  [id number]  ? [place] 63 10/27/2021 0852  ? ? ?Physical Exam:   ? ?VS:  BP 116/70 (BP Location: Right Arm, Patient Position: Sitting)   Pulse 71   Ht 5\' 10"  (1.778 m)   Wt 179 lb 9.6 oz (81.5 kg)   SpO2 95%   BMI 25.77 kg/m?    ? ?Wt Readings from Last 3 Encounters:  ?02/28/22 179 lb 9.6 oz (81.5 kg)  ?02/26/22 180 lb 3.2 oz (81.7 kg)  ?01/17/22 177 lb 3.2 oz (80.4 kg)  ?  ? ?GEN:  Well nourished, well developed in no acute distress ?HEENT: Normal ?NECK: No JVD; No carotid bruits ?LYMPHATICS: No lymphadenopathy ?CARDIAC: RRR, no murmurs, no rubs, no gallops ?RESPIRATORY:  Clear to auscultation without rales, wheezing or rhonchi  ?ABDOMEN: Soft, non-tender, non-distended ?MUSCULOSKELETAL:  No edema; No deformity  ?SKIN: Warm and dry ?LOWER EXTREMITIES: no swelling ?NEUROLOGIC:  Alert and oriented x 3 ?PSYCHIATRIC:  Normal affect  ? ?ASSESSMENT:   ? ?1. Precordial pain   ?2. Coronary artery disease involving native coronary artery of native heart, unspecified whether angina present   ?3. Primary hypertension   ?4. Angina pectoris ([place])   ?5. Coronary artery disease involving coronary bypass graft of native heart with angina pectoris ([place])   ? ?PLAN:   ? ?In order of problems listed above: ? ?Coronary disease now with chest pain I will schedule him to have a stress test to see how extensive problem there is and also the distribution of ischemia.  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

## 2022-02-28 NOTE — Patient Instructions (Signed)
Medication Instructions:  ?Your physician recommends that you continue on your current medications as directed. Please refer to the Current Medication list given to you today.  ?*If you need a refill on your cardiac medications before your next appointment, please call your pharmacy* ? ? ?Lab Work: ?[place] ?If you have labs (blood work) drawn today and your tests are completely normal, you will receive your results only by: ?MyChart Message (if you have MyChart) OR ?A paper copy in the mail ?If you have any lab test that is abnormal or we need to change your treatment, we will call you to review the results. ? ? ?Testing/Procedures: ?Your physician has requested that you have a Exercise Cardiolyte. For further information please visit [email or web address]. Please follow instruction sheet, as given. ? ?The test will take approximately 3 to 4 hours to complete; you may bring reading material.  If someone comes with you to your appointment, they will need to remain in the main lobby due to limited space in the testing area.  ? ?How to prepare for your Myocardial Perfusion Test: ?Do not eat or drink 3 hours prior to your test, except you may have water. ?Do not consume products containing caffeine (regular or decaffeinated) 12 hours prior to your test. (ex: coffee, chocolate, sodas, tea). ?Do bring a list of your current medications with you.  If not listed below, you may take your medications as normal. ?Do wear comfortable clothes (no dresses or overalls) and walking shoes, tennis shoes preferred (No heels or open toe shoes are allowed). ?Do NOT wear cologne, perfume, aftershave, or lotions (deodorant is allowed). ?If these instructions are not followed, your test will have to be rescheduled.   ? ? ?Follow-Up: ?At [facility], you and your health needs are our priority.  As part of our continuing mission to provide you with exceptional heart care, we have created designated Provider Care Teams.  These  Care Teams include your primary Cardiologist (physician) and Advanced Practice Providers (APPs -  Physician Assistants and Nurse Practitioners) who all work together to provide you with the care you need, when you need it. ? ?We recommend signing up for the patient portal called "MyChart".  Sign up information is provided on this After Visit Summary.  MyChart is used to connect with patients for Virtual Visits (Telemedicine).  Patients are able to view lab/test results, encounter notes, upcoming appointments, etc.  Non-urgent messages can be sent to your provider as well.   ?To learn more about what you can do with MyChart, go to [email or web address].   ? ?Your next appointment:   ?6 week(s) ? ?The format for your next appointment:   ?In Person ? ?Provider:   ?Jenne Campus, MD  ? ? ?Other Instructions ?NA  ?

## 2022-03-01 ENCOUNTER — Ambulatory Visit (INDEPENDENT_AMBULATORY_CARE_PROVIDER_SITE_OTHER): Payer: No Typology Code available for payment source | Admitting: Nurse Practitioner

## 2022-03-01 VITALS — BP 104/66 | HR 68 | Temp 97.2°F | Ht 70.0 in | Wt 171.0 lb

## 2022-03-01 DIAGNOSIS — R42 Dizziness and giddiness: Secondary | ICD-10-CM

## 2022-03-01 DIAGNOSIS — R2689 Other abnormalities of gait and mobility: Secondary | ICD-10-CM | POA: Diagnosis not present

## 2022-03-01 LAB — GLUCOSE, POCT (MANUAL RESULT ENTRY)
POC Glucose: 74 mg/dl (ref 70–99)
POC Glucose: 77 mg/dl (ref 70–99)

## 2022-03-01 NOTE — Progress Notes (Signed)
 Acute Office Visit  Subjective:    Patient ID: Jon Gonzales, male    DOB: 12/11/1960, 61 y.o.   MRN: [id number]  Chief Complaint  Patient presents with   Dizziness    HPI: Patient is in today with intermittent dizziness. Onset was a few weeks ago. This episode began this afternoon. Standing, changing positions, and turning head aggravates dizziness. Sitting still alleviates symptoms. Denies nausea, headache, near syncope. Admits he is off-balance and slightly unsteady when ambulating.  States he was seen by cardiologist, dr Kraswoksi, yesterday, medications were adjusted due to recent episodes of hypotension. Blood glucose level 77 upon arrival to office. Pt given peanut M and Ms and diet coke. Repeat blood glucose approximately 30 minutes later 74.Current weight 171 lbs, BMI 24.54. Pt currently prescribed Wegovy 2.4 mg for weight loss. States he had a protein shake and pack of crackers today, totaling approximately 360 calories. Pt states he often drinks a protein shake for breakfast, skips lunch, eats a pack of crackers, and eats a dinner meal. Reports he would like to reduce weight down to 165 lbs. He was recently referred to ENT for evaluation of Meniere's Disease, awaiting appt.    Past Medical History:  Diagnosis Date   Abscess of buttock 11/23/2020   Angina pectoris (HCC) 10/26/2019   Anxiety, generalized 05/22/2016   Bilateral leg edema 01/29/2020   CAD (coronary artery disease)    Cath 2013 LAD 95% stenosis, D1 90% stenosis, RCA 20%, Circ 30%.  Previous stent to D1 2012.   EF 60%.     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   Other fatigue 10/27/2020   Paranoid reaction, acute (HCC) 05/22/2016   Paronychia of finger, left 08/22/2020   PLMD (periodic limb movement disorder) 09/11/2016   PUD (peptic ulcer disease)    Screening for prostate cancer 05/22/2016   Severe episode of recurrent major depressive disorder, without psychotic features (HCC) 09/11/2016   Snoring 09/11/2016   TIA (transient ischemic attack)     Past Surgical History:  Procedure Laterality Date   CHOLECYSTECTOMY     CORONARY ARTERY BYPASS GRAFT  02/19/12   L - LAD, SVG - D1, SVG - OM   CORONARY STENT INTERVENTION N/A 11/05/2019   Procedure: CORONARY STENT INTERVENTION;  Surgeon: Berry, Jonathan J, MD;  [facility];  Service: Cardiovascular;  Laterality: N/A;  SVG to DIAG   LEFT HEART CATH AND CORS/GRAFTS ANGIOGRAPHY N/A 11/05/2019   Procedure: LEFT HEART CATH AND CORS/GRAFTS ANGIOGRAPHY;  Surgeon: Berry, Jonathan J, MD;  Location: [facility];  Service: Cardiovascular;  Laterality: N/A;   RENAL ANGIOGRAPHY N/A 11/05/2019   Procedure: RENAL ANGIOGRAPHY;  Surgeon: Berry, Jonathan J, MD;  [facility]: [facility];  Service: Cardiovascular;  Laterality: N/A;   TONSILLECTOMY AND ADENOIDECTOMY     WRIST SURGERY      Family History  Problem Relation Age of Onset   CAD Father 50     Aneurysm Mother 46       cerebral   Heart disease Sister        Unclear   Healthy Brother     Social History   Socioeconomic History   Marital status: Married    Spouse name: Not on file   Number of children: Not on file   Years of education: Not on file   Highest education level: Bachelor's degree (e.g., BA, AB, BS)  Occupational History   Not on file  Tobacco Use   Smoking status: Never   Smokeless tobacco: Never  Vaping Use   Vaping Use: Never used   Substance and Sexual Activity   Alcohol use: Yes    Alcohol/week: 6.0 standard drinks    Types: 6 Cans of beer per week   Drug use: Not on file   Sexual activity: Not on file  Other Topics Concern   Not on file  Social History Narrative   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     Social Determinants of Health   Financial Resource Strain: Not on file  Food Insecurity: Not on file  Transportation Needs: Not on file  Physical Activity: Not on file  Stress: Not on file  Social Connections: Not on file  Intimate Partner Violence: Not on file    Outpatient Medications Prior to Visit  Medication Sig Dispense Refill   aspirin EC 81 MG tablet Take 1 tablet (81 mg total) by mouth daily. 30 tablet 0   clopidogrel (PLAVIX) 75 MG tablet TAKE 1 TABLET BY MOUTH ONCE DAILY 90 tablet 2   DULoxetine (CYMBALTA) 60 MG capsule TAKE 1 CAPSULE BY MOUTH 2 TIMES DAILY (Patient taking differently: Take 60 mg by mouth 2 (two) times daily.) 180 capsule 2   Eszopiclone 3 MG TABS TAKE 1 TABLET BY MOUTH IMMEDIATELY BEFORE BEDTIME AS NEEDED FOR SLEEP 90 tablet 2   fexofenadine (ALLEGRA) 60 MG tablet Take 60 mg by mouth daily.     fluticasone (FLONASE) 50 MCG/ACT nasal spray Place 2 sprays into both nostrils daily. 16 g 6   lithium carbonate (LITHOBID) 300 MG CR tablet TAKE 1 TABLET BY MOUTH TWICE DAILY 180 tablet 2   LORazepam (ATIVAN) 1 MG tablet Take 1 tablet (1 mg total) by mouth every 8 (eight) hours as needed for anxiety. 270 tablet 2   metoprolol succinate (TOPROL-XL) 50 MG 24 hr tablet Take 1 tablet by mouth daily. Take with or immediately following a meal. 90 tablet 3   Multiple Vitamin (MULTIVITAMIN WITH MINERALS) TABS tablet Take 1 tablet by mouth daily. Men's Multivitamin     neomycin-polymyxin-hydrocortisone (CORTISPORIN) OTIC solution Place 3 drops into the left ear 4 (four) times daily. 10 mL 0   nitroGLYCERIN (NITROSTAT) 0.4 MG SL tablet Dissolve 1 tablet (0.4  mg total) under the tongue every 5 (five) minutes up to 3 doses as needed for chest pain. If no relief after 3 doses call 911. 25 tablet 1   pantoprazole (PROTONIX) 40 MG tablet Take 1 tablet by mouth daily. 90 tablet 3   ranolazine (RANEXA) 500 MG 12 hr tablet Take 1 tablet by mouth 2 times daily. 90 tablet 1   rosuvastatin (CRESTOR) 20 MG tablet TAKE 1 TABLET BY MOUTH AT BEDTIME (Patient taking differently: Take 20 mg by mouth at bedtime.) 90 tablet 2   Semaglutide-Weight Management 2.4 MG/0.75ML SOAJ Inject 2.4 mg into the skin once a week. 3 mL 2     No facility-administered medications prior to visit.    Allergies  Allergen Reactions   Trintellix [Vortioxetine] Other (See Comments)    Headaches.   Penicillins Rash    Did it involve swelling of the face/tongue/throat, SOB, or low BP? Unknown Did it involve sudden or severe rash/hives, skin peeling, or any reaction on the inside of your mouth or nose? Unknown Did you need to seek medical attention at a hospital or doctor's office? Unknown When did it last happen? childhood reaction      If all above answers are "NO", may proceed with cephalosporin use.     Review of Systems  Neurological:  Positive for dizziness and light-headedness.  All other systems reviewed and are negative.     Objective:    Physical Exam Vitals reviewed.  Constitutional:      Appearance: Normal appearance.  HENT:     Head: Normocephalic.     Right Ear: Tympanic membrane normal.     Left Ear: Tympanic membrane normal.     Nose: Nose normal.     Mouth/Throat:     Mouth: Mucous membranes are moist.  Eyes:     Pupils: Pupils are equal, round, and reactive to light.  Cardiovascular:     Rate and Rhythm: Normal rate and regular rhythm.     Pulses: Normal pulses.     Heart sounds: Normal heart sounds.  Pulmonary:     Effort: Pulmonary effort is normal.     Breath sounds: Normal breath sounds.  Abdominal:     General: Bowel sounds are normal.      Palpations: Abdomen is soft.  Musculoskeletal:        General: Normal range of motion.     Cervical back: Neck supple.  Skin:    General: Skin is warm and dry.     Capillary Refill: Capillary refill takes less than 2 seconds.     Coloration: Skin is pale.  Neurological:     General: No focal deficit present.     Mental Status: He is alert and oriented to person, place, and time.  Psychiatric:        Mood and Affect: Mood normal.        Behavior: Behavior normal.    BP 104/66   Pulse 68   Temp (!) 97.2 F (36.2 C)   Ht 5' 10" (1.778 m)   Wt 171 lb (77.6 kg)   SpO2 97%   BMI 24.54 kg/m   Wt Readings from Last 3 Encounters:  02/28/22 179 lb 9.6 oz (81.5 kg)  02/26/22 180 lb 3.2 oz (81.7 kg)  01/17/22 177 lb 3.2 oz (80.4 kg)    Health Maintenance Due  Topic Date Due   HIV Screening  Never done   Hepatitis C Screening  Never done   TETANUS/TDAP  Never done   Zoster Vaccines- Shingrix (1 of 2) Never done       Lab Results  Component Value Date   TSH 1.620 10/27/2021   Lab Results  Component Value Date   WBC 7.0 01/17/2022   HGB 16.0 01/17/2022   HCT 45.4 01/17/2022   MCV 98 (H) 01/17/2022   PLT 172 01/17/2022   Lab Results  Component Value Date   NA 138 01/17/2022   K 3.5 01/17/2022   CO2 20 01/17/2022   GLUCOSE 149 (H) 01/17/2022   BUN 15 01/17/2022   CREATININE 1.13 01/17/2022   BILITOT 1.4 (H) 01/17/2022   ALKPHOS 59 01/17/2022   AST   32 01/17/2022   ALT 37 01/17/2022   PROT 7.1 01/17/2022   ALBUMIN 5.2 (H) 01/17/2022   CALCIUM 9.4 01/17/2022   EGFR 74 01/17/2022   Lab Results  Component Value Date   CHOL 134 10/27/2021   Lab Results  Component Value Date   HDL 50 10/27/2021   Lab Results  Component Value Date   LDLCALC 63 10/27/2021   Lab Results  Component Value Date   TRIG 118 10/27/2021   Lab Results  Component Value Date   CHOLHDL 2.7 10/27/2021        Assessment & Plan:   1. Dizziness and giddiness - Orthostatic vital  signs -rest and push fluids -increase fluid and calorie intake -monitor BP -keep scheduled appt with ENT  2. Balance problem - POCT glucose (manual entry) - POCT glucose (manual entry) - Orthostatic vital signs -fall precautions -change positions slowly        Follow-up: PRN  An After Visit Summary was printed and given to the patient.  I, Rip Harbour, NP, have reviewed all documentation for this visit. The documentation on 03/04/22 for the exam, diagnosis, procedures, and orders are all accurate and complete.    Signed, Rip Harbour, NP [place] [phone]

## 2022-03-02 LAB — LIPID PANEL
Chol/HDL Ratio: 2.9 ratio (ref 0.0–5.0)
Cholesterol, Total: 137 mg/dL (ref 100–199)
HDL: 48 mg/dL (ref >39)
LDL Chol Calc (NIH): 64 mg/dL (ref 0–99)
Triglycerides: 147 mg/dL (ref 0–149)
VLDL Cholesterol Cal: 25 mg/dL (ref 5–40)

## 2022-03-02 LAB — LIPOPROTEIN A (LPA): Lipoprotein (a): 42.3 nmol/L (ref <75.0)

## 2022-03-04 ENCOUNTER — Encounter: Payer: Self-pay | Admitting: Nurse Practitioner

## 2022-03-05 ENCOUNTER — Other Ambulatory Visit: Payer: Self-pay

## 2022-03-05 ENCOUNTER — Telehealth: Payer: Self-pay

## 2022-03-05 ENCOUNTER — Other Ambulatory Visit (HOSPITAL_COMMUNITY): Payer: Self-pay

## 2022-03-05 DIAGNOSIS — I2510 Atherosclerotic heart disease of native coronary artery without angina pectoris: Secondary | ICD-10-CM

## 2022-03-05 DIAGNOSIS — E7800 Pure hypercholesterolemia, unspecified: Secondary | ICD-10-CM

## 2022-03-05 MED ORDER — ROSUVASTATIN CALCIUM 40 MG PO TABS
40.0000 mg | ORAL_TABLET | Freq: Every day | ORAL | 2 refills | Status: DC
  Filled 2022-03-05: qty 30, 30d supply, fill #0
  Filled 2022-05-23: qty 90, 90d supply, fill #0

## 2022-03-05 MED ORDER — CLOPIDOGREL BISULFATE 75 MG PO TABS
ORAL_TABLET | Freq: Every day | ORAL | 2 refills | Status: DC
  Filled 2022-06-05: qty 90, 90d supply, fill #0
  Filled 2022-06-09 – 2022-09-03 (×2): qty 90, 90d supply, fill #1

## 2022-03-05 MED ORDER — ROSUVASTATIN CALCIUM 40 MG PO TABS
40.0000 mg | ORAL_TABLET | Freq: Every day | ORAL | 2 refills | Status: DC
  Filled 2022-03-05: qty 90, 90d supply, fill #0

## 2022-03-05 NOTE — Telephone Encounter (Signed)
-----   Message from Georgeanna Lea, MD sent at 03/02/2022  9:51 AM EDT ----- Increase Crestor to 40 mg daily, fasting lipid profile, AST ALT in 6 weeks need to be repeated.  Please tell them that LP(a) is normal

## 2022-03-05 NOTE — Telephone Encounter (Signed)
Patient notified of results and recommendations and agree with plan. New strength of Crestor sent. Lab order on file. Patient draw his labs at a sister office.

## 2022-03-07 ENCOUNTER — Telehealth (HOSPITAL_COMMUNITY): Payer: Self-pay

## 2022-03-07 ENCOUNTER — Encounter (HOSPITAL_COMMUNITY): Payer: Self-pay

## 2022-03-08 ENCOUNTER — Other Ambulatory Visit: Payer: Self-pay | Admitting: Nurse Practitioner

## 2022-03-08 DIAGNOSIS — K1370 Unspecified lesions of oral mucosa: Secondary | ICD-10-CM

## 2022-03-08 MED ORDER — LIDOCAINE VISCOUS HCL 2 % MT SOLN: 15.0000 mL | Freq: Four times a day (QID) | OROMUCOSAL | 0 refills | Status: DC | PRN

## 2022-03-15 ENCOUNTER — Ambulatory Visit (INDEPENDENT_AMBULATORY_CARE_PROVIDER_SITE_OTHER): Payer: No Typology Code available for payment source

## 2022-03-15 ENCOUNTER — Encounter (HOSPITAL_COMMUNITY): Payer: Self-pay | Admitting: *Deleted

## 2022-03-15 DIAGNOSIS — R072 Precordial pain: Secondary | ICD-10-CM

## 2022-03-19 ENCOUNTER — Encounter: Payer: Self-pay | Admitting: Nurse Practitioner

## 2022-03-20 ENCOUNTER — Other Ambulatory Visit (HOSPITAL_COMMUNITY): Payer: Self-pay

## 2022-03-20 ENCOUNTER — Other Ambulatory Visit: Payer: Self-pay

## 2022-03-20 DIAGNOSIS — I2510 Atherosclerotic heart disease of native coronary artery without angina pectoris: Secondary | ICD-10-CM

## 2022-03-20 MED ORDER — HYDROCHLOROTHIAZIDE 12.5 MG PO TABS
12.5000 mg | ORAL_TABLET | Freq: Every day | ORAL | 1 refills | Status: DC
  Filled 2022-03-20: qty 90, 90d supply, fill #0
  Filled 2022-06-21: qty 90, 90d supply, fill #1

## 2022-03-20 MED ORDER — RANOLAZINE ER 500 MG PO TB12
500.0000 mg | ORAL_TABLET | Freq: Two times a day (BID) | ORAL | 1 refills | Status: DC
  Filled 2022-03-20: qty 180, 90d supply, fill #0

## 2022-03-20 MED ORDER — TECHNETIUM TC 99M TETROFOSMIN IV KIT
10.8000 | PACK | Freq: Once | INTRAVENOUS | Status: AC | PRN
  Administered 2022-03-15: 10.8 via INTRAVENOUS

## 2022-03-22 ENCOUNTER — Other Ambulatory Visit (HOSPITAL_COMMUNITY): Payer: Self-pay

## 2022-03-22 ENCOUNTER — Other Ambulatory Visit: Payer: Self-pay | Admitting: Nurse Practitioner

## 2022-03-22 ENCOUNTER — Ambulatory Visit: Payer: No Typology Code available for payment source

## 2022-03-22 DIAGNOSIS — E663 Overweight: Secondary | ICD-10-CM

## 2022-03-22 DIAGNOSIS — R072 Precordial pain: Secondary | ICD-10-CM | POA: Diagnosis not present

## 2022-03-22 LAB — MYOCARDIAL PERFUSION IMAGING
LV dias vol: 60 mL (ref 62–150)
LV sys vol: 17 mL
Nuc Stress EF: 72 %
Peak HR: 123 {beats}/min
Rest HR: 82 {beats}/min
Rest Nuclear Isotope Dose: 10.8 mCi
SDS: 3
SRS: 3
SSS: 5
Stress Nuclear Isotope Dose: 31.2 mCi
TID: 0.7

## 2022-03-22 MED ORDER — SEMAGLUTIDE-WEIGHT MANAGEMENT 2.4 MG/0.75ML ~~LOC~~ SOAJ
2.4000 mg | SUBCUTANEOUS | 2 refills | Status: DC
  Filled 2022-03-22: qty 3, 28d supply, fill #0
  Filled 2022-04-21: qty 3, 28d supply, fill #1
  Filled 2022-05-14: qty 3, 28d supply, fill #2

## 2022-03-22 MED ORDER — REGADENOSON 0.4 MG/5ML IV SOLN
0.4000 mg | Freq: Once | INTRAVENOUS | Status: AC
  Administered 2022-03-22: 0.4 mg via INTRAVENOUS

## 2022-03-22 MED ORDER — TECHNETIUM TC 99M TETROFOSMIN IV KIT
31.2000 | PACK | Freq: Once | INTRAVENOUS | Status: AC | PRN
  Administered 2022-03-22: 31.2 via INTRAVENOUS

## 2022-03-27 ENCOUNTER — Telehealth: Payer: Self-pay

## 2022-03-27 MED ORDER — ISOSORBIDE MONONITRATE ER 30 MG PO TB24: 30.0000 mg | ORAL_TABLET | Freq: Every day | ORAL | 6 refills | Status: DC

## 2022-03-27 NOTE — Telephone Encounter (Signed)
Spoke with pt regarding lab results. Sent Imdur to the Pharmacy.

## 2022-03-30 ENCOUNTER — Other Ambulatory Visit: Payer: Self-pay | Admitting: Nurse Practitioner

## 2022-03-30 ENCOUNTER — Other Ambulatory Visit (HOSPITAL_COMMUNITY): Payer: Self-pay

## 2022-03-30 ENCOUNTER — Encounter: Payer: Self-pay | Admitting: Nurse Practitioner

## 2022-03-30 DIAGNOSIS — I25118 Atherosclerotic heart disease of native coronary artery with other forms of angina pectoris: Secondary | ICD-10-CM

## 2022-03-30 MED ORDER — NITROGLYCERIN 0.4 MG SL SUBL: 0.4000 mg | SUBLINGUAL_TABLET | SUBLINGUAL | 1 refills | Status: DC | PRN

## 2022-03-31 ENCOUNTER — Other Ambulatory Visit (HOSPITAL_COMMUNITY): Payer: Self-pay

## 2022-04-11 ENCOUNTER — Ambulatory Visit (INDEPENDENT_AMBULATORY_CARE_PROVIDER_SITE_OTHER): Payer: No Typology Code available for payment source | Admitting: Cardiology

## 2022-04-11 ENCOUNTER — Encounter: Payer: Self-pay | Admitting: Cardiology

## 2022-04-11 ENCOUNTER — Other Ambulatory Visit (HOSPITAL_COMMUNITY): Payer: Self-pay

## 2022-04-11 VITALS — BP 106/70 | HR 73 | Ht 70.0 in | Wt 177.8 lb

## 2022-04-11 DIAGNOSIS — I209 Angina pectoris, unspecified: Secondary | ICD-10-CM | POA: Diagnosis not present

## 2022-04-11 DIAGNOSIS — I25709 Atherosclerosis of coronary artery bypass graft(s), unspecified, with unspecified angina pectoris: Secondary | ICD-10-CM | POA: Diagnosis not present

## 2022-04-11 DIAGNOSIS — I2510 Atherosclerotic heart disease of native coronary artery without angina pectoris: Secondary | ICD-10-CM

## 2022-04-11 DIAGNOSIS — I10 Essential (primary) hypertension: Secondary | ICD-10-CM

## 2022-04-11 DIAGNOSIS — G4733 Obstructive sleep apnea (adult) (pediatric): Secondary | ICD-10-CM

## 2022-04-11 DIAGNOSIS — E7800 Pure hypercholesterolemia, unspecified: Secondary | ICD-10-CM

## 2022-04-11 MED ORDER — EZETIMIBE 10 MG PO TABS
10.0000 mg | ORAL_TABLET | Freq: Every day | ORAL | 3 refills | Status: DC
  Filled 2022-04-11: qty 90, 90d supply, fill #0
  Filled 2022-05-14 – 2022-06-21 (×2): qty 90, 90d supply, fill #1

## 2022-04-11 NOTE — Patient Instructions (Signed)
Medication Instructions:  Your physician has recommended you make the following change in your medication:   START: Zetia 10 mg daily  *If you need a refill on your cardiac medications before your next appointment, please call your pharmacy*   Lab Work: Your physician recommends that you return for lab work in:   Labs in 6 weeks: LFT, Lipids  If you have labs (blood work) drawn today and your tests are completely normal, you will receive your results only by: MyChart Message (if you have MyChart) OR A paper copy in the mail If you have any lab test that is abnormal or we need to change your treatment, we will call you to review the results.   Testing/Procedures: None   Follow-Up: At [facility], you and your health needs are our priority.  As part of our continuing mission to provide you with exceptional heart care, we have created designated Provider Care Teams.  These Care Teams include your primary Cardiologist (physician) and Advanced Practice Providers (APPs -  Physician Assistants and Nurse Practitioners) who all work together to provide you with the care you need, when you need it.  We recommend signing up for the patient portal called "MyChart".  Sign up information is provided on this After Visit Summary.  MyChart is used to connect with patients for Virtual Visits (Telemedicine).  Patients are able to view lab/test results, encounter notes, upcoming appointments, etc.  Non-urgent messages can be sent to your provider as well.   To learn more about what you can do with MyChart, go to [email or web address].    Your next appointment:   6 week(s)  The format for your next appointment:   In Person  Provider:   [name] M.D.  If primary card or EP is not listed click here to update    :1}    Other Instructions None  Important Information About Sugar

## 2022-04-11 NOTE — Addendum Note (Signed)
Addended by: Roxanne Mins I on: 04/11/2022 08:36 AM   Modules accepted: Orders

## 2022-04-11 NOTE — Progress Notes (Signed)
Cardiology Office Note:    Date:  04/11/2022   ID:  Jon Gonzales, DOB May 15, 1961, MRN [id number]  PCP:  [name], MD  Cardiologist:  [name], MD    Referring MD: [name],*   Chief Complaint  Patient presents with   Follow-up    History of Present Illness:    Jon Gonzales is a 61 y.o. male  with past medical history significant for premature coronary artery disease.  In 2012 he got coronary bypass graft with 3 vessels LIMA to LAD SVG diagonal SVG to obtuse marginal.  In January 2021 he required drug-eluting stent to native diagonal branch.  He also had history of depression, anxiety, essential hypertension, dyslipidemia.  Also recently he started having some difficulty hearing to some suspicion for Mnire's disease. He presented to my office in May with chief complaint of chest pain.  Since that time he did have a stress test done stress to show ischemia only mild and small area involving inferior wall, prior cardiac catheterization from 2021 show RCA lesion however was only 50%, we gradually try to put him on medication he said since he seen him last time he have to use nitroglycerin only once In about 3 weeks ago he said the episode of chest pain happens every few weeks.  We had a long discussion about what to do with the situation.  I think we have a level that appropriate guideline directed medical therapy need to be maximized.  However because of his lack of exercises it is difficult to objectively assess the effectiveness of the treatment.  Therefore I suggested to start walking on the regular basis I told him at least half an hour 5 times a week that should be a minimum.  I found him to walk 15 minutes in the morning before he goes to work and 15 minutes after he came back from work.  Hopefully that allowed me to assess how he is doing from a cardiac point of view.  He is taking 60 mg of Imdur Plavix aspirin which I will continue.  Past Medical  History:  Diagnosis Date   Abscess of buttock 11/23/2020   Angina pectoris (HCC) 10/26/2019   Anxiety, generalized 05/22/2016   Bilateral leg edema 01/29/2020   CAD (coronary artery disease)    Cath 2013 LAD 95% stenosis, D1 90% stenosis, RCA 20%, Circ 30%.  Previous stent to D1 2012.   EF 60%.     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   Other fatigue 10/27/2020   Paranoid reaction, acute (HCC) 05/22/2016   Paronychia of finger, left 08/22/2020   PLMD (periodic limb movement disorder) 09/11/2016   PUD (peptic ulcer disease)    Screening for prostate cancer 05/22/2016   Severe episode of recurrent major depressive disorder, without psychotic features (HCC) 09/11/2016   Snoring 09/11/2016   TIA (transient ischemic attack)     Past Surgical History:  Procedure Laterality Date  CHOLECYSTECTOMY     CORONARY ARTERY BYPASS GRAFT  02/19/12   L - LAD, SVG - D1, SVG - OM   CORONARY STENT INTERVENTION N/A 11/05/2019   Procedure: CORONARY STENT INTERVENTION;  Surgeon: Runell Gess, MD;  [facility];  Service: Cardiovascular;  Laterality: N/A;  SVG to DIAG   LEFT HEART CATH AND CORS/GRAFTS ANGIOGRAPHY N/A 11/05/2019   Procedure: LEFT HEART CATH AND CORS/GRAFTS ANGIOGRAPHY;  Surgeon: Runell Gess, MD;  [facility]: [facility];  Service: Cardiovascular;  Laterality: N/A;   RENAL ANGIOGRAPHY N/A 11/05/2019   Procedure: RENAL ANGIOGRAPHY;  Surgeon:  Runell Gess, MD;  [facility];  Service: Cardiovascular;  Laterality: N/A;   TONSILLECTOMY AND ADENOIDECTOMY     WRIST SURGERY      Current Medications: Current Meds  Medication Sig   aspirin EC 81 MG tablet Take 1 tablet (81 mg total) by mouth daily.   clopidogrel (PLAVIX) 75 MG tablet TAKE 1 TABLET BY MOUTH ONCE DAILY (Patient taking differently: Take 75 mg by mouth daily.)   DULoxetine (CYMBALTA) 60 MG capsule TAKE 1 CAPSULE BY MOUTH 2 TIMES DAILY (Patient taking differently: Take 60 mg by mouth 2 (two) times daily.)   Eszopiclone 3 MG TABS TAKE 1 TABLET BY MOUTH IMMEDIATELY BEFORE BEDTIME AS NEEDED FOR SLEEP (Patient taking differently: Take 3 mg by mouth at bedtime.)   fexofenadine (ALLEGRA) 60 MG tablet Take 60 mg by mouth daily.   fluticasone (FLONASE) 50 MCG/ACT nasal spray Place 2 sprays into both nostrils daily.   hydrochlorothiazide (HYDRODIURIL) 12.5 MG tablet Take 1 tablet (12.5 mg total) by mouth daily.   isosorbide mononitrate (IMDUR) 30 MG 24 hr tablet Take 1 tablet (30 mg total) by mouth daily.   lidocaine (XYLOCAINE) 2 % solution Use as directed 15 mLs in the mouth or throat every 6 (six) hours as needed for mouth pain.   lithium carbonate (LITHOBID) 300 MG CR tablet TAKE 1 TABLET BY MOUTH TWICE DAILY (Patient taking differently: Take 600 mg by mouth 2 (two) times daily.)   LORazepam (ATIVAN) 1 MG tablet Take 1 tablet (1 mg total) by mouth every 8 (eight) hours as needed for anxiety.   metoprolol succinate (TOPROL-XL) 50 MG 24 hr tablet Take 1 tablet by mouth daily. Take with or immediately following a meal.   Multiple Vitamin (MULTIVITAMIN WITH MINERALS) TABS tablet Take 1 tablet by mouth daily. Men's Multivitamin   neomycin-polymyxin-hydrocortisone (CORTISPORIN) OTIC solution Place 3 drops into the left ear 4 (four) times daily.   nitroGLYCERIN (NITROSTAT) 0.4 MG SL tablet Dissolve 1 tablet (0.4 mg total) under the tongue every 5 (five) minutes up to 3  doses as needed for chest pain. If no relief after 3 doses call 911.   pantoprazole (PROTONIX) 40 MG tablet Take 1 tablet by mouth daily.   ranolazine (RANEXA) 500 MG 12 hr tablet Take 1 tablet by mouth 2 times daily.   rosuvastatin (CRESTOR) 40 MG tablet Take 1 tablet (40 mg total) by mouth daily.   Semaglutide-Weight Management 2.4 MG/0.75ML SOAJ Inject 2.4 mg into the skin once a week.     Allergies:   Trintellix [vortioxetine] and Penicillins   Social History   Socioeconomic History   Marital status: Married    Spouse name: Not on file   Number of children: Not on file   Years of education: Not on file   Highest education level: Bachelor's degree (e.g., BA, AB, BS)  Occupational  History   Not on file  Tobacco Use   Smoking status: Never   Smokeless tobacco: Never  Vaping Use   Vaping Use: Never used  Substance and Sexual Activity   Alcohol use: Yes    Alcohol/week: 6.0 standard drinks of alcohol    Types: 6 Cans of beer per week   Drug use: Not on file   Sexual activity: Not on file  Other Topics Concern   Not on file  Social History Narrative   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     Social Determinants of Health   Financial Resource Strain: Not on file  Food Insecurity: Not on file  Transportation Needs: Not on file  Physical Activity: Not on file  Stress: Not on file  Social Connections: Not on file     Family History: The patient's family history includes Aneurysm (age of onset: 61) in his mother; CAD (age of onset: 82) in his father; Healthy in his brother; Heart disease in his sister. ROS:   Please see the history of present illness.    All 14 point review of systems negative except as described per history of present illness  EKGs/Labs/Other Studies Reviewed:      Recent Labs: 10/27/2021: TSH 1.620 01/17/2022: ALT 37; BUN 15; Creatinine, Ser 1.13; Hemoglobin 16.0; Platelets 172; Potassium 3.5; Sodium 138  Recent  Lipid Panel    Component Value Date/Time   CHOL 137 02/28/2022 1613   TRIG 147 02/28/2022 1613   HDL 48 02/28/2022 1613   CHOLHDL 2.9 02/28/2022 1613   LDLCALC 64 02/28/2022 1613    Physical Exam:    VS:  BP 106/70 (BP Location: Left Arm, Patient Position: Sitting)   Pulse 73   Ht 5\' 10"  (1.778 m)   Wt 177 lb 12.8 oz (80.6 kg)   SpO2 94%   BMI 25.51 kg/m     Wt Readings from Last 3 Encounters:  04/11/22 177 lb 12.8 oz (80.6 kg)  03/15/22 179 lb (81.2 kg)  03/01/22 171 lb (77.6 kg)     GEN:  Well nourished, well developed in no acute distress HEENT: Normal NECK: No JVD; No carotid bruits LYMPHATICS: No lymphadenopathy CARDIAC: RRR, no murmurs, no rubs, no gallops RESPIRATORY:  Clear to auscultation without rales, wheezing or rhonchi  ABDOMEN: Soft, non-tender, non-distended MUSCULOSKELETAL:  No edema; No deformity  SKIN: Warm and dry LOWER EXTREMITIES: no swelling NEUROLOGIC:  Alert and oriented x 3 PSYCHIATRIC:  Normal affect   ASSESSMENT:    1. Coronary artery disease involving native coronary artery of native heart without angina pectoris   2. Angina pectoris (HCC)   3. Hypertension, unspecified type   4. Coronary artery disease involving coronary bypass graft of native heart with angina pectoris (HCC)   5. OSA (obstructive sleep apnea)   6. Hypercholesterolemia    PLAN:    In order of problems listed above:  Coronary artery disease plan as described above.  We will continue antiplatelet therapy as well as antianginal therapy.  Difficult objectively assess effectiveness of this treatment because of low level of exercises.  I encouraged him to BL be more active.  I see him back in my office in about 6 weeks to reassess the situation.  I told him to let me know if he will develop more chest pain or more frequent need for nitroglycerin. Dyslipidemia I did review K PN which show me his LDL of 64.  Because  of young age and premature coronary artery disease I would  like to see his LDL less than 55.,  Therefore I will ask him to ask and Zetia 10 mg to his medical regimen, fasting lipid profile will be done in 6 weeks. Essential hypertension actually blood pressures on the lower side. Obstructive sleep apnea to be followed by internal medicine team We did have a long discussion about healthy lifestyle need to exercise on the regular basis as well as good diet and we discussed basic of Mediterranean diet.   Medication Adjustments/Labs and Tests Ordered: Current medicines are reviewed at length with the patient today.  Concerns regarding medicines are outlined above.  No orders of the defined types were placed in this encounter.  Medication changes: No orders of the defined types were placed in this encounter.   Signed, Georgeanna Lea, MD, [facility] 04/11/2022 8:15 AM    [facility]

## 2022-04-17 ENCOUNTER — Encounter: Payer: Self-pay | Admitting: Cardiology

## 2022-04-18 ENCOUNTER — Ambulatory Visit (INDEPENDENT_AMBULATORY_CARE_PROVIDER_SITE_OTHER): Payer: No Typology Code available for payment source | Admitting: Nurse Practitioner

## 2022-04-18 ENCOUNTER — Telehealth: Payer: Self-pay

## 2022-04-18 ENCOUNTER — Other Ambulatory Visit: Payer: Self-pay

## 2022-04-18 ENCOUNTER — Encounter: Payer: Self-pay | Admitting: Nurse Practitioner

## 2022-04-18 ENCOUNTER — Other Ambulatory Visit (HOSPITAL_COMMUNITY): Payer: Self-pay

## 2022-04-18 VITALS — BP 140/70 | HR 63 | Temp 97.1°F | Ht 70.0 in | Wt 181.4 lb

## 2022-04-18 DIAGNOSIS — I2510 Atherosclerotic heart disease of native coronary artery without angina pectoris: Secondary | ICD-10-CM

## 2022-04-18 DIAGNOSIS — F332 Major depressive disorder, recurrent severe without psychotic features: Secondary | ICD-10-CM | POA: Diagnosis not present

## 2022-04-18 DIAGNOSIS — F411 Generalized anxiety disorder: Secondary | ICD-10-CM | POA: Diagnosis not present

## 2022-04-18 MED ORDER — BUPROPION HCL ER (XL) 150 MG PO TB24: 150.0000 mg | ORAL_TABLET | Freq: Every day | ORAL | 0 refills | Status: DC

## 2022-04-18 MED ORDER — ISOSORBIDE MONONITRATE ER 30 MG PO TB24
30.0000 mg | ORAL_TABLET | Freq: Every day | ORAL | 3 refills | Status: DC
  Filled 2022-04-18 – 2022-04-20 (×3): qty 90, 90d supply, fill #0

## 2022-04-18 MED ORDER — RANOLAZINE ER 1000 MG PO TB12
1000.0000 mg | ORAL_TABLET | Freq: Two times a day (BID) | ORAL | 3 refills | Status: DC
  Filled 2022-04-18: qty 180, 90d supply, fill #0
  Filled 2022-07-21: qty 180, 90d supply, fill #1

## 2022-04-18 MED ORDER — RANOLAZINE ER 500 MG PO TB12
500.0000 mg | ORAL_TABLET | Freq: Two times a day (BID) | ORAL | 3 refills | Status: DC
  Filled 2022-04-18: qty 180, 90d supply, fill #0

## 2022-04-18 NOTE — Patient Instructions (Signed)
Begin Wellbutrin 150 mg daily, may take 1/2 tablet for one week, then increase to a whole tablet Recommend counseling Follow-up in 4-weeks   Managing Depression, Adult Depression is a mental health condition that affects your thoughts, feelings, and actions. Being diagnosed with depression can bring you relief if you did not know why you have felt or behaved a certain way. It could also leave you feeling overwhelmed with uncertainty about your future. Preparing yourself to manage your symptoms can help you feel more positive about your future. How to manage lifestyle changes Managing stress  Stress is your body's reaction to life changes and events, both good and bad. Stress can add to your feelings of depression. Learning to manage your stress can help lessen your feelings of depression. Try some of the following approaches to reducing your stress (stress reduction techniques): Listen to music that you enjoy and that inspires you. Try using a meditation app or take a meditation class. Develop a practice that helps you connect with your spiritual self. Walk in nature, pray, or go to a place of worship. Do some deep breathing. To do this, inhale slowly through your nose. Pause at the top of your inhale for a few seconds and then exhale slowly, letting your muscles relax. Practice yoga to help relax and work your muscles. Choose a stress reduction technique that suits your lifestyle and personality. These techniques take time and practice to develop. Set aside 5-15 minutes a day to do them. Therapists can offer training in these techniques. Other things you can do to manage stress include: Keeping a stress diary. Knowing your limits and saying no when you think something is too much. Paying attention to how you react to certain situations. You may not be able to control everything, but you can change your reaction. Adding humor to your life by watching funny films or TV shows. Making time for  activities that you enjoy and that relax you.  Medicines Medicines, such as antidepressants, are often a part of treatment for depression. Talk with your pharmacist or health care provider about all the medicines, supplements, and herbal products that you take, their possible side effects, and what medicines and other products are safe to take together. Make sure to report any side effects you may have to your health care provider. Relationships Your health care provider may suggest family therapy, couples therapy, or individual therapy as part of your treatment. How to recognize changes Everyone responds differently to treatment for depression. As you recover from depression, you may start to: Have more interest in doing activities. Feel less hopeless. Have more energy. Overeat less often, or have a better appetite. Have better mental focus. It is important to recognize if your depression is not getting better or is getting worse. The symptoms you had in the beginning may return, such as: Tiredness (fatigue) or low energy. Eating too much or too little. Sleeping too much or too little. Feeling restless, agitated, or hopeless. Trouble focusing or making decisions. Unexplained physical complaints. Feeling irritable, angry, or aggressive. If you or your family members notice these symptoms coming back, let your health care provider know right away. Follow these instructions at home: Activity  Try to get some form of exercise each day, such as walking, biking, swimming, or lifting weights. Practice stress reduction techniques. Engage your mind by taking a class or doing some volunteer work. Lifestyle Get the right amount and quality of sleep. Cut down on using caffeine, tobacco, alcohol, and other  potentially harmful substances. Eat a healthy diet that includes plenty of vegetables, fruits, whole grains, low-fat dairy products, and lean protein. Do not eat a lot of foods that are high  in solid fats, added sugars, or salt (sodium). General instructions Take over-the-counter and prescription medicines only as told by your health care provider. Keep all follow-up visits as told by your health care provider. This is important. Where to find support Talking to others  Friends and family members can be sources of support and guidance. Talk to trusted friends or family members about your condition. Explain your symptoms to them, and let them know that you are working with a health care provider to treat your depression. Tell friends and family members how they also can be helpful. Finances Find appropriate mental health providers that fit with your financial situation. Talk with your health care provider about options to get reduced prices on your medicines. Where to find more information You can find support in your area from: Anxiety and Depression Association of America (ADAA): www.adaa.org Mental Health America: www.mentalhealthamerica.net [employer] on Mental Illness: www.nami.org Contact a health care provider if: You stop taking your antidepressant medicines, and you have any of these symptoms: Nausea. Headache. Light-headedness. Chills and body aches. Not being able to sleep (insomnia). You or your friends and family think your depression is getting worse. Get help right away if: You have thoughts of hurting yourself or others. If you ever feel like you may hurt yourself or others, or have thoughts about taking your own life, get help right away. Go to your nearest emergency department or: Call your local emergency services (911 in the [place] Call a suicide crisis helpline, such as the National Suicide Prevention Lifeline at (408) 167-2457 or 988 in the [place] This is open 24 hours a day in the [place] Text the Crisis Text Line at 817-526-3202 (in the [place] Summary If you are diagnosed with depression, preparing yourself to manage your symptoms is a good way to feel  positive about your future. Work with your health care provider on a management plan that includes stress reduction techniques, medicines (if applicable), therapy, and healthy lifestyle habits. Keep talking with your health care provider about how your treatment is working. If you have thoughts about taking your own life, call a suicide crisis helpline or text a crisis text line. This information is not intended to replace advice given to you by your health care provider. Make sure you discuss any questions you have with your health care provider. Document Revised: 04/26/2021 Document Reviewed: 08/12/2019 Elsevier Patient Education  2023 [employer].

## 2022-04-18 NOTE — Progress Notes (Signed)
Established Patient Office Visit  Subjective   Patient ID: Jon Gonzales, male    DOB: [birth year]  Age: 61 y.o. MRN: [id number]  CC: Depression  HPI Pt presents for f/u of depression with anxiety symptoms that have worsened over the past several weeks.  Pt states he has experienced increased work stress and health concerns recently. Symptoms include difficulty concentrating and sleeping. He denies suicidal ideations or plans to harm himself. Pt was diagnosed with biploar depression and GAD several years ago. Current treatment includes Lithium, Cymbalta, and Ativan PRN. He is not currently in counseling. Previously seen by Dr Aquilla Solian at [facility].   Pt has been prescribed multiple mental health medications that include: Zoloft, Lexapro, Effexor-XR, Vraylar, Trileptal, Abilify, Buspirone, Trintellix, and possibly Latuda.      04/18/2022   10:08 PM 01/17/2022    4:14 PM  Depression screen PHQ 2/9  Decreased Interest 3 3  Down, Depressed, Hopeless 3 2  PHQ - 2 Score 6 5  Altered sleeping 3 3  Tired, decreased energy 3 1  Change in appetite 2 1  Feeling bad or failure about yourself  3 3  Trouble concentrating 2 2  Moving slowly or fidgety/restless 0 0  Suicidal thoughts 1 1  PHQ-9 Score 20 16  Difficult doing work/chores Somewhat difficult        GAD-7 Results    04/18/2022   10:07 PM  GAD-7 Generalized Anxiety Disorder Screening Tool  1. Feeling Nervous, Anxious, or on Edge 2  2. Not Being Able to Stop or Control Worrying 3  3. Worrying Too Much About Different Things 3  4. Trouble Relaxing 1  5. Being So Restless it's Hard To Sit Still 0  6. Becoming Easily Annoyed or Irritable 2  7. Feeling Afraid As If Something Awful Might Happen 3  Total GAD-7 Score 14  Difficulty At Work, Home, or Getting  Along With Others? Somewhat difficult     Review of Systems  Constitutional:  Positive for malaise/fatigue.  Psychiatric/Behavioral:  Positive for depression. Negative  for suicidal ideas. The patient is nervous/anxious and has insomnia.       Objective:     BP 140/70   Pulse 63   Temp (!) 97.1 F (36.2 C)   Ht 5\' 10"  (1.778 m)   Wt 181 lb 6.4 oz (82.3 kg)   SpO2 98%   BMI 26.03 kg/m    Physical Exam Vitals reviewed.  Skin:    General: Skin is warm and dry.     Capillary Refill: Capillary refill takes less than 2 seconds.  Neurological:     General: No focal deficit present.     Mental Status: He is alert and oriented to person, place, and time.      Assessment & Plan:   1. Severe episode of recurrent major depressive disorder, without psychotic features (HCC)-not at goal - buPROPion (WELLBUTRIN XL) 150 MG 24 hr tablet; Take 1 tablet (150 mg total) by mouth daily.  Dispense: 30 tablet; Refill: 0 -[facility] genetic testing  2. GAD (generalized anxiety disorder)-not at goal -continue Lorazepam 1 mg Q 8 hours PRN   Begin Wellbutrin 150 mg daily, may take 1/2 tablet for one week, then increase to a whole tablet Recommend counseling Follow-up in 4-weeks    I, , NP, have reviewed all documentation for this visit. The documentation on 04/18/22 for the exam, diagnosis, procedures, and orders are all accurate and complete.    Signed, 06/19/22  Dot Lanes, NP 04/18/22 at 10:21 pm

## 2022-04-18 NOTE — Telephone Encounter (Signed)
Imdur 30mg  #90 ref x 3, Ranexa 1000mg  bid #180 ref x 3

## 2022-04-18 NOTE — Progress Notes (Deleted)
   Established Patient Office Visit  Subjective   Patient ID: Jon Gonzales, male    DOB: [birth year]  Age: 61 y.o. MRN: [id number]  CC: Depression  HPI Pt presents for f/u of depression with anxiety symptoms that have worsened over the past several weeks.  Pt states he has experienced increased work stress and health concerns recently. Symptoms include difficulty concentrating and sleeping. He denies suicidal ideations or plans to harm himself. He is not currently in counseling. Previously seen by Dr Aquilla Solian at [facility].   Review of Systems  Constitutional:  Positive for malaise/fatigue and weight loss.  Psychiatric/Behavioral:  Positive for depression. Negative for substance abuse and suicidal ideas. The patient is nervous/anxious and has insomnia.       Objective:     BP 140/70   Pulse 63   Temp (!) 97.1 F (36.2 C)   Ht 5\' 10"  (1.778 m)   Wt 181 lb 6.4 oz (82.3 kg)   SpO2 98%   BMI 26.03 kg/m          Assessment & Plan:   1. Severe episode of recurrent major depressive disorder, without psychotic features (HCC)-not at goal - buPROPion (WELLBUTRIN XL) 150 MG 24 hr tablet; Take 1 tablet (150 mg total) by mouth daily.  Dispense: 30 tablet; Refill: 0  2. GAD (generalized anxiety disorder)-not at goal          , NP

## 2022-04-19 ENCOUNTER — Other Ambulatory Visit (HOSPITAL_COMMUNITY): Payer: Self-pay

## 2022-04-20 ENCOUNTER — Other Ambulatory Visit (HOSPITAL_COMMUNITY): Payer: Self-pay

## 2022-04-21 ENCOUNTER — Other Ambulatory Visit (HOSPITAL_COMMUNITY): Payer: Self-pay

## 2022-04-23 ENCOUNTER — Encounter: Payer: Self-pay | Admitting: Nurse Practitioner

## 2022-05-03 ENCOUNTER — Other Ambulatory Visit: Payer: Self-pay | Admitting: Nurse Practitioner

## 2022-05-03 ENCOUNTER — Other Ambulatory Visit (HOSPITAL_COMMUNITY): Payer: Self-pay

## 2022-05-03 ENCOUNTER — Other Ambulatory Visit: Payer: Self-pay | Admitting: Legal Medicine

## 2022-05-03 DIAGNOSIS — F332 Major depressive disorder, recurrent severe without psychotic features: Secondary | ICD-10-CM

## 2022-05-03 MED ORDER — DULOXETINE HCL 60 MG PO CPEP
ORAL_CAPSULE | Freq: Two times a day (BID) | ORAL | 2 refills | Status: DC
Start: 2022-05-03 — End: 2022-09-22
  Filled 2022-05-03: qty 180, 90d supply, fill #0
  Filled 2022-08-11: qty 180, 90d supply, fill #1

## 2022-05-15 ENCOUNTER — Other Ambulatory Visit (HOSPITAL_COMMUNITY): Payer: Self-pay

## 2022-05-16 ENCOUNTER — Other Ambulatory Visit: Payer: Self-pay | Admitting: Legal Medicine

## 2022-05-16 DIAGNOSIS — F319 Bipolar disorder, unspecified: Secondary | ICD-10-CM

## 2022-05-17 ENCOUNTER — Other Ambulatory Visit (HOSPITAL_COMMUNITY): Payer: Self-pay

## 2022-05-17 MED ORDER — LITHIUM CARBONATE ER 300 MG PO TBCR
EXTENDED_RELEASE_TABLET | Freq: Two times a day (BID) | ORAL | 2 refills | Status: DC
  Filled 2022-05-17: qty 180, 90d supply, fill #0
  Filled 2022-08-11: qty 180, 90d supply, fill #1

## 2022-05-23 ENCOUNTER — Other Ambulatory Visit: Payer: Self-pay | Admitting: Nurse Practitioner

## 2022-05-23 DIAGNOSIS — F332 Major depressive disorder, recurrent severe without psychotic features: Secondary | ICD-10-CM

## 2022-05-24 ENCOUNTER — Other Ambulatory Visit: Payer: Self-pay | Admitting: Nurse Practitioner

## 2022-05-24 ENCOUNTER — Encounter: Payer: Self-pay | Admitting: Cardiology

## 2022-05-24 ENCOUNTER — Ambulatory Visit (INDEPENDENT_AMBULATORY_CARE_PROVIDER_SITE_OTHER): Payer: No Typology Code available for payment source | Admitting: Cardiology

## 2022-05-24 ENCOUNTER — Other Ambulatory Visit (HOSPITAL_COMMUNITY): Payer: Self-pay

## 2022-05-24 VITALS — BP 108/68 | HR 82 | Ht 70.0 in | Wt 176.0 lb

## 2022-05-24 DIAGNOSIS — I2510 Atherosclerotic heart disease of native coronary artery without angina pectoris: Secondary | ICD-10-CM | POA: Diagnosis not present

## 2022-05-24 DIAGNOSIS — R9439 Abnormal result of other cardiovascular function study: Secondary | ICD-10-CM | POA: Insufficient documentation

## 2022-05-24 DIAGNOSIS — I10 Essential (primary) hypertension: Secondary | ICD-10-CM

## 2022-05-24 DIAGNOSIS — E7800 Pure hypercholesterolemia, unspecified: Secondary | ICD-10-CM

## 2022-05-24 DIAGNOSIS — F411 Generalized anxiety disorder: Secondary | ICD-10-CM

## 2022-05-24 HISTORY — DX: Abnormal result of other cardiovascular function study: R94.39

## 2022-05-24 MED ORDER — ISOSORBIDE MONONITRATE ER 60 MG PO TB24
60.0000 mg | ORAL_TABLET | Freq: Every day | ORAL | 3 refills | Status: DC
  Filled 2022-05-24: qty 90, 90d supply, fill #0
  Filled 2022-08-17: qty 90, 90d supply, fill #1

## 2022-05-24 MED ORDER — BUPROPION HCL ER (XL) 150 MG PO TB24
150.0000 mg | ORAL_TABLET | Freq: Every day | ORAL | 0 refills | Status: DC
  Filled 2022-05-24: qty 30, 30d supply, fill #0

## 2022-05-24 MED ORDER — LORAZEPAM 1 MG PO TABS
1.0000 mg | ORAL_TABLET | Freq: Three times a day (TID) | ORAL | 1 refills | Status: DC | PRN
  Filled 2022-05-24: qty 90, 30d supply, fill #0
  Filled 2022-06-21: qty 90, 30d supply, fill #1

## 2022-05-24 NOTE — Progress Notes (Signed)
Cardiology Office Note:    Date:  05/24/2022   ID:  MAVRIC CORTRIGHT, DOB 01/23/1961, MRN [id number]  PCP:  [name], MD  Cardiologist:  [name], MD    Referring MD: [name],*   Chief Complaint  Patient presents with   Follow-up  Doing fine  History of Present Illness:    Jon Gonzales is a 61 y.o. male   with past medical history significant for premature coronary artery disease.  In 2012 he got coronary bypass graft with 3 vessels LIMA to LAD SVG diagonal SVG to obtuse marginal.  In January 2021 he required drug-eluting stent to native diagonal branch.  He also had history of depression, anxiety, essential hypertension, dyslipidemia.  Also recently he started having some difficulty hearing to some suspicion for Mnire's disease. He presented to my office in May with chief complaint of chest pain.  Since that time he did have a stress test done stress to show ischemia only mild and small area involving inferior wall, prior cardiac catheterization from 2021 show RCA lesion however was only 50%, we gradually try to put him on medication he said since he seen him last time he have to use nitroglycerin only once In about 3 weeks ago he said the episode of chest pain happens every few weeks.  We had a long discussion about what to do with the situation.  I think we have a level that appropriate guideline directed medical therapy need to be maximized.  However because of his lack of exercises it is difficult to objectively assess the effectiveness of the treatment.  Therefore I suggested to start walking on the regular basis I told him at least half an hour 5 times a week that should be a minimum.  He comes today 2 months for follow-up he started exercises and the regular basis.  He noticed somewhat improving his ability to exercise but does get shortness of breath.  He denies have any chest pain tightness squeezing pressure burning chest.  Past Medical History:   Diagnosis Date   Abscess of buttock 11/23/2020   Angina pectoris (HCC) 10/26/2019   Anxiety, generalized 05/22/2016   Bilateral leg edema 01/29/2020   CAD (coronary artery disease)    Cath 2013 LAD 95% stenosis, D1 90% stenosis, RCA 20%, Circ 30%.  Previous stent to D1 2012.   EF 60%.     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   Other fatigue 10/27/2020   Paranoid reaction, acute (HCC) 05/22/2016   Paronychia of finger, left 08/22/2020   PLMD (periodic limb movement disorder) 09/11/2016   PUD (peptic ulcer disease)    Screening for prostate cancer 05/22/2016   Severe episode of recurrent major depressive disorder, without psychotic features (HCC) 09/11/2016   Snoring 09/11/2016   TIA (transient ischemic attack)     Past Surgical History:  Procedure Laterality Date   CHOLECYSTECTOMY     CORONARY ARTERY BYPASS GRAFT  02/19/12   L - LAD, SVG - D1, SVG - OM   CORONARY STENT INTERVENTION N/A 11/05/2019   Procedure: CORONARY STENT INTERVENTION;  Surgeon: Runell Gess, MD;  Location: [facility];  Service: Cardiovascular;  Laterality: N/A;  SVG to DIAG   LEFT HEART CATH AND CORS/GRAFTS ANGIOGRAPHY N/A 11/05/2019   Procedure: LEFT HEART CATH AND CORS/GRAFTS ANGIOGRAPHY;  Surgeon: Runell Gess, MD;  Location: [facility];  Service: Cardiovascular;  Laterality: N/A;   RENAL ANGIOGRAPHY N/A 11/05/2019   Procedure: RENAL ANGIOGRAPHY;  Surgeon: Runell Gess, MD;  Location: [facility];  Service: Cardiovascular;  Laterality: N/A;   TONSILLECTOMY AND ADENOIDECTOMY     WRIST SURGERY      Current Medications: Current Meds  Medication Sig   aspirin EC 81 MG tablet Take 1 tablet (81 mg total) by mouth daily.   buPROPion (WELLBUTRIN XL) 150 MG 24 hr tablet Take 1 tablet (150 mg total) by mouth daily.   clopidogrel (PLAVIX) 75 MG tablet TAKE 1 TABLET BY MOUTH ONCE DAILY (Patient taking differently: Take 75 mg by mouth daily.)   DULoxetine (CYMBALTA) 60 MG capsule TAKE 1 CAPSULE BY MOUTH 2 TIMES DAILY (Patient taking differently: Take 60 mg by mouth 2 (two) times daily.)   Eszopiclone 3 MG TABS TAKE 1 TABLET BY MOUTH IMMEDIATELY BEFORE BEDTIME AS NEEDED FOR SLEEP (Patient taking differently: Take 3 mg by mouth at bedtime.)   ezetimibe (ZETIA) 10 MG tablet Take 1 tablet (10 mg total) by mouth daily.   fexofenadine (ALLEGRA) 60 MG tablet Take 60 mg by mouth daily.   fluticasone (FLONASE) 50 MCG/ACT nasal spray Place 2 sprays into both nostrils daily.   hydrochlorothiazide (HYDRODIURIL) 12.5 MG tablet Take 1 tablet (12.5 mg total) by mouth daily.   isosorbide mononitrate (IMDUR) 30 MG 24 hr tablet Take 1 tablet by mouth daily.   lidocaine (XYLOCAINE) 2 % solution Use as directed 15 mLs in the mouth or throat every 6 (six) hours as needed for mouth pain.   lithium carbonate (LITHOBID) 300 MG CR tablet TAKE 1 TABLET BY MOUTH TWICE DAILY (Patient taking differently: Take 300 mg by mouth 2 (two) times daily.)   metoprolol succinate (TOPROL-XL) 50 MG 24 hr tablet Take 1 tablet by mouth daily. Take with or immediately following a meal.   Multiple Vitamin (MULTIVITAMIN WITH MINERALS) TABS tablet Take 1 tablet by mouth daily. Men's Multivitamin   neomycin-polymyxin-hydrocortisone (CORTISPORIN) OTIC solution Place 3 drops into the left ear 4 (four) times daily.   nitroGLYCERIN (NITROSTAT) 0.4 MG SL tablet Dissolve 1 tablet (0.4 mg total) under the  tongue every 5 (five) minutes up to 3 doses as needed for chest pain. If no relief after 3 doses call 911.   pantoprazole (PROTONIX) 40 MG tablet Take 1 tablet by mouth daily.   ranolazine (RANEXA) 1000 MG SR tablet Take 1 tablet by mouth 2 times daily.   rosuvastatin (CRESTOR) 40 MG tablet Take 1 tablet (40 mg total) by mouth daily.   Semaglutide-Weight Management 2.4 MG/0.75ML SOAJ Inject 2.4 mg into the skin once a week.   [DISCONTINUED] diltiazem (CARDIZEM SR) 60 MG 12 hr capsule Take 1 capsule (60 mg total) by mouth 2 (two) times daily.   [DISCONTINUED] pantoprazole (PROTONIX) 40 MG tablet Take 1 tablet (40 mg total) by mouth daily.     Allergies:   Trintellix [vortioxetine] and Penicillins   Social History   Socioeconomic History   Marital status:  Married    Spouse name: Not on file   Number of children: Not on file   Years of education: Not on file   Highest education level: Bachelor's degree (e.g., BA, AB, BS)  Occupational History   Not on file  Tobacco Use   Smoking status: Never   Smokeless tobacco: Never  Vaping Use   Vaping Use: Never used  Substance and Sexual Activity   Alcohol use: Yes    Alcohol/week: 6.0 standard drinks of alcohol    Types: 6 Cans of beer per week   Drug use: Not on file   Sexual activity: Not on file  Other Topics Concern   Not on file  Social History Narrative   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     Social Determinants of Health   Financial Resource Strain: Not on file  Food Insecurity: Not on file  Transportation Needs: Not on file  Physical Activity: Not on file  Stress: Not on file  Social Connections: Not on file     Family History: The patient's family history includes Aneurysm (age of onset: 66) in his mother; CAD (age of onset: 12) in his father; Healthy in his brother; Heart disease in his sister. ROS:   Please see the history of present illness.    All 14 point review of systems  negative except as described per history of present illness  EKGs/Labs/Other Studies Reviewed:      Recent Labs: 10/27/2021: TSH 1.620 01/17/2022: ALT 37; BUN 15; Creatinine, Ser 1.13; Hemoglobin 16.0; Platelets 172; Potassium 3.5; Sodium 138  Recent Lipid Panel    Component Value Date/Time   CHOL 137 02/28/2022 1613   TRIG 147 02/28/2022 1613   HDL 48 02/28/2022 1613   CHOLHDL 2.9 02/28/2022 1613   LDLCALC 64 02/28/2022 1613    Physical Exam:    VS:  BP 108/68 (BP Location: Left Arm, Patient Position: Sitting)   Pulse 82   Ht 5\' 10"  (1.778 m)   Wt 176 lb (79.8 kg)   SpO2 95%   BMI 25.25 kg/m     Wt Readings from Last 3 Encounters:  05/24/22 176 lb (79.8 kg)  04/18/22 181 lb 6.4 oz (82.3 kg)  04/11/22 177 lb 12.8 oz (80.6 kg)     GEN:  Well nourished, well developed in no acute distress HEENT: Normal NECK: No JVD; No carotid bruits LYMPHATICS: No lymphadenopathy CARDIAC: RRR, no murmurs, no rubs, no gallops RESPIRATORY:  Clear to auscultation without rales, wheezing or rhonchi  ABDOMEN: Soft, non-tender, non-distended MUSCULOSKELETAL:  No edema; No deformity  SKIN: Warm and dry LOWER EXTREMITIES: no swelling NEUROLOGIC:  Alert and oriented x 3 PSYCHIATRIC:  Normal affect   ASSESSMENT:    1. Coronary artery disease involving native coronary artery of native heart without angina pectoris   2. Primary hypertension   3. Hypercholesterolemia   4. Abnormal stress test small area of mild ischemia inferior wall    PLAN:    In order of problems listed above:  Coronary disease.  Mildly abnormal stress test with inferior wall defect however he is does not have any typical symptoms.  I put him on antianginal therapy, he is already on antiplatelet therapy.  Will continue with that I will increase dose of Imdur from 30 to 60 mg daily.  I warned him about potential side effects which can include dropping her blood pressure.  No headache.  He understands and if  he develops  side effects then he will go back to 30 mg.  I Encouraged him to exercise on the regular basis.  He actually looks very optimistic and cheerful today we had a very nice conversation. Essential hypertension blood pressure well-controlled.  Continue present management. Dyslipidemia: I did review K PN from May LDL 64 HDL 48.  He is on high intense statin Crestor 40 which I will continue. Depression: Stable.   Medication Adjustments/Labs and Tests Ordered: Current medicines are reviewed at length with the patient today.  Concerns regarding medicines are outlined above.  No orders of the defined types were placed in this encounter.  Medication changes: No orders of the defined types were placed in this encounter.   Signed, Georgeanna Lea, MD, [facility] 05/24/2022 8:19 AM    [facility]

## 2022-05-24 NOTE — Addendum Note (Signed)
Addended by: Baldo Ash D on: 05/24/2022 08:36 AM   Modules accepted: Orders

## 2022-05-24 NOTE — Patient Instructions (Signed)
Medication Instructions:  Your physician has recommended you make the following change in your medication:   Increase Imdur from 30mg  to 60mg  daily. You may double up on your current dose until your next refill and then you next refill will be 60mg  tablets    Lab Work: None Ordered If you have labs (blood work) drawn today and your tests are completely normal, you will receive your results only by: MyChart Message (if you have MyChart) OR A paper copy in the mail If you have any lab test that is abnormal or we need to change your treatment, we will call you to review the results.   Testing/Procedures: None Ordered   Follow-Up: At [facility], you and your health needs are our priority.  As part of our continuing mission to provide you with exceptional heart care, we have created designated Provider Care Teams.  These Care Teams include your primary Cardiologist (physician) and Advanced Practice Providers (APPs -  Physician Assistants and Nurse Practitioners) who all work together to provide you with the care you need, when you need it.  We recommend signing up for the patient portal called "MyChart".  Sign up information is provided on this After Visit Summary.  MyChart is used to connect with patients for Virtual Visits (Telemedicine).  Patients are able to view lab/test results, encounter notes, upcoming appointments, etc.  Non-urgent messages can be sent to your provider as well.   To learn more about what you can do with MyChart, go to .    Your next appointment:   4 month(s)  The format for your next appointment:   In Person  Provider:   , MD    Other Instructions NA

## 2022-06-04 ENCOUNTER — Ambulatory Visit (INDEPENDENT_AMBULATORY_CARE_PROVIDER_SITE_OTHER): Payer: No Typology Code available for payment source | Admitting: Nurse Practitioner

## 2022-06-04 ENCOUNTER — Encounter: Payer: Self-pay | Admitting: Nurse Practitioner

## 2022-06-04 VITALS — BP 112/68 | HR 74 | Temp 97.1°F | Ht 70.0 in | Wt 178.0 lb

## 2022-06-04 DIAGNOSIS — Z23 Encounter for immunization: Secondary | ICD-10-CM | POA: Diagnosis not present

## 2022-06-04 DIAGNOSIS — Z6825 Body mass index (BMI) 25.0-25.9, adult: Secondary | ICD-10-CM

## 2022-06-04 DIAGNOSIS — Z1211 Encounter for screening for malignant neoplasm of colon: Secondary | ICD-10-CM

## 2022-06-04 DIAGNOSIS — Z125 Encounter for screening for malignant neoplasm of prostate: Secondary | ICD-10-CM | POA: Diagnosis not present

## 2022-06-04 DIAGNOSIS — Z0000 Encounter for general adult medical examination without abnormal findings: Secondary | ICD-10-CM | POA: Diagnosis not present

## 2022-06-04 NOTE — Patient Instructions (Addendum)
We will call you with lab results and colonoscopy referral  Shingrix vaccine given in office today Tdap vaccine to be given Friday, August 25th, 2023 Follow-up in 1 year   Zoster Vaccine, Recombinant injection What is this medication? ZOSTER VACCINE (ZOS ter vak SEEN) is a vaccine used to reduce the risk of getting shingles. This vaccine is not used to treat shingles or nerve pain from shingles. This medicine may be used for other purposes; ask your health care provider or pharmacist if you have questions. COMMON BRAND NAME(S): [facility] What should I tell my care team before I take this medication? They need to know if you have any of these conditions: cancer immune system problems an unusual or allergic reaction to Zoster vaccine, other medications, foods, dyes, or preservatives pregnant or trying to get pregnant breast-feeding How should I use this medication? This vaccine is injected into a muscle. It is given by a health care provider. A copy of Vaccine Information Statements will be given before each vaccination. Be sure to read this information carefully each time. This sheet may change often. Talk to your health care provider about the use of this vaccine in children. This vaccine is not approved for use in children. Overdosage: If you think you have taken too much of this medicine contact a poison control center or emergency room at once. NOTE: This medicine is only for you. Do not share this medicine with others. What if I miss a dose? Keep appointments for follow-up (booster) doses. It is important not to miss your dose. Call your health care provider if you are unable to keep an appointment. What may interact with this medication? medicines that suppress your immune system medicines to treat cancer steroid medicines like prednisone or cortisone This list may not describe all possible interactions. Give your health care provider a list of all the medicines, herbs,  non-prescription drugs, or dietary supplements you use. Also tell them if you smoke, drink alcohol, or use illegal drugs. Some items may interact with your medicine. What should I watch for while using this medication? Visit your health care provider regularly. This vaccine, like all vaccines, may not fully protect everyone. What side effects may I notice from receiving this medication? Side effects that you should report to your doctor or health care professional as soon as possible: allergic reactions (skin rash, itching or hives; swelling of the face, lips, or tongue) trouble breathing Side effects that usually do not require medical attention (report these to your doctor or health care professional if they continue or are bothersome): chills headache fever nausea pain, redness, or irritation at site where injected tiredness vomiting This list may not describe all possible side effects. Call your doctor for medical advice about side effects. You may report side effects to FDA at 1-800-FDA-1088. Where should I keep my medication? This vaccine is only given by a health care provider. It will not be stored at home. NOTE: This sheet is a summary. It may not cover all possible information. If you have questions about this medicine, talk to your doctor, pharmacist, or health care provider.  2023 Elsevier/Gold Standard (2021-09-01 00:00:00)    Health Maintenance, Male Adopting a healthy lifestyle and getting preventive care are important in promoting health and wellness. Ask your health care provider about: The right schedule for you to have regular tests and exams. Things you can do on your own to prevent diseases and keep yourself healthy. What should I know about diet, weight, and  exercise? Eat a healthy diet  Eat a diet that includes plenty of vegetables, fruits, low-fat dairy products, and lean protein. Do not eat a lot of foods that are high in solid fats, added sugars, or  sodium. Maintain a healthy weight Body mass index (BMI) is a measurement that can be used to identify possible weight problems. It estimates body fat based on height and weight. Your health care provider can help determine your BMI and help you achieve or maintain a healthy weight. Get regular exercise Get regular exercise. This is one of the most important things you can do for your health. Most adults should: Exercise for at least 150 minutes each week. The exercise should increase your heart rate and make you sweat (moderate-intensity exercise). Do strengthening exercises at least twice a week. This is in addition to the moderate-intensity exercise. Spend less time sitting. Even light physical activity can be beneficial. Watch cholesterol and blood lipids Have your blood tested for lipids and cholesterol at 60 years of age, then have this test every 5 years. You may need to have your cholesterol levels checked more often if: Your lipid or cholesterol levels are high. You are older than 60 years of age. You are at high risk for heart disease. What should I know about cancer screening? Many types of cancers can be detected early and may often be prevented. Depending on your health history and family history, you may need to have cancer screening at various ages. This may include screening for: Colorectal cancer. Prostate cancer. Skin cancer. Lung cancer. What should I know about heart disease, diabetes, and high blood pressure? Blood pressure and heart disease High blood pressure causes heart disease and increases the risk of stroke. This is more likely to develop in people who have high blood pressure readings or are overweight. Talk with your health care provider about your target blood pressure readings. Have your blood pressure checked: Every 3-5 years if you are 60-65 years of age. Every year if you are 79 years old or older. If you are between the ages of 72 and 11 and are a current  or former smoker, ask your health care provider if you should have a one-time screening for abdominal aortic aneurysm (AAA). Diabetes Have regular diabetes screenings. This checks your fasting blood sugar level. Have the screening done: Once every three years after age 66 if you are at a normal weight and have a low risk for diabetes. More often and at a younger age if you are overweight or have a high risk for diabetes. What should I know about preventing infection? Hepatitis B If you have a higher risk for hepatitis B, you should be screened for this virus. Talk with your health care provider to find out if you are at risk for hepatitis B infection. Hepatitis C Blood testing is recommended for: Everyone born from [birth year] through 1964. Anyone with known risk factors for hepatitis C. Sexually transmitted infections (STIs) You should be screened each year for STIs, including gonorrhea and chlamydia, if: You are sexually active and are younger than 60 years of age. You are older than 60 years of age and your health care provider tells you that you are at risk for this type of infection. Your sexual activity has changed since you were last screened, and you are at increased risk for chlamydia or gonorrhea. Ask your health care provider if you are at risk. Ask your health care provider about whether you are at high  risk for HIV. Your health care provider may recommend a prescription medicine to help prevent HIV infection. If you choose to take medicine to prevent HIV, you should first get tested for HIV. You should then be tested every 3 months for as long as you are taking the medicine. Follow these instructions at home: Alcohol use Do not drink alcohol if your health care provider tells you not to drink. If you drink alcohol: Limit how much you have to 0-2 drinks a day. Know how much alcohol is in your drink. In the [place] one drink equals one 12 oz bottle of beer (355 mL), one 5 oz glass of wine  (148 mL), or one 1 oz glass of hard liquor (44 mL). Lifestyle Do not use any products that contain nicotine or tobacco. These products include cigarettes, chewing tobacco, and vaping devices, such as e-cigarettes. If you need help quitting, ask your health care provider. Do not use street drugs. Do not share needles. Ask your health care provider for help if you need support or information about quitting drugs. General instructions Schedule regular health, dental, and eye exams. Stay current with your vaccines. Tell your health care provider if: You often feel depressed. You have ever been abused or do not feel safe at home. Summary Adopting a healthy lifestyle and getting preventive care are important in promoting health and wellness. Follow your health care provider's instructions about healthy diet, exercising, and getting tested or screened for diseases. Follow your health care provider's instructions on monitoring your cholesterol and blood pressure. This information is not intended to replace advice given to you by your health care provider. Make sure you discuss any questions you have with your health care provider. Document Revised: 02/20/2021 Document Reviewed: 02/20/2021 Elsevier Patient Education  2023 [employer].

## 2022-06-04 NOTE — Progress Notes (Signed)
Subjective:  Patient ID: Jon Gonzales, male    DOB: October 16, 1960  Age: 61 y.o. MRN: [id number]  Chief Complaint  Patient presents with   Annual Exam    HPI Encounter for general adult medical examination without abnormal findings  Physical ("At Risk" items are starred): Patient's last physical exam was 1 year ago .      SDOH Screenings   Alcohol Screen: Low Risk  (06/04/2022)   Alcohol Screen    Last Alcohol Screening Score (AUDIT): 1  Depression (PHQ2-9): High Risk (04/18/2022)   Depression (PHQ2-9)    PHQ-2 Score: 20  Financial Resource Strain: Low Risk  (06/04/2022)   Overall Financial Resource Strain (CARDIA)    Difficulty of Paying Living Expenses: Not hard at all  Food Insecurity: No Food Insecurity (06/04/2022)   Hunger Vital Sign    Worried About Running Out of Food in the Last Year: Never true    Ran Out of Food in the Last Year: Never true  Housing: Low Risk  (06/04/2022)   Housing    Last Housing Risk Score: 0  Physical Activity: Inactive (06/04/2022)   Exercise Vital Sign    Days of Exercise per Week: 0 days    Minutes of Exercise per Session: 0 min  Social Connections: Moderately Integrated (06/04/2022)   Social Connection and Isolation Panel [NHANES]    Frequency of Communication with Friends and Family: More than three times a week    Frequency of Social Gatherings with Friends and Family: More than three times a week    Attends Religious Services: Never    Database administratorActive Member of Clubs or Organizations: Yes    Attends Engineer, structuralClub or Organization Meetings: More than 4 times per year    Marital Status: Married  Stress: No Stress Concern Present (06/04/2022)   [employer] - Occupational Stress Questionnaire    Feeling of Stress : Not at all  Tobacco Use: Low Risk  (05/24/2022)   Patient History    Smoking Tobacco Use: Never    Smokeless Tobacco Use: Never    Passive Exposure: Not on file  Transportation Needs: No Transportation Needs (06/04/2022)    PRAPARE - Transportation    Lack of Transportation (Medical): No    Lack of Transportation (Non-Medical): No       06/04/2022    8:42 AM 01/17/2022    4:14 PM 07/21/2019    8:52 AM  Fall Risk   Falls in the past year? 0 0 0  Number falls in past yr: 0 0 0  Injury with Fall? 0 0 0  Risk for fall due to : No Fall Risks    Follow up Falls evaluation completed         04/18/2022   10:08 PM 01/17/2022    4:14 PM  Depression screen PHQ 2/9  Decreased Interest 3 3  Down, Depressed, Hopeless 3 2  PHQ - 2 Score 6 5  Altered sleeping 3 3  Tired, decreased energy 3 1  Change in appetite 2 1  Feeling bad or failure about yourself  3 3  Trouble concentrating 2 2  Moving slowly or fidgety/restless 0 0  Suicidal thoughts 1 1  PHQ-9 Score 20 16  Difficult doing work/chores Somewhat difficult     Functional Status Survey: Is the patient deaf or have difficulty hearing?: No Does the patient have difficulty seeing, even when wearing glasses/contacts?: No Does the patient have difficulty concentrating, remembering, or making decisions?: No Does the  patient have difficulty walking or climbing stairs?: No Does the patient have difficulty dressing or bathing?: No Does the patient have difficulty doing errands alone such as visiting a doctor's office or shopping?: No   Safety: reviewed ;  Patient wears a seat belt. Patient's home has smoke detectors and carbon monoxide detectors. Patient practices appropriate gun safety Patient wears sunscreen with extended sun exposure. Dental Care: biannual cleanings, brushes and flosses daily. Ophthalmology/Optometry: Annual visit.  Hearing loss: none Vision impairments:Glass Patient is not afflicted from Stress Incontinence and Urge Incontinence   Current Outpatient Medications on File Prior to Visit  Medication Sig Dispense Refill   aspirin EC 81 MG tablet Take 1 tablet (81 mg total) by mouth daily. 30 tablet 0   buPROPion (WELLBUTRIN XL) 150 MG 24 hr  tablet Take 1 tablet (150 mg total) by mouth daily. 30 tablet 0   clopidogrel (PLAVIX) 75 MG tablet TAKE 1 TABLET BY MOUTH ONCE DAILY (Patient taking differently: Take 75 mg by mouth daily.) 90 tablet 2   DULoxetine (CYMBALTA) 60 MG capsule TAKE 1 CAPSULE BY MOUTH 2 TIMES DAILY (Patient taking differently: Take 60 mg by mouth 2 (two) times daily.) 180 capsule 2   Eszopiclone 3 MG TABS TAKE 1 TABLET BY MOUTH IMMEDIATELY BEFORE BEDTIME AS NEEDED FOR SLEEP (Patient taking differently: Take 3 mg by mouth at bedtime.) 90 tablet 2   ezetimibe (ZETIA) 10 MG tablet Take 1 tablet (10 mg total) by mouth daily. 90 tablet 3   fexofenadine (ALLEGRA) 60 MG tablet Take 60 mg by mouth daily.     fluticasone (FLONASE) 50 MCG/ACT nasal spray Place 2 sprays into both nostrils daily. 16 g 6   hydrochlorothiazide (HYDRODIURIL) 12.5 MG tablet Take 1 tablet (12.5 mg total) by mouth daily. 90 tablet 1   isosorbide mononitrate (IMDUR) 60 MG 24 hr tablet Take 1 tablet (60 mg total) by mouth daily. 90 tablet 3   lidocaine (XYLOCAINE) 2 % solution Use as directed 15 mLs in the mouth or throat every 6 (six) hours as needed for mouth pain. 100 mL 0   lithium carbonate (LITHOBID) 300 MG CR tablet TAKE 1 TABLET BY MOUTH TWICE DAILY (Patient taking differently: Take 300 mg by mouth 2 (two) times daily.) 180 tablet 2   LORazepam (ATIVAN) 1 MG tablet Take 1 tablet (1 mg total) by mouth every 8 (eight) hours as needed for anxiety. 90 tablet 1   metoprolol succinate (TOPROL-XL) 50 MG 24 hr tablet Take 1 tablet by mouth daily. Take with or immediately following a meal. 90 tablet 3   Multiple Vitamin (MULTIVITAMIN WITH MINERALS) TABS tablet Take 1 tablet by mouth daily. Men's Multivitamin     neomycin-polymyxin-hydrocortisone (CORTISPORIN) OTIC solution Place 3 drops into the left ear 4 (four) times daily. 10 mL 0   nitroGLYCERIN (NITROSTAT) 0.4 MG SL tablet Dissolve 1 tablet (0.4 mg total) under the tongue every 5 (five) minutes up to 3  doses as needed for chest pain. If no relief after 3 doses call 911. 25 tablet 1   pantoprazole (PROTONIX) 40 MG tablet Take 1 tablet by mouth daily. 90 tablet 3   ranolazine (RANEXA) 1000 MG SR tablet Take 1 tablet by mouth 2 times daily. 180 tablet 3   rosuvastatin (CRESTOR) 40 MG tablet Take 1 tablet (40 mg total) by mouth daily. 30 tablet 2   Semaglutide-Weight Management 2.4 MG/0.75ML SOAJ Inject 2.4 mg into the skin once a week. 3 mL 2   [DISCONTINUED] diltiazem (  CARDIZEM SR) 60 MG 12 hr capsule Take 1 capsule (60 mg total) by mouth 2 (two) times daily. 180 capsule 2   [DISCONTINUED] pantoprazole (PROTONIX) 40 MG tablet Take 1 tablet (40 mg total) by mouth daily. 30 tablet 3   [DISCONTINUED] triamterene-hydrochlorothiazide (DYAZIDE) 37.5-25 MG capsule Take 1 each (1 capsule total) by mouth daily. 90 capsule 2   No current facility-administered medications on file prior to visit.    Social Hx   Social History   Socioeconomic History   Marital status: Married    Spouse name: Not on file   Number of children: Not on file   Years of education: Not on file   Highest education level: Bachelor's degree (e.g., BA, AB, BS)  Occupational History   Not on file  Tobacco Use   Smoking status: Never   Smokeless tobacco: Never  Vaping Use   Vaping Use: Never used  Substance and Sexual Activity   Alcohol use: Yes    Alcohol/week: 6.0 standard drinks of alcohol    Types: 6 Cans of beer per week   Drug use: Not on file   Sexual activity: Not on file  Other Topics Concern   Not on file  Social History Narrative   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     Social Determinants of Health   Financial Resource Strain: Low Risk  (06/04/2022)   Overall Financial Resource Strain (CARDIA)    Difficulty of Paying Living Expenses: Not hard at all  Food Insecurity: No Food Insecurity (06/04/2022)   Hunger Vital Sign    Worried About Running Out of Food in the  Last Year: Never true    Ran Out of Food in the Last Year: Never true  Transportation Needs: No Transportation Needs (06/04/2022)   PRAPARE - Administrator, Civil Service (Medical): No    Lack of Transportation (Non-Medical): No  Physical Activity: Inactive (06/04/2022)   Exercise Vital Sign    Days of Exercise per Week: 0 days    Minutes of Exercise per Session: 0 min  Stress: No Stress Concern Present (06/04/2022)   [employer] of Occupational Health - Occupational Stress Questionnaire    Feeling of Stress : Not at all  Social Connections: Moderately Integrated (06/04/2022)   Social Connection and Isolation Panel [NHANES]    Frequency of Communication with Friends and Family: More than three times a week    Frequency of Social Gatherings with Friends and Family: More than three times a week    Attends Religious Services: Never    Database administrator or Organizations: Yes    Attends Banker Meetings: More than 4 times per year    Marital Status: Married   Past Medical History:  Diagnosis Date   Abscess of buttock 11/23/2020   Angina pectoris (HCC) 10/26/2019   Anxiety, generalized 05/22/2016   Bilateral leg edema 01/29/2020   CAD (coronary artery disease)    Cath 2013 LAD 95% stenosis, D1 90% stenosis, RCA 20%, Circ 30%.  Previous stent to D1 2012.   EF 60%.     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   Other fatigue 10/27/2020    Paranoid reaction, acute (HCC) 05/22/2016   Paronychia of finger, left 08/22/2020   PLMD (periodic limb movement disorder) 09/11/2016   PUD (peptic ulcer disease)    Screening for prostate cancer 05/22/2016   Severe episode of recurrent major depressive disorder, 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  HENT:  Negative for congestion, ear pain and sore throat.   Respiratory:  Negative for cough and shortness of breath.   Cardiovascular:  Negative for chest pain and leg swelling.  Gastrointestinal:  Negative for abdominal pain, constipation, diarrhea, nausea and vomiting.  Genitourinary:  Negative for dysuria and urgency.  Musculoskeletal:  Negative for arthralgias and myalgias.  Skin:        Dry skin  Neurological:  Negative for dizziness and headaches.  Psychiatric/Behavioral:  Negative for dysphoric mood.      Objective:  BP 112/68   Pulse 74   Temp (!) 97.1 F (36.2 C)   Ht 5\' 10"  (1.778 m)   Wt 178 lb (80.7 kg)   SpO2 99%   BMI 25.54 kg/m      06/04/2022    8:40 AM 05/24/2022    8:02 AM 04/18/2022    4:12 PM  BP/Weight  Systolic BP 112 108 140  Diastolic BP 68 68 70  Wt. (Lbs) 178 176 181.4  BMI 25.54 kg/m2 25.25 kg/m2 26.03 kg/m2    Physical Exam Vitals reviewed.  Constitutional:      Appearance: Normal appearance.  HENT:     Right Ear: Tympanic membrane normal.     Left Ear: Tympanic membrane normal.     Nose: Nose normal.     Mouth/Throat:     Pharynx: No oropharyngeal exudate or posterior oropharyngeal erythema.  Eyes:     Pupils: Pupils are equal, round, and reactive to light.  Neck:     Vascular: No carotid bruit.  Cardiovascular:      Rate and Rhythm: Normal rate and regular rhythm.     Pulses: Normal pulses.     Heart sounds: Normal heart sounds.  Pulmonary:     Effort: Pulmonary effort is normal.     Breath sounds: Normal breath sounds.  Abdominal:     General: Bowel sounds are normal.     Palpations: Abdomen is soft. There is no mass.     Tenderness: There is no abdominal tenderness.  Musculoskeletal:        General: Normal range of motion.     Cervical back: Neck supple.  Skin:    General: Skin is warm and dry.     Capillary Refill: Capillary refill takes less than 2 seconds.     Findings: No lesion.     Comments: Dry skin to bilateral upper and lower extremities  Neurological:     General: No focal deficit present.  Mental Status: He is alert and oriented to person, place, and time.  Psychiatric:        Mood and Affect: Mood normal.        Behavior: Behavior normal.     Lab Results  Component Value Date   WBC 7.0 01/17/2022   HGB 16.0 01/17/2022   HCT 45.4 01/17/2022   PLT 172 01/17/2022   GLUCOSE 149 (H) 01/17/2022   CHOL 137 02/28/2022   TRIG 147 02/28/2022   HDL 48 02/28/2022   LDLCALC 64 02/28/2022   ALT 37 01/17/2022   AST 32 01/17/2022   NA 138 01/17/2022   K 3.5 01/17/2022   CL 101 01/17/2022   CREATININE 1.13 01/17/2022   BUN 15 01/17/2022   CO2 20 01/17/2022   TSH 1.620 10/27/2021      Assessment & Plan:    1. Routine medical exam - CBC with Differential/Platelet - Comprehensive metabolic panel - T4, free - TSH  2. BMI 25.0-25.9,adult - CBC with Differential/Platelet - Comprehensive metabolic panel - T4, free - TSH  3. Encounter for screening for malignant neoplasm of prostate - PSA  4. Encounter for screening for malignant neoplasm of colon - Ambulatory referral to Gastroenterology  5. Need for vaccination - Varicella-zoster vaccine IM     We will call you with lab results and colonoscopy referral  Shingrix vaccine given in office today Tdap vaccine to  be given Friday, August 25th, 2023 Follow-up in 1 year   These are the goals we discussed:  Goals   Increase physical activity      This is a list of the screening recommended for you and due dates:  Health Maintenance  Topic Date Due   Tetanus Vaccine  Never done   Zoster (Shingles) Vaccine (1 of 2) Never done   Flu Shot  05/15/2022   Colon Cancer Screening  09/22/2022   COVID-19 Vaccine  Completed   HPV Vaccine  Aged Out      AN INDIVIDUALIZED CARE PLAN: was established or reinforced today.   SELF MANAGEMENT: The patient and I together assessed ways to personally work towards obtaining the recommended goals  Support needs The patient and/or family needs were assessed and services were offered and not necessary at this time.    Follow-up: 1 year CPE;      [facility] [phone]

## 2022-06-05 ENCOUNTER — Other Ambulatory Visit (HOSPITAL_COMMUNITY): Payer: Self-pay

## 2022-06-05 LAB — CBC WITH DIFFERENTIAL/PLATELET
Basophils Absolute: 0 10*3/uL (ref 0.0–0.2)
Basos: 0 %
EOS (ABSOLUTE): 0.3 10*3/uL (ref 0.0–0.4)
Eos: 5 %
Hematocrit: 41.1 % (ref 37.5–51.0)
Hemoglobin: 13.6 g/dL (ref 13.0–17.7)
Immature Grans (Abs): 0 10*3/uL (ref 0.0–0.1)
Immature Granulocytes: 0 %
Lymphocytes Absolute: 1.3 10*3/uL (ref 0.7–3.1)
Lymphs: 21 %
MCH: 33.8 pg — ABNORMAL HIGH (ref 26.6–33.0)
MCHC: 33.1 g/dL (ref 31.5–35.7)
MCV: 102 fL — ABNORMAL HIGH (ref 79–97)
Monocytes Absolute: 0.5 10*3/uL (ref 0.1–0.9)
Monocytes: 7 %
Neutrophils Absolute: 4.2 10*3/uL (ref 1.4–7.0)
Neutrophils: 67 %
Platelets: 159 10*3/uL (ref 150–450)
RBC: 4.02 x10E6/uL — ABNORMAL LOW (ref 4.14–5.80)
RDW: 12.1 % (ref 11.6–15.4)
WBC: 6.2 10*3/uL (ref 3.4–10.8)

## 2022-06-05 LAB — COMPREHENSIVE METABOLIC PANEL
ALT: 16 IU/L (ref 0–44)
AST: 17 IU/L (ref 0–40)
Albumin/Globulin Ratio: 1.7 (ref 1.2–2.2)
Albumin: 4.3 g/dL (ref 3.8–4.9)
Alkaline Phosphatase: 48 IU/L (ref 44–121)
BUN/Creatinine Ratio: 10 (ref 10–24)
BUN: 12 mg/dL (ref 8–27)
Bilirubin Total: 0.5 mg/dL (ref 0.0–1.2)
CO2: 22 mmol/L (ref 20–29)
Calcium: 9.1 mg/dL (ref 8.6–10.2)
Chloride: 104 mmol/L (ref 96–106)
Creatinine, Ser: 1.2 mg/dL (ref 0.76–1.27)
Globulin, Total: 2.6 g/dL (ref 1.5–4.5)
Glucose: 85 mg/dL (ref 70–99)
Potassium: 4.5 mmol/L (ref 3.5–5.2)
Sodium: 140 mmol/L (ref 134–144)
Total Protein: 6.9 g/dL (ref 6.0–8.5)
eGFR: 69 mL/min/{1.73_m2} (ref >59)

## 2022-06-05 LAB — PSA: Prostate Specific Ag, Serum: 1 ng/mL (ref 0.0–4.0)

## 2022-06-05 LAB — TSH: TSH: 1.97 u[IU]/mL (ref 0.450–4.500)

## 2022-06-05 LAB — T4, FREE: Free T4: 1.08 ng/dL (ref 0.82–1.77)

## 2022-06-09 ENCOUNTER — Other Ambulatory Visit (HOSPITAL_COMMUNITY): Payer: Self-pay

## 2022-06-13 ENCOUNTER — Other Ambulatory Visit: Payer: Self-pay | Admitting: Nurse Practitioner

## 2022-06-13 DIAGNOSIS — R2689 Other abnormalities of gait and mobility: Secondary | ICD-10-CM

## 2022-06-13 DIAGNOSIS — R42 Dizziness and giddiness: Secondary | ICD-10-CM

## 2022-06-13 DIAGNOSIS — H9313 Tinnitus, bilateral: Secondary | ICD-10-CM

## 2022-06-14 ENCOUNTER — Ambulatory Visit (INDEPENDENT_AMBULATORY_CARE_PROVIDER_SITE_OTHER): Payer: No Typology Code available for payment source | Admitting: Nurse Practitioner

## 2022-06-14 ENCOUNTER — Other Ambulatory Visit: Payer: Self-pay | Admitting: Nurse Practitioner

## 2022-06-14 ENCOUNTER — Encounter: Payer: Self-pay | Admitting: Nurse Practitioner

## 2022-06-14 DIAGNOSIS — R42 Dizziness and giddiness: Secondary | ICD-10-CM

## 2022-06-14 DIAGNOSIS — Z79899 Other long term (current) drug therapy: Secondary | ICD-10-CM | POA: Diagnosis not present

## 2022-06-14 MED ORDER — MECLIZINE HCL 25 MG PO TABS: 25.0000 mg | ORAL_TABLET | Freq: Three times a day (TID) | ORAL | 1 refills | Status: DC | PRN

## 2022-06-18 ENCOUNTER — Encounter: Payer: Self-pay | Admitting: Nurse Practitioner

## 2022-06-18 NOTE — Progress Notes (Signed)
   Acute Office Visit  Subjective:     Patient ID: Jon Gonzales, male    DOB: 1961/04/30, 61 y.o.   MRN: [id number]  CC: Dizziness Tinnitus  HPI Patient is in today for intermittent dizziness, tinnitus, impaired balance, and upper extremity tremors. Onset was a few months ago. Treatment has included Meclizine PRN. Denies fever, chills, nausea, seizure activity, or changes to vision. Pt has an upcoming ENT and brain MRI appt. Pt is currently prescribed Lithium 300 mg BID  ROS See pertinent positives and negatives per HPI.      Objective:    There were no vitals taken for this visit.  No data found.   Physical Exam Vitals reviewed.  Constitutional:      Appearance: He is ill-appearing.  HENT:     Right Ear: Tympanic membrane normal.     Left Ear: Tympanic membrane normal.     Mouth/Throat:     Mouth: Mucous membranes are moist.  Eyes:     Pupils: Pupils are equal, round, and reactive to light.  Cardiovascular:     Rate and Rhythm: Normal rate and regular rhythm.     Pulses: Normal pulses.     Heart sounds: Normal heart sounds.  Pulmonary:     Effort: Pulmonary effort is normal.     Breath sounds: Normal breath sounds.  Abdominal:     General: Bowel sounds are normal.     Palpations: Abdomen is soft.  Skin:    General: Skin is warm.     Capillary Refill: Capillary refill takes less than 2 seconds.     Coloration: Skin is pale.  Neurological:     General: No focal deficit present.     Mental Status: He is alert and oriented to person, place, and time.     Motor: Weakness present.     Gait: Gait abnormal.     Deep Tendon Reflexes: Reflexes normal.     Comments: Fine tremors noted to bilateral upper extremities  Psychiatric:        Mood and Affect: Mood normal.        Behavior: Behavior normal.       Assessment & Plan:   1. Vertigo -rest and push fluids -change positions slowly -continue Meclizine as prescribed -keep appts for brain MRI and ENT  specialist -orthostatic VS  2. Long-term current use of lithium - Lithium level; Future   Seek emergency medical care for any severe or concerning symptoms Follow-up as needed  Follow-up: PRN  I, Janie Morning, NP, have reviewed all documentation for this visit. The documentation on 06/18/22 for the exam, diagnosis, procedures, and orders are all accurate and complete.   Signed, Janie Morning, NP

## 2022-06-20 ENCOUNTER — Other Ambulatory Visit (HOSPITAL_COMMUNITY): Payer: Self-pay

## 2022-06-20 ENCOUNTER — Other Ambulatory Visit: Payer: Self-pay

## 2022-06-20 DIAGNOSIS — T782XXA Anaphylactic shock, unspecified, initial encounter: Secondary | ICD-10-CM

## 2022-06-20 MED ORDER — EPINEPHRINE 0.3 MG/0.3ML IJ SOAJ
0.3000 mg | INTRAMUSCULAR | 3 refills | Status: DC | PRN
  Filled 2022-06-20: qty 2, 1d supply, fill #0

## 2022-06-21 ENCOUNTER — Other Ambulatory Visit: Payer: Self-pay | Admitting: Nurse Practitioner

## 2022-06-21 DIAGNOSIS — F332 Major depressive disorder, recurrent severe without psychotic features: Secondary | ICD-10-CM

## 2022-06-21 MED ORDER — BUPROPION HCL ER (XL) 150 MG PO TB24
150.0000 mg | ORAL_TABLET | Freq: Every day | ORAL | 0 refills | Status: DC
  Filled 2022-06-21: qty 30, 30d supply, fill #0

## 2022-06-22 ENCOUNTER — Other Ambulatory Visit: Payer: Self-pay | Admitting: Nurse Practitioner

## 2022-06-22 ENCOUNTER — Other Ambulatory Visit (HOSPITAL_COMMUNITY): Payer: Self-pay

## 2022-06-22 DIAGNOSIS — Z79899 Other long term (current) drug therapy: Secondary | ICD-10-CM

## 2022-06-22 LAB — LITHIUM LEVEL: Lithium Lvl: 0.9 mmol/L (ref 0.5–1.2)

## 2022-06-24 ENCOUNTER — Ambulatory Visit
Admission: RE | Admit: 2022-06-24 | Discharge: 2022-06-24 | Disposition: A | Discharge status: Home / Self Care | Payer: No Typology Code available for payment source | Source: Ambulatory Visit | Attending: Nurse Practitioner | Admitting: Nurse Practitioner

## 2022-06-24 DIAGNOSIS — R42 Dizziness and giddiness: Secondary | ICD-10-CM

## 2022-06-24 DIAGNOSIS — R2689 Other abnormalities of gait and mobility: Secondary | ICD-10-CM

## 2022-06-24 DIAGNOSIS — H9313 Tinnitus, bilateral: Secondary | ICD-10-CM

## 2022-06-24 MED ORDER — GADOBENATE DIMEGLUMINE 529 MG/ML IV SOLN
17.0000 mL | Freq: Once | INTRAVENOUS | Status: AC | PRN
  Administered 2022-06-24: 17 mL via INTRAVENOUS

## 2022-06-25 ENCOUNTER — Other Ambulatory Visit (HOSPITAL_COMMUNITY): Payer: Self-pay

## 2022-06-26 ENCOUNTER — Encounter: Payer: Self-pay | Admitting: Legal Medicine

## 2022-06-26 DIAGNOSIS — H9313 Tinnitus, bilateral: Secondary | ICD-10-CM | POA: Insufficient documentation

## 2022-06-26 HISTORY — DX: Tinnitus, bilateral: H93.13

## 2022-07-13 ENCOUNTER — Ambulatory Visit (INDEPENDENT_AMBULATORY_CARE_PROVIDER_SITE_OTHER): Payer: No Typology Code available for payment source | Admitting: Nurse Practitioner

## 2022-07-13 ENCOUNTER — Encounter: Payer: Self-pay | Admitting: Nurse Practitioner

## 2022-07-13 VITALS — BP 112/80 | HR 89 | Temp 98.2°F | Ht 70.0 in | Wt 176.0 lb

## 2022-07-13 DIAGNOSIS — M545 Low back pain, unspecified: Secondary | ICD-10-CM

## 2022-07-13 LAB — POCT URINALYSIS DIP (CLINITEK)
Blood, UA: NEGATIVE
Glucose, UA: NEGATIVE mg/dL
Ketones, POC UA: NEGATIVE mg/dL
Leukocytes, UA: NEGATIVE
Nitrite, UA: NEGATIVE
Spec Grav, UA: 1.015 (ref 1.010–1.025)
Urobilinogen, UA: 0.2 E.U./dL
pH, UA: 6 (ref 5.0–8.0)

## 2022-07-13 MED ORDER — PREDNISONE 20 MG PO TABS: ORAL_TABLET | ORAL | 0 refills | Status: DC

## 2022-07-13 MED ORDER — TRIAMCINOLONE ACETONIDE 40 MG/ML IJ SUSP
60.0000 mg | Freq: Once | INTRAMUSCULAR | Status: AC
  Administered 2022-07-13: 60 mg via INTRAMUSCULAR

## 2022-07-13 MED ORDER — KETOROLAC TROMETHAMINE 60 MG/2ML IM SOLN
60.0000 mg | Freq: Once | INTRAMUSCULAR | Status: AC
  Administered 2022-07-13: 60 mg via INTRAMUSCULAR

## 2022-07-13 MED ORDER — CYCLOBENZAPRINE HCL 10 MG PO TABS: 10.0000 mg | ORAL_TABLET | Freq: Three times a day (TID) | ORAL | 0 refills | Status: DC | PRN

## 2022-07-13 NOTE — Patient Instructions (Addendum)
Toradol and Kenalog 60 mg injection given in office Take Prednisone taper as prescribed Alternate heat and ice to low back Apply Voltaren gel topically to low back up to 4 times daily Take Flexeril 10 mg up to 3 times daily for lower back muscle spasms Obtain lower back x-ray at [facility] in [place] Perform back exercises twice daily We will call you with PT referral at [place] Follow-up as needed  Chronic Back Pain When back pain lasts longer than 3 months, it is called chronic back pain. Pain may get worse at certain times (flare-ups). There are things you can do at home to manage your pain. Follow these instructions at home: Pay attention to any changes in your symptoms. Take these actions to help with your pain: Managing pain and stiffness     If told, put ice on the painful area. Your doctor may tell you to use ice for 24-48 hours after the flare-up starts. To do this: Put ice in a plastic bag. Place a towel between your skin and the bag. Leave the ice on for 20 minutes, 2-3 times a day. If told, put heat on the painful area. Do this as often as told by your doctor. Use the heat source that your doctor recommends, such as a moist heat pack or a heating pad. Place a towel between your skin and the heat source. Leave the heat on for 20-30 minutes. Take off the heat if your skin turns bright red. This is especially important if you are unable to feel pain, heat, or cold. You may have a greater risk of getting burned. Soak in a warm bath. This can help relieve pain. Activity  Avoid bending and other activities that make pain worse. When standing: Keep your upper back and neck straight. Keep your shoulders pulled back. Avoid slouching. When sitting: Keep your back straight. Relax your shoulders. Do not round your shoulders or pull them backward. Do not sit or stand in one place for long periods of time. Take short rest breaks during the day. Lying down or  standing is usually better than sitting. Resting can help relieve pain. When sitting or lying down for a long time, do some mild activity or stretching. This will help to prevent stiffness and pain. Get regular exercise. Ask your doctor what activities are safe for you. Do not lift anything that is heavier than 10 lb (4.5 kg) or the limit that you are told, until your doctor says that it is safe. To prevent injury when you lift things: Bend your knees. Keep the weight close to your body. Avoid twisting. Sleep on a firm mattress. Try lying on your side with your knees slightly bent. If you lie on your back, put a pillow under your knees. Medicines Treatment may include medicines for pain and swelling taken by mouth or put on the skin, prescription pain medicine, or muscle relaxants. Take over-the-counter and prescription medicines only as told by your doctor. Ask your doctor if the medicine prescribed to you: Requires you to avoid driving or using machinery. Can cause trouble pooping (constipation). You may need to take these actions to prevent or treat trouble pooping: Drink enough fluid to keep your pee (urine) pale yellow. Take over-the-counter or prescription medicines. Eat foods that are high in fiber. These include beans, whole grains, and fresh fruits and vegetables. Limit foods that are high in fat and sugars. These include fried or sweet foods. General instructions Do not use any products that  contain nicotine or tobacco, such as cigarettes, e-cigarettes, and chewing tobacco. If you need help quitting, ask your doctor. Keep all follow-up visits as told by your doctor. This is important. Contact a doctor if: Your pain does not get better with rest or medicine. Your pain gets worse, or you have new pain. You have a high fever. You lose weight very quickly. You have trouble doing your normal activities. Get help right away if: One or both of your legs or feet feel weak. One or both  of your legs or feet lose feeling (have numbness). You have trouble controlling when you poop (have a bowel movement) or pee (urinate). You have bad back pain and: You feel like you may vomit (nauseous), or you vomit. You have pain in your belly (abdomen). You have shortness of breath. You faint. Summary When back pain lasts longer than 3 months, it is called chronic back pain. Pain may get worse at certain times (flare-ups). Use ice and heat as told by your doctor. Your doctor may tell you to use ice after flare-ups. This information is not intended to replace advice given to you by your health care provider. Make sure you discuss any questions you have with your health care provider. Document Revised: 11/11/2019 Document Reviewed: 11/11/2019 Elsevier Patient Education  2023 [employer]  Low Back Sprain or Strain Rehab Ask your health care provider which exercises are safe for you. Do exercises exactly as told by your health care provider and adjust them as directed. It is normal to feel mild stretching, pulling, tightness, or discomfort as you do these exercises. Stop right away if you feel sudden pain or your pain gets worse. Do not begin these exercises until told by your health care provider. Stretching and range-of-motion exercises These exercises warm up your muscles and joints and improve the movement and flexibility of your back. These exercises also help to relieve pain, numbness, and tingling. Lumbar rotation  Lie on your back on a firm bed or the floor with your knees bent. Straighten your arms out to your sides so each arm forms a 90-degree angle (right angle) with a side of your body. Slowly move (rotate) both of your knees to one side of your body until you feel a stretch in your lower back (lumbar). Try not to let your shoulders lift off the floor. Hold this position for __________ seconds. Tense your abdominal muscles and slowly move your knees back to the starting  position. Repeat this exercise on the other side of your body. Repeat __________ times. Complete this exercise __________ times a day. Single knee to chest  Lie on your back on a firm bed or the floor with both legs straight. Bend one of your knees. Use your hands to move your knee up toward your chest until you feel a gentle stretch in your lower back and buttock. Hold your leg in this position by holding on to the front of your knee. Keep your other leg as straight as possible. Hold this position for __________ seconds. Slowly return to the starting position. Repeat with your other leg. Repeat __________ times. Complete this exercise __________ times a day. Prone extension on elbows  Lie on your abdomen on a firm bed or the floor (prone position). Prop yourself up on your elbows. Use your arms to help lift your chest up until you feel a gentle stretch in your abdomen and your lower back. This will place some of your body weight on your  elbows. If this is uncomfortable, try stacking pillows under your chest. Your hips should stay down, against the surface that you are lying on. Keep your hip and back muscles relaxed. Hold this position for __________ seconds. Slowly relax your upper body and return to the starting position. Repeat __________ times. Complete this exercise __________ times a day. Strengthening exercises These exercises build strength and endurance in your back. Endurance is the ability to use your muscles for a long time, even after they get tired. Pelvic tilt This exercise strengthens the muscles that lie deep in the abdomen. Lie on your back on a firm bed or the floor with your legs extended. Bend your knees so they are pointing toward the ceiling and your feet are flat on the floor. Tighten your lower abdominal muscles to press your lower back against the floor. This motion will tilt your pelvis so your tailbone points up toward the ceiling instead of pointing to your  feet or the floor. To help with this exercise, you may place a small towel under your lower back and try to push your back into the towel. Hold this position for __________ seconds. Let your muscles relax completely before you repeat this exercise. Repeat __________ times. Complete this exercise __________ times a day. Alternating arm and leg raises  Get on your hands and knees on a firm surface. If you are on a hard floor, you may want to use padding, such as an exercise mat, to cushion your knees. Line up your arms and legs. Your hands should be directly below your shoulders, and your knees should be directly below your hips. Lift your left leg behind you. At the same time, raise your right arm and straighten it in front of you. Do not lift your leg higher than your hip. Do not lift your arm higher than your shoulder. Keep your abdominal and back muscles tight. Keep your hips facing the ground. Do not arch your back. Keep your balance carefully, and do not hold your breath. Hold this position for __________ seconds. Slowly return to the starting position. Repeat with your right leg and your left arm. Repeat __________ times. Complete this exercise __________ times a day. Abdominal set with straight leg raise  Lie on your back on a firm bed or the floor. Bend one of your knees and keep your other leg straight. Tense your abdominal muscles and lift your straight leg up, 4-6 inches (10-15 cm) off the ground. Keep your abdominal muscles tight and hold this position for __________ seconds. Do not hold your breath. Do not arch your back. Keep it flat against the ground. Keep your abdominal muscles tense as you slowly lower your leg back to the starting position. Repeat with your other leg. Repeat __________ times. Complete this exercise __________ times a day. Single leg lower with bent knees Lie on your back on a firm bed or the floor. Tense your abdominal muscles and lift your feet off  the floor, one foot at a time, so your knees and hips are bent in 90-degree angles (right angles). Your knees should be over your hips and your lower legs should be parallel to the floor. Keeping your abdominal muscles tense and your knee bent, slowly lower one of your legs so your toe touches the ground. Lift your leg back up to return to the starting position. Do not hold your breath. Do not let your back arch. Keep your back flat against the ground. Repeat with your other  leg. Repeat __________ times. Complete this exercise __________ times a day. Posture and body mechanics Good posture and healthy body mechanics can help to relieve stress in your body's tissues and joints. Body mechanics refers to the movements and positions of your body while you do your daily activities. Posture is part of body mechanics. Good posture means: Your spine is in its natural S-curve position (neutral). Your shoulders are pulled back slightly. Your head is not tipped forward (neutral). Follow these guidelines to improve your posture and body mechanics in your everyday activities. Standing  When standing, keep your spine neutral and your feet about hip-width apart. Keep a slight bend in your knees. Your ears, shoulders, and hips should line up. When you do a task in which you stand in one place for a long time, place one foot up on a stable object that is 2-4 inches (5-10 cm) high, such as a footstool. This helps keep your spine neutral. Sitting  When sitting, keep your spine neutral and keep your feet flat on the floor. Use a footrest, if necessary, and keep your thighs parallel to the floor. Avoid rounding your shoulders, and avoid tilting your head forward. When working at a desk or a computer, keep your desk at a height where your hands are slightly lower than your elbows. Slide your chair under your desk so you are close enough to maintain good posture. When working at a computer, place your monitor at a  height where you are looking straight ahead and you do not have to tilt your head forward or downward to look at the screen. Resting When lying down and resting, avoid positions that are most painful for you. If you have pain with activities such as sitting, bending, stooping, or squatting, lie in a position in which your body does not bend very much. For example, avoid curling up on your side with your arms and knees near your chest (fetal position). If you have pain with activities such as standing for a long time or reaching with your arms, lie with your spine in a neutral position and bend your knees slightly. Try the following positions: Lying on your side with a pillow between your knees. Lying on your back with a pillow under your knees. Lifting  When lifting objects, keep your feet at least shoulder-width apart and tighten your abdominal muscles. Bend your knees and hips and keep your spine neutral. It is important to lift using the strength of your legs, not your back. Do not lock your knees straight out. Always ask for help to lift heavy or awkward objects. This information is not intended to replace advice given to you by your health care provider. Make sure you discuss any questions you have with your health care provider. Document Revised: 12/19/2020 Document Reviewed: 12/19/2020 Elsevier Patient Education  2023 [employer].

## 2022-07-13 NOTE — Progress Notes (Unsigned)
   Acute Office Visit  Subjective:     Patient ID: Jon Gonzales, male    DOB: 20-Aug-1961, 61 y.o.   MRN: [id number]  No chief complaint on file.   HPI Patient is in today for ***  Back Pain  He reports new onset back pain. There was not an injury that may have caused the pain. The most recent episode started a few months ago and is gradually worsening. The pain is located in the right lumbar area or left lumbar areato the right thigh. It is described as burning, sharp, soreness, stabbing, and stiffness, is 8/10 in intensity, occurring constantly. Symptoms are worse in the: "all the time"  Aggravating factors: bending forwards and walking uphill Relieving factors: medication ***.  He has tried acetaminophen, NSAIDs, and message therapy with no relief.   Associated symptoms: No abdominal pain No bowel incontinence  No chest pain No dysuria "  No fever No headaches  Yes joint pains-bilateral hips No pelvic pain  No weakness in leg  No tingling in lower extremities  No urinary incontinence No weight loss     ROS See pertinent positives and negatives per HPI.      Objective:    BP 112/80   Pulse 89   Temp 98.2 F (36.8 C)   Ht 5\' 10"  (1.778 m)   Wt 176 lb (79.8 kg)   SpO2 96%   BMI 25.25 kg/m  {Vitals History (Optional):23777}  Physical Exam        Assessment & Plan:   Problem List Items Addressed This Visit   None   No orders of the defined types were placed in this encounter.   No follow-ups on file.  Rip Harbour, NP

## 2022-07-16 ENCOUNTER — Other Ambulatory Visit: Payer: Self-pay | Admitting: Nurse Practitioner

## 2022-07-16 DIAGNOSIS — Z6827 Body mass index (BMI) 27.0-27.9, adult: Secondary | ICD-10-CM

## 2022-07-16 DIAGNOSIS — M545 Low back pain, unspecified: Secondary | ICD-10-CM

## 2022-07-17 ENCOUNTER — Other Ambulatory Visit: Payer: Self-pay | Admitting: Nurse Practitioner

## 2022-07-17 ENCOUNTER — Ambulatory Visit (INDEPENDENT_AMBULATORY_CARE_PROVIDER_SITE_OTHER): Payer: No Typology Code available for payment source

## 2022-07-17 ENCOUNTER — Other Ambulatory Visit (HOSPITAL_COMMUNITY): Payer: Self-pay

## 2022-07-17 DIAGNOSIS — Z23 Encounter for immunization: Secondary | ICD-10-CM

## 2022-07-17 MED ORDER — CYCLOBENZAPRINE HCL 10 MG PO TABS
10.0000 mg | ORAL_TABLET | Freq: Three times a day (TID) | ORAL | 0 refills | Status: DC | PRN
  Filled 2022-07-17: qty 30, 10d supply, fill #0

## 2022-07-17 MED ORDER — WEGOVY 2.4 MG/0.75ML ~~LOC~~ SOAJ
2.4000 mg | SUBCUTANEOUS | 2 refills | Status: DC
  Filled 2022-07-17: qty 3, 28d supply, fill #0
  Filled 2022-08-11: qty 3, 28d supply, fill #1
  Filled 2022-09-07 (×2): qty 3, 28d supply, fill #2

## 2022-07-17 MED ORDER — PRIMIDONE 50 MG PO TABS: 25.0000 mg | ORAL_TABLET | Freq: Every day | ORAL | 1 refills | Status: DC

## 2022-07-18 ENCOUNTER — Other Ambulatory Visit (HOSPITAL_COMMUNITY): Payer: Self-pay

## 2022-07-19 ENCOUNTER — Encounter: Payer: Self-pay | Admitting: Neurology

## 2022-07-19 ENCOUNTER — Other Ambulatory Visit: Payer: Self-pay | Admitting: Nurse Practitioner

## 2022-07-19 DIAGNOSIS — R251 Tremor, unspecified: Secondary | ICD-10-CM

## 2022-07-21 ENCOUNTER — Other Ambulatory Visit: Payer: Self-pay | Admitting: Nurse Practitioner

## 2022-07-21 DIAGNOSIS — F332 Major depressive disorder, recurrent severe without psychotic features: Secondary | ICD-10-CM

## 2022-07-22 MED ORDER — BUPROPION HCL ER (XL) 150 MG PO TB24
150.0000 mg | ORAL_TABLET | Freq: Every day | ORAL | 0 refills | Status: DC
  Filled 2022-07-22: qty 30, 30d supply, fill #0

## 2022-07-23 ENCOUNTER — Other Ambulatory Visit (HOSPITAL_COMMUNITY): Payer: Self-pay

## 2022-07-23 ENCOUNTER — Ambulatory Visit (INDEPENDENT_AMBULATORY_CARE_PROVIDER_SITE_OTHER): Payer: No Typology Code available for payment source

## 2022-07-23 VITALS — BP 132/88 | HR 85

## 2022-07-23 DIAGNOSIS — R61 Generalized hyperhidrosis: Secondary | ICD-10-CM

## 2022-07-23 LAB — GLUCOSE, POCT (MANUAL RESULT ENTRY): POC Glucose: 100 mg/dl — AB (ref 70–99)

## 2022-07-23 NOTE — Progress Notes (Signed)
Patient complained of dizziness (room spinning), impaired gait, assisted to him to a chair, VS obtained, dizziness subsided was given a snack and a beverage. Patient ambulated out of nurses station steady and unassisted.

## 2022-07-24 ENCOUNTER — Other Ambulatory Visit (HOSPITAL_COMMUNITY): Payer: Self-pay

## 2022-07-25 ENCOUNTER — Other Ambulatory Visit (HOSPITAL_COMMUNITY): Payer: Self-pay

## 2022-07-27 ENCOUNTER — Ambulatory Visit (INDEPENDENT_AMBULATORY_CARE_PROVIDER_SITE_OTHER): Payer: No Typology Code available for payment source

## 2022-07-27 DIAGNOSIS — Z23 Encounter for immunization: Secondary | ICD-10-CM

## 2022-08-10 ENCOUNTER — Encounter: Payer: Self-pay | Admitting: Family Medicine

## 2022-08-11 ENCOUNTER — Other Ambulatory Visit (HOSPITAL_COMMUNITY): Payer: Self-pay

## 2022-08-11 ENCOUNTER — Other Ambulatory Visit: Payer: Self-pay | Admitting: Nurse Practitioner

## 2022-08-11 ENCOUNTER — Other Ambulatory Visit: Payer: Self-pay | Admitting: Legal Medicine

## 2022-08-11 DIAGNOSIS — F5101 Primary insomnia: Secondary | ICD-10-CM

## 2022-08-11 DIAGNOSIS — F411 Generalized anxiety disorder: Secondary | ICD-10-CM

## 2022-08-11 MED ORDER — ESZOPICLONE 3 MG PO TABS
3.0000 mg | ORAL_TABLET | Freq: Every evening | ORAL | 2 refills | Status: DC | PRN
  Filled 2022-08-11: qty 90, 90d supply, fill #0

## 2022-08-12 MED ORDER — LORAZEPAM 1 MG PO TABS
1.0000 mg | ORAL_TABLET | Freq: Three times a day (TID) | ORAL | 1 refills | Status: DC | PRN
  Filled 2022-08-12: qty 90, 30d supply, fill #0
  Filled 2022-08-27: qty 90, 30d supply, fill #1

## 2022-08-13 ENCOUNTER — Other Ambulatory Visit (HOSPITAL_COMMUNITY): Payer: Self-pay

## 2022-08-14 ENCOUNTER — Encounter: Payer: Self-pay | Admitting: Legal Medicine

## 2022-08-17 ENCOUNTER — Other Ambulatory Visit: Payer: Self-pay | Admitting: Nurse Practitioner

## 2022-08-17 ENCOUNTER — Other Ambulatory Visit (HOSPITAL_COMMUNITY): Payer: Self-pay

## 2022-08-17 DIAGNOSIS — F332 Major depressive disorder, recurrent severe without psychotic features: Secondary | ICD-10-CM

## 2022-08-17 MED ORDER — BUPROPION HCL ER (XL) 150 MG PO TB24
150.0000 mg | ORAL_TABLET | Freq: Every day | ORAL | 0 refills | Status: DC
  Filled 2022-08-17: qty 30, 30d supply, fill #0

## 2022-08-18 ENCOUNTER — Other Ambulatory Visit (HOSPITAL_COMMUNITY): Payer: Self-pay

## 2022-08-24 NOTE — Progress Notes (Unsigned)
Assessment/Plan:   1.  Tremor  -This is likely due to the lithium.  Studies are varied, but incidate that up to 2/3 of patients on lithium will have lithium-induced tremor, even if the patients are not toxic on the medication.  This is just a common side effect of patients on lithium.  I did not recommend that he stop or taper the lithium.  We also discussed that ET cannot be diagnosed when one is on lithium.  Generally, in my experience, tremor medications do not work in patients who are on lithium.    Subjective:   Jon Gonzales was seen in consultation in the movement disorder clinic at the request of Janie Morning, NP.  The evaluation is for tremor.  I saw the patient over 3 years ago for the same.  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  I did not recommend medication.  Patient is still on lithium.  He states today that ***.  Tremor is located in the bilateral upper extremities.  Both hands shake equally.  There is no family history of tremor.  Affected by caffeine:  No. Affected by alcohol:  No. Affected by stress:  Yes.   Affected by fatigue:  No. Spills soup if on spoon:  {yes no:314532} Spills glass of liquid if full:  No. Affects ADL's (tying shoes, brushing teeth, etc):  No.  Current/Previously tried tremor medications: ***  Current medications that may exacerbate tremor:  ***On lithium since October, 2018.  Has been on Latuda in the past, but been off for many years  Outside reports reviewed: {Outside review:15817}.  Patient had an MRI of the brain in October, 2020 that was unremarkable intracranially.  Allergies  Allergen Reactions   Trintellix [Vortioxetine] Other (See Comments)    Headaches.   Penicillins Rash    Did it involve swelling of the face/tongue/throat, SOB, or low BP? Unknown Did it involve sudden or severe rash/hives, skin peeling, or any reaction on the inside of your mouth or nose? Unknown Did  you need to seek medical attention at a hospital or doctor's office? Unknown When did it last happen? childhood reaction      If all above answers are "NO", may proceed with cephalosporin use.     No outpatient medications have been marked as taking for the 08/28/22 encounter (Appointment) with Kalesha Irving, Octaviano Batty, DO.      Objective:   VITALS:  There were no vitals filed for this visit. Gen:  Appears stated age and in NAD. HEENT:  Normocephalic, atraumatic. The mucous membranes are moist. The superficial temporal arteries are without ropiness or tenderness. Cardiovascular: Regular rate and rhythm. Lungs: Clear to auscultation bilaterally. Neck: There are no carotid bruits noted bilaterally.  NEUROLOGICAL:  Orientation:  The patient is alert and oriented x 3.   Cranial nerves: There is good facial symmetry. Extraocular muscles are intact and visual fields are full to confrontational testing. Speech is fluent and clear. Soft palate rises symmetrically and there is no tongue deviation. Hearing is intact to conversational tone. Tone: Tone is good throughout. Sensation: Sensation is intact to light touch touch throughout (facial, trunk, extremities). Vibration is intact at the bilateral big toe. There is no extinction with double simultaneous stimulation. There is no sensory dermatomal level identified. Coordination:  The patient has no dysdiadichokinesia or dysmetria. Motor: Strength is 5/5 in the bilateral upper and lower extremities.  Shoulder shrug is equal bilaterally.  There is no pronator drift.  There are no fasciculations noted. DTR's: Deep tendon reflexes are 2/4 at the bilateral biceps, triceps, brachioradialis, patella and achilles.  Plantar responses are downgoing bilaterally. Gait and Station: The patient is able to ambulate without difficulty. The patient is able to heel toe walk without any difficulty. The patient is able to ambulate in a tandem fashion. The patient is able to  stand in the Romberg position.   MOVEMENT EXAM: Tremor:  There is *** tremor in the UE, noted most significantly with action.  The patient is *** able to draw Archimedes spirals without significant difficulty.  There is *** tremor at rest.  The patient is *** able to pour water from one glass to another without spilling it.  I have reviewed and interpreted the following labs independently   Chemistry      Component Value Date/Time   NA 140 06/04/2022 0927   K 4.5 06/04/2022 0927   CL 104 06/04/2022 0927   CO2 22 06/04/2022 0927   BUN 12 06/04/2022 0927   CREATININE 1.20 06/04/2022 0927      Component Value Date/Time   CALCIUM 9.1 06/04/2022 0927   ALKPHOS 48 06/04/2022 0927   AST 17 06/04/2022 0927   ALT 16 06/04/2022 0927   BILITOT 0.5 06/04/2022 0927      Lab Results  Component Value Date   WBC 6.2 06/04/2022   HGB 13.6 06/04/2022   HCT 41.1 06/04/2022   MCV 102 (H) 06/04/2022   PLT 159 06/04/2022   Lab Results  Component Value Date   TSH 1.970 06/04/2022   Lab Results  Component Value Date   LITHIUM 0.9 06/22/2022   NA 140 06/04/2022   BUN 12 06/04/2022   CREATININE 1.20 06/04/2022   TSH 1.970 06/04/2022   WBC 6.2 06/04/2022      Total time spent on today's visit was ***60 minutes, including both face-to-face time and nonface-to-face time.  Time included that spent on review of records (prior notes available to me/labs/imaging if pertinent), discussing treatment and goals, answering patient's questions and coordinating care.  CC:  [name], MD

## 2022-08-27 ENCOUNTER — Other Ambulatory Visit (HOSPITAL_COMMUNITY): Payer: Self-pay

## 2022-08-27 ENCOUNTER — Other Ambulatory Visit: Payer: Self-pay | Admitting: Cardiology

## 2022-08-27 ENCOUNTER — Other Ambulatory Visit: Payer: Self-pay | Admitting: Nurse Practitioner

## 2022-08-27 DIAGNOSIS — F332 Major depressive disorder, recurrent severe without psychotic features: Secondary | ICD-10-CM

## 2022-08-27 MED ORDER — BUPROPION HCL ER (XL) 150 MG PO TB24
150.0000 mg | ORAL_TABLET | Freq: Every day | ORAL | 0 refills | Status: DC
  Filled 2022-08-27 – 2022-09-18 (×2): qty 30, 30d supply, fill #0

## 2022-08-28 ENCOUNTER — Ambulatory Visit (INDEPENDENT_AMBULATORY_CARE_PROVIDER_SITE_OTHER): Payer: No Typology Code available for payment source | Admitting: Neurology

## 2022-08-28 ENCOUNTER — Encounter: Payer: Self-pay | Admitting: Neurology

## 2022-08-28 ENCOUNTER — Other Ambulatory Visit (HOSPITAL_COMMUNITY): Payer: Self-pay

## 2022-08-28 VITALS — BP 105/70 | HR 71 | Ht 70.0 in | Wt 178.0 lb

## 2022-08-28 DIAGNOSIS — Z789 Other specified health status: Secondary | ICD-10-CM

## 2022-08-28 DIAGNOSIS — R42 Dizziness and giddiness: Secondary | ICD-10-CM | POA: Diagnosis not present

## 2022-08-28 DIAGNOSIS — F419 Anxiety disorder, unspecified: Secondary | ICD-10-CM | POA: Diagnosis not present

## 2022-08-28 DIAGNOSIS — R251 Tremor, unspecified: Secondary | ICD-10-CM

## 2022-08-28 DIAGNOSIS — R413 Other amnesia: Secondary | ICD-10-CM

## 2022-08-28 MED ORDER — ROSUVASTATIN CALCIUM 40 MG PO TABS
40.0000 mg | ORAL_TABLET | Freq: Every day | ORAL | 2 refills | Status: DC
  Filled 2022-08-28: qty 90, 90d supply, fill #0

## 2022-08-28 NOTE — Patient Instructions (Addendum)
We discussed: Decreasing alcohol to no more than 2 days per week Watching wellbutring to make sure its not making anxiety/tremor worse Emailing me if spells get worse and we will do EEG.  You have been referred for a neurocognitive evaluation (i.e., evaluation of memory and thinking abilities). Please bring someone with you to this appointment if possible, as it is helpful for the neuropsychologist to hear from both you and another adult who knows you well. Please bring eyeglasses and hearing aids if you wear them and take any medications as you normally would.    The evaluation will take approximately 2-3 hours and has two parts:   The first part is a clinical interview with the neuropsychologist, Dr. Milbert Coulter.  During the interview, the neuropsychologist will speak with you and the individual you brought to the appointment.    The second part of the evaluation is testing with the doctor's technician, aka psychometrician, Annabelle Harman or [employer]. During the testing, the technician will ask you to remember different types of material, solve problems, and answer some questionnaires. Your family member will not be present for this portion of the evaluation.   Please note: We have to reserve several hours of the neuropsychologist's time and the psychometrician's time for your evaluation appointment. As such, there is a No-Show fee of $100. If you are unable to attend any of your appointments, please contact our office as soon as possible to reschedule.

## 2022-08-29 ENCOUNTER — Other Ambulatory Visit (HOSPITAL_COMMUNITY): Payer: Self-pay

## 2022-09-03 ENCOUNTER — Other Ambulatory Visit (HOSPITAL_COMMUNITY): Payer: Self-pay

## 2022-09-07 ENCOUNTER — Other Ambulatory Visit (HOSPITAL_COMMUNITY): Payer: Self-pay

## 2022-09-12 ENCOUNTER — Other Ambulatory Visit (HOSPITAL_COMMUNITY): Payer: Self-pay

## 2022-09-12 ENCOUNTER — Other Ambulatory Visit: Payer: Self-pay | Admitting: Nurse Practitioner

## 2022-09-12 DIAGNOSIS — M545 Low back pain, unspecified: Secondary | ICD-10-CM

## 2022-09-12 MED ORDER — CYCLOBENZAPRINE HCL 10 MG PO TABS
10.0000 mg | ORAL_TABLET | Freq: Three times a day (TID) | ORAL | 2 refills | Status: DC | PRN
  Filled 2022-09-12: qty 30, 10d supply, fill #0

## 2022-09-13 ENCOUNTER — Ambulatory Visit: Payer: No Typology Code available for payment source | Admitting: Gastroenterology

## 2022-09-13 ENCOUNTER — Other Ambulatory Visit (HOSPITAL_COMMUNITY): Payer: Self-pay

## 2022-09-13 ENCOUNTER — Other Ambulatory Visit: Payer: Self-pay | Admitting: Legal Medicine

## 2022-09-13 MED ORDER — HYDROCHLOROTHIAZIDE 12.5 MG PO TABS
12.5000 mg | ORAL_TABLET | Freq: Every day | ORAL | 1 refills | Status: DC
  Filled 2022-09-13: qty 90, 90d supply, fill #0

## 2022-09-17 NOTE — Progress Notes (Unsigned)
Subjective:  Patient ID: Jon Gonzales, male    DOB: 1961-01-24  Age: 61 y.o. MRN: [id number]  Chief Complaint  Patient presents with   Hyperlipidemia   Hypertension    HPI Patient presents with hyperlipidemia.  Compliance with treatment has been good; patient takes medicines as directed, maintains low cholesterol diet, follows up as directed, and maintains exercise regimen.  Patient is using Rosuvastatin 40 mg daily, Zetia 10 mg daily without problems.    Patient presents for follow up of hypertension.  Patient tolerating Hctz 12.5 mg daily, metoprolol 50 mg daily well without side effects.  Patient was diagnosed with hypertension and has been treated for hypertension for 15 years.Patient is working on maintaining diet and exercise regimen and follows up as directed. Complication include CAD.  GERD: Taking Pantoprazole 40 mg daily.  Current Outpatient Medications on File Prior to Visit  Medication Sig Dispense Refill   aspirin EC 81 MG tablet Take 1 tablet (81 mg total) by mouth daily. 30 tablet 0   buPROPion (WELLBUTRIN XL) 150 MG 24 hr tablet Take 1 tablet (150 mg total) by mouth daily. 30 tablet 0   clopidogrel (PLAVIX) 75 MG tablet TAKE 1 TABLET BY MOUTH ONCE DAILY (Patient taking differently: Take 75 mg by mouth daily.) 90 tablet 2   cyclobenzaprine (FLEXERIL) 10 MG tablet Take 1 tablet (10 mg total) by mouth 3 (three) times daily as needed for muscle spasms. 30 tablet 2   DULoxetine (CYMBALTA) 60 MG capsule TAKE 1 CAPSULE BY MOUTH 2 TIMES DAILY (Patient taking differently: Take 60 mg by mouth 2 (two) times daily.) 180 capsule 2   EPINEPHrine (EPIPEN 2-PAK) 0.3 mg/0.3 mL IJ SOAJ injection Inject 0.3 mg into the muscle as needed for anaphylaxis. 1 each 3   Eszopiclone 3 MG TABS Take 1 tablet (3 mg total) by mouth immediately before bedtime as needed for sleep. 90 tablet 2   ezetimibe (ZETIA) 10 MG tablet Take 1 tablet (10 mg total) by mouth daily. 90 tablet 3   fexofenadine (ALLEGRA)  60 MG tablet Take 60 mg by mouth daily.     fluticasone (FLONASE) 50 MCG/ACT nasal spray Place 2 sprays into both nostrils daily. 16 g 6   hydrochlorothiazide (HYDRODIURIL) 12.5 MG tablet Take 1 tablet (12.5 mg total) by mouth daily. 90 tablet 1   isosorbide mononitrate (IMDUR) 60 MG 24 hr tablet Take 1 tablet (60 mg total) by mouth daily. 90 tablet 3   lidocaine (XYLOCAINE) 2 % solution Use as directed 15 mLs in the mouth or throat every 6 (six) hours as needed for mouth pain. 100 mL 0   lithium carbonate (LITHOBID) 300 MG CR tablet TAKE 1 TABLET BY MOUTH TWICE DAILY (Patient taking differently: Take 300 mg by mouth 2 (two) times daily.) 180 tablet 2   LORazepam (ATIVAN) 1 MG tablet Take 1 tablet (1 mg total) by mouth every 8 (eight) hours as needed for anxiety. 90 tablet 1   meclizine (ANTIVERT) 25 MG tablet Take 1 tablet (25 mg total) by mouth 3 (three) times daily as needed for dizziness. 90 tablet 1   metoprolol succinate (TOPROL-XL) 50 MG 24 hr tablet Take 1 tablet by mouth daily. Take with or immediately following a meal. 90 tablet 3   Multiple Vitamin (MULTIVITAMIN WITH MINERALS) TABS tablet Take 1 tablet by mouth daily. Men's Multivitamin     neomycin-polymyxin-hydrocortisone (CORTISPORIN) OTIC solution Place 3 drops into the left ear 4 (four) times daily. 10 mL 0  nitroGLYCERIN (NITROSTAT) 0.4 MG SL tablet Dissolve 1 tablet (0.4 mg total) under the tongue every 5 (five) minutes up to 3 doses as needed for chest pain. If no relief after 3 doses call 911. 25 tablet 1   pantoprazole (PROTONIX) 40 MG tablet Take 1 tablet by mouth daily. 90 tablet 3   primidone (MYSOLINE) 50 MG tablet Take 0.5 tablets (25 mg total) by mouth at bedtime. 30 tablet 1   ranolazine (RANEXA) 1000 MG SR tablet Take 1 tablet by mouth 2 times daily. 180 tablet 3   rosuvastatin (CRESTOR) 40 MG tablet Take 1 tablet (40 mg total) by mouth daily. 90 tablet 2   Semaglutide-Weight Management (WEGOVY) 2.4 MG/0.75ML SOAJ Inject  2.4 mg into the skin once a week. 3 mL 2   [DISCONTINUED] diltiazem (CARDIZEM SR) 60 MG 12 hr capsule Take 1 capsule (60 mg total) by mouth 2 (two) times daily. 180 capsule 2   [DISCONTINUED] pantoprazole (PROTONIX) 40 MG tablet Take 1 tablet (40 mg total) by mouth daily. 30 tablet 3   [DISCONTINUED] triamterene-hydrochlorothiazide (DYAZIDE) 37.5-25 MG capsule Take 1 each (1 capsule total) by mouth daily. 90 capsule 2   No current facility-administered medications on file prior to visit.   Past Medical History:  Diagnosis Date   Abscess of buttock 11/23/2020   Allergy    Angina pectoris (HCC) 10/26/2019   Anxiety, generalized 05/22/2016   Bilateral leg edema 01/29/2020   CAD (coronary artery disease)    Cath 2013 LAD 95% stenosis, D1 90% stenosis, RCA 20%, Circ 30%.  Previous stent to D1 2012.   EF 60%.     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   Dehydration 11/07/2020   Depression    Dizziness 10/27/2020   Excessive daytime sleepiness 09/11/2016   GERD (gastroesophageal reflux disease)    Herpes zoster 01/20/2020   History of small bowel obstruction    Admitted 10-14-2007 for partical small bowel obstruction and N.G. tube was placed. Admitted by Dr Dimas ChyleGimenez. 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 Small internal hemorrhoids. Otherwise normal colonoscopy to terminal ileum   CORONARY ARTERY BYPASS GRAFT  02/19/2012   L - LAD, SVG - D1, SVG - OM   CORONARY STENT INTERVENTION N/A 11/05/2019   Procedure: CORONARY STENT INTERVENTION;  Surgeon: Runell GessBerry, Jonathan J, MD;  Location: [facility];  Service: Cardiovascular;  Laterality: N/A;  SVG to DIAG   ESOPHAGOGASTRODUODENOSCOPY  01/08/2007   Multiple duodenal ulcers with stenosis/stricture of the distal first portion of the duodenum (measuring approximantely 9mm) Mild gastritis.   LEFT HEART CATH AND  CORS/GRAFTS ANGIOGRAPHY N/A 11/05/2019   Procedure: LEFT HEART CATH AND CORS/GRAFTS ANGIOGRAPHY;  Surgeon: Runell Gess, MD;  Location: [facility];  Service: Cardiovascular;  Laterality: N/A;   RENAL ANGIOGRAPHY N/A 11/05/2019   Procedure: RENAL ANGIOGRAPHY;  Surgeon: Runell Gess, MD;  Location: [facility];  Service: Cardiovascular;  Laterality: N/A;   TONSILLECTOMY AND ADENOIDECTOMY     WRIST SURGERY      Family History  Problem Relation Age of Onset   CAD Father 14   Aneurysm Mother 63       cerebral   Heart disease Sister        Unclear   Healthy Brother    Social History   Socioeconomic History   Marital status: Married    Spouse name: Not on file   Number of children: Not on file   Years of education: Not on file   Highest education level: Bachelor's degree (e.g., BA, AB, BS)  Occupational History   Not on file  Tobacco Use   Smoking status: Never   Smokeless tobacco: Never  Vaping Use   Vaping Use: Never used  Substance and Sexual Activity   Alcohol use: Yes    Alcohol/week: 6.0 standard drinks of alcohol    Types: 6 Cans of beer per week    Comment: Couple of  beers   Drug use: Never   Sexual activity: Not on file  Other Topics Concern   Not on file  Social History Narrative   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        Right Handed   Lives in two story home    Social Determinants of Health   Financial Resource Strain: Low Risk  (06/04/2022)   Overall Financial Resource Strain (CARDIA)    Difficulty of Paying Living Expenses: Not hard at all  Food Insecurity: No Food Insecurity (06/04/2022)   Hunger Vital Sign    Worried About Running Out of Food in the Last Year: Never true    Ran Out of Food in the Last Year: Never true  Transportation Needs: No Transportation Needs (06/04/2022)   PRAPARE - Administrator, Civil Service (Medical): No    Lack of Transportation (Non-Medical): No  Physical Activity: Inactive (06/04/2022)   Exercise Vital Sign    Days of Exercise per Week: 0 days    Minutes of Exercise per Session: 0 min  Stress: No Stress Concern Present (06/04/2022)   [employer] of Occupational Health - Occupational Stress Questionnaire    Feeling of Stress : Not at all  Social Connections: Moderately Integrated (06/04/2022)   Social Connection and Isolation Panel [NHANES]    Frequency of Communication with Friends and Family: More than three times a week    Frequency of Social Gatherings with Friends and Family: More than three times a week    Attends Religious Services: Never    Database administrator or Organizations: Yes    Attends Engineer, structural: More than 4 times per year    Marital Status: Married    Review of Systems   Objective:  There were no vitals taken for this visit.     08/28/2022    1:29 PM 07/23/2022    4:06 PM 07/13/2022    8:54 AM  BP/Weight  Systolic BP 105 132 112  Diastolic BP 70 88 80  Wt. (Lbs) 178  176  BMI 25.54  kg/m2  25.25 kg/m2    Physical Exam  Diabetic Foot Exam - Simple   No data filed      Lab Results  Component  Value Date   WBC 6.2 06/04/2022   HGB 13.6 06/04/2022   HCT 41.1 06/04/2022   PLT 159 06/04/2022   GLUCOSE 85 06/04/2022   CHOL 137 02/28/2022   TRIG 147 02/28/2022   HDL 48 02/28/2022   LDLCALC 64 02/28/2022   ALT 16 06/04/2022   AST 17 06/04/2022   NA 140 06/04/2022   K 4.5 06/04/2022   CL 104 06/04/2022   CREATININE 1.20 06/04/2022   BUN 12 06/04/2022   CO2 22 06/04/2022   TSH 1.970 06/04/2022      Assessment & Plan:   Problem List Items Addressed This Visit       Cardiovascular and Mediastinum   Hypertension   Coronary artery disease involving coronary bypass graft of native heart with angina pectoris (HCC)     Respiratory   OSA on CPAP - Primary  .  No orders of the defined types were placed in this encounter.   No orders of the defined types were placed in this encounter.    Follow-up: No follow-ups on file.  An After Visit Summary was printed and given to the patient.  Brent Bulla, MD [facility] [phone]

## 2022-09-18 ENCOUNTER — Other Ambulatory Visit (HOSPITAL_COMMUNITY): Payer: Self-pay

## 2022-09-18 ENCOUNTER — Encounter: Payer: Self-pay | Admitting: Legal Medicine

## 2022-09-18 ENCOUNTER — Ambulatory Visit (INDEPENDENT_AMBULATORY_CARE_PROVIDER_SITE_OTHER): Payer: No Typology Code available for payment source | Admitting: Legal Medicine

## 2022-09-18 VITALS — BP 100/60 | HR 72 | Temp 97.1°F | Resp 16 | Ht 70.0 in | Wt 172.0 lb

## 2022-09-18 DIAGNOSIS — I10 Essential (primary) hypertension: Secondary | ICD-10-CM

## 2022-09-18 DIAGNOSIS — G4733 Obstructive sleep apnea (adult) (pediatric): Secondary | ICD-10-CM

## 2022-09-18 DIAGNOSIS — G5762 Lesion of plantar nerve, left lower limb: Secondary | ICD-10-CM

## 2022-09-18 DIAGNOSIS — I25709 Atherosclerosis of coronary artery bypass graft(s), unspecified, with unspecified angina pectoris: Secondary | ICD-10-CM

## 2022-09-18 DIAGNOSIS — E7800 Pure hypercholesterolemia, unspecified: Secondary | ICD-10-CM

## 2022-09-18 DIAGNOSIS — F3181 Bipolar II disorder: Secondary | ICD-10-CM

## 2022-09-18 HISTORY — DX: Lesion of plantar nerve, left lower limb: G57.62

## 2022-09-18 MED ORDER — TRIAMCINOLONE ACETONIDE 40 MG/ML IJ SUSP: 40.0000 mg | Freq: Once | INTRAMUSCULAR | Status: DC

## 2022-09-19 NOTE — Addendum Note (Signed)
Addended by: Brent Bulla on: 09/19/2022 05:37 PM   Modules accepted: Level of Service

## 2022-09-20 ENCOUNTER — Other Ambulatory Visit: Payer: Self-pay

## 2022-09-22 ENCOUNTER — Other Ambulatory Visit: Payer: Self-pay | Admitting: Legal Medicine

## 2022-09-22 DIAGNOSIS — M545 Low back pain, unspecified: Secondary | ICD-10-CM

## 2022-09-22 DIAGNOSIS — I25118 Atherosclerotic heart disease of native coronary artery with other forms of angina pectoris: Secondary | ICD-10-CM

## 2022-09-22 DIAGNOSIS — E663 Overweight: Secondary | ICD-10-CM

## 2022-09-22 DIAGNOSIS — T782XXA Anaphylactic shock, unspecified, initial encounter: Secondary | ICD-10-CM

## 2022-09-22 DIAGNOSIS — K219 Gastro-esophageal reflux disease without esophagitis: Secondary | ICD-10-CM

## 2022-09-22 DIAGNOSIS — I2510 Atherosclerotic heart disease of native coronary artery without angina pectoris: Secondary | ICD-10-CM

## 2022-09-22 DIAGNOSIS — F411 Generalized anxiety disorder: Secondary | ICD-10-CM

## 2022-09-22 DIAGNOSIS — F5101 Primary insomnia: Secondary | ICD-10-CM

## 2022-09-22 DIAGNOSIS — R42 Dizziness and giddiness: Secondary | ICD-10-CM

## 2022-09-22 DIAGNOSIS — F319 Bipolar disorder, unspecified: Secondary | ICD-10-CM

## 2022-09-22 DIAGNOSIS — F332 Major depressive disorder, recurrent severe without psychotic features: Secondary | ICD-10-CM

## 2022-09-22 DIAGNOSIS — J3089 Other allergic rhinitis: Secondary | ICD-10-CM

## 2022-09-22 DIAGNOSIS — Z6827 Body mass index (BMI) 27.0-27.9, adult: Secondary | ICD-10-CM

## 2022-09-22 MED ORDER — PRIMIDONE 50 MG PO TABS
25.0000 mg | ORAL_TABLET | Freq: Every day | ORAL | 1 refills | Status: DC
  Filled 2022-09-22: qty 30, 60d supply, fill #0
  Filled 2022-11-17: qty 30, 60d supply, fill #1

## 2022-09-22 MED ORDER — EZETIMIBE 10 MG PO TABS
10.0000 mg | ORAL_TABLET | Freq: Every day | ORAL | 3 refills | Status: DC
  Filled 2022-09-22: qty 90, 90d supply, fill #0

## 2022-09-22 MED ORDER — PANTOPRAZOLE SODIUM 40 MG PO TBEC
40.0000 mg | DELAYED_RELEASE_TABLET | Freq: Every day | ORAL | 3 refills | Status: DC
  Filled 2022-09-22: qty 90, 90d supply, fill #0

## 2022-09-22 MED ORDER — DULOXETINE HCL 60 MG PO CPEP
ORAL_CAPSULE | Freq: Two times a day (BID) | ORAL | 2 refills | Status: DC
  Filled 2022-09-22: qty 180, fill #0

## 2022-09-22 MED ORDER — AMLODIPINE BESYLATE 10 MG PO TABS
10.0000 mg | ORAL_TABLET | Freq: Every day | ORAL | 2 refills | Status: DC
  Filled 2022-09-22: qty 90, 90d supply, fill #0

## 2022-09-22 MED ORDER — FEXOFENADINE HCL 60 MG PO TABS
60.0000 mg | ORAL_TABLET | Freq: Every day | ORAL | 2 refills | Status: DC
  Filled 2022-09-22: qty 90, 90d supply, fill #0

## 2022-09-22 MED ORDER — WEGOVY 2.4 MG/0.75ML ~~LOC~~ SOAJ
2.4000 mg | SUBCUTANEOUS | 2 refills | Status: DC
  Filled 2022-09-22: qty 3, 28d supply, fill #0

## 2022-09-22 MED ORDER — MECLIZINE HCL 25 MG PO TABS
25.0000 mg | ORAL_TABLET | Freq: Three times a day (TID) | ORAL | 1 refills | Status: DC | PRN
  Filled 2022-09-22: qty 90, 30d supply, fill #0

## 2022-09-22 MED ORDER — LITHIUM CARBONATE ER 300 MG PO TBCR
EXTENDED_RELEASE_TABLET | Freq: Two times a day (BID) | ORAL | 2 refills | Status: DC
  Filled 2022-09-22: qty 180, fill #0

## 2022-09-22 MED ORDER — RANOLAZINE ER 1000 MG PO TB12
1000.0000 mg | ORAL_TABLET | Freq: Two times a day (BID) | ORAL | 3 refills | Status: DC
  Filled 2022-09-22: qty 180, 90d supply, fill #0

## 2022-09-22 MED ORDER — FLUTICASONE PROPIONATE 50 MCG/ACT NA SUSP
2.0000 | Freq: Every day | NASAL | 6 refills | Status: DC
  Filled 2022-09-22: qty 16, 30d supply, fill #0

## 2022-09-22 MED ORDER — ESZOPICLONE 3 MG PO TABS
3.0000 mg | ORAL_TABLET | Freq: Every evening | ORAL | 2 refills | Status: DC | PRN
  Filled 2022-09-22: qty 90, 90d supply, fill #0

## 2022-09-22 MED ORDER — CYCLOBENZAPRINE HCL 10 MG PO TABS
10.0000 mg | ORAL_TABLET | Freq: Three times a day (TID) | ORAL | 2 refills | Status: DC | PRN
  Filled 2022-09-22: qty 30, 10d supply, fill #0

## 2022-09-22 MED ORDER — LORAZEPAM 1 MG PO TABS
1.0000 mg | ORAL_TABLET | Freq: Three times a day (TID) | ORAL | 1 refills | Status: DC | PRN
  Filled 2022-09-22: qty 90, 30d supply, fill #0

## 2022-09-22 MED ORDER — EPINEPHRINE 0.3 MG/0.3ML IJ SOAJ
0.3000 mg | INTRAMUSCULAR | 3 refills | Status: DC | PRN
  Filled 2022-09-22: qty 2, 30d supply, fill #0

## 2022-09-22 MED ORDER — METOPROLOL SUCCINATE ER 50 MG PO TB24
50.0000 mg | ORAL_TABLET | Freq: Every day | ORAL | 3 refills | Status: DC
  Filled 2022-09-22: qty 90, 90d supply, fill #0

## 2022-09-22 MED ORDER — BUPROPION HCL ER (XL) 150 MG PO TB24
150.0000 mg | ORAL_TABLET | Freq: Every day | ORAL | 0 refills | Status: DC
  Filled 2022-09-22: qty 30, 30d supply, fill #0

## 2022-09-22 MED ORDER — HYDROCHLOROTHIAZIDE 12.5 MG PO TABS
12.5000 mg | ORAL_TABLET | Freq: Every day | ORAL | 1 refills | Status: DC
  Filled 2022-09-22: qty 90, 90d supply, fill #0

## 2022-09-22 MED ORDER — ISOSORBIDE MONONITRATE ER 60 MG PO TB24
60.0000 mg | ORAL_TABLET | Freq: Every day | ORAL | 3 refills | Status: DC
  Filled 2022-09-22: qty 90, 90d supply, fill #0

## 2022-09-22 MED ORDER — NITROGLYCERIN 0.4 MG SL SUBL
0.4000 mg | SUBLINGUAL_TABLET | SUBLINGUAL | 1 refills | Status: DC | PRN
  Filled 2022-09-22: qty 25, 8d supply, fill #0

## 2022-09-22 MED ORDER — ROSUVASTATIN CALCIUM 40 MG PO TABS
40.0000 mg | ORAL_TABLET | Freq: Every day | ORAL | 2 refills | Status: DC
  Filled 2022-09-22: qty 90, 90d supply, fill #0

## 2022-09-22 MED ORDER — CLOPIDOGREL BISULFATE 75 MG PO TABS
ORAL_TABLET | Freq: Every day | ORAL | 2 refills | Status: DC
  Filled 2022-09-22: qty 90, fill #0

## 2022-09-23 ENCOUNTER — Other Ambulatory Visit (HOSPITAL_COMMUNITY): Payer: Self-pay

## 2022-09-24 ENCOUNTER — Ambulatory Visit: Payer: No Typology Code available for payment source | Attending: Cardiology | Admitting: Cardiology

## 2022-09-24 ENCOUNTER — Encounter: Payer: Self-pay | Admitting: Cardiology

## 2022-09-24 ENCOUNTER — Other Ambulatory Visit (HOSPITAL_COMMUNITY): Payer: Self-pay

## 2022-09-24 VITALS — BP 118/78 | HR 82 | Ht 70.0 in | Wt 172.0 lb

## 2022-09-24 DIAGNOSIS — I10 Essential (primary) hypertension: Secondary | ICD-10-CM

## 2022-09-24 DIAGNOSIS — E7800 Pure hypercholesterolemia, unspecified: Secondary | ICD-10-CM

## 2022-09-24 DIAGNOSIS — I25709 Atherosclerosis of coronary artery bypass graft(s), unspecified, with unspecified angina pectoris: Secondary | ICD-10-CM

## 2022-09-24 DIAGNOSIS — I209 Angina pectoris, unspecified: Secondary | ICD-10-CM | POA: Diagnosis not present

## 2022-09-24 NOTE — Addendum Note (Signed)
Addended by: Baldo Ash D on: 09/24/2022 08:26 AM   Modules accepted: Orders

## 2022-09-24 NOTE — Patient Instructions (Signed)
Medication Instructions:  Your physician recommends that you continue on your current medications as directed. Please refer to the Current Medication list given to you today.  *If you need a refill on your cardiac medications before your next appointment, please call your pharmacy*   Lab Work: Your physician recommends that you return for lab work in: when fasting You need to have labs done when you are fasting.  You can come Monday through Friday 8:30 am to 12:00 pm and 1:15 to 4:30. You do not need to make an appointment as the order has already been placed. The labs you are going to have done are Lipid, AST, ALT.    Testing/Procedures: None Ordered   Follow-Up: At [facility], you and your health needs are our priority.  As part of our continuing mission to provide you with exceptional heart care, we have created designated Provider Care Teams.  These Care Teams include your primary Cardiologist (physician) and Advanced Practice Providers (APPs -  Physician Assistants and Nurse Practitioners) who all work together to provide you with the care you need, when you need it.  We recommend signing up for the patient portal called "MyChart".  Sign up information is provided on this After Visit Summary.  MyChart is used to connect with patients for Virtual Visits (Telemedicine).  Patients are able to view lab/test results, encounter notes, upcoming appointments, etc.  Non-urgent messages can be sent to your provider as well.   To learn more about what you can do with MyChart, go to [email or web address].    Your next appointment:   6 month(s)  The format for your next appointment:   In Person  Provider:   Robert Krasowski, MD    Other Instructions NA  

## 2022-09-24 NOTE — Progress Notes (Signed)
Cardiology Office Note:    Date:  09/24/2022   ID:  Leverne Gonzales, DOB March 16, 1961, MRN [id number]  PCP:  Lillard Anes, MD  Cardiologist:  Jenne Campus, MD    Referring MD: Lillard Anes,*   Chief Complaint  Patient presents with   Follow-up    History of Present Illness:    Jon Gonzales is a 61 y.o. male    with past medical history significant for premature coronary artery disease.  In 2012 he got coronary bypass graft with 3 vessels LIMA to LAD SVG diagonal SVG to obtuse marginal.  In January 2021 he required drug-eluting stent to native diagonal branch.  He also had history of depression, anxiety, essential hypertension, dyslipidemia.  Also recently he started having some difficulty hearing to some suspicion for Mnire's disease. He presented to my office in May with chief complaint of chest pain.  Since that time he did have a stress test done stress to show ischemia only mild and small area involving inferior wall, prior cardiac catheterization from 2021 show RCA lesion however was only 50%, we gradually try to put him on medication he said since he seen him last time he have to use nitroglycerin only once In about 3 weeks ago he said the episode of chest pain happens every few weeks.  We had a long discussion about what to do with the situation.  I think we have a level that appropriate guideline directed medical therapy need to be maximized.  However because of his lack of exercises it is difficult to objectively assess the effectiveness of the treatment.  Therefore I suggested to start walking on the regular basis I told him at least half an hour 5 times a week that should be a minimum.  He is coming today Jon Gonzales for follow-up.  Overall doing very well.  He denies have any chest pain tightness squeezing pressure burning chest.  He tells me that he exercised on the regular basis but he admits he does not do enough.  Past Medical History:  Diagnosis Date    Abscess of buttock 11/23/2020   Allergy    Angina pectoris ([place]) 10/26/2019   Anxiety, generalized 05/22/2016   Bilateral leg edema 01/29/2020   CAD (coronary artery disease)    Cath 2013 LAD 95% stenosis, D1 90% stenosis, RCA 20%, Circ 30%.  Previous stent to D1 2012.   EF 60%.     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   Dehydration 11/07/2020   Depression    Dizziness 10/27/2020   Excessive daytime sleepiness 09/11/2016   GERD (gastroesophageal reflux disease)    Herpes zoster 01/20/2020   History of small bowel obstruction    Admitted 10-14-2007 for partical small bowel obstruction and N.G. tube was placed. Admitted by Dr Pauletta Browns. 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 Small internal hemorrhoids. Otherwise normal colonoscopy to terminal ileum   CORONARY ARTERY BYPASS GRAFT  02/19/2012   L - LAD, SVG - D1, SVG - OM   CORONARY STENT INTERVENTION N/A 11/05/2019   Procedure: CORONARY STENT INTERVENTION;  Surgeon: Lorretta Harp, MD;  Location: [facility];   Service: Cardiovascular;  Laterality: N/A;  SVG to DIAG   ESOPHAGOGASTRODUODENOSCOPY  01/08/2007   Multiple duodenal ulcers with stenosis/stricture of the distal first portion of the duodenum (measuring approximantely 8mm) Mild gastritis.   LEFT HEART CATH AND CORS/GRAFTS ANGIOGRAPHY N/A 11/05/2019   Procedure: LEFT HEART CATH AND CORS/GRAFTS ANGIOGRAPHY;  Surgeon: Lorretta Harp, MD;  Location: [facility];  Service: Cardiovascular;  Laterality: N/A;   RENAL ANGIOGRAPHY N/A 11/05/2019   Procedure: RENAL ANGIOGRAPHY;  Surgeon: Lorretta Harp, MD;  Location: [facility];  Service: Cardiovascular;  Laterality: N/A;   TONSILLECTOMY AND ADENOIDECTOMY     WRIST SURGERY      Current Medications: Current Meds  Medication Sig   amLODipine (NORVASC) 10 MG tablet Take 1 tablet (10 mg total) by mouth daily.   aspirin EC 81 MG tablet Take 1 tablet (81 mg total) by mouth daily.   buPROPion (WELLBUTRIN XL) 150 MG 24 hr tablet Take 1 tablet (150 mg total) by mouth daily.   clopidogrel (PLAVIX) 75 MG tablet TAKE 1 TABLET BY MOUTH ONCE DAILY (Patient taking differently: Take 75 mg by mouth daily.)   cyclobenzaprine (FLEXERIL) 10 MG tablet Take 1 tablet (10 mg total) by mouth 3 (three) times daily as needed for muscle spasms.   DULoxetine (CYMBALTA) 60 MG capsule TAKE 1 CAPSULE BY MOUTH 2 TIMES DAILY (Patient taking differently: Take 60 mg by mouth 2 (two) times daily.)   EPINEPHrine (EPIPEN 2-PAK) 0.3 mg/0.3 mL IJ SOAJ injection Inject 0.3 mg into the muscle as needed for anaphylaxis.   Eszopiclone 3 MG TABS Take 1 tablet (3 mg total) by mouth immediately before bedtime as needed for sleep.   ezetimibe (ZETIA) 10 MG tablet Take 1 tablet (10 mg total) by mouth daily.   fexofenadine (ALLEGRA) 60 MG tablet Take 1 tablet (60 mg total) by mouth daily.   fluticasone (FLONASE) 50 MCG/ACT nasal spray Place 2 sprays into both nostrils daily.   hydrochlorothiazide (HYDRODIURIL) 12.5 MG tablet Take  1 tablet (12.5 mg total) by mouth daily.   isosorbide mononitrate (IMDUR) 60 MG 24 hr tablet Take 1 tablet (60 mg total) by mouth daily.   lidocaine (XYLOCAINE) 2 % solution Use as directed 15 mLs in the mouth or throat every 6 (six) hours as needed for mouth pain.   lithium carbonate (LITHOBID) 300 MG ER tablet TAKE 1 TABLET BY MOUTH TWICE DAILY (Patient taking differently: Take 300 mg by mouth 2 (two) times daily.)   LORazepam (ATIVAN) 1 MG tablet Take 1 tablet (1 mg total) by mouth every 8 (eight) hours as needed for anxiety.   meclizine (ANTIVERT) 25 MG tablet Take 1 tablet (25 mg total) by mouth 3 (three) times daily as needed for dizziness.   metoprolol succinate (TOPROL-XL) 50 MG 24 hr tablet Take 1 tablet by mouth daily. Take with or immediately following a meal.   Multiple Vitamin (MULTIVITAMIN  WITH MINERALS) TABS tablet Take 1 tablet by mouth daily. Men's Multivitamin   neomycin-polymyxin-hydrocortisone (CORTISPORIN) OTIC solution Place 3 drops into the left ear 4 (four) times daily.   nitroGLYCERIN (NITROSTAT) 0.4 MG SL tablet Dissolve 1 tablet (0.4 mg total) under the tongue every 5 (five) minutes up to 3 doses as needed for chest pain. If no relief after 3 doses call 911.   pantoprazole (PROTONIX) 40 MG tablet Take 1 tablet by mouth daily.   primidone (MYSOLINE) 50 MG tablet Take 1/2 tablet (25 mg total) by mouth at bedtime.   ranolazine (RANEXA) 1000 MG SR tablet Take 1 tablet by mouth 2 times daily.   rosuvastatin (CRESTOR) 40 MG tablet Take 1 tablet (40 mg total) by mouth daily.   Semaglutide-Weight Management (WEGOVY) 2.4 MG/0.75ML SOAJ Inject 2.4 mg into the skin once a week.   Current Facility-Administered Medications for the 09/24/22 encounter (Office Visit) with Park Liter, MD  Medication   triamcinolone acetonide (KENALOG-40) injection 40 mg     Allergies:   Trintellix [vortioxetine] and Penicillins   Social History   Socioeconomic History   Marital status:  Married    Spouse name: Not on file   Number of children: Not on file   Years of education: Not on file   Highest education level: Bachelor's degree (e.g., BA, AB, BS)  Occupational History   Not on file  Tobacco Use   Smoking status: Never   Smokeless tobacco: Never  Vaping Use   Vaping Use: Never used  Substance and Sexual Activity   Alcohol use: Yes    Alcohol/week: 6.0 standard drinks of alcohol    Types: 6 Cans of beer per week    Comment: Couple of beers   Drug use: Never   Sexual activity: Not on file  Other Topics Concern   Not on file  Social History Narrative   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        Right Handed   Lives in two story home    Social Determinants of Health   Financial Resource Strain: Low Risk  (06/04/2022)   Overall Financial Resource Strain (CARDIA)    Difficulty of Paying Living Expenses: Not hard at all  Food Insecurity: No Food Insecurity (06/04/2022)   Hunger Vital Sign    Worried About Running Out of Food in the Last Year: Never true    Ran Out of Food in the Last Year: Never true  Transportation Needs: No Transportation Needs (06/04/2022)   PRAPARE - Hydrologist (Medical): No    Lack of Transportation (Non-Medical): No  Physical Activity: Inactive (06/04/2022)   Exercise Vital Sign    Days of Exercise per Week: 0 days    Minutes of Exercise per Session: 0 min  Stress: No Stress Concern Present (06/04/2022)   [place]    Feeling of Stress : Not at all  Social Connections: Moderately Integrated (06/04/2022)   Social Connection and Isolation Panel [NHANES]    Frequency of Communication with Friends and Family: More than three times a week    Frequency of Social Gatherings with Friends and Family: More than three times a week    Attends Religious Services: Never    Marine scientist or Organizations:  Yes    Attends Music therapist: More than 4 times per year    Marital  Status: Married     Family History: The patient's family history includes Aneurysm (age of onset: 72) in his mother; CAD (age of onset: 71) in his father; Healthy in his brother; Heart disease in his sister. ROS:   Please see the history of present illness.    All 14 point review of systems negative except as described per history of present illness  EKGs/Labs/Other Studies Reviewed:      Recent Labs: 06/04/2022: ALT 16; BUN 12; Creatinine, Ser 1.20; Hemoglobin 13.6; Platelets 159; Potassium 4.5; Sodium 140; TSH 1.970  Recent Lipid Panel    Component Value Date/Time   CHOL 137 02/28/2022 1613   TRIG 147 02/28/2022 1613   HDL 48 02/28/2022 1613   CHOLHDL 2.9 02/28/2022 1613   LDLCALC 64 02/28/2022 1613    Physical Exam:    VS:  BP 118/78 (BP Location: Left Arm, Patient Position: Sitting)   Pulse 82   Ht 5\' 10"  (1.778 m)   Wt 172 lb (78 kg)   SpO2 97%   BMI 24.68 kg/m     Wt Readings from Last 3 Encounters:  09/24/22 172 lb (78 kg)  09/18/22 172 lb (78 kg)  08/28/22 178 lb (80.7 kg)     GEN:  Well nourished, well developed in no acute distress HEENT: Normal NECK: No JVD; No carotid bruits LYMPHATICS: No lymphadenopathy CARDIAC: RRR, no murmurs, no rubs, no gallops RESPIRATORY:  Clear to auscultation without rales, wheezing or rhonchi  ABDOMEN: Soft, non-tender, non-distended MUSCULOSKELETAL:  No edema; No deformity  SKIN: Warm and dry LOWER EXTREMITIES: no swelling NEUROLOGIC:  Alert and oriented x 3 PSYCHIATRIC:  Normal affect   ASSESSMENT:    1. Coronary artery disease involving coronary bypass graft of native heart with angina pectoris (HCC)   2. Angina pectoris (HCC)   3. Primary hypertension   4. Hypercholesterolemia    PLAN:    In order of problems listed above:  Coronary artery disease stable from that point review asymptomatic on medical therapy which I will  continue that include dual antiplatelet therapy. Angina pectoris: Denies having any.  No need to take nitroglycerin Essential hypertension: Blood pressure well-controlled continue present management. Cholesterolemia I do have his data from Feb 28, 2022 which showing me LDL 64 HDL 48.  He is on Crestor 40 which I will continue.  I will ask him to have another fasting lipid profile done today.  Ideally I would like to see his LDL below 55.   Medication Adjustments/Labs and Tests Ordered: Current medicines are reviewed at length with the patient today.  Concerns regarding medicines are outlined above.  No orders of the defined types were placed in this encounter.  Medication changes: No orders of the defined types were placed in this encounter.   Signed, Mar 02, 2022, MD, [facility] 09/24/2022 8:17 AM    [facility]

## 2022-10-03 ENCOUNTER — Other Ambulatory Visit: Payer: Self-pay

## 2022-10-03 DIAGNOSIS — E663 Overweight: Secondary | ICD-10-CM

## 2022-10-03 DIAGNOSIS — K219 Gastro-esophageal reflux disease without esophagitis: Secondary | ICD-10-CM

## 2022-10-03 DIAGNOSIS — F411 Generalized anxiety disorder: Secondary | ICD-10-CM

## 2022-10-03 DIAGNOSIS — F332 Major depressive disorder, recurrent severe without psychotic features: Secondary | ICD-10-CM

## 2022-10-03 DIAGNOSIS — J3089 Other allergic rhinitis: Secondary | ICD-10-CM

## 2022-10-03 DIAGNOSIS — Z6827 Body mass index (BMI) 27.0-27.9, adult: Secondary | ICD-10-CM

## 2022-10-03 DIAGNOSIS — M545 Low back pain, unspecified: Secondary | ICD-10-CM

## 2022-10-03 DIAGNOSIS — I2510 Atherosclerotic heart disease of native coronary artery without angina pectoris: Secondary | ICD-10-CM

## 2022-10-03 DIAGNOSIS — R42 Dizziness and giddiness: Secondary | ICD-10-CM

## 2022-10-03 DIAGNOSIS — K1370 Unspecified lesions of oral mucosa: Secondary | ICD-10-CM

## 2022-10-03 DIAGNOSIS — F5101 Primary insomnia: Secondary | ICD-10-CM

## 2022-10-03 DIAGNOSIS — F319 Bipolar disorder, unspecified: Secondary | ICD-10-CM

## 2022-10-03 MED ORDER — CLOPIDOGREL BISULFATE 75 MG PO TABS
75.0000 mg | ORAL_TABLET | Freq: Every day | ORAL | 2 refills | Status: DC
  Filled 2022-10-03: qty 90, fill #0
  Filled 2022-10-04 – 2022-11-17 (×2): qty 90, 90d supply, fill #0
  Filled 2023-01-11 – 2023-02-13 (×2): qty 90, 90d supply, fill #1
  Filled 2023-05-25: qty 90, 90d supply, fill #2

## 2022-10-03 MED ORDER — ISOSORBIDE MONONITRATE ER 60 MG PO TB24
60.0000 mg | ORAL_TABLET | Freq: Every day | ORAL | 2 refills | Status: DC
  Filled 2022-10-03 – 2022-11-17 (×2): qty 90, 90d supply, fill #0
  Filled 2023-02-13: qty 90, 90d supply, fill #1
  Filled 2023-05-25: qty 90, 90d supply, fill #2

## 2022-10-03 MED ORDER — LITHIUM CARBONATE ER 300 MG PO TBCR
300.0000 mg | EXTENDED_RELEASE_TABLET | Freq: Two times a day (BID) | ORAL | 2 refills | Status: DC
  Filled 2022-10-03 – 2022-11-17 (×3): qty 180, 90d supply, fill #0
  Filled 2023-02-13 – 2023-03-01 (×3): qty 180, 90d supply, fill #1
  Filled 2023-06-10: qty 180, 90d supply, fill #2

## 2022-10-03 MED ORDER — FLUTICASONE PROPIONATE 50 MCG/ACT NA SUSP
2.0000 | Freq: Every day | NASAL | 6 refills | Status: DC
  Filled 2022-10-03 – 2023-01-11 (×2): qty 16, 30d supply, fill #0

## 2022-10-03 MED ORDER — LORAZEPAM 1 MG PO TABS
1.0000 mg | ORAL_TABLET | Freq: Three times a day (TID) | ORAL | 1 refills | Status: DC | PRN
  Filled 2022-10-03 – 2023-01-11 (×2): qty 90, 30d supply, fill #0
  Filled 2023-02-28: qty 90, 30d supply, fill #1

## 2022-10-03 MED ORDER — WEGOVY 2.4 MG/0.75ML ~~LOC~~ SOAJ
2.4000 mg | SUBCUTANEOUS | 2 refills | Status: DC
  Filled 2022-10-03: qty 3, 28d supply, fill #0
  Filled 2022-11-17: qty 3, 28d supply, fill #1
  Filled 2022-12-13: qty 3, 28d supply, fill #2

## 2022-10-03 MED ORDER — METOPROLOL SUCCINATE ER 50 MG PO TB24
50.0000 mg | ORAL_TABLET | Freq: Every day | ORAL | 2 refills | Status: DC
  Filled 2022-10-03 – 2022-11-27 (×2): qty 90, 90d supply, fill #0
  Filled 2023-02-28: qty 90, 90d supply, fill #1
  Filled 2023-05-25: qty 90, 90d supply, fill #2

## 2022-10-03 MED ORDER — EZETIMIBE 10 MG PO TABS
10.0000 mg | ORAL_TABLET | Freq: Every day | ORAL | 2 refills | Status: DC
  Filled 2022-10-03 – 2023-01-01 (×3): qty 90, 90d supply, fill #0
  Filled 2023-03-03 – 2023-03-30 (×2): qty 90, 90d supply, fill #1
  Filled 2023-06-25 – 2023-07-01 (×2): qty 90, 90d supply, fill #2

## 2022-10-03 MED ORDER — LIDOCAINE VISCOUS HCL 2 % MT SOLN
15.0000 mL | Freq: Four times a day (QID) | OROMUCOSAL | 0 refills | Status: DC | PRN
  Filled 2022-10-03: qty 100, 2d supply, fill #0

## 2022-10-03 MED ORDER — ROSUVASTATIN CALCIUM 40 MG PO TABS
40.0000 mg | ORAL_TABLET | Freq: Every day | ORAL | 2 refills | Status: DC
  Filled 2022-10-03 – 2022-11-27 (×2): qty 90, 90d supply, fill #0
  Filled 2023-02-28: qty 90, 90d supply, fill #1
  Filled 2023-05-25: qty 90, 90d supply, fill #2

## 2022-10-03 MED ORDER — DULOXETINE HCL 60 MG PO CPEP
60.0000 mg | ORAL_CAPSULE | Freq: Two times a day (BID) | ORAL | 2 refills | Status: DC
  Filled 2022-10-03 – 2022-11-17 (×3): qty 180, 90d supply, fill #0
  Filled 2023-01-11 – 2023-02-13 (×2): qty 180, 90d supply, fill #1
  Filled 2023-05-25: qty 180, 90d supply, fill #2

## 2022-10-03 MED ORDER — HYDROCHLOROTHIAZIDE 12.5 MG PO TABS
12.5000 mg | ORAL_TABLET | Freq: Every day | ORAL | 2 refills | Status: DC
  Filled 2022-10-03 – 2022-12-26 (×3): qty 90, 90d supply, fill #0
  Filled 2023-03-30: qty 90, 90d supply, fill #1
  Filled 2023-06-25 – 2023-07-01 (×2): qty 90, 90d supply, fill #2

## 2022-10-03 MED ORDER — PANTOPRAZOLE SODIUM 40 MG PO TBEC
40.0000 mg | DELAYED_RELEASE_TABLET | Freq: Every day | ORAL | 2 refills | Status: DC
  Filled 2022-10-03 – 2022-11-17 (×2): qty 90, 90d supply, fill #0
  Filled 2023-02-13: qty 90, 90d supply, fill #1
  Filled 2023-05-25: qty 90, 90d supply, fill #2

## 2022-10-03 MED ORDER — CYCLOBENZAPRINE HCL 10 MG PO TABS
10.0000 mg | ORAL_TABLET | Freq: Three times a day (TID) | ORAL | 2 refills | Status: DC | PRN
  Filled 2022-10-03: qty 90, 30d supply, fill #0
  Filled 2022-12-26: qty 90, 30d supply, fill #1
  Filled 2023-06-10: qty 90, 30d supply, fill #2

## 2022-10-03 MED ORDER — BUPROPION HCL ER (XL) 150 MG PO TB24
150.0000 mg | ORAL_TABLET | Freq: Every day | ORAL | 2 refills | Status: DC
  Filled 2022-10-03 – 2022-11-03 (×2): qty 90, 90d supply, fill #0
  Filled 2023-01-11: qty 90, 90d supply, fill #1

## 2022-10-03 MED ORDER — MECLIZINE HCL 25 MG PO TABS
25.0000 mg | ORAL_TABLET | Freq: Three times a day (TID) | ORAL | 2 refills | Status: AC | PRN
  Filled 2022-10-03 – 2023-01-11 (×2): qty 90, 30d supply, fill #0

## 2022-10-03 MED ORDER — FEXOFENADINE HCL 60 MG PO TABS
60.0000 mg | ORAL_TABLET | Freq: Every day | ORAL | 2 refills | Status: DC
  Filled 2022-10-03 – 2023-03-30 (×5): qty 90, 90d supply, fill #0

## 2022-10-03 MED ORDER — RANOLAZINE ER 1000 MG PO TB12
1000.0000 mg | ORAL_TABLET | Freq: Two times a day (BID) | ORAL | 2 refills | Status: DC
  Filled 2022-10-03: qty 180, 90d supply, fill #0
  Filled 2023-01-11: qty 180, 90d supply, fill #1
  Filled 2023-04-30: qty 180, 90d supply, fill #2

## 2022-10-03 MED ORDER — ESZOPICLONE 3 MG PO TABS
3.0000 mg | ORAL_TABLET | Freq: Every evening | ORAL | 2 refills | Status: DC | PRN
  Filled 2022-10-03 – 2023-01-11 (×2): qty 90, 90d supply, fill #0

## 2022-10-04 ENCOUNTER — Other Ambulatory Visit: Payer: Self-pay

## 2022-10-04 ENCOUNTER — Other Ambulatory Visit (HOSPITAL_COMMUNITY): Payer: Self-pay

## 2022-10-05 ENCOUNTER — Other Ambulatory Visit: Payer: Self-pay

## 2022-10-05 ENCOUNTER — Other Ambulatory Visit (HOSPITAL_COMMUNITY): Payer: Self-pay

## 2022-10-13 ENCOUNTER — Other Ambulatory Visit: Payer: Self-pay | Admitting: Nurse Practitioner

## 2022-10-13 DIAGNOSIS — R112 Nausea with vomiting, unspecified: Secondary | ICD-10-CM

## 2022-10-13 MED ORDER — ONDANSETRON HCL 4 MG PO TABS: 4.0000 mg | ORAL_TABLET | Freq: Three times a day (TID) | ORAL | 0 refills | Status: DC | PRN

## 2022-11-03 ENCOUNTER — Other Ambulatory Visit (HOSPITAL_COMMUNITY): Payer: Self-pay

## 2022-11-05 ENCOUNTER — Other Ambulatory Visit (HOSPITAL_COMMUNITY): Payer: Self-pay

## 2022-11-05 ENCOUNTER — Other Ambulatory Visit: Payer: Self-pay

## 2022-11-06 ENCOUNTER — Ambulatory Visit: Payer: No Typology Code available for payment source | Admitting: Gastroenterology

## 2022-11-13 ENCOUNTER — Encounter: Payer: Self-pay | Admitting: Gastroenterology

## 2022-11-19 ENCOUNTER — Other Ambulatory Visit (HOSPITAL_COMMUNITY): Payer: Self-pay

## 2022-11-19 ENCOUNTER — Other Ambulatory Visit: Payer: Self-pay

## 2022-11-27 ENCOUNTER — Other Ambulatory Visit (HOSPITAL_COMMUNITY): Payer: Self-pay

## 2022-12-13 ENCOUNTER — Other Ambulatory Visit: Payer: Self-pay

## 2022-12-26 ENCOUNTER — Other Ambulatory Visit: Payer: Self-pay

## 2022-12-27 ENCOUNTER — Other Ambulatory Visit (HOSPITAL_COMMUNITY): Payer: Self-pay

## 2022-12-27 ENCOUNTER — Other Ambulatory Visit: Payer: Self-pay

## 2023-01-01 ENCOUNTER — Other Ambulatory Visit (HOSPITAL_COMMUNITY): Payer: Self-pay

## 2023-01-01 ENCOUNTER — Other Ambulatory Visit: Payer: Self-pay

## 2023-01-01 ENCOUNTER — Other Ambulatory Visit: Payer: Self-pay | Admitting: Physician Assistant

## 2023-01-01 MED ORDER — PRIMIDONE 50 MG PO TABS
50.0000 mg | ORAL_TABLET | Freq: Every day | ORAL | 1 refills | Status: DC
  Filled 2023-01-01 – 2023-02-13 (×2): qty 90, 90d supply, fill #0

## 2023-01-10 ENCOUNTER — Other Ambulatory Visit (HOSPITAL_COMMUNITY): Payer: Self-pay

## 2023-01-11 ENCOUNTER — Other Ambulatory Visit (HOSPITAL_COMMUNITY): Payer: Self-pay

## 2023-01-14 ENCOUNTER — Other Ambulatory Visit: Payer: Self-pay

## 2023-01-29 ENCOUNTER — Other Ambulatory Visit (HOSPITAL_COMMUNITY): Payer: Self-pay

## 2023-01-29 ENCOUNTER — Other Ambulatory Visit: Payer: Self-pay | Admitting: Physician Assistant

## 2023-01-29 DIAGNOSIS — E663 Overweight: Secondary | ICD-10-CM

## 2023-01-29 MED ORDER — WEGOVY 2.4 MG/0.75ML ~~LOC~~ SOAJ
2.4000 mg | SUBCUTANEOUS | 2 refills | Status: DC
  Filled 2023-01-29: qty 3, 28d supply, fill #0

## 2023-01-31 ENCOUNTER — Encounter: Payer: Self-pay | Admitting: Physician Assistant

## 2023-01-31 ENCOUNTER — Ambulatory Visit (INDEPENDENT_AMBULATORY_CARE_PROVIDER_SITE_OTHER): Payer: 59 | Admitting: Physician Assistant

## 2023-01-31 VITALS — BP 110/60 | HR 73 | Temp 97.4°F | Resp 14 | Ht 70.0 in | Wt 175.0 lb

## 2023-01-31 DIAGNOSIS — F332 Major depressive disorder, recurrent severe without psychotic features: Secondary | ICD-10-CM

## 2023-01-31 DIAGNOSIS — H9313 Tinnitus, bilateral: Secondary | ICD-10-CM | POA: Diagnosis not present

## 2023-01-31 DIAGNOSIS — F5101 Primary insomnia: Secondary | ICD-10-CM

## 2023-01-31 DIAGNOSIS — Z1211 Encounter for screening for malignant neoplasm of colon: Secondary | ICD-10-CM

## 2023-01-31 DIAGNOSIS — E7800 Pure hypercholesterolemia, unspecified: Secondary | ICD-10-CM | POA: Diagnosis not present

## 2023-01-31 DIAGNOSIS — R42 Dizziness and giddiness: Secondary | ICD-10-CM | POA: Diagnosis not present

## 2023-01-31 DIAGNOSIS — F411 Generalized anxiety disorder: Secondary | ICD-10-CM

## 2023-01-31 DIAGNOSIS — R251 Tremor, unspecified: Secondary | ICD-10-CM

## 2023-01-31 DIAGNOSIS — Z79899 Other long term (current) drug therapy: Secondary | ICD-10-CM

## 2023-01-31 DIAGNOSIS — F319 Bipolar disorder, unspecified: Secondary | ICD-10-CM

## 2023-01-31 DIAGNOSIS — Z23 Encounter for immunization: Secondary | ICD-10-CM

## 2023-01-31 DIAGNOSIS — K219 Gastro-esophageal reflux disease without esophagitis: Secondary | ICD-10-CM | POA: Diagnosis not present

## 2023-01-31 DIAGNOSIS — I10 Essential (primary) hypertension: Secondary | ICD-10-CM

## 2023-01-31 DIAGNOSIS — I25118 Atherosclerotic heart disease of native coronary artery with other forms of angina pectoris: Secondary | ICD-10-CM

## 2023-01-31 LAB — POCT URINALYSIS DIP (CLINITEK)
Bilirubin, UA: NEGATIVE
Blood, UA: NEGATIVE
Glucose, UA: NEGATIVE mg/dL
Ketones, POC UA: NEGATIVE mg/dL
Leukocytes, UA: NEGATIVE
Nitrite, UA: NEGATIVE
POC PROTEIN,UA: NEGATIVE
Spec Grav, UA: 1.02 (ref 1.010–1.025)
Urobilinogen, UA: 0.2 E.U./dL
pH, UA: 6 (ref 5.0–8.0)

## 2023-01-31 MED ORDER — BUPROPION HCL ER (XL) 150 MG PO TB24
150.0000 mg | ORAL_TABLET | Freq: Every day | ORAL | 2 refills | Status: DC
Start: 2023-01-31 — End: 2023-06-11
  Filled 2023-01-31: qty 90, 90d supply, fill #0
  Filled 2023-04-30: qty 90, 90d supply, fill #1

## 2023-02-01 ENCOUNTER — Other Ambulatory Visit (HOSPITAL_COMMUNITY): Payer: Self-pay

## 2023-02-01 ENCOUNTER — Other Ambulatory Visit: Payer: Self-pay

## 2023-02-04 DIAGNOSIS — K219 Gastro-esophageal reflux disease without esophagitis: Secondary | ICD-10-CM | POA: Insufficient documentation

## 2023-02-04 DIAGNOSIS — Z79899 Other long term (current) drug therapy: Secondary | ICD-10-CM | POA: Insufficient documentation

## 2023-02-04 DIAGNOSIS — Z1211 Encounter for screening for malignant neoplasm of colon: Secondary | ICD-10-CM | POA: Insufficient documentation

## 2023-02-04 DIAGNOSIS — F319 Bipolar disorder, unspecified: Secondary | ICD-10-CM

## 2023-02-04 DIAGNOSIS — R251 Tremor, unspecified: Secondary | ICD-10-CM | POA: Insufficient documentation

## 2023-02-04 DIAGNOSIS — R258 Other abnormal involuntary movements: Secondary | ICD-10-CM | POA: Insufficient documentation

## 2023-02-04 DIAGNOSIS — Z23 Encounter for immunization: Secondary | ICD-10-CM | POA: Insufficient documentation

## 2023-02-04 HISTORY — DX: Bipolar disorder, unspecified: F31.9

## 2023-02-04 HISTORY — DX: Tremor, unspecified: R25.1

## 2023-02-04 HISTORY — DX: Other long term (current) drug therapy: Z79.899

## 2023-02-04 HISTORY — DX: Gastro-esophageal reflux disease without esophagitis: K21.9

## 2023-02-04 NOTE — Progress Notes (Signed)
Subjective:  Patient ID: Jon Gonzales, male    DOB: [birth year]  Age: 62 y.o. MRN: [id number]  Chief Complaint  Patient presents with   Hypertension   Hyperlipidemia   WAS SEEING DR PERRY HPI   Pt presents for follow up of hypertension. The patient is tolerating the medication well without side effects. Compliance with treatment has been good; including taking medication as directed , maintains a healthy diet and regular exercise regimen , and following up as directed. He is currently taking HCTZ12.5mg  and  Toprol XL 50mg  ,  Pt also has history of CAD with open heart surgery in 2010- he is taking imdur 60mg  qd , Ranexa , crestor 40mg  , has nitrostat to use if needed , plavix 75mg  and ASA qd He follows with Dr Bing Matter regularly.  States he had nuclear stress test that was normal this past fall and has follow up appt in May.  Mixed hyperlipidemia  Pt presents with hyperlipidemia.  Compliance with treatment has been good -The patient is compliant with medications, maintains a low cholesterol diet , follows up as directed , and maintains an exercise regimen . The patient denies experiencing any hypercholesterolemia related symptoms.  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  States he has ringing in his ears and shakiness - states he uses meclizine as needed which does help relieve his symptoms.  He has been having these issues for about 2 years He was started on primidone 50mg  for his tremors and he states that has seemed to help  Pt with history of bipolar disorder with anxiety and depression.  He states at this time symptoms are controlled with medication.  He used to see psychologist but not currently.  Is not interested in referral at this time.  Currently takes wellbutrin 150mg  qd, cymbalta 60mg  bid, eszopiclone 3mg  qhs, lithobid 300mg  bid,  ativan 1mg  prn and primidone 50mg  Voices no concerns or problems today  Pt requests second shingrix vaccine today  Pt agreeable to schedule for screening colonscopy      04/18/2022   10:08 PM 01/17/2022    4:14 PM  Depression screen PHQ 2/9  Decreased Interest 3 3  Down, Depressed, Hopeless 3 2  PHQ - 2 Score 6 5  Altered sleeping 3 3  Tired, decreased energy 3 1  Change in appetite 2 1  Feeling bad or failure about yourself  3 3  Trouble concentrating 2 2  Moving slowly or fidgety/restless 0 0  Suicidal thoughts 1 1  PHQ-9 Score 20 16  Difficult doing work/chores Somewhat difficult          01/17/2022    4:14 PM 06/04/2022    8:42 AM 08/11/2022    9:58 AM 08/28/2022    1:33 PM 09/18/2022    9:35 AM  Fall Risk  Falls in the past year? 0 0 0 0 0  Was there an injury with Fall? 0 0 0 0 0  Fall Risk Category Calculator 0 0 0 0 0  Fall Risk Category (Retired) Low Low Low Low Low  (RETIRED) Patient Fall Risk Level Low fall risk Low fall risk  Low fall risk Low fall risk  Patient at Risk for Falls Due to  No Fall Risks   No Fall Risks  Fall risk Follow up  Falls evaluation completed  Falls evaluation completed Falls evaluation completed      Review of Systems CONSTITUTIONAL: Negative for chills, fatigue, fever, unintentional weight gain and unintentional weight loss.  E/N/T: Negative for ear pain, nasal congestion and sore throat.  CARDIOVASCULAR: Negative for chest pain, dizziness, palpitations and pedal edema.  RESPIRATORY: Negative for recent cough and dyspnea.  GASTROINTESTINAL: Negative for abdominal pain, acid reflux symptoms, constipation, diarrhea, nausea and vomiting.  MSK: Negative for arthralgias and myalgias.  INTEGUMENTARY: Negative for rash.  NEUROLOGICAL: Negative for dizziness and headaches.  PSYCHIATRIC: Negative for sleep disturbance and to question depression screen.  Negative for depression, negative for anhedonia.      Current Outpatient Medications on  File Prior to Visit  Medication Sig Dispense Refill   aspirin EC 81 MG tablet Take 1 tablet (81 mg total) by mouth daily. 30 tablet 0   clopidogrel (PLAVIX) 75 MG tablet Take 1 tablet (75 mg total) by mouth daily. 90 tablet 2   cyclobenzaprine (FLEXERIL) 10 MG tablet Take 1 tablet (10 mg total) by mouth 3 (three) times daily as needed for muscle spasms. 90 tablet 2   DULoxetine (CYMBALTA) 60 MG capsule Take 1 capsule (60 mg total) by mouth 2 (two) times daily. 180 capsule 2   EPINEPHrine (EPIPEN 2-PAK) 0.3 mg/0.3 mL IJ SOAJ injection Inject 0.3 mg into the muscle as needed for anaphylaxis. 1 each 3   Eszopiclone 3 MG TABS Take 1 tablet (3 mg total) by mouth immediately before bedtime as needed for sleep. 90 tablet 2   ezetimibe (ZETIA) 10 MG tablet Take 1 tablet (10 mg total) by mouth daily. 90 tablet 2   fexofenadine (ALLEGRA) 60 MG tablet Take 1 tablet (60 mg total) by mouth daily. 90 tablet 2   hydrochlorothiazide (HYDRODIURIL) 12.5 MG tablet Take 1 tablet (12.5 mg total) by mouth daily. 90 tablet 2   isosorbide mononitrate (IMDUR) 60 MG 24 hr tablet Take 1 tablet (60 mg total) by mouth daily. 90 tablet 2   lidocaine (XYLOCAINE) 2 % solution Use as directed 15 mLs in the mouth or throat every 6 (six) hours as needed for mouth pain. 100 mL 0   lithium carbonate (LITHOBID) 300 MG ER tablet Take 1 tablet (300 mg total) by mouth 2 (two) times daily. 180 tablet 2   LORazepam (ATIVAN) 1 MG tablet Take 1 tablet (1 mg total) by mouth every 8 (eight) hours as needed for anxiety. 90 tablet 1   meclizine (ANTIVERT) 25 MG tablet Take 1 tablet (25 mg total) by mouth 3 (three) times daily as needed for dizziness. 90 tablet 2   metoprolol succinate (TOPROL-XL) 50 MG 24 hr tablet Take 1 tablet by mouth daily. Take with or immediately following a meal. 90 tablet 2   Multiple Vitamin (MULTIVITAMIN WITH MINERALS) TABS tablet Take 1 tablet by mouth daily. Men's Multivitamin     nitroGLYCERIN (NITROSTAT) 0.4 MG SL  tablet Dissolve 1 tablet (0.4 mg total) under the tongue every 5 (five) minutes up to 3 doses as needed for chest pain. If no relief after 3 doses call 911. 25 tablet 1   ondansetron (ZOFRAN) 4 MG tablet Take 1 tablet (4 mg total) by mouth every 8 (eight) hours as needed for nausea or vomiting. 20 tablet 0   pantoprazole (PROTONIX) 40 MG tablet Take 1 tablet (40 mg total) by mouth daily. 90 tablet 2   primidone (MYSOLINE) 50 MG tablet Take 1 tablet (50 mg total) by mouth at bedtime. (Patient taking differently:  Take 25 mg by mouth at bedtime.) 90 tablet 1   ranolazine (RANEXA) 1000 MG SR tablet Take 1 tablet by mouth 2 times daily. 180 tablet 2   rosuvastatin (CRESTOR) 40 MG tablet Take 1 tablet (40 mg total) by mouth daily. 90 tablet 2   Semaglutide-Weight Management (WEGOVY) 2.4 MG/0.75ML SOAJ Inject 2.4 mg into the skin once a week. 3 mL 2   [DISCONTINUED] diltiazem (CARDIZEM SR) 60 MG 12 hr capsule Take 1 capsule (60 mg total) by mouth 2 (two) times daily. 180 capsule 2   [DISCONTINUED] pantoprazole (PROTONIX) 40 MG tablet Take 1 tablet (40 mg total) by mouth daily. 30 tablet 3   [DISCONTINUED] triamterene-hydrochlorothiazide (DYAZIDE) 37.5-25 MG capsule Take 1 each (1 capsule total) by mouth daily. 90 capsule 2   No current facility-administered medications on file prior to visit.   Past Medical History:  Diagnosis Date   Abscess of buttock 11/23/2020   Allergy    Angina pectoris 10/26/2019   Anxiety, generalized 05/22/2016   Bilateral leg edema 01/29/2020   CAD (coronary artery disease)    Cath 2013 LAD 95% stenosis, D1 90% stenosis, RCA 20%, Circ 30%.  Previous stent to D1 2012.   EF 60%.     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   Dehydration 11/07/2020   Depression    Dizziness 10/27/2020   Excessive daytime sleepiness 09/11/2016   GERD (gastroesophageal reflux disease)    Herpes zoster 01/20/2020    History of small bowel obstruction    Admitted 10-14-2007 for partical small bowel obstruction and N.G. tube was placed. Admitted by Dr Dimas Chyle. 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 Small internal hemorrhoids. Otherwise normal colonoscopy to terminal ileum   CORONARY ARTERY BYPASS GRAFT  02/19/2012   L - LAD, SVG - D1, SVG - OM   CORONARY STENT INTERVENTION N/A 11/05/2019   Procedure: CORONARY STENT INTERVENTION;  Surgeon: Runell Gess, MD;  Location: [facility];  Service: Cardiovascular;  Laterality: N/A;  SVG to DIAG   ESOPHAGOGASTRODUODENOSCOPY  01/08/2007   Multiple duodenal ulcers with stenosis/stricture of the distal first portion of the duodenum (measuring approximantely 9mm) Mild gastritis.   LEFT HEART CATH AND CORS/GRAFTS ANGIOGRAPHY N/A 11/05/2019   Procedure: LEFT HEART CATH AND CORS/GRAFTS ANGIOGRAPHY;  Surgeon: Runell Gess, MD;  Location: [facility];  Service: Cardiovascular;  Laterality: N/A;   RENAL ANGIOGRAPHY N/A 11/05/2019   Procedure: RENAL ANGIOGRAPHY;  Surgeon: Runell Gess, MD;  Location: [facility];   Service: Cardiovascular;  Laterality: N/A;   TONSILLECTOMY AND ADENOIDECTOMY  WRIST SURGERY      Family History  Problem Relation Age of Onset   CAD Father 25   Aneurysm Mother 68       cerebral   Heart disease Sister        Unclear   Healthy Brother    Social History   Socioeconomic History   Marital status: Married    Spouse name: Not on file   Number of children: Not on file   Years of education: Not on file   Highest education level: Bachelor's degree (e.g., BA, AB, BS)  Occupational History   Not on file  Tobacco Use   Smoking status: Never   Smokeless tobacco: Never  Vaping Use   Vaping Use: Never used  Substance and Sexual Activity   Alcohol use: Yes    Alcohol/week: 6.0 standard drinks of alcohol    Types: 6 Cans of beer per week    Comment: Couple of beers   Drug use: Never   Sexual activity: Not on file  Other Topics Concern   Not on file  Social History Narrative   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        Right Handed   Lives in two story home    Social Determinants of Health   Financial Resource Strain: Low Risk  (06/04/2022)   Overall Financial Resource Strain (CARDIA)    Difficulty of Paying Living Expenses: Not hard at all  Food Insecurity: No Food Insecurity (06/04/2022)   Hunger Vital Sign    Worried About Running Out of Food in the Last Year: Never true    Ran Out of Food in the Last Year: Never true  Transportation Needs: No Transportation Needs (06/04/2022)   PRAPARE - Administrator, Civil Service (Medical): No    Lack of Transportation (Non-Medical): No  Physical Activity: Inactive (06/04/2022)   Exercise Vital Sign    Days of Exercise per Week: 0 days    Minutes of Exercise per Session: 0 min  Stress: No Stress Concern Present (06/04/2022)   [employer] of Occupational Health - Occupational Stress Questionnaire    Feeling of Stress : Not at all  Social Connections: Moderately  Integrated (06/04/2022)   Social Connection and Isolation Panel [NHANES]    Frequency of Communication with Friends and Family: More than three times a week    Frequency of Social Gatherings with Friends and Family: More than three times a week    Attends Religious Services: Never    Database administrator or Organizations: Yes    Attends Engineer, structural: More than 4 times per year    Marital Status: Married    Objective:  PHYSICAL EXAM:   VS: BP 110/60   Pulse 73   Temp (!) 97.4 F (36.3 C)   Resp 14   Ht 5\' 10"  (1.778 m)   Wt 175 lb (79.4 kg)   SpO2 98%   BMI 25.11 kg/m   GEN: Well nourished, well developed, in no acute distress  Cardiac: RRR; no murmurs, rubs, or gallops,no edema - Respiratory:  normal respiratory rate and pattern with no distress - normal breath sounds with no rales, rhonchi, wheezes or rubs GI: normal bowel sounds, no masses or tenderness MS: no deformity or atrophy  Skin: warm and dry, no rash  Neuro:  Alert and Oriented x 3,  - CN II-Xii grossly intact Psych: euthymic mood, appropriate affect  and demeanor   Lab Results  Component Value Date   WBC 6.2 06/04/2022   HGB 13.6 06/04/2022   HCT 41.1 06/04/2022   PLT 159 06/04/2022   GLUCOSE 85 06/04/2022   CHOL 137 02/28/2022   TRIG 147 02/28/2022   HDL 48 02/28/2022   LDLCALC 64 02/28/2022   ALT 16 06/04/2022   AST 17 06/04/2022   NA 140 06/04/2022   K 4.5 06/04/2022   CL 104 06/04/2022   CREATININE 1.20 06/04/2022   BUN 12 06/04/2022   CO2 22 06/04/2022   TSH 1.970 06/04/2022      Assessment & Plan:    Coronary artery disease of native artery of native heart with stable angina pectoris Continue meds and follow up with cardiology as directed GAD (generalized anxiety disorder) Continue current meds Severe episode of recurrent major depressive disorder, without psychotic features -     buPROPion HCl ER (XL); Take 1 tablet (150 mg total) by mouth daily.  Dispense: 90  tablet; Refill: 2  Vertigo Continue meclizine prn Follow up with ENT as directed Primary insomnia Continue med Gastroesophageal reflux disease, unspecified whether esophagitis present Continue protonix  Primary hypertension -     CBC with Differential/Platelet; Future -     Comprehensive metabolic panel; Future -     TSH; Future -     POCT URINALYSIS DIP (CLINITEK)  Hypercholesterolemia -     Lipid panel; Future Continue meds Watch diet Tinnitus of both ears Continue follow up with ENT Long-term current use of lithium -     Lithium level; Future  Bipolar 1 disorder Continue current meds  Tremor Continue primidone  Need for vaccination for zoster -     Varicella-zoster vaccine IM  Colon cancer screening -     Ambulatory referral to Gastroenterology     Meds ordered this encounter  Medications   buPROPion (WELLBUTRIN XL) 150 MG 24 hr tablet    Sig: Take 1 tablet (150 mg total) by mouth daily.    Dispense:  90 tablet    Refill:  2    Order Specific Question:   Supervising Provider    AnswerCorey Harold    Orders Placed This Encounter  Procedures   Varicella-zoster vaccine IM   CBC with Differential/Platelet   Comprehensive metabolic panel   TSH   Lipid panel   Lithium level   Ambulatory referral to Gastroenterology   POCT URINALYSIS DIP (CLINITEK)     Follow-up: Return in about 4 months (around 06/02/2023) for chronic fasting follow up.   I,SARA R Fedora Knisely,acting as a scribe for [employer] R Patsie Mccardle, PA-C.,have documented all relevant documentation on the behalf of SARA R Rashawn Rolon, PA-C,as directed by  SARA R Hazen Brumett, PA-C while in the presence of SARA R Romon Devereux, PA-C.   An After Visit Summary was printed and given to the patient.  Jettie Pagan [facility] [phone]

## 2023-02-04 NOTE — Addendum Note (Signed)
Addended by: [name] on: 02/04/2023 01:52 PM   Modules accepted: Level of Service

## 2023-02-05 ENCOUNTER — Other Ambulatory Visit: Payer: Self-pay

## 2023-02-05 DIAGNOSIS — I10 Essential (primary) hypertension: Secondary | ICD-10-CM

## 2023-02-05 DIAGNOSIS — Z79899 Other long term (current) drug therapy: Secondary | ICD-10-CM | POA: Diagnosis not present

## 2023-02-05 DIAGNOSIS — R899 Unspecified abnormal finding in specimens from other organs, systems and tissues: Secondary | ICD-10-CM | POA: Diagnosis not present

## 2023-02-05 DIAGNOSIS — E7800 Pure hypercholesterolemia, unspecified: Secondary | ICD-10-CM | POA: Diagnosis not present

## 2023-02-06 ENCOUNTER — Other Ambulatory Visit: Payer: Self-pay | Admitting: Physician Assistant

## 2023-02-06 DIAGNOSIS — R899 Unspecified abnormal finding in specimens from other organs, systems and tissues: Secondary | ICD-10-CM

## 2023-02-06 LAB — CBC WITH DIFFERENTIAL/PLATELET
Basophils Absolute: 0 10*3/uL (ref 0.0–0.2)
Basos: 1 %
EOS (ABSOLUTE): 0.2 10*3/uL (ref 0.0–0.4)
Eos: 4 %
Hematocrit: 40 % (ref 37.5–51.0)
Hemoglobin: 13.7 g/dL (ref 13.0–17.7)
Immature Grans (Abs): 0 10*3/uL (ref 0.0–0.1)
Immature Granulocytes: 1 %
Lymphocytes Absolute: 1.2 10*3/uL (ref 0.7–3.1)
Lymphs: 28 %
MCH: 34 pg — ABNORMAL HIGH (ref 26.6–33.0)
MCHC: 34.3 g/dL (ref 31.5–35.7)
MCV: 99 fL — ABNORMAL HIGH (ref 79–97)
Monocytes Absolute: 0.4 10*3/uL (ref 0.1–0.9)
Monocytes: 8 %
Neutrophils Absolute: 2.6 10*3/uL (ref 1.4–7.0)
Neutrophils: 58 %
Platelets: 137 10*3/uL — ABNORMAL LOW (ref 150–450)
RBC: 4.03 x10E6/uL — ABNORMAL LOW (ref 4.14–5.80)
RDW: 12.4 % (ref 11.6–15.4)
WBC: 4.3 10*3/uL (ref 3.4–10.8)

## 2023-02-06 LAB — COMPREHENSIVE METABOLIC PANEL
ALT: 26 IU/L (ref 0–44)
AST: 24 IU/L (ref 0–40)
Albumin/Globulin Ratio: 1.6 (ref 1.2–2.2)
Albumin: 3.9 g/dL (ref 3.9–4.9)
Alkaline Phosphatase: 53 IU/L (ref 44–121)
BUN/Creatinine Ratio: 11 (ref 10–24)
BUN: 13 mg/dL (ref 8–27)
Bilirubin Total: 0.6 mg/dL (ref 0.0–1.2)
CO2: 22 mmol/L (ref 20–29)
Calcium: 8.7 mg/dL (ref 8.6–10.2)
Chloride: 102 mmol/L (ref 96–106)
Creatinine, Ser: 1.15 mg/dL (ref 0.76–1.27)
Globulin, Total: 2.4 g/dL (ref 1.5–4.5)
Glucose: 89 mg/dL (ref 70–99)
Potassium: 4.3 mmol/L (ref 3.5–5.2)
Sodium: 140 mmol/L (ref 134–144)
Total Protein: 6.3 g/dL (ref 6.0–8.5)
eGFR: 72 mL/min/{1.73_m2} (ref >59)

## 2023-02-06 LAB — LIPID PANEL
Chol/HDL Ratio: 2.1 ratio (ref 0.0–5.0)
Cholesterol, Total: 116 mg/dL (ref 100–199)
HDL: 55 mg/dL (ref >39)
LDL Chol Calc (NIH): 45 mg/dL (ref 0–99)
Triglycerides: 80 mg/dL (ref 0–149)
VLDL Cholesterol Cal: 16 mg/dL (ref 5–40)

## 2023-02-06 LAB — CARDIOVASCULAR RISK ASSESSMENT

## 2023-02-06 LAB — TSH: TSH: 3.21 u[IU]/mL (ref 0.450–4.500)

## 2023-02-06 LAB — LITHIUM LEVEL: Lithium Lvl: 0.9 mmol/L (ref 0.5–1.2)

## 2023-02-07 LAB — SPECIMEN STATUS REPORT

## 2023-02-07 LAB — B12 AND FOLATE PANEL
Folate: 19 ng/mL (ref >3.0)
Vitamin B-12: 718 pg/mL (ref 232–1245)

## 2023-02-14 ENCOUNTER — Other Ambulatory Visit (HOSPITAL_COMMUNITY): Payer: Self-pay

## 2023-02-14 ENCOUNTER — Other Ambulatory Visit: Payer: Self-pay

## 2023-02-15 ENCOUNTER — Other Ambulatory Visit: Payer: Self-pay

## 2023-02-18 ENCOUNTER — Other Ambulatory Visit (HOSPITAL_COMMUNITY): Payer: Self-pay

## 2023-02-21 ENCOUNTER — Other Ambulatory Visit: Payer: Self-pay

## 2023-02-21 ENCOUNTER — Other Ambulatory Visit (HOSPITAL_COMMUNITY): Payer: Self-pay

## 2023-02-22 ENCOUNTER — Other Ambulatory Visit (HOSPITAL_COMMUNITY): Payer: Self-pay

## 2023-02-22 ENCOUNTER — Other Ambulatory Visit: Payer: Self-pay

## 2023-02-25 ENCOUNTER — Other Ambulatory Visit (HOSPITAL_COMMUNITY): Payer: Self-pay

## 2023-02-26 ENCOUNTER — Other Ambulatory Visit (HOSPITAL_COMMUNITY): Payer: Self-pay

## 2023-03-01 ENCOUNTER — Other Ambulatory Visit (HOSPITAL_COMMUNITY): Payer: Self-pay

## 2023-03-01 ENCOUNTER — Other Ambulatory Visit: Payer: Self-pay

## 2023-03-01 ENCOUNTER — Other Ambulatory Visit: Payer: Self-pay | Admitting: Physician Assistant

## 2023-03-01 DIAGNOSIS — L03119 Cellulitis of unspecified part of limb: Secondary | ICD-10-CM

## 2023-03-01 MED ORDER — CEPHALEXIN 500 MG PO CAPS
500.0000 mg | ORAL_CAPSULE | Freq: Two times a day (BID) | ORAL | 0 refills | Status: DC
Start: 2023-03-01 — End: 2023-06-11

## 2023-03-02 ENCOUNTER — Other Ambulatory Visit (HOSPITAL_COMMUNITY): Payer: Self-pay

## 2023-03-04 ENCOUNTER — Other Ambulatory Visit: Payer: Self-pay

## 2023-03-06 ENCOUNTER — Other Ambulatory Visit (HOSPITAL_COMMUNITY): Payer: Self-pay

## 2023-03-06 ENCOUNTER — Telehealth: Payer: Self-pay

## 2023-03-06 NOTE — Telephone Encounter (Addendum)
PA for wegovy submitted 02/19/23 via covermymeds and denied. "Jon Gonzales is classified as a weight loss GLP1 receptor agonist. Weight loss GLP-1 receptor agonist medications are not covered under the pharmacy benefit". Appeal requested 02/25/23 by Milana Na, CMA. No documentation has been received. Called today and spoke with [facility], Pharmacy Help Desk whom stated she will refax over paperwork for appeal. Fax cover sheet 1 of 1 received with a note stating "fax notes". Office notes have been faxed to [phone] via epic.    03/12/23: See media. Appeal denied by insurance "because you plan does not allow certain drugs or classes of drugs to be covered under your pharmacy benefit."

## 2023-03-12 ENCOUNTER — Telehealth: Payer: Self-pay

## 2023-03-12 NOTE — Telephone Encounter (Signed)
Appeal denied, see media.

## 2023-03-20 ENCOUNTER — Encounter: Payer: Self-pay | Admitting: Psychology

## 2023-03-21 ENCOUNTER — Ambulatory Visit: Payer: Self-pay

## 2023-03-21 ENCOUNTER — Ambulatory Visit: Payer: 59 | Admitting: Psychology

## 2023-03-21 ENCOUNTER — Encounter: Payer: Self-pay | Admitting: Psychology

## 2023-03-21 DIAGNOSIS — G3184 Mild cognitive impairment, so stated: Secondary | ICD-10-CM | POA: Diagnosis not present

## 2023-03-21 DIAGNOSIS — R251 Tremor, unspecified: Secondary | ICD-10-CM | POA: Diagnosis not present

## 2023-03-21 DIAGNOSIS — R4189 Other symptoms and signs involving cognitive functions and awareness: Secondary | ICD-10-CM

## 2023-03-21 DIAGNOSIS — F319 Bipolar disorder, unspecified: Secondary | ICD-10-CM

## 2023-03-21 HISTORY — DX: Mild cognitive impairment of uncertain or unknown etiology: G31.84

## 2023-03-21 NOTE — Progress Notes (Signed)
   Psychometrician Note   Cognitive testing was administered to Jon Gonzales by Shan Levans, B.S. (psychometrist) under the supervision of Dr. Newman Nickels, Ph.D., licensed psychologist on 03/21/2023. Mr. Nohl did not appear overtly distressed by the testing session per behavioral observation or responses across self-report questionnaires. Rest breaks were offered.    The battery of tests administered was selected by Dr. Newman Nickels, Ph.D. with consideration to Mr. Storer's current level of functioning, the nature of his symptoms, emotional and behavioral responses during interview, level of literacy, observed level of motivation/effort, and the nature of the referral question. This battery was communicated to the psychometrist. Communication between Dr. Newman Nickels, Ph.D. and the psychometrist was ongoing throughout the evaluation and Dr. Newman Nickels, Ph.D. was immediately accessible at all times. Dr. Newman Nickels, Ph.D. provided supervision to the psychometrist on the date of this service to the extent necessary to assure the quality of all services provided.    Jon Cooks Seidl will return within approximately 1-2 weeks for an interactive feedback session with Dr. Milbert Coulter at which time his test performances, clinical impressions, and treatment recommendations will be reviewed in detail. Mr. Detoro understands he can contact our office should he require our assistance before this time.  A total of 140 minutes of billable time were spent face-to-face with Mr. Anastasiou by the psychometrist. This includes both test administration and scoring time. Billing for these services is reflected in the clinical report generated by Dr. Newman Nickels, Ph.D.  This note reflects time spent with the psychometrician and does not include test scores or any clinical interpretations made by Dr. Milbert Coulter. The full report will follow in a separate note.

## 2023-03-21 NOTE — Progress Notes (Signed)
NEUROPSYCHOLOGICAL EVALUATION [facility] [place] Department of Neurology  Date of Evaluation: March 21, 2023  Reason for Referral:   Jon Gonzales is a 62 y.o. right-handed Caucasian male referred by [name], D.O., to characterize his current cognitive functioning and assist with diagnostic clarity and treatment planning in the context of subjective cognitive decline, psychiatric comorbidities, and concerns for medication induced tremor experiences.   Assessment and Plan:   Clinical Impression(s): Jon Gonzales's pattern of performance is suggestive of primary impairments surrounding complex attention and encoding (i.e., learning) aspects of memory. Verbal fluency represented a relative weakness while performance variability was exhibited across processing speed, executive functioning, and delayed retrieval/consolidation aspects of memory. Performances were appropriate relative to age-matched peers across basic attention, safety/judgment, receptive language, confrontation naming, and visuospatial abilities. Jon Gonzales denied difficulties completing instrumental activities of daily living (ADLs) independently. As such, given evidence for cognitive dysfunction described above, he meets criteria for a Mild Neurocognitive Disorder ("mild cognitive impairment") at the present time.  The etiology for ongoing dysfunction is unclear and potentially mixed as Jon Gonzales exhibited a fairly nonspecific pattern of impairment. Exhibited testing patterns can be commonly seen in a variety of presentations including ongoing psychiatric distress (Jon Gonzales has a history of bipolar disorder and reported moderate levels of anxiety and depression during the past 1-2 weeks across questionnaires), sleep dysfunction (he reported mild difficulties over the past 1-2 weeks and it is unclear if he is actively treating obstructive sleep apnea concerns), and medication side effects (especially lorazepam, lithium, and  meclizine). Depending on what he is using as an over-the-counter sleep aid, many of these also have anticholinergic properties. There remains a possibility that current difficulties are most directly related to a combination of these factors.   Neurologically speaking, this nonspecific pattern is fairly common in Parkinson's disease, multiple system atrophy (MSA) and other similar parkinsonian conditions. It is worth highlighting that Jon Gonzales did describe bilateral upper extremity tremors which occur sporadically and some changes to his gait/balance. Previously, these symptoms have been primarily attributed to medication side effects and the impact of Meniere's disease rather than concern for Parkinson's disease or something like MSA-P. Outside a few seemingly isolated instances of seeing "grub worms" in his vicinity, he described no fully-formed hallucinations or worrisome concerns for Lewy body disease or another more rare parkinsonian presentation. Even in the cases of his grub worm experiences, it was unclear if this occurred while in the midst of a dizzy/vertigo spell. His non-specific pattern is also quite common with advanced cerebrovascular disease; however, prior brain MRIs have not suggested any concerns, making this unlikely. Continued medical monitoring will be important moving forward.   Specific to memory, Jon Gonzales demonstrated primary impairments efficiently learning new information. This may represent a secondary impairment relative to deficits in processing speed and complex attention. Importantly, retention rates ranged from 75% to 100% across all four memory tasks and he performed adequately across yes/no recognition trials. This would suggest that information he is able to learn is stored adequately and he does not demonstrate concerns for rapid forgetting or a profound storage impairment. As such, current testing is not worrisome for an early-onset Alzheimer's disease presentation at the  present time. Behavioral and testing patterns are also not compelling for frontotemporal lobar degeneration presently.  Recommendations: A repeat neuropsychological evaluation in 18-24 months (or sooner if functional decline is noted) is recommended to assess the trajectory of future cognitive decline should it occur. This will also  aid in future efforts towards improved diagnostic clarity.  If there are more pressing concerns surrounding Parkinson's disease or an illness in this family, an alpha-synuclein skin biopsy or DaTscan could be considered. He could discuss these with Dr. Arbutus Gonzales directly. There remains the potential that current medications could skew the results of these tasks and need to be adjusted in order to get accurate results. The utility of a PET scan or lumbar puncture would appear diminished given that there are not compelling testing patterns for Alzheimer's disease or Lewy body disease presently.   A combination of medication and psychotherapy has been shown to be most effective at treating symptoms of anxiety and depression. As such, Jon Gonzales is encouraged to speak with his prescribing physician regarding medication adjustments to optimally manage these symptoms. Likewise, Jon Gonzales is encouraged to consider engaging in short-term psychotherapy to address symptoms of psychiatric distress. He would benefit from an active and collaborative therapeutic environment, rather than one purely supportive in nature. Recommended treatment modalities include Cognitive Behavioral Therapy (CBT) or Acceptance and Commitment Therapy (ACT).  Performance across neurocognitive testing is not a strong predictor of an individual's safety operating a motor vehicle. Should his family wish to pursue a formalized driving evaluation, they could reach out to the following agencies: [employer] in [place]: [phone] [facility]: [phone] [facility]:  [phone] [facility]: [phone] or [phone]  If not already done, Jon Gonzales and his family may want to discuss his wishes regarding durable power of attorney and medical decision making, so that he can have input into these choices. If they require legal assistance with this, long-term care resource access, or other aspects of estate planning, they could reach out to [employer] at [phone] for a free consultation.  Mr. Kepner is encouraged to attend to lifestyle factors for brain health (e.g., regular physical exercise, good nutrition habits and consideration of the MIND-DASH diet, regular participation in cognitively-stimulating activities, and general stress management techniques), which are likely to have benefits for both emotional adjustment and cognition. In fact, in addition to promoting good general health, regular exercise incorporating aerobic activities (e.g., brisk walking, jogging, cycling, etc.) has been demonstrated to be a very effective treatment for depression and stress, with similar efficacy rates to both antidepressant medication and psychotherapy. Optimal control of vascular risk factors (including safe cardiovascular exercise and adherence to dietary recommendations) is encouraged. 100% compliance with his CPAP machine will also be important. Continued participation in activities which provide mental stimulation and social interaction is also recommended.   If interested, there are some activities which have therapeutic value and can be useful in keeping him cognitively stimulated. For suggestions, Mr. Morrisson is encouraged to go to the following website: https://www.[facility].org/get-to-know-barrow/centers-programs/neurorehabilitation-center/neuro-rehab-apps-and-games/ which has options, categorized by level of difficulty. It should be noted that these activities should not be viewed as a substitute for therapy.  Memory can be improved using internal strategies such  as rehearsal, repetition, chunking, mnemonics, association, and imagery. External strategies such as written notes in a consistently used memory journal, visual and nonverbal auditory cues such as a calendar on the refrigerator or appointments with alarm, such as on a cell phone, can also help maximize recall.    When learning new information, he would benefit from information being broken up into small, manageable pieces. he may also find it helpful to articulate the material in his own words and in a context to promote encoding at the onset of a new task.  This material may need to be repeated multiple times to promote encoding.  Because he shows better recall for structured information, he will likely understand and retain new information better if it is presented to him in a meaningful or well-organized manner at the outset, such as grouping items into meaningful categories or presenting information in an outlined, bulleted, or story format.  To address problems with processing speed, he may wish to consider:   -Ensuring that he is alerted when essential material or instructions are being presented   -Adjusting the speed at which new information is presented   -Allowing for more time in comprehending, processing, and responding in conversation   -Repeating and paraphrasing instructions or conversations aloud  To address problems with fluctuating attention and/or executive dysfunction, he may wish to consider:   -Avoiding external distractions when needing to concentrate   -Limiting exposure to fast paced environments with multiple sensory demands   -Writing down complicated information and using checklists   -Attempting and completing one task at a time (i.e., no multi-tasking)   -Verbalizing aloud each step of a task to maintain focus   -Taking frequent breaks during the completion of steps/tasks to avoid fatigue   -Reducing the amount of information considered at one time   -Scheduling more  difficult activities for a time of day where he is usually most alert  Review of Records:   Mr. Vanhooser was seen by [facility] Melvyn Novas, M.D.) on 09/11/2016 for a sleep consultation. At that time, he reported bouts of insomnia. He also noted his wife stating that he had been snoring over the past six months. He further acknowledged waking feeling hoarse and congested. He was referred for a sleep study, which was conducted on 10/26/2016. Results suggested the presence of mild obstructive sleep apnea (overall AHI of 6.7) and active CPAP treatment was recommended. At his follow-up with Dr. Vickey Huger on 01/03/2017, she reported that she would arrange for a narcolepsy evaluation given disproportional daytime fatigue. I was unable to locate records to see if this evaluation was completed.  Neurologically, he was next seen by [facility] Lurena Joiner Tat, D.O.) on 07/21/2019 for an evaluation of tremor. Tremor was first observed around August 2020 after contracting COVID-19. Symptoms include bilateral symmetric upper extremity shakiness. Postural symptoms were said to be worse at night when waking to use the restroom. His wife reported greater balance instability since having COVID-19. At that time, Dr. Arbutus Gonzales suspected a lithium-induced tremulous experience which was exacerbated by his COVID-19 illness.   Mr. Welden was next seen by Dr. Arbutus Gonzales on 08/28/2022. At that time, he reported sporadic tremulous experiences, often with associated dizziness and spinning/vertigo experiences. He was started on meclizine which has been helpful. He had also been placed on primidone at bedtime for tremor and felt that this has been helpful. During this appointment, he also described memory concerns. Primary examples included losing his train of thought and infrequently forgetting to take his medications. Ultimately, Mr. Magann was referred for a comprehensive neuropsychological evaluation to characterize his cognitive  abilities and to assist with diagnostic clarity and treatment planning.   Neuroimaging Brain MRI on 08/10/2019 was unremarkable. A previously seen area of restricted diffusion and T2 abnormality within the splenium of the corpus callosum had reportedly resolved. Brain MRI on 09/28/2020 was unremarkable. Brain MRI on 06/25/2022 was also unremarkable.   Past Medical History:  Diagnosis Date   Abnormal stress test small area of mild ischemia inferior wall 05/24/2022   Abscess  of buttock 11/23/2020   Bilateral leg edema 01/29/2020   Bipolar 1 disorder 02/04/2023   Chronic headaches 01/30/2021   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   Coronary artery disease involving coronary bypass graft of native heart with angina pectoris 05/22/2016   Cath 2013 LAD 95% stenosis, D1 90% stenosis, RCA 20%, Circ 30%. Previous stent to D1 2012. EF 60%.   Dizziness 10/27/2020   Excessive daytime sleepiness 09/11/2016   Gastroesophageal reflux disease 02/04/2023   Generalized anxiety disorder 05/22/2016   Herpes zoster 01/20/2020   History of small bowel obstruction    Admitted 10-14-2007 for partical small bowel obstruction and N.G. tube was placed. Admitted by Dr Dimas Chyle. 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 Small internal hemorrhoids. Otherwise normal colonoscopy to terminal ileum   CORONARY ARTERY BYPASS GRAFT  02/19/2012   L - LAD, SVG - D1, SVG - OM   CORONARY STENT INTERVENTION N/A 11/05/2019   Procedure: CORONARY STENT INTERVENTION;  Surgeon: Runell Gess, MD;  Location: [facility] INVASIVE CV [facility];  Service: Cardiovascular;  Laterality: N/A;  SVG to DIAG   ESOPHAGOGASTRODUODENOSCOPY  01/08/2007   Multiple duodenal ulcers with stenosis/stricture of the distal first portion of the duodenum (measuring approximantely 9mm) Mild gastritis.   LEFT HEART CATH AND CORS/GRAFTS ANGIOGRAPHY N/A 11/05/2019   Procedure: LEFT HEART CATH AND CORS/GRAFTS ANGIOGRAPHY;  Surgeon: Runell Gess, MD;  Location: [facility] IN[facility] CV [facility];  Service: Cardiovascular;  Laterality: N/A;   RENAL ANGIOGRAPHY N/A 11/05/2019   Procedure: RENAL ANGIOGRAPHY;  Surgeon: Runell Gess, MD;  Location: [facility]VAS[facility] CV [facility];  Service: Cardiovascular;  Laterality: N/A;   TONSILLECTOMY AND ADENOIDECTOMY     WRIST SURGERY      Current Outpatient Medications:    cephALEXin (KEFLEX) 500 MG capsule, Take 1 capsule (500 mg total) by mouth 2 (two) times daily., Disp: 20 capsule, Rfl: 0   aspirin EC 81 MG tablet, Take 1 tablet (81 mg total) by mouth daily., Disp: 30 tablet, Rfl: 0   buPROPion (WELLBUTRIN XL) 150 MG 24 hr tablet, Take 1 tablet (150 mg total) by mouth daily., Disp: 90 tablet, Rfl: 2   clopidogrel (PLAVIX) 75 MG tablet, Take 1 tablet (75 mg total) by mouth daily., Disp: 90 tablet, Rfl: 2   cyclobenzaprine (FLEXERIL) 10 MG tablet, Take 1 tablet (10 mg total) by mouth 3 (three) times daily as needed for muscle spasms., Disp: 90 tablet, Rfl: 2   DULoxetine (CYMBALTA) 60 MG capsule, Take 1 capsule (60 mg total) by mouth 2 (two) times daily., Disp: 180 capsule, Rfl: 2   EPINEPHrine (EPIPEN 2-PAK) 0.3  mg/0.3 mL IJ SOAJ injection, Inject 0.3 mg into  the muscle as needed for anaphylaxis., Disp: 1 each, Rfl: 3   Eszopiclone 3 MG TABS, Take 1 tablet (3 mg total) by mouth immediately before bedtime as needed for sleep., Disp: 90 tablet, Rfl: 2   ezetimibe (ZETIA) 10 MG tablet, Take 1 tablet (10 mg total) by mouth daily., Disp: 90 tablet, Rfl: 2   fexofenadine (ALLEGRA) 60 MG tablet, Take 1 tablet (60 mg total) by mouth daily., Disp: 90 tablet, Rfl: 2   hydrochlorothiazide (HYDRODIURIL) 12.5 MG tablet, Take 1 tablet (12.5 mg total) by mouth daily., Disp: 90 tablet, Rfl: 2   isosorbide mononitrate (IMDUR) 60 MG 24 hr tablet, Take 1 tablet (60 mg total) by mouth daily., Disp: 90 tablet, Rfl: 2   lidocaine (XYLOCAINE) 2 % solution, Use as directed 15 mLs in the mouth or throat every 6 (six) hours as needed for mouth pain., Disp: 100 mL, Rfl: 0   lithium carbonate (LITHOBID) 300 MG ER tablet, Take 1 tablet (300 mg total) by mouth 2 (two) times daily., Disp: 180 tablet, Rfl: 2   LORazepam (ATIVAN) 1 MG tablet, Take 1 tablet (1 mg total) by mouth every 8 (eight) hours as needed for anxiety., Disp: 90 tablet, Rfl: 1   meclizine (ANTIVERT) 25 MG tablet, Take 1 tablet (25 mg total) by mouth 3 (three) times daily as needed for dizziness., Disp: 90 tablet, Rfl: 2   metoprolol succinate (TOPROL-XL) 50 MG 24 hr tablet, Take 1 tablet by mouth daily. Take with or immediately following a meal., Disp: 90 tablet, Rfl: 2   Multiple Vitamin (MULTIVITAMIN WITH MINERALS) TABS tablet, Take 1 tablet by mouth daily. Men's Multivitamin, Disp: , Rfl:    nitroGLYCERIN (NITROSTAT) 0.4 MG SL tablet, Dissolve 1 tablet (0.4 mg total) under the tongue every 5 (five) minutes up to 3 doses as needed for chest pain. If no relief after 3 doses call 911., Disp: 25 tablet, Rfl: 1   ondansetron (ZOFRAN) 4 MG tablet, Take 1 tablet (4 mg total) by mouth every 8 (eight) hours as needed for nausea or vomiting., Disp: 20 tablet, Rfl: 0    pantoprazole (PROTONIX) 40 MG tablet, Take 1 tablet (40 mg total) by mouth daily., Disp: 90 tablet, Rfl: 2   primidone (MYSOLINE) 50 MG tablet, Take 1 tablet (50 mg total) by mouth at bedtime. (Patient taking differently: Take 25 mg by mouth at bedtime.), Disp: 90 tablet, Rfl: 1   ranolazine (RANEXA) 1000 MG SR tablet, Take 1 tablet by mouth 2 times daily., Disp: 180 tablet, Rfl: 2   rosuvastatin (CRESTOR) 40 MG tablet, Take 1 tablet (40 mg total) by mouth daily., Disp: 90 tablet, Rfl: 2   Semaglutide-Weight Management (WEGOVY) 2.4 MG/0.75ML SOAJ, Inject 2.4 mg into the skin once a week., Disp: 3 mL, Rfl: 2  Clinical Interview:   The following information was obtained during a clinical interview with Mr. Ogando prior to cognitive testing.  Cognitive Symptoms: Decreased short-term memory: Endorsed. Primary examples included losing his train of thought mid sentence, as well as trouble recalling names and details of recently held conversations. Difficulties were said to be present for the past 6-12 months and, per his perception, have gradually declined over that time. Separately, his wife described concerns surrounding Mr. Jessop quickly forgetting information and becoming more repetitive in day-to-day conversation.  Decreased long-term memory: Denied. Decreased attention/concentration: Endorsed. Specifically, he reported "maybe a little more" distractibility in his day-to-day life. Reduced processing speed: Endorsed. He reported that mental processing has "probably" slowed down somewhat.  Difficulties with executive functions: Endorsed. Specifically, he reported "maybe trouble with multi-tasking a little bit." He did not describe prominent trouble with organization, decision making, or impulsivity. Significant personality changes were also denied.  Difficulties with emotion regulation: Denied. Difficulties with receptive language: Denied. Difficulties with word finding: Denied. Decreased visuoperceptual  ability: Denied.  Difficulties completing ADLs: Denied.  Additional Medical History: History of traumatic brain injury/concussion: Denied. History of stroke: Denied. Medical records do suggest a remote potential transient ischemic attack (TIA).  History of seizure activity: Denied. History of known exposure to toxins: Denied. Symptoms of chronic pain: Denied. Experience of frequent headaches/migraines: Endorsed. He reported generally mild to moderate headache symptoms occurring every other day. These are generally able to be managed well with over-the-counter medications.  Frequent instances of dizziness/vertigo: Endorsed. He described sporadic bouts of vertigo and/or increased dizziness and described a previous diagnosis of Meniere's disease. He reported his most recent occurrence being the previous week. However, prior to this, he had gone over two weeks without any reported incidence. Separately, his wife reported that vertigo experiences seem more frequent, sometimes several times throughout the same day. He travels with meclizine and noted that if he is able to take this medication when he senses initial symptoms, it is able to manage these experiences well. The cause for this was unknown and has seemed more pronounced during the past 6-12 months.   Sensory changes: He wears glasses with benefit. He reported the likelihood of some diminished hearing but not to the extent where hearing aids are required. Other sensory changes/difficulties (e.g., taste or smell) were denied.  Balance/coordination difficulties: Balance instability is primarily noted during bouts of dizziness/vertigo. He reported an isolated fall "the other day" but broadly speaking denied typical difficulties with balance or any consistent falling behaviors. He did describe occasionally walking with his hand bracing the wall for added support. One side of the body was not said to be worse or less stable than the other.  Other motor  difficulties: Endorsed. Tremors were said to occur somewhat sporadically, happening several times per week. Experiences impact his upper extremities bilaterally, with one side not being worse than the other. As stated above, it has been previously theorized that these are medication-induced rather than suggestive of a parkinsonian illness. Medications were said to be helpful with symptom management.   Sleep History: Estimated hours obtained each night: 6-7 hours.  Difficulties falling asleep: Endorsed. Difficulties are aided by use of a over-the-counter sleep aid.  Difficulties staying asleep: Denied. Feels rested and refreshed upon awakening: Endorsed "most of the time."  History of snoring: Denied. History of waking up gasping for air: Denied. Witnessed breath cessation while asleep: Denied. Medical records do suggest snoring with a prior diagnosis of mild obstructive sleep apnea. CPAP treatment was initially recommended. When meeting with Dr. Arbutus Gonzales this past November, her notes suggested that Mr. Westcott was no longer using this device regularly.   History of vivid dreaming: Denied. Excessive movement while asleep: Denied. Instances of acting out his dreams: Denied.  Psychiatric/Behavioral Health History: Depression: There is a longstanding history of bipolar I disorder with associated episodes of depressed mood. He described his current mood as "up and down" and acknowledged that he can experience bouts of depression throughout the day or depending on a presenting situation. Current or remote suicidal ideation, intent, or plan was denied.  Anxiety: He described generalized anxious distress, noting that he carries lorazepam with him at all times should these symptoms become elevated. He noted  that he has been using this medication more frequently lately.  Mania: As stated above, there is a longstanding history of bipolar disorder with associated episodes of mania. He described this condition as being  well-managed via medication at the present time.  Trauma History: Denied. Visual/auditory hallucinations: Unclear. He described instances where he will feel as though picture frames on the walls will be slowly moving or shifting to the side. He also described a few isolated instances where he perceived "grub worms" on the floor or in his vicinity when none were actually present. Per his report, the majority of these experiences appear to be associated with symptoms of dizziness and vertigo. However, he was unclear if these experiences could account for all potential visual episodes.  Delusional thoughts: Denied.  Tobacco: Denied. Alcohol: He reported consuming two beers per night with dinner on average. He denied a history of problematic alcohol abuse or dependence.  Recreational drugs: Denied.  Family History: Problem Relation Age of Onset   Aneurysm Mother 75       cerebral   CAD Father 77   Heart disease Sister        Unclear   Healthy Brother    Dementia Other        several individuals on extended paternal side of family   This information was confirmed by Mr. Parker.  Academic/Vocational History: Highest level of educational attainment: 16 years. He graduated from high school and earned a Oncologist in business. He described himself as a good (3.0 GPA) student in academic settings. No relative weaknesses were identified.  History of developmental delay: Denied. History of grade repetition: Denied. Enrollment in special education courses: Denied. History of LD/ADHD: Denied.  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   Evaluation Results:   Behavioral Observations: Mr. Schettler was unaccompanied, arrived to his appointment on time, and was appropriately dressed and  groomed. He appeared alert and oriented. Observed gait and station were within normal limits. Gonzales motor functioning appeared intact upon informal observation and no abnormal movements (e.g., tremors) were noted. His affect was generally relaxed and positive. Spontaneous speech was fluent and word finding difficulties were not observed during the clinical interview. Thought processes were coherent, organized, and normal in content. Insight into his cognitive difficulties appeared adequate.   During testing, sustained attention was appropriate. Task engagement was adequate and he persisted when challenged. Overall, Mr. Warmoth was cooperative with the clinical interview and subsequent testing procedures.   Adequacy of Effort: The validity of neuropsychological testing is limited by the extent to which the individual being tested may be assumed to have exerted adequate effort during testing. Mr. Tehan expressed his intention to perform to the best of his abilities and exhibited adequate task engagement and persistence. Scores across stand-alone and embedded performance validity measures were within expectation. As such, the results of the current evaluation are believed to be a valid representation of Mr. Wisdom's current cognitive functioning.  Test Results: Mr. Fistler was largely oriented at the time of the current evaluation.  Intellectual abilities based upon educational and vocational attainment were estimated to be in the average range. Premorbid abilities were estimated to be within the below average range based upon a single-word reading test.   Processing speed was variable, ranging from the exceptionally low to average normative ranges. Basic  attention was average. More complex attention (e.g., working memory) was well below average. Executive functioning was variable, ranging from the exceptionally low to average normative ranges. Performance was above average across a task assessing safety and  judgment.  Assessed receptive language abilities were average. Likewise, Mr. Cephas did not exhibit any difficulties comprehending task instructions and answered all questions asked of him appropriately. Assessed expressive language was variable. Both phonemic and semantic fluency were well below average to below average while confrontation naming was average.     Assessed visuospatial/visuoconstructional abilities were average to well above average.    Learning (i.e., encoding) of novel verbal and visual information was well below average. Spontaneous delayed recall (i.e., retrieval) of previously learned information was variable, ranging from the well below average to average normative ranges. Retention rates were 97% across a story learning task, 100% across a list learning task, 75% across a daily living task, and 83% across a shape learning task. Performance across recognition tasks was variable, suggesting some evidence for information consolidation.   Results of emotional screening instruments suggested that recent symptoms of generalized anxiety were in the moderate range, while symptoms of depression were also within the moderate range. A screening instrument assessing recent sleep quality suggested the presence of mild sleep dysfunction.  Tables of Scores:   Note: This summary of test scores accompanies the interpretive report and should not be considered in isolation without reference to the appropriate sections in the text. Descriptors are based on appropriate normative data and may be adjusted based on clinical judgment. Terms such as "Within Normal Limits" and "Outside Normal Limits" are used when a more specific description of the test score cannot be determined.       Percentile - Normative Descriptor > 98 - Exceptionally High 91-97 - Well Above Average 75-90 - Above Average 25-74 - Average 9-24 - Below Average 2-8 - Well Below Average < 2 - Exceptionally Low       Orientation:       Raw Score Percentile   NAB Orientation, Form 1 28/29 --- ---       Cognitive Screening:      Raw Score Percentile   SLUMS: 24/30 --- ---       Intellectual Functioning:      Standard Score Percentile   Barona Formula Estimated Premorbid IQ: 117 87 Above Average        Standard Score Percentile   Test of Premorbid Functioning: 84 14 Below Average       Memory:     NAB Memory Module, Form 1: Raw Score (T Score) Percentile   List Learning       Total Trials 1-3 14/36 (30) 2 Well Below Average    List B 2/12 (32) 4 Well Below Average    Short Delay Free Recall 4/12 (31) 3 Well Below Average    Long Delay Free Recall 4/12 (32) 4 Well Below Average    Retention Percentage 100 (51) 54 Average    Recognition Discriminability 7 (47) 38 Average  Shape Learning       Total Trials 1-3 11/27 (35) 7 Well Below Average    Delayed Recall 5/9 (43) 25 Average    Retention Percentage 83 (45) 31 Average    Recognition Discriminability 5 (42) 21 Below Average  Story Learning       Immediate Recall 44/80 (33) 5 Well Below Average    Delayed Recall 28/40 (41) 18 Below Average    Retention Percentage 97 (53)  62 Average  Daily Living Memory       Immediate Recall 29/51 (29) 2 Well Below Average    Delayed Recall 9/17 (28) 2 Well Below Average    Retention Percentage 75 (40) 16 Below Average    Recognition Hits 7/10 (33) 5 Well Below Average       Attention/Executive Function:     Trail Making Test (TMT): Raw Score (T Score) Percentile     Part A 37 secs.,  0 errors (44) 27 Average    Part B 84 secs.,  1 error (46) 34 Average         Scaled Score Percentile   WAIS-IV Coding: 4 2 Well Below Average       NAB Attention Module, Form 1: T Score Percentile     Digits Forward 48 42 Average    Digits Backwards 32 4 Well Below Average       D-KEFS Color-Word Interference Test: Raw Score (Scaled Score) Percentile     Color Naming 47 secs. (4) 2 Well Below Average    Word Reading 39 secs. (3)  1 Exceptionally Low    Inhibition 107 secs. (3) 1 Exceptionally Low      Total Errors 12 errors (1) <1 Exceptionally Low    Inhibition/Switching 96 secs. (7) 16 Below Average      Total Errors 6 errors (7) 16 Below Average       D-KEFS Verbal Fluency Test: Raw Score (Scaled Score) Percentile      Letter Total Correct 22 (6) 9 Below Average    Category Total Correct 25 (6) 9 Below Average    Category Switching Total Correct 10 (7) 16 Below Average    Category Switching Accuracy 9 (8) 25 Average      Total Set Loss Errors 0 (13) 84 Above Average      Total Repetition Errors 0 (13) 84 Above Average       [employer] Test: Raw Score Percentile     Categories (trials) 3 (64) >16 Within Normal Limits    Total Errors 24 16 Below Average    Perseverative Errors 9 27 Average    Non-Perseverative Errors 15 5 Well Below Average    Failure to Maintain Set 0 --- ---       NAB Executive Functions Module, Form 1: T Score Percentile     Judgment 62 88 Above Average       Language:     Verbal Fluency Test: Raw Score (T Score) Percentile     Phonemic Fluency (FAS) 22 (33) 5 Well Below Average    Animal Fluency 13 (31) 3 Well Below Average        NAB Language Module, Form 1: T Score Percentile     Auditory Comprehension 51 54 Average    Naming 31/31 (54) 66 Average       Visuospatial/Visuoconstruction:      Raw Score Percentile   Clock Drawing: 10/10 --- Within Normal Limits       NAB Spatial Module, Form 1: T Score Percentile     Figure Drawing Copy 63 91 Well Above Average        Scaled Score Percentile   WAIS-IV Block Design: 10 50 Average       Mood and Personality:      Raw Score Percentile   Beck Depression Inventory - II: 20 --- Moderate  PROMIS Anxiety Questionnaire: 25 --- Moderate       Additional Questionnaires:  Raw Score Percentile   PROMIS Sleep Disturbance Questionnaire: 26 --- Mild   Informed Consent and Coding/Compliance:   The current evaluation  represents a clinical evaluation for the purposes previously outlined by the referral source and is in no way reflective of a forensic evaluation.   Mr. Akina was provided with a verbal description of the nature and purpose of the present neuropsychological evaluation. Also reviewed were the foreseeable risks and/or discomforts and benefits of the procedure, limits of confidentiality, and mandatory reporting requirements of this provider. The patient was given the opportunity to ask questions and receive answers about the evaluation. Oral consent to participate was provided by the patient.   This evaluation was conducted by Newman Nickels, Ph.D., ABPP-CN, board certified clinical neuropsychologist. Mr. Profitt completed a clinical interview with Dr. Milbert Coulter, billed as one unit [phone], and 140 minutes of cognitive testing and scoring, billed as one unit [phone] and four additional units [id number]. Psychometrist Shan Levans, B.S., assisted Dr. Milbert Coulter with test administration and scoring procedures. As a separate and discrete service, one unit [id number] and two units [phone] were billed for Dr. Tammy Sours time spent in interpretation and report writing.

## 2023-03-28 ENCOUNTER — Encounter: Payer: Self-pay | Admitting: Cardiology

## 2023-03-28 ENCOUNTER — Ambulatory Visit: Payer: 59 | Attending: Cardiology | Admitting: Cardiology

## 2023-03-28 VITALS — BP 100/78 | HR 88 | Ht 70.0 in | Wt 175.0 lb

## 2023-03-28 DIAGNOSIS — R9439 Abnormal result of other cardiovascular function study: Secondary | ICD-10-CM | POA: Diagnosis not present

## 2023-03-28 DIAGNOSIS — I25709 Atherosclerosis of coronary artery bypass graft(s), unspecified, with unspecified angina pectoris: Secondary | ICD-10-CM | POA: Diagnosis not present

## 2023-03-28 DIAGNOSIS — E7800 Pure hypercholesterolemia, unspecified: Secondary | ICD-10-CM

## 2023-03-28 DIAGNOSIS — I10 Essential (primary) hypertension: Secondary | ICD-10-CM | POA: Diagnosis not present

## 2023-03-28 NOTE — Patient Instructions (Signed)

## 2023-03-28 NOTE — Progress Notes (Signed)
Cardiology Office Note:    Date:  03/28/2023   ID:  Jon Gonzales, DOB 19-Feb-1961, MRN [id number]  PCP:  [name], PA-C  Cardiologist:  [name], MD    Referring MD: [name],*   Chief Complaint  Patient presents with   Follow-up  Doing well  History of Present Illness:    Jon Gonzales is a 62 y.o. male    with past medical history significant for premature coronary artery disease.  In 2012 he got coronary bypass graft with 3 vessels LIMA to LAD SVG diagonal SVG to obtuse marginal.  In January 2021 he required drug-eluting stent to native diagonal branch.  He also had history of depression, anxiety, essential hypertension, dyslipidemia.  Also recently he started having some difficulty hearing to some suspicion for Mnire's disease. He presented to my office in May with chief complaint of chest pain.  Since that time he did have a stress test done stress to show ischemia only mild and small area involving inferior wall, prior cardiac catheterization from 2021 show RCA lesion however was only 50%, we gradually try to put him on medication he said since he seen him last time he have to use nitroglycerin only once In about 3 weeks ago he said the episode of chest pain happens every few weeks.  We had a long discussion about what to do with the situation.  I think we have a level that appropriate guideline directed medical therapy need to be maximized. Comes today to my office for follow-up holding very well.  He is cheerful and happy he said he exercised on the regular basis going for a walk and have no difficulty doing it.  Denies have any chest pain tightness squeezing pressure burning chest no palpitation dizziness swelling of lower extremities  Past Medical History:  Diagnosis Date   Abnormal stress test small area of mild ischemia inferior wall 05/24/2022   Abscess of buttock 11/23/2020   Bilateral leg edema 01/29/2020   Bipolar 1 disorder 02/04/2023   Chronic  headaches 01/30/2021   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   Coronary artery disease involving coronary bypass graft of native heart with angina pectoris 05/22/2016   Cath 2013 LAD 95% stenosis, D1 90% stenosis, RCA 20%, Circ 30%. Previous stent to D1 2012. EF 60%.   Dizziness 10/27/2020   Excessive daytime sleepiness 09/11/2016   Gastroesophageal reflux disease 02/04/2023   Generalized anxiety disorder 05/22/2016   Herpes zoster 01/20/2020   History of small bowel obstruction    Admitted 10-14-2007 for partical small bowel obstruction and N.G. tube was placed. Admitted by Dr Dimas Chyle. 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 Small internal hemorrhoids. Otherwise normal colonoscopy to terminal ileum   CORONARY ARTERY BYPASS GRAFT  02/19/2012   L - LAD, SVG - D1, SVG - OM   CORONARY STENT INTERVENTION N/A 11/05/2019   Procedure: CORONARY STENT INTERVENTION;  Surgeon: Runell Gess, MD;  Location: [facility];  Service: Cardiovascular;  Laterality: N/A;  SVG to DIAG    ESOPHAGOGASTRODUODENOSCOPY  01/08/2007   Multiple duodenal ulcers with stenosis/stricture of the distal first portion of the duodenum (measuring approximantely 9mm) Mild gastritis.   LEFT HEART CATH AND CORS/GRAFTS ANGIOGRAPHY N/A 11/05/2019   Procedure: LEFT HEART CATH AND CORS/GRAFTS ANGIOGRAPHY;  Surgeon: Runell Gess, MD;  Location: [facility];  Service: Cardiovascular;  Laterality: N/A;   RENAL ANGIOGRAPHY N/A 11/05/2019   Procedure: RENAL ANGIOGRAPHY;  Surgeon: Runell Gess, MD;  Location: [facility];  Service: Cardiovascular;  Laterality: N/A;   TONSILLECTOMY AND ADENOIDECTOMY     WRIST SURGERY      Current Medications: Current Meds  Medication Sig   aspirin EC 81 MG tablet Take 1 tablet (81 mg total) by mouth daily.   buPROPion (WELLBUTRIN XL) 150 MG 24 hr tablet Take 1 tablet (150 mg total) by mouth daily.   cephALEXin (KEFLEX) 500 MG capsule Take 1 capsule (500 mg total) by mouth 2 (two) times daily.   clopidogrel (PLAVIX) 75 MG tablet Take 1 tablet (75 mg total) by mouth daily.   cyclobenzaprine (FLEXERIL) 10 MG tablet Take 1 tablet (10 mg total) by mouth 3 (three) times daily as needed for muscle spasms.   DULoxetine (CYMBALTA) 60 MG capsule Take 1 capsule (60 mg total) by mouth 2 (two) times daily.   EPINEPHrine (EPIPEN 2-PAK) 0.3 mg/0.3 mL IJ SOAJ injection Inject 0.3 mg into the muscle as needed for anaphylaxis.   Eszopiclone 3 MG TABS Take 1 tablet (3 mg total) by mouth immediately before bedtime as needed for sleep.   ezetimibe (ZETIA) 10 MG tablet Take 1 tablet (10 mg total) by mouth daily.   fexofenadine (ALLEGRA) 60 MG tablet Take 1 tablet (60 mg total) by mouth daily.   hydrochlorothiazide (HYDRODIURIL) 12.5 MG tablet Take 1 tablet (12.5 mg total) by mouth daily.   isosorbide mononitrate (IMDUR) 60 MG 24 hr tablet Take 1 tablet (60 mg total) by mouth daily.   lidocaine (XYLOCAINE) 2 % solution Use as directed 15 mLs in the mouth or throat every  6 (six) hours as needed for mouth pain.   lithium carbonate (LITHOBID) 300 MG ER tablet Take 1 tablet (300 mg total) by mouth 2 (two) times daily.   LORazepam (ATIVAN) 1 MG tablet Take 1 tablet (1 mg total) by mouth every 8 (eight) hours as needed for anxiety.   meclizine (ANTIVERT) 25 MG tablet Take 1 tablet (25 mg total) by mouth 3 (three) times daily as needed for dizziness.   metoprolol succinate (TOPROL-XL) 50 MG 24 hr tablet Take 1 tablet by mouth daily. Take with or immediately following a meal.   Multiple Vitamin (MULTIVITAMIN WITH MINERALS) TABS tablet Take 1 tablet by mouth daily. Men's Multivitamin   nitroGLYCERIN (NITROSTAT) 0.4 MG SL tablet Dissolve 1 tablet (0.4 mg total) under the tongue every 5 (five) minutes up to 3 doses as needed for chest pain. If no relief after 3 doses call 911.   ondansetron (ZOFRAN) 4 MG tablet Take 1 tablet (4 mg total) by mouth every 8 (eight) hours as  needed for nausea or vomiting.   pantoprazole (PROTONIX) 40 MG tablet Take 1 tablet (40 mg total) by mouth daily.   primidone (MYSOLINE) 50 MG tablet Take 1 tablet (50 mg total) by mouth at bedtime. (Patient taking differently: Take 25 mg by mouth at bedtime.)   ranolazine (RANEXA) 1000 MG SR tablet Take 1 tablet by mouth 2 times daily.   rosuvastatin (CRESTOR) 40 MG tablet Take 1 tablet (40 mg total) by mouth daily.   Semaglutide-Weight Management (WEGOVY) 2.4 MG/0.75ML SOAJ Inject 2.4 mg into the skin once a week.     Allergies:   Trintellix [vortioxetine] and Penicillins   Social History   Socioeconomic History   Marital status: Married    Spouse name: Not on file   Number of children: Not on file   Years of education: 16   Highest education level: Bachelor's degree (e.g., BA, AB, BS)  Occupational History   Occupation: Oncologist: [employer]  Tobacco Use   Smoking status: Never   Smokeless tobacco: Never  Vaping Use   Vaping Use: Never used  Substance  and Sexual Activity   Alcohol use: Yes    Alcohol/week: 14.0 standard drinks of alcohol    Types: 14 Cans of beer per week    Comment: ~ 2 beers nightly   Drug use: Never   Sexual activity: Not on file  Other Topics Concern   Not on file  Social History Narrative   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        Right Handed   Lives in two story home    Social Determinants of Health   Financial Resource Strain: Low Risk  (06/04/2022)   Overall Financial Resource Strain (CARDIA)    Difficulty of Paying Living Expenses: Not hard at all  Food Insecurity: No Food Insecurity (06/04/2022)   Hunger Vital Sign    Worried About Running Out of Food in the Last Year: Never true    Ran Out of Food in the Last Year: Never true  Transportation Needs: No Transportation Needs (06/04/2022)   PRAPARE - Administrator, Civil Service (Medical): No    Lack of Transportation (Non-Medical): No  Physical Activity: Inactive (06/04/2022)   Exercise Vital Sign    Days of Exercise per Week: 0 days    Minutes of Exercise per Session: 0 min  Stress: No Stress Concern Present (06/04/2022)   [employer] of Occupational Health - Occupational Stress Questionnaire    Feeling of Stress : Not at all  Social Connections: Moderately Integrated (06/04/2022)   Social Connection and Isolation Panel [NHANES]    Frequency of Communication with Friends and Family: More than three times a week    Frequency of Social Gatherings with Friends and Family: More than three times a week    Attends Religious Services: Never    Database administrator or Organizations: Yes    Attends Engineer, structural: More than 4 times per year    Marital Status: Married     Family History: The patient's family history includes Aneurysm (age of onset: 46) in his mother; CAD (age of onset: 67) in his father; Dementia in an other family member; Healthy in his brother; Heart disease in  his sister. ROS:   Please see the history of present illness.    All 14 point review of systems negative except as described  per history of present illness  EKGs/Labs/Other Studies Reviewed:      Recent Labs: 02/05/2023: ALT 26; BUN 13; Creatinine, Ser 1.15; Hemoglobin 13.7; Platelets 137; Potassium 4.3; Sodium 140; TSH 3.210  Recent Lipid Panel    Component Value Date/Time   CHOL 116 02/05/2023 0819   TRIG 80 02/05/2023 0819   HDL 55 02/05/2023 0819   CHOLHDL 2.1 02/05/2023 0819   LDLCALC 45 02/05/2023 0819    Physical Exam:    VS:  BP 100/78 (BP Location: Left Arm, Patient Position: Sitting)   Pulse 88   Ht 5\' 10"  (1.778 m)   Wt 175 lb (79.4 kg)   SpO2 93%   BMI 25.11 kg/m     Wt Readings from Last 3 Encounters:  03/28/23 175 lb (79.4 kg)  01/31/23 175 lb (79.4 kg)  09/24/22 172 lb (78 kg)     GEN:  Well nourished, well developed in no acute distress HEENT: Normal NECK: No JVD; No carotid bruits LYMPHATICS: No lymphadenopathy CARDIAC: RRR, no murmurs, no rubs, no gallops RESPIRATORY:  Clear to auscultation without rales, wheezing or rhonchi  ABDOMEN: Soft, non-tender, non-distended MUSCULOSKELETAL:  No edema; No deformity  SKIN: Warm and dry LOWER EXTREMITIES: no swelling NEUROLOGIC:  Alert and oriented x 3 PSYCHIATRIC:  Normal affect   ASSESSMENT:    1. Coronary artery disease involving coronary bypass graft of native heart with angina pectoris   2. Primary hypertension   3. Hypercholesterolemia   4. Abnormal stress test small area of mild ischemia inferior wall    PLAN:    In order of problems listed above:  Coronary disease stable from that point review on appropriate guideline directed medical therapy. Dyslipidemia I did review K PN which show me data from February 05, 2023 with LDL 45 HDL 55.  He is on Zetia as well as Crestor 40 which I will continue. Abnormal stress to small area of ischemia but asymptomatic on guideline directed medical therapy.   Elected to continue dual antiplatelets therapy undefinitely.  He does have not have any complication of this therapy   Medication Adjustments/Labs and Tests Ordered: Current medicines are reviewed at length with the patient today.  Concerns regarding medicines are outlined above.  No orders of the defined types were placed in this encounter.  Medication changes: No orders of the defined types were placed in this encounter.   Signed, Georgeanna Lea, MD, [facility] 03/28/2023 8:50 AM    [facility]

## 2023-03-30 ENCOUNTER — Other Ambulatory Visit: Payer: Self-pay

## 2023-03-30 ENCOUNTER — Other Ambulatory Visit (HOSPITAL_COMMUNITY): Payer: Self-pay

## 2023-04-01 ENCOUNTER — Ambulatory Visit: Payer: 59 | Admitting: Psychology

## 2023-04-01 ENCOUNTER — Other Ambulatory Visit: Payer: Self-pay

## 2023-04-01 ENCOUNTER — Other Ambulatory Visit: Payer: Self-pay | Admitting: Physician Assistant

## 2023-04-01 ENCOUNTER — Other Ambulatory Visit (HOSPITAL_COMMUNITY): Payer: Self-pay

## 2023-04-01 DIAGNOSIS — F319 Bipolar disorder, unspecified: Secondary | ICD-10-CM | POA: Diagnosis not present

## 2023-04-01 DIAGNOSIS — R251 Tremor, unspecified: Secondary | ICD-10-CM | POA: Diagnosis not present

## 2023-04-01 DIAGNOSIS — G3184 Mild cognitive impairment, so stated: Secondary | ICD-10-CM | POA: Diagnosis not present

## 2023-04-01 DIAGNOSIS — F411 Generalized anxiety disorder: Secondary | ICD-10-CM

## 2023-04-01 MED ORDER — LORAZEPAM 1 MG PO TABS
1.0000 mg | ORAL_TABLET | Freq: Three times a day (TID) | ORAL | 1 refills | Status: DC | PRN
Start: 2023-04-01 — End: 2023-05-15
  Filled 2023-04-01: qty 90, 30d supply, fill #0

## 2023-04-01 NOTE — Progress Notes (Signed)
   Neuropsychology Feedback Session [name]. [facility] Department of Neurology  Reason for Referral:   Jon Gonzales is a 62 y.o. right-handed Caucasian male referred by [name], D.O., to characterize his current cognitive functioning and assist with diagnostic clarity and treatment planning in the context of subjective cognitive decline, psychiatric comorbidities, and concerns for medication induced tremor experiences.   Feedback:   [name] completed a comprehensive neuropsychological evaluation on 03/21/2023. Please refer to that encounter for the full report and recommendations. Briefly, results suggested primary impairments surrounding complex attention and encoding (i.e., learning) aspects of memory. Verbal fluency represented a relative weakness while performance variability was exhibited across processing speed, executive functioning, and delayed retrieval/consolidation aspects of memory. The etiology for ongoing dysfunction is unclear and potentially mixed as [name] exhibited a fairly nonspecific pattern of impairment. Exhibited testing patterns can be commonly seen in a variety of presentations including ongoing psychiatric distress ([name] has a history of bipolar disorder and reported moderate levels of anxiety and depression during the past 1-2 weeks across questionnaires), sleep dysfunction (he reported mild difficulties over the past 1-2 weeks and it is unclear if he is actively treating obstructive sleep apnea concerns), and medication side effects (especially lorazepam, lithium, and meclizine). Depending on what he is using as an over-the-counter sleep aid, many of these also have anticholinergic properties. There remains a possibility that current difficulties are most directly related to a combination of these factors.   [name] was accompanied by his wife during the current feedback session. Content of the current session focused on the results of his  neuropsychological evaluation. [name] was given the opportunity to ask questions and his questions were answered. He was encouraged to reach out should additional questions arise. A copy of his report was provided at the conclusion of the visit.      One unit [phone] was billed for Dr. Tammy Sours time spent preparing for, conducting, and documenting the current feedback session with Mr. Gemmell.

## 2023-04-30 ENCOUNTER — Other Ambulatory Visit (HOSPITAL_COMMUNITY): Payer: Self-pay

## 2023-04-30 ENCOUNTER — Other Ambulatory Visit: Payer: Self-pay

## 2023-04-30 ENCOUNTER — Other Ambulatory Visit: Payer: Self-pay | Admitting: Physician Assistant

## 2023-04-30 DIAGNOSIS — I2510 Atherosclerotic heart disease of native coronary artery without angina pectoris: Secondary | ICD-10-CM

## 2023-04-30 DIAGNOSIS — Z6827 Body mass index (BMI) 27.0-27.9, adult: Secondary | ICD-10-CM

## 2023-04-30 MED ORDER — WEGOVY 2.4 MG/0.75ML ~~LOC~~ SOAJ
2.4000 mg | SUBCUTANEOUS | 1 refills | Status: DC
  Filled 2023-04-30 – 2023-06-06 (×3): qty 3, 28d supply, fill #0

## 2023-05-01 ENCOUNTER — Other Ambulatory Visit (HOSPITAL_COMMUNITY): Payer: Self-pay

## 2023-05-10 ENCOUNTER — Encounter (HOSPITAL_COMMUNITY): Payer: Self-pay

## 2023-05-10 ENCOUNTER — Other Ambulatory Visit (HOSPITAL_COMMUNITY): Payer: Self-pay

## 2023-05-15 ENCOUNTER — Other Ambulatory Visit (HOSPITAL_COMMUNITY): Payer: Self-pay

## 2023-05-15 ENCOUNTER — Other Ambulatory Visit: Payer: Self-pay | Admitting: Physician Assistant

## 2023-05-15 DIAGNOSIS — F411 Generalized anxiety disorder: Secondary | ICD-10-CM

## 2023-05-15 MED ORDER — LORAZEPAM 1 MG PO TABS
1.0000 mg | ORAL_TABLET | Freq: Three times a day (TID) | ORAL | 0 refills | Status: DC | PRN
Start: 2023-05-15 — End: 2023-07-23
  Filled 2023-05-15: qty 90, 30d supply, fill #0

## 2023-05-15 MED ORDER — PRIMIDONE 50 MG PO TABS
50.0000 mg | ORAL_TABLET | Freq: Two times a day (BID) | ORAL | 0 refills | Status: DC
  Filled 2023-05-15: qty 180, 90d supply, fill #0

## 2023-05-16 ENCOUNTER — Other Ambulatory Visit (HOSPITAL_COMMUNITY): Payer: Self-pay

## 2023-05-25 ENCOUNTER — Other Ambulatory Visit: Payer: Self-pay | Admitting: Physician Assistant

## 2023-05-25 ENCOUNTER — Other Ambulatory Visit (HOSPITAL_COMMUNITY): Payer: Self-pay

## 2023-05-27 ENCOUNTER — Other Ambulatory Visit: Payer: Self-pay

## 2023-05-28 ENCOUNTER — Other Ambulatory Visit (HOSPITAL_COMMUNITY): Payer: Self-pay

## 2023-05-28 MED ORDER — PRIMIDONE 50 MG PO TABS
50.0000 mg | ORAL_TABLET | Freq: Two times a day (BID) | ORAL | 0 refills | Status: DC
  Filled 2023-05-28: qty 180, 90d supply, fill #0

## 2023-05-29 ENCOUNTER — Other Ambulatory Visit (HOSPITAL_COMMUNITY): Payer: Self-pay

## 2023-05-31 ENCOUNTER — Other Ambulatory Visit: Payer: 59

## 2023-05-31 DIAGNOSIS — I10 Essential (primary) hypertension: Secondary | ICD-10-CM | POA: Diagnosis not present

## 2023-05-31 DIAGNOSIS — E291 Testicular hypofunction: Secondary | ICD-10-CM

## 2023-05-31 DIAGNOSIS — R42 Dizziness and giddiness: Secondary | ICD-10-CM | POA: Diagnosis not present

## 2023-06-03 LAB — B12 AND FOLATE PANEL
Folate: >20 ng/mL (ref >3.0)
Vitamin B-12: 786 pg/mL (ref 232–1245)

## 2023-06-03 LAB — CBC WITH DIFFERENTIAL/PLATELET
Basophils Absolute: 0 10*3/uL (ref 0.0–0.2)
Basos: 1 %
EOS (ABSOLUTE): 0.2 10*3/uL (ref 0.0–0.4)
Eos: 4 %
Hematocrit: 43 % (ref 37.5–51.0)
Hemoglobin: 14.3 g/dL (ref 13.0–17.7)
Immature Grans (Abs): 0 10*3/uL (ref 0.0–0.1)
Immature Granulocytes: 0 %
Lymphocytes Absolute: 1.3 10*3/uL (ref 0.7–3.1)
Lymphs: 22 %
MCH: 33.7 pg — ABNORMAL HIGH (ref 26.6–33.0)
MCHC: 33.3 g/dL (ref 31.5–35.7)
MCV: 101 fL — ABNORMAL HIGH (ref 79–97)
Monocytes Absolute: 0.6 10*3/uL (ref 0.1–0.9)
Monocytes: 10 %
Neutrophils Absolute: 3.7 10*3/uL (ref 1.4–7.0)
Neutrophils: 63 %
Platelets: 171 10*3/uL (ref 150–450)
RBC: 4.24 x10E6/uL (ref 4.14–5.80)
RDW: 11.8 % (ref 11.6–15.4)
WBC: 5.9 10*3/uL (ref 3.4–10.8)

## 2023-06-03 LAB — COMPREHENSIVE METABOLIC PANEL
ALT: 28 IU/L (ref 0–44)
AST: 27 IU/L (ref 0–40)
Albumin: 4.7 g/dL (ref 3.9–4.9)
Alkaline Phosphatase: 51 IU/L (ref 44–121)
BUN/Creatinine Ratio: 16 (ref 10–24)
BUN: 22 mg/dL (ref 8–27)
Bilirubin Total: 1 mg/dL (ref 0.0–1.2)
CO2: 24 mmol/L (ref 20–29)
Calcium: 9.3 mg/dL (ref 8.6–10.2)
Chloride: 99 mmol/L (ref 96–106)
Creatinine, Ser: 1.35 mg/dL — ABNORMAL HIGH (ref 0.76–1.27)
Globulin, Total: 2.6 g/dL (ref 1.5–4.5)
Glucose: 96 mg/dL (ref 70–99)
Potassium: 4.1 mmol/L (ref 3.5–5.2)
Sodium: 140 mmol/L (ref 134–144)
Total Protein: 7.3 g/dL (ref 6.0–8.5)
eGFR: 60 mL/min/{1.73_m2} (ref >59)

## 2023-06-03 LAB — T4, FREE: Free T4: 1.26 ng/dL (ref 0.82–1.77)

## 2023-06-03 LAB — VITAMIN D 25 HYDROXY (VIT D DEFICIENCY, FRACTURES): Vit D, 25-Hydroxy: 50.8 ng/mL (ref 30.0–100.0)

## 2023-06-03 LAB — TSH: TSH: 3.86 u[IU]/mL (ref 0.450–4.500)

## 2023-06-03 LAB — METHYLMALONIC ACID, SERUM: Methylmalonic Acid: 188 nmol/L (ref 0–378)

## 2023-06-05 ENCOUNTER — Other Ambulatory Visit: Payer: Self-pay

## 2023-06-06 ENCOUNTER — Other Ambulatory Visit (HOSPITAL_COMMUNITY): Payer: Self-pay

## 2023-06-06 ENCOUNTER — Telehealth: Payer: Self-pay | Admitting: Neurology

## 2023-06-06 NOTE — Telephone Encounter (Signed)
Pts wife had sent following email to Dr. Milbert Coulter via [facility] email, but I wanted it documented properly in pts chart to answer:  You had sent me some resources for geriatric psychiatrists and I apologize but I seem to have misplaced it.   Also, i am very concerned about Jon Gonzales.) His memory is worsening. He forgot one of our best friend's name and it took about 30 minutes into the conversation before someone said it, but he could not recall it on his own. He was very embarrassed. He is forgetting other things also. I feel it is affecting his work also.  I am not sure how to help him. I am going to look over your evaluation in more detail to see what suggestions you had then. Jon has it, so I need to get it to review better.   Do you think it would help to get him another appointment with Dr. Arbutus Leas?   Chelsea, let pt/wife know that neurocog testing 2 months ago did not demonstrate a neurodegen process but if concerned, they can make an appt with Huntley Dec to discuss further and she can evaluate.

## 2023-06-07 NOTE — Telephone Encounter (Signed)
Called pateint and left voicemail  

## 2023-06-10 NOTE — Telephone Encounter (Signed)
Called the pt and got him scheduled on the requested day and time.

## 2023-06-10 NOTE — Telephone Encounter (Signed)
Please call patient on his mobile number and set up an appointment to see Huntley Dec they have specific needs for times

## 2023-06-11 ENCOUNTER — Encounter: Payer: Self-pay | Admitting: Physician Assistant

## 2023-06-11 ENCOUNTER — Ambulatory Visit (INDEPENDENT_AMBULATORY_CARE_PROVIDER_SITE_OTHER): Payer: 59 | Admitting: Physician Assistant

## 2023-06-11 ENCOUNTER — Other Ambulatory Visit: Payer: Self-pay

## 2023-06-11 VITALS — BP 98/62 | HR 73 | Temp 97.2°F | Ht 70.0 in | Wt 176.2 lb

## 2023-06-11 DIAGNOSIS — Z23 Encounter for immunization: Secondary | ICD-10-CM

## 2023-06-11 DIAGNOSIS — R5383 Other fatigue: Secondary | ICD-10-CM

## 2023-06-11 DIAGNOSIS — I10 Essential (primary) hypertension: Secondary | ICD-10-CM | POA: Diagnosis not present

## 2023-06-11 DIAGNOSIS — Z0000 Encounter for general adult medical examination without abnormal findings: Secondary | ICD-10-CM | POA: Diagnosis not present

## 2023-06-11 DIAGNOSIS — Z1211 Encounter for screening for malignant neoplasm of colon: Secondary | ICD-10-CM

## 2023-06-11 LAB — POCT URINALYSIS DIP (CLINITEK)
Bilirubin, UA: NEGATIVE
Blood, UA: NEGATIVE
Glucose, UA: NEGATIVE mg/dL
Leukocytes, UA: NEGATIVE
Nitrite, UA: NEGATIVE
POC PROTEIN,UA: NEGATIVE
Spec Grav, UA: 1.015 (ref 1.010–1.025)
Urobilinogen, UA: 0.2 E.U./dL
pH, UA: 6 (ref 5.0–8.0)

## 2023-06-11 NOTE — Progress Notes (Signed)
Subjective:  Patient ID: Jon Gonzales, male    DOB: [birth year]  Age: 62 y.o. MRN: [id number]  Chief Complaint  Patient presents with   Annual Exam    HPI Well Adult Physical: Patient here for a comprehensive physical exam.The patient reports  has had some issues with depression - is scheduling appt with psychiatrist and also with counselor - stopped wellbutrin but not feeling any different off medication Do you take any herbs or supplements that were not prescribed by a doctor? no Are you taking calcium supplements? no Are you taking aspirin daily? yes  Encounter for general adult medical examination without abnormal findings  Physical ("At Risk" items are starred): Patient's last physical exam was 1 year ago .  Patient is not afflicted from Stress Incontinence and Urge Incontinence  Patient wears a seat belt, has smoke detectors, has carbon monoxide detectors, and wears sunscreen with extended sun exposure. Dental Care: brushes and flosses daily. Last dental visit: 1/24 Vision impairments: wears glasses Ophthalmology/Optometry: overdue Hearing loss: none Last PSA: 8/23 - pt had recent labs so will check at next visit Lab Results  Component Value Date   PSA1 1.0 06/04/2022   PSA1 1.2 08/23/2020   Self Testicular Exam: Yes     06/11/2023    3:30 PM 04/18/2022   10:08 PM 01/17/2022    4:14 PM  Depression screen PHQ 2/9  Decreased Interest 1 3 3   Down, Depressed, Hopeless 2 3 2   PHQ - 2 Score 3 6 5   Altered sleeping 0 3 3  Tired, decreased energy 0 3 1  Change in appetite 0 2 1  Feeling bad or failure about yourself  1 3 3   Trouble concentrating 3 2 2   Moving slowly or fidgety/restless 0 0 0  Suicidal thoughts 2 1 1   PHQ-9 Score 9 20 16   Difficult doing work/chores Somewhat difficult Somewhat difficult          06/04/2022    8:42 AM 08/11/2022    9:58 AM 08/28/2022    1:33 PM 09/18/2022    9:35 AM 06/11/2023    3:30 PM  Fall Risk  Falls in the past year? 0 0 0 0 1   Was there an injury with Fall? 0 0 0 0 0  Fall Risk Category Calculator 0 0 0 0 1  Fall Risk Category (Retired) Low Low Low Low   (RETIRED) Patient Fall Risk Level Low fall risk  Low fall risk Low fall risk   Patient at Risk for Falls Due to No Fall Risks   No Fall Risks No Fall Risks  Fall risk Follow up Falls evaluation completed  Falls evaluation completed Falls evaluation completed Falls evaluation completed             Social Hx   Social History   Socioeconomic History   Marital status: Married    Spouse name: Not on file   Number of children: Not on file   Years of education: 16   Highest education level: Bachelor's degree (e.g., BA, AB, BS)  Occupational History   Occupation: Oncologist: [employer]  Tobacco Use   Smoking status: Never   Smokeless tobacco: Never  Vaping Use   Vaping status: Never Used  Substance and Sexual Activity   Alcohol use: Yes    Alcohol/week: 14.0 standard drinks of alcohol    Types: 14 Cans of beer per week    Comment: ~ 2 beers  nightly   Drug use: Never   Sexual activity: Not on file  Other Topics Concern   Not on file  Social History Narrative   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        Right Handed   Lives in two story home    Social Determinants of Health   Financial Resource Strain: Low Risk  (06/04/2022)   Overall Financial Resource Strain (CARDIA)    Difficulty of Paying Living Expenses: Not hard at all  Food Insecurity: No Food Insecurity (06/04/2022)   Hunger Vital Sign    Worried About Running Out of Food in the Last Year: Never true    Ran Out of Food in the Last Year: Never true  Transportation Needs: No Transportation Needs (06/04/2022)   PRAPARE - Administrator, Civil Service (Medical): No    Lack of Transportation (Non-Medical): No  Physical Activity: Inactive (06/04/2022)   Exercise Vital Sign    Days of Exercise per Week:  0 days    Minutes of Exercise per Session: 0 min  Stress: No Stress Concern Present (06/04/2022)   [employer] of Occupational Health - Occupational Stress Questionnaire    Feeling of Stress : Not at all  Social Connections: Moderately Integrated (06/04/2022)   Social Connection and Isolation Panel [NHANES]    Frequency of Communication with Friends and Family: More than three times a week    Frequency of Social Gatherings with Friends and Family: More than three times a week    Attends Religious Services: Never    Database administrator or Organizations: Yes    Attends Engineer, structural: More than 4 times per year    Marital Status: Married   Past Medical History:  Diagnosis Date   Abnormal stress test small area of mild ischemia inferior wall 05/24/2022   Abscess of buttock 11/23/2020   Bilateral leg edema 01/29/2020   Bipolar 1 disorder 02/04/2023   Chronic headaches 01/30/2021   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   Coronary artery disease involving coronary bypass graft of native heart with angina pectoris 05/22/2016   Cath 2013 LAD 95% stenosis, D1 90% stenosis, RCA 20%, Circ 30%. Previous stent to D1 2012. EF 60%.   Dizziness 10/27/2020   Excessive daytime sleepiness 09/11/2016   Gastroesophageal reflux disease 02/04/2023   Generalized anxiety disorder 05/22/2016   Herpes zoster 01/20/2020   History of small bowel obstruction    Admitted 10-14-2007 for partical small bowel obstruction and N.G. tube was placed. Admitted by Dr Dimas Chyle. 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 Small internal hemorrhoids. Otherwise normal colonoscopy to terminal ileum   CORONARY ARTERY BYPASS GRAFT  02/19/2012   L - LAD, SVG - D1, SVG - OM   CORONARY STENT INTERVENTION N/A 11/05/2019   Procedure: CORONARY STENT INTERVENTION;  Surgeon: Runell Gess, MD;  Location: [facility];  Service: Cardiovascular;  Laterality: N/A;  SVG to DIAG   ESOPHAGOGASTRODUODENOSCOPY  01/08/2007   Multiple duodenal ulcers with stenosis/stricture of the distal first portion of the duodenum (measuring approximantely 9mm) Mild gastritis.   LEFT HEART CATH AND CORS/GRAFTS ANGIOGRAPHY N/A 11/05/2019   Procedure: LEFT HEART CATH AND CORS/GRAFTS ANGIOGRAPHY;  Surgeon: Runell Gess, MD;  Location: [facility];  Service: Cardiovascular;  Laterality: N/A;   RENAL ANGIOGRAPHY N/A 11/05/2019   Procedure: RENAL ANGIOGRAPHY;  Surgeon: Runell Gess, MD;  Location: [facility];  Service: Cardiovascular;  Laterality: N/A;   TONSILLECTOMY AND ADENOIDECTOMY     WRIST SURGERY      Family History  Problem Relation Age of Onset   Aneurysm Mother 33       cerebral   CAD Father 53   Heart disease Sister        Unclear   Healthy Brother    Dementia Other        several individuals on extended paternal side of family    ROS CONSTITUTIONAL: Negative for chills, fatigue, fever, unintentional weight gain and unintentional weight loss.  E/N/T: Negative for ear pain, nasal  congestion and sore throat.  CARDIOVASCULAR: Negative for chest pain, dizziness, palpitations and pedal edema.  RESPIRATORY: Negative for recent cough and dyspnea.  GASTROINTESTINAL: Negative for abdominal pain, acid reflux symptoms, constipation, diarrhea, nausea and vomiting.  MSK: Negative for arthralgias and myalgias.  INTEGUMENTARY: Negative for rash.  NEUROLOGICAL: Negative for dizziness and headaches.  PSYCHIATRIC: Negative for sleep disturbance and to question depression screen.  Negative for depression, negative for anhedonia.     06/11/2023    3:30 PM 04/18/2022   10:08 PM 01/17/2022    4:14 PM  Depression screen PHQ 2/9  Decreased Interest 1 3 3   Down, Depressed, Hopeless 2 3 2   PHQ - 2 Score 3 6 5   Altered sleeping 0 3 3  Tired, decreased energy 0 3 1  Change in appetite 0 2 1  Feeling bad or failure about yourself  1 3 3   Trouble concentrating 3 2 2   Moving slowly or fidgety/restless 0 0 0  Suicidal thoughts 2 1 1   PHQ-9 Score 9 20 16   Difficult doing work/chores Somewhat difficult Somewhat difficult     Objective:  PHYSICAL EXAM:   BP 98/62 (BP Location: Right Arm, Patient Position: Sitting, Cuff Size: Large)   Pulse 73   Temp (!) 97.2 F (36.2 C) (Temporal)   Ht 5\' 10"  (1.778 m)   Wt 176 lb 3.2 oz (79.9 kg)   SpO2 98%   BMI 25.28 kg/m  BP Readings from Last 3 Encounters:  06/11/23 98/62  03/28/23 100/78  01/31/23 110/60      GEN: Well nourished, well developed, in no acute distress  HEENT: normal external ears and nose - normal external auditory canals and TMS - hearing grossly normal -  - Lips, Teeth and Gums - normal  Oropharynx - normal mucosa, palate, and posterior pharynx Neck: no JVD or masses - no thyromegaly- no carotid bruits Cardiac: RRR; no murmurs, rubs, or gallops,no edema - no significant varicosities Respiratory:  normal respiratory rate and pattern with no distress - normal breath sounds with no rales, rhonchi, wheezes or rubs GI: normal  bowel sounds, no masses or tenderness MS: no deformity or atrophy  Skin: warm and dry, no rash  Neuro:  Alert and Oriented x 3,- CN II-Xii grossly intact Psych: euthymic mood, appropriate affect and demeanor   Office Visit on 06/11/2023  Component Date Value Ref Range Status   Color, UA 06/11/2023 yellow  yellow Final   Clarity, UA 06/11/2023 clear  clear Final   Glucose, UA 06/11/2023 negative  negative mg/dL Final   Bilirubin, UA 28/41/3244 negative  negative Final   Ketones, POC UA 06/11/2023 trace (5) (A)  negative mg/dL Final   Spec Grav, UA 10/17/7251 1.015  1.010 - 1.025 Final   Blood, UA 06/11/2023 negative  negative Final   pH, UA 06/11/2023 6.0  5.0 - 8.0 Final   POC PROTEIN,UA 06/11/2023 negative  negative, trace Final   Urobilinogen, UA 06/11/2023 0.2  0.2 or 1.0 E.U./dL Final   Nitrite, UA 66/44/0347 Negative  Negative Final   Leukocytes, UA 06/11/2023 Negative  Negative Final     Assessment & Plan:  Annual physical exam -     POCT URINALYSIS DIP (CLINITEK)  Primary hypertension -     POCT URINALYSIS DIP (CLINITEK)  Colon cancer screening -     Ambulatory referral to Gastroenterology  Need for diphtheria-tetanus-pertussis (Tdap) vaccine -     Tdap vaccine greater than or equal to 7yo IM    This is a list of the screening recommended for you and due dates:  Health Maintenance  Topic Date Due   DTaP/Tdap/Td vaccine (1 - Tdap) Never done   Colon Cancer Screening  09/10/2022   COVID-19 Vaccine (5 - 2023-24 season) 06/27/2023*   Flu Shot  01/13/2024*   Zoster (Shingles) Vaccine  Completed   HPV Vaccine  Aged Out  *Topic was postponed. The date shown is not the original due date.     Follow-up: Return for fasting chronic visit October.  An After Visit Summary was printed and given to the patient.  Jettie Pagan [facility] [phone]

## 2023-06-12 ENCOUNTER — Emergency Department (HOSPITAL_COMMUNITY)
Admission: EM | Admit: 2023-06-12 | Discharge: 2023-06-12 | Disposition: A | Discharge status: Home / Self Care | Payer: 59 | Source: Home / Self Care | Attending: Emergency Medicine | Admitting: Emergency Medicine

## 2023-06-12 ENCOUNTER — Ambulatory Visit (INDEPENDENT_AMBULATORY_CARE_PROVIDER_SITE_OTHER): Payer: 59 | Admitting: Physician Assistant

## 2023-06-12 ENCOUNTER — Other Ambulatory Visit: Payer: Self-pay

## 2023-06-12 ENCOUNTER — Emergency Department (HOSPITAL_COMMUNITY): Payer: 59

## 2023-06-12 ENCOUNTER — Encounter: Payer: Self-pay | Admitting: Physician Assistant

## 2023-06-12 ENCOUNTER — Encounter (HOSPITAL_COMMUNITY): Payer: Self-pay

## 2023-06-12 VITALS — BP 90/60 | HR 82 | Temp 97.7°F

## 2023-06-12 DIAGNOSIS — Z7901 Long term (current) use of anticoagulants: Secondary | ICD-10-CM | POA: Insufficient documentation

## 2023-06-12 DIAGNOSIS — R4781 Slurred speech: Secondary | ICD-10-CM | POA: Diagnosis not present

## 2023-06-12 DIAGNOSIS — R42 Dizziness and giddiness: Secondary | ICD-10-CM | POA: Insufficient documentation

## 2023-06-12 DIAGNOSIS — R9431 Abnormal electrocardiogram [ECG] [EKG]: Secondary | ICD-10-CM

## 2023-06-12 DIAGNOSIS — R5383 Other fatigue: Secondary | ICD-10-CM

## 2023-06-12 DIAGNOSIS — R410 Disorientation, unspecified: Secondary | ICD-10-CM | POA: Diagnosis not present

## 2023-06-12 DIAGNOSIS — R29818 Other symptoms and signs involving the nervous system: Secondary | ICD-10-CM | POA: Diagnosis not present

## 2023-06-12 DIAGNOSIS — R531 Weakness: Secondary | ICD-10-CM | POA: Diagnosis not present

## 2023-06-12 DIAGNOSIS — R400 Somnolence: Secondary | ICD-10-CM | POA: Diagnosis not present

## 2023-06-12 DIAGNOSIS — R4701 Aphasia: Secondary | ICD-10-CM

## 2023-06-12 DIAGNOSIS — Z0000 Encounter for general adult medical examination without abnormal findings: Secondary | ICD-10-CM | POA: Diagnosis not present

## 2023-06-12 DIAGNOSIS — R55 Syncope and collapse: Secondary | ICD-10-CM | POA: Diagnosis not present

## 2023-06-12 DIAGNOSIS — Z7982 Long term (current) use of aspirin: Secondary | ICD-10-CM | POA: Insufficient documentation

## 2023-06-12 LAB — TSH: TSH: 0.65 u[IU]/mL (ref 0.350–4.500)

## 2023-06-12 LAB — GLUCOSE, POCT (MANUAL RESULT ENTRY): POC Glucose: 115 mg/dl — AB (ref 70–99)

## 2023-06-12 LAB — URINALYSIS, ROUTINE W REFLEX MICROSCOPIC
Bilirubin Urine: NEGATIVE
Glucose, UA: NEGATIVE mg/dL
Hgb urine dipstick: NEGATIVE
Ketones, ur: NEGATIVE mg/dL
Leukocytes,Ua: NEGATIVE
Nitrite: NEGATIVE
Protein, ur: NEGATIVE mg/dL
Specific Gravity, Urine: 1.005 (ref 1.005–1.030)
pH: 6 (ref 5.0–8.0)

## 2023-06-12 LAB — COMPREHENSIVE METABOLIC PANEL
ALT: 31 U/L (ref 0–44)
AST: 31 U/L (ref 15–41)
Albumin: 3.2 g/dL — ABNORMAL LOW (ref 3.5–5.0)
Alkaline Phosphatase: 37 U/L — ABNORMAL LOW (ref 38–126)
Anion gap: 9 (ref 5–15)
BUN: 15 mg/dL (ref 8–23)
CO2: 24 mmol/L (ref 22–32)
Calcium: 7.9 mg/dL — ABNORMAL LOW (ref 8.9–10.3)
Chloride: 101 mmol/L (ref 98–111)
Creatinine, Ser: 1.17 mg/dL (ref 0.61–1.24)
GFR, Estimated: >60 mL/min (ref >60)
Glucose, Bld: 103 mg/dL — ABNORMAL HIGH (ref 70–99)
Potassium: 3.7 mmol/L (ref 3.5–5.1)
Sodium: 134 mmol/L — ABNORMAL LOW (ref 135–145)
Total Bilirubin: 0.7 mg/dL (ref 0.3–1.2)
Total Protein: 6 g/dL — ABNORMAL LOW (ref 6.5–8.1)

## 2023-06-12 LAB — I-STAT CHEM 8, ED
BUN: 16 mg/dL (ref 8–23)
Calcium, Ion: 0.98 mmol/L — ABNORMAL LOW (ref 1.15–1.40)
Chloride: 105 mmol/L (ref 98–111)
Creatinine, Ser: 1 mg/dL (ref 0.61–1.24)
Glucose, Bld: 101 mg/dL — ABNORMAL HIGH (ref 70–99)
HCT: 31 % — ABNORMAL LOW (ref 39.0–52.0)
Hemoglobin: 10.5 g/dL — ABNORMAL LOW (ref 13.0–17.0)
Potassium: 3.5 mmol/L (ref 3.5–5.1)
Sodium: 138 mmol/L (ref 135–145)
TCO2: 22 mmol/L (ref 22–32)

## 2023-06-12 LAB — RAPID URINE DRUG SCREEN, HOSP PERFORMED
Amphetamines: NOT DETECTED
Barbiturates: NOT DETECTED
Benzodiazepines: NOT DETECTED
Cocaine: NOT DETECTED
Opiates: NOT DETECTED
Tetrahydrocannabinol: NOT DETECTED

## 2023-06-12 LAB — CBC
HCT: 37.1 % — ABNORMAL LOW (ref 39.0–52.0)
Hemoglobin: 12.9 g/dL — ABNORMAL LOW (ref 13.0–17.0)
MCH: 34.9 pg — ABNORMAL HIGH (ref 26.0–34.0)
MCHC: 34.8 g/dL (ref 30.0–36.0)
MCV: 100.3 fL — ABNORMAL HIGH (ref 80.0–100.0)
Platelets: 121 10*3/uL — ABNORMAL LOW (ref 150–400)
RBC: 3.7 MIL/uL — ABNORMAL LOW (ref 4.22–5.81)
RDW: 11.8 % (ref 11.5–15.5)
WBC: 4.1 10*3/uL (ref 4.0–10.5)
nRBC: 0 % (ref 0.0–0.2)

## 2023-06-12 LAB — DIFFERENTIAL
Abs Immature Granulocytes: 0.02 10*3/uL (ref 0.00–0.07)
Basophils Absolute: 0 10*3/uL (ref 0.0–0.1)
Basophils Relative: 1 %
Eosinophils Absolute: 0.2 10*3/uL (ref 0.0–0.5)
Eosinophils Relative: 4 %
Immature Granulocytes: 1 %
Lymphocytes Relative: 24 %
Lymphs Abs: 1 10*3/uL (ref 0.7–4.0)
Monocytes Absolute: 0.5 10*3/uL (ref 0.1–1.0)
Monocytes Relative: 12 %
Neutro Abs: 2.4 10*3/uL (ref 1.7–7.7)
Neutrophils Relative %: 58 %

## 2023-06-12 LAB — APTT: aPTT: 27 seconds (ref 24–36)

## 2023-06-12 LAB — ETHANOL: Alcohol, Ethyl (B): <10 mg/dL (ref <10)

## 2023-06-12 LAB — PROTIME-INR
INR: 1.1 (ref 0.8–1.2)
Prothrombin Time: 14.2 seconds (ref 11.4–15.2)

## 2023-06-12 LAB — T4, FREE: Free T4: 0.91 ng/dL (ref 0.61–1.12)

## 2023-06-12 MED ORDER — SODIUM CHLORIDE 0.9 % IV BOLUS
1000.0000 mL | Freq: Once | INTRAVENOUS | Status: AC
  Administered 2023-06-12: 1000 mL via INTRAVENOUS

## 2023-06-12 MED ORDER — MECLIZINE HCL 25 MG PO TABS
25.0000 mg | ORAL_TABLET | Freq: Once | ORAL | Status: AC
  Administered 2023-06-12: 25 mg via ORAL
  Filled 2023-06-12: qty 1

## 2023-06-12 NOTE — Progress Notes (Signed)
Responded to page. Escorted wife to bedside to visit with husband. Chaplain provided emotional and spiritual support. Chaplain available as needed.  Venida Jarvis, [place], [facility], Pager [phone]

## 2023-06-12 NOTE — ED Notes (Signed)
Patient transported to MRI 

## 2023-06-12 NOTE — ED Triage Notes (Signed)
Pt bib ems from doctor's office; this am daughter reports pt lethargic; sent to rule out stroke; family endorses some confusion; ems reports slurred speech baseline, hx meniere's disease; states pt normally has dizziness associated with meniere's but today is worse; room spinning; pt states went to bed normal, woke around 0630 this am with the increased dizziness; 20 lac, 200 ns given pta; 110/90, 77 hr, 98% RA, cbg 115, 97.57f; 12 lead unremarkable

## 2023-06-12 NOTE — Discharge Instructions (Signed)
You were seen in the emergency room for evaluation of dizziness and concern for possible stroke.  Your MRI was reassuringly negative today but your exam did show some difficulty with finger-nose testing.  Given significant stroke-volume in the ER today, we were unable to facilitate a timely in person neurology consultation but you are already on aspirin, Plavix and statin which should be overall neuroprotective.  We have shared decision making in the ER and decided for discharge from the ER today with urgent outpatient neurology follow-up.  There is important that if you have worsening symptoms including difficulty walking, difficulty moving your arms, facial droop, speech difficulty or any other concerning symptoms that you return to the emergency department immediately.  Please call Dr. Don Perking office tomorrow morning to establish urgent follow-up.

## 2023-06-12 NOTE — ED Provider Notes (Signed)
[facility] Gonzales EMERGENCY DEPARTMENT AT [facility] Provider Note   CSN: [id number] Arrival date & time: 06/12/23  1004     History  Chief Complaint  Patient presents with   Dizziness    Jon Gonzales is a 62 y.o. male.  62 yo M with a chief complaints of feeling very sleepy.  This been going on for a few days now.  He woke up this morning and felt dizzy which is not abnormal for him with his history of Mnire's disease but things got progressively worse throughout the day.  Was noted to have slurred speech and eventually was sent here via EMS for evaluation.  Denies headache or neck pain.   Dizziness      Home Medications Prior to Admission medications   Medication Sig Start Date End Date Taking? Authorizing Provider  aspirin EC 81 MG tablet Take 1 tablet (81 mg total) by mouth daily. 11/05/19   Runell Gess, MD  clopidogrel (PLAVIX) 75 MG tablet Take 1 tablet (75 mg total) by mouth daily. 10/03/22   [name], MD  cyclobenzaprine (FLEXERIL) 10 MG tablet Take 1 tablet (10 mg total) by mouth 3 (three) times daily as needed for muscle spasms. 10/03/22   [name], MD  DULoxetine (CYMBALTA) 60 MG capsule Take 1 capsule (60 mg total) by mouth 2 (two) times daily. 10/03/22   [name], MD  EPINEPHrine (EPIPEN 2-PAK) 0.3 mg/0.3 mL IJ SOAJ injection Inject 0.3 mg into the muscle as needed for anaphylaxis. 09/22/22   [name], MD  Eszopiclone 3 MG TABS Take 1 tablet (3 mg total) by mouth immediately before bedtime as needed for sleep. 10/03/22 04/14/23  [name], MD  ezetimibe (ZETIA) 10 MG tablet Take 1 tablet (10 mg total) by mouth daily. 10/03/22   [name], MD  fexofenadine (ALLEGRA) 60 MG tablet Take 1 tablet (60 mg total) by mouth daily. 10/03/22   [name], MD  hydrochlorothiazide (HYDRODIURIL) 12.5 MG tablet Take 1 tablet (12.5 mg total) by mouth daily. 10/03/22   [name], MD  isosorbide mononitrate (IMDUR) 60 MG 24 hr tablet Take 1 tablet (60 mg total) by mouth daily. 10/03/22   [name], MD  lidocaine (XYLOCAINE) 2 % solution Use as directed 15 mLs in the mouth or throat every 6 (six) hours as needed for mouth pain. 10/03/22   [name], MD  lithium carbonate (LITHOBID) 300 MG ER tablet Take 1 tablet (300 mg total) by mouth 2 (two) times daily. 10/03/22   [name], MD  LORazepam (ATIVAN) 1 MG tablet Take 1 tablet (1 mg total) by mouth every 8 (eight) hours as needed for anxiety. 05/15/23   [name], PA-C  meclizine (ANTIVERT) 25 MG tablet Take 1 tablet (25 mg total) by mouth 3 (three) times daily as needed for dizziness. 10/03/22   [name], MD  metoprolol succinate (TOPROL-XL) 50 MG 24 hr tablet Take 1 tablet by mouth daily. Take with or immediately following a meal. 10/03/22   [name], MD  Multiple Vitamin (MULTIVITAMIN WITH MINERALS) TABS tablet Take 1 tablet by mouth daily. Men's Multivitamin    [provider]  nitroGLYCERIN (NITROSTAT) 0.4 MG SL tablet Dissolve 1 tablet (0.4 mg total) under the tongue every 5 (five) minutes up to 3 doses as needed for chest pain. If no relief after 3 doses call 911. 09/22/22   [name],  MD  ondansetron (ZOFRAN) 4 MG tablet Take 1 tablet (4 mg total) by mouth every 8 (eight) hours as needed for nausea or vomiting. 10/13/22   Janie Morning, NP  pantoprazole (PROTONIX) 40 MG tablet Take 1 tablet (40 mg total) by mouth daily. 10/03/22   [name], MD  primidone (MYSOLINE) 50 MG tablet Take 1 tablet (50 mg total) by mouth in the morning and at bedtime. 05/28/23   Sirivol, Kristie Cowman, MD  ranolazine (RANEXA) 1000 MG SR tablet Take 1 tablet by mouth 2 times daily. 10/03/22   [name], MD  rosuvastatin (CRESTOR) 40 MG tablet Take 1 tablet (40 mg total) by mouth daily. 10/03/22   [name],  MD  diltiazem (CARDIZEM SR) 60 MG 12 hr capsule Take 1 capsule (60 mg total) by mouth 2 (two) times daily. 05/02/20 11/07/20  [name], MD  pantoprazole (PROTONIX) 40 MG tablet Take 1 tablet (40 mg total) by mouth daily. 01/17/22   Janie Morning, NP  triamterene-hydrochlorothiazide (DYAZIDE) 37.5-25 MG capsule Take 1 each (1 capsule total) by mouth daily. 08/08/20 10/25/20  [name], MD      Allergies    Trintellix [vortioxetine] and Penicillins    Review of Systems   Review of Systems  Neurological:  Positive for dizziness.    Physical Exam Updated Vital Signs BP 118/80   Pulse 67   Temp 97.6 F (36.4 C)   Resp 16   Ht 5\' 10"  (1.778 m)   Wt 75.3 kg   SpO2 99%   BMI 23.82 kg/m  Physical Exam Vitals and nursing note reviewed.  Constitutional:      Appearance: He is well-developed.  HENT:     Head: Normocephalic and atraumatic.  Eyes:     Pupils: Pupils are equal, round, and reactive to light.  Neck:     Vascular: No JVD.  Cardiovascular:     Rate and Rhythm: Normal rate and regular rhythm.     Heart sounds: No murmur heard.    No friction rub. No gallop.  Pulmonary:     Effort: No respiratory distress.     Breath sounds: No wheezing.  Abdominal:     General: There is no distension.     Tenderness: There is no abdominal tenderness. There is no guarding or rebound.  Musculoskeletal:        General: Normal range of motion.     Cervical back: Normal range of motion and neck supple.  Skin:    Coloration: Skin is not pale.     Findings: No rash.  Neurological:     Mental Status: He is alert and oriented to person, place, and time.     Comments: Patient is wavering finger-to-nose with the right upper extremity and has trouble touching his nose or my finger.  Otherwise I do not notice any obvious neurologic deficit with the exception of slurred speech.  Psychiatric:        Behavior: Behavior normal.     ED Results / Procedures / Treatments    Labs (all labs ordered are listed, but only abnormal results are displayed) Labs Reviewed  CBC - Abnormal; Notable for the following components:      Result Value   RBC 3.70 (*)    Hemoglobin 12.9 (*)    HCT 37.1 (*)    MCV 100.3 (*)    MCH 34.9 (*)    Platelets 121 (*)    All other components within  normal limits  COMPREHENSIVE METABOLIC PANEL - Abnormal; Notable for the following components:   Sodium 134 (*)    Glucose, Bld 103 (*)    Calcium 7.9 (*)    Total Protein 6.0 (*)    Albumin 3.2 (*)    Alkaline Phosphatase 37 (*)    All other components within normal limits  I-STAT CHEM 8, ED - Abnormal; Notable for the following components:   Glucose, Bld 101 (*)    Calcium, Ion 0.98 (*)    Hemoglobin 10.5 (*)    HCT 31.0 (*)    All other components within normal limits  ETHANOL  PROTIME-INR  APTT  DIFFERENTIAL  RAPID URINE DRUG SCREEN, HOSP PERFORMED  URINALYSIS, ROUTINE W REFLEX MICROSCOPIC  T4, FREE  TSH    EKG EKG Interpretation Date/Time:  Wednesday June 12 2023 10:12:40 EDT Ventricular Rate:  72 PR Interval:  168 QRS Duration:  119 QT Interval:  432 QTC Calculation: 473 R Axis:   -5  Text Interpretation: Age not entered, assumed to be  62 years old for purpose of ECG interpretation Sinus rhythm Probable left atrial enlargement Nonspecific intraventricular conduction delay flipped t waves anteriorly Otherwise no significant change Confirmed by Melene Plan [phone]) on 06/12/2023 11:01:03 AM  Radiology MR BRAIN WO CONTRAST  Result Date: 06/12/2023 CLINICAL DATA:  Lethargy, concern for stroke. Some slurred speech and confusion. Worsened dizziness. EXAM: MRI HEAD WITHOUT CONTRAST TECHNIQUE: Multiplanar, multiecho pulse sequences of the brain and surrounding structures were obtained without intravenous contrast. COMPARISON:  Brain MRI 06/24/2022 FINDINGS: Brain: There is no acute intracranial hemorrhage, extra-axial fluid collection, or acute infarct. Parenchymal  volume is normal. The ventricles are normal in size. There is a punctate focus of nonspecific FLAIR signal abnormality in the left frontal subcortical white matter, unchanged. Parenchymal signal is otherwise normal. The pituitary and suprasellar region are normal. There is no mass lesion there is no mass effect or midline shift. Vascular: Normal flow voids. Skull and upper cervical spine: Normal marrow signal. Sinuses/Orbits: There is moderate mucosal thickening throughout the paranasal sinuses. The globes and orbits are unremarkable. Other: The mastoid air cells and middle ear cavities are clear. IMPRESSION: 1. Unremarkable appearance of the brain with no acute intracranial pathology. 2. Moderate mucosal thickening throughout the paranasal sinuses. Electronically Signed   By: Lesia Hausen M.D.   On: 06/12/2023 12:00    Procedures Procedures    Medications Ordered in ED Medications  meclizine (ANTIVERT) tablet 25 mg (25 mg Oral Given 06/12/23 1055)  sodium chloride 0.9 % bolus 1,000 mL (1,000 mLs Intravenous New Bag/Given 06/12/23 1412)    ED Course/ Medical Decision Making/ A&P                                 Medical Decision Making Amount and/or Complexity of Data Reviewed Labs: ordered. Radiology: ordered.   63 yo M with a cc of not feeling well. Patient woke up this morning not feeling well.  Feels very tired.  His wife is a physician who provides some further information.  States that she recently had stopped his Wellbutrin this week to see if that would help him with his depression.  Patient finger-to-nose is significantly off.  Especially with the right upper extremity.  MRI of the brain is negative.  I discussed this with neurology, Dr. Derry Lory, will evaluate the patient at bedside.  The patients results and plan were reviewed and discussed.  Any x-rays performed were independently reviewed by myself.   Differential diagnosis were considered with the presenting  HPI.  Medications  meclizine (ANTIVERT) tablet 25 mg (25 mg Oral Given 06/12/23 1055)  sodium chloride 0.9 % bolus 1,000 mL (1,000 mLs Intravenous New Bag/Given 06/12/23 1412)    Vitals:   06/12/23 1021 06/12/23 1300 06/12/23 1330 06/12/23 1400  BP:  (!) 143/93 100/72 118/80  Pulse:  65 69 67  Resp:  16  16  Temp:    97.6 F (36.4 C)  TempSrc:      SpO2:  99% 100% 99%  Weight: 75.3 kg     Height: 5\' 10"  (1.778 m)       Final diagnoses:  Dizziness  Sleepiness           Final Clinical Impression(s) / ED Diagnoses Final diagnoses:  Dizziness  Sleepiness    Rx / DC Orders ED Discharge Orders     None         Melene Plan, DO 06/12/23 1515

## 2023-06-12 NOTE — ED Notes (Signed)
Dr. Floyd at bedside. 

## 2023-06-13 ENCOUNTER — Encounter: Payer: Self-pay | Admitting: Physician Assistant

## 2023-06-13 ENCOUNTER — Other Ambulatory Visit: Payer: Self-pay | Admitting: Physician Assistant

## 2023-06-13 ENCOUNTER — Other Ambulatory Visit (HOSPITAL_COMMUNITY): Payer: Self-pay

## 2023-06-13 DIAGNOSIS — R9431 Abnormal electrocardiogram [ECG] [EKG]: Secondary | ICD-10-CM

## 2023-06-13 DIAGNOSIS — R4701 Aphasia: Secondary | ICD-10-CM | POA: Insufficient documentation

## 2023-06-13 DIAGNOSIS — R899 Unspecified abnormal finding in specimens from other organs, systems and tissues: Secondary | ICD-10-CM

## 2023-06-13 DIAGNOSIS — R5383 Other fatigue: Secondary | ICD-10-CM | POA: Insufficient documentation

## 2023-06-13 DIAGNOSIS — R55 Syncope and collapse: Secondary | ICD-10-CM | POA: Insufficient documentation

## 2023-06-13 DIAGNOSIS — R29818 Other symptoms and signs involving the nervous system: Secondary | ICD-10-CM

## 2023-06-13 DIAGNOSIS — H814 Vertigo of central origin: Secondary | ICD-10-CM

## 2023-06-13 HISTORY — DX: Abnormal electrocardiogram (ECG) (EKG): R94.31

## 2023-06-13 HISTORY — DX: Syncope and collapse: R55

## 2023-06-13 HISTORY — DX: Aphasia: R47.01

## 2023-06-13 HISTORY — DX: Other symptoms and signs involving the nervous system: R29.818

## 2023-06-13 LAB — CBC WITH DIFFERENTIAL/PLATELET
Basophils Absolute: 0 10*3/uL (ref 0.0–0.2)
Basos: 0 %
EOS (ABSOLUTE): 0.1 10*3/uL (ref 0.0–0.4)
Eos: 3 %
Hematocrit: 40.8 % (ref 37.5–51.0)
Hemoglobin: 13.8 g/dL (ref 13.0–17.7)
Lymphocytes Absolute: 1.4 10*3/uL (ref 0.7–3.1)
Lymphs: 29 %
MCH: 34 pg — ABNORMAL HIGH (ref 26.6–33.0)
MCHC: 33.8 g/dL (ref 31.5–35.7)
MCV: 101 fL — ABNORMAL HIGH (ref 79–97)
Monocytes Absolute: 0.4 10*3/uL (ref 0.1–0.9)
Monocytes: 9 %
Neutrophils Absolute: 2.7 10*3/uL (ref 1.4–7.0)
Neutrophils: 59 %
Platelets: 139 10*3/uL — ABNORMAL LOW (ref 150–450)
RBC: 4.06 x10E6/uL — ABNORMAL LOW (ref 4.14–5.80)
RDW: 13.4 % (ref 11.6–15.4)
WBC: 4.6 10*3/uL (ref 3.4–10.8)

## 2023-06-13 LAB — COMPREHENSIVE METABOLIC PANEL
ALT: 26 IU/L (ref 0–44)
AST: 25 IU/L (ref 0–40)
Albumin: 4.2 g/dL (ref 3.9–4.9)
Alkaline Phosphatase: 54 IU/L (ref 44–121)
BUN/Creatinine Ratio: 10 (ref 10–24)
BUN: 12 mg/dL (ref 8–27)
Bilirubin Total: 0.4 mg/dL (ref 0.0–1.2)
CO2: 24 mmol/L (ref 20–29)
Calcium: 8.8 mg/dL (ref 8.6–10.2)
Chloride: 104 mmol/L (ref 96–106)
Creatinine, Ser: 1.24 mg/dL (ref 0.76–1.27)
Globulin, Total: 2.3 g/dL (ref 1.5–4.5)
Glucose: 102 mg/dL — ABNORMAL HIGH (ref 70–99)
Potassium: 3.8 mmol/L (ref 3.5–5.2)
Sodium: 139 mmol/L (ref 134–144)
Total Protein: 6.5 g/dL (ref 6.0–8.5)
eGFR: 66 mL/min/{1.73_m2} (ref >59)

## 2023-06-13 MED ORDER — AZITHROMYCIN 250 MG PO TABS: ORAL_TABLET | ORAL | 0 refills | Status: AC

## 2023-06-13 NOTE — ED Provider Notes (Signed)
  Physical Exam  BP 118/80   Pulse 67   Temp 97.6 F (36.4 C)   Resp 16   Ht 5\' 10"  (1.778 m)   Wt 75.3 kg   SpO2 99%   BMI 23.82 kg/m   Physical Exam Vitals and nursing note reviewed.  Constitutional:      General: He is not in acute distress.    Appearance: He is well-developed.  HENT:     Head: Normocephalic and atraumatic.  Eyes:     Conjunctiva/sclera: Conjunctivae normal.  Cardiovascular:     Rate and Rhythm: Normal rate and regular rhythm.     Heart sounds: No murmur heard. Pulmonary:     Effort: Pulmonary effort is normal. No respiratory distress.  Musculoskeletal:        General: No swelling.     Cervical back: Neck supple.  Skin:    General: Skin is warm and dry.  Neurological:     Mental Status: He is alert.     Comments: Difficulty with finger-to-nose  Psychiatric:        Mood and Affect: Mood normal.     Procedures  Procedures  ED Course / MDM    Medical Decision Making Amount and/or Complexity of Data Reviewed Labs: ordered. Radiology: ordered.   Patient received in handoff.  Dysdiadochokinesia and difficulty with finger-to-nose testing but with negative MRI.  Pending neurology consultation.  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  Stated it would be multiple hours until they could see the patient at bedside.  I discussed the timing with the patient and his wife at bedside and they are requesting closer outpatient follow-up with [facility] neurology as they have an appointment scheduled in 2 weeks.  I did reach out via epic message to the patient's primary neurologist Dr. Arbutus Leas to see if closer outpatient follow-up could be arranged.  I also placed an urgent referral.  Using shared decision making, we decided to pursue outpatient follow-up and patient discharged from the emergency department with very strict return precautions of which the patient and his wife Dr.  Sedalia Muta voiced understanding       [place] Mielke, Wyn Forster, MD 06/13/23 1054

## 2023-06-13 NOTE — Progress Notes (Signed)
Subjective:  Patient ID: Jon Gonzales, male    DOB: Jan 24, 1961  Age: 62 y.o. MRN: [id number]  Chief Complaint  Patient presents with   Medical Management of Chronic Issues    HPI Pt in today stating that since waking up this morning he has felt extremely fatigued and sleepy despite having plenty of rest last night.  He has some associated dizziness and syncope without passing out.  He does have history of menieres disease but states this feels different.  He denies chest pain,sob,edema.  He states he has not been eating or drinking a lot because of decreased appetite (pt has struggled recently with moderate depression and is on meds but has appt for evaluation with psychiatrist scheduled and will be scheduling with counselor as well) BP slightly low this morning but pt was seen yesterday in office for CPE and bp was about the same and he was totally asymptomatic Pt denies any recent med changes besides stopping wellbutrin a week ago Pt is having hard time expressing and finding words today -- also was slightly disoriented to place earlier  Denies headache or vision symptoms     06/11/2023    3:30 PM 04/18/2022   10:08 PM 01/17/2022    4:14 PM  Depression screen PHQ 2/9  Decreased Interest 1 3 3   Down, Depressed, Hopeless 2 3 2   PHQ - 2 Score 3 6 5   Altered sleeping 0 3 3  Tired, decreased energy 0 3 1  Change in appetite 0 2 1  Feeling bad or failure about yourself  1 3 3   Trouble concentrating 3 2 2   Moving slowly or fidgety/restless 0 0 0  Suicidal thoughts 2 1 1   PHQ-9 Score 9 20 16   Difficult doing work/chores Somewhat difficult Somewhat difficult         06/04/2022    8:42 AM 08/11/2022    9:58 AM 08/28/2022    1:33 PM 09/18/2022    9:35 AM 06/11/2023    3:30 PM  Fall Risk  Falls in the past year? 0 0 0 0 1  Was there an injury with Fall? 0 0 0 0 0  Fall Risk Category Calculator 0 0 0 0 1  Fall Risk Category (Retired) Low Low Low Low   (RETIRED) Patient Fall Risk Level  Low fall risk  Low fall risk Low fall risk   Patient at Risk for Falls Due to No Fall Risks   No Fall Risks No Fall Risks  Fall risk Follow up Falls evaluation completed  Falls evaluation completed Falls evaluation completed Falls evaluation completed     ROS CONSTITUTIONAL: see HPI E/N/T: Negative for ear pain, nasal congestion and sore throat.  CARDIOVASCULAR: Negative for chest pain, dizziness, palpitations and pedal edema.  RESPIRATORY: Negative for recent cough and dyspnea.  GASTROINTESTINAL: Negative for abdominal pain, acid reflux symptoms, constipation, diarrhea, nausea and vomiting.  MSK: Negative for arthralgias and myalgias.  INTEGUMENTARY: Negative for rash.  NEUROLOGICAL: see HPI PSYCHIATRIC: see HPI   Current Outpatient Medications:    aspirin EC 81 MG tablet, Take 1 tablet (81 mg total) by mouth daily., Disp: 30 tablet, Rfl: 0   clopidogrel (PLAVIX) 75 MG tablet, Take 1 tablet (75 mg total) by mouth daily., Disp: 90 tablet, Rfl: 2   cyclobenzaprine (FLEXERIL) 10 MG tablet, Take 1 tablet (10 mg total) by mouth 3 (three) times daily as needed for muscle spasms., Disp: 90 tablet, Rfl: 2   DULoxetine (CYMBALTA) 60 MG capsule,  Take 1 capsule (60 mg total) by mouth 2 (two) times daily., Disp: 180 capsule, Rfl: 2   EPINEPHrine (EPIPEN 2-PAK) 0.3 mg/0.3 mL IJ SOAJ injection, Inject 0.3 mg into the muscle as needed for anaphylaxis., Disp: 1 each, Rfl: 3   ezetimibe (ZETIA) 10 MG tablet, Take 1 tablet (10 mg total) by mouth daily., Disp: 90 tablet, Rfl: 2   fexofenadine (ALLEGRA) 60 MG tablet, Take 1 tablet (60 mg total) by mouth daily., Disp: 90 tablet, Rfl: 2   hydrochlorothiazide (HYDRODIURIL) 12.5 MG tablet, Take 1 tablet (12.5 mg total) by mouth daily., Disp: 90 tablet, Rfl: 2   isosorbide mononitrate (IMDUR) 60 MG 24 hr tablet, Take 1 tablet (60 mg total) by mouth daily., Disp: 90 tablet, Rfl: 2   lidocaine (XYLOCAINE) 2 % solution, Use as directed 15 mLs in the mouth or throat  every 6 (six) hours as needed for mouth pain., Disp: 100 mL, Rfl: 0   lithium carbonate (LITHOBID) 300 MG ER tablet, Take 1 tablet (300 mg total) by mouth 2 (two) times daily., Disp: 180 tablet, Rfl: 2   LORazepam (ATIVAN) 1 MG tablet, Take 1 tablet (1 mg total) by mouth every 8 (eight) hours as needed for anxiety., Disp: 90 tablet, Rfl: 0   meclizine (ANTIVERT) 25 MG tablet, Take 1 tablet (25 mg total) by mouth 3 (three) times daily as needed for dizziness., Disp: 90 tablet, Rfl: 2   metoprolol succinate (TOPROL-XL) 50 MG 24 hr tablet, Take 1 tablet by mouth daily. Take with or immediately following a meal., Disp: 90 tablet, Rfl: 2   Multiple Vitamin (MULTIVITAMIN WITH MINERALS) TABS tablet, Take 1 tablet by mouth daily. Men's Multivitamin, Disp: , Rfl:    nitroGLYCERIN (NITROSTAT) 0.4 MG SL tablet, Dissolve 1 tablet (0.4 mg total) under the tongue every 5 (five) minutes up to 3 doses as needed for chest pain. If no relief after 3 doses call 911., Disp: 25 tablet, Rfl: 1   ondansetron (ZOFRAN) 4 MG tablet, Take 1 tablet (4 mg total) by mouth every 8 (eight) hours as needed for nausea or vomiting., Disp: 20 tablet, Rfl: 0   pantoprazole (PROTONIX) 40 MG tablet, Take 1 tablet (40 mg total) by mouth daily., Disp: 90 tablet, Rfl: 2   primidone (MYSOLINE) 50 MG tablet, Take 1 tablet (50 mg total) by mouth in the morning and at bedtime., Disp: 180 tablet, Rfl: 0   ranolazine (RANEXA) 1000 MG SR tablet, Take 1 tablet by mouth 2 times daily., Disp: 180 tablet, Rfl: 2   rosuvastatin (CRESTOR) 40 MG tablet, Take 1 tablet (40 mg total) by mouth daily., Disp: 90 tablet, Rfl: 2   Eszopiclone 3 MG TABS, Take 1 tablet (3 mg total) by mouth immediately before bedtime as needed for sleep., Disp: 90 tablet, Rfl: 2  Past Medical History:  Diagnosis Date   Abnormal stress test small area of mild ischemia inferior wall 05/24/2022   Abscess of buttock 11/23/2020   Bilateral leg edema 01/29/2020   Bipolar 1 disorder  02/04/2023   Chronic headaches 01/30/2021   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   Coronary artery disease involving coronary bypass graft of native heart with angina pectoris 05/22/2016   Cath 2013 LAD 95% stenosis, D1 90% stenosis, RCA 20%, Circ 30%. Previous stent to D1 2012. EF 60%.   Dizziness 10/27/2020   Excessive daytime sleepiness 09/11/2016   Gastroesophageal reflux disease 02/04/2023  Generalized anxiety disorder 05/22/2016   Herpes zoster 01/20/2020   History of small bowel obstruction    Admitted 10-14-2007 for partical small bowel obstruction and N.G. tube was placed. Admitted by Dr Dimas Chyle. 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   Paronychia of finger, left 08/22/2020   PLMD (periodic limb movement disorder) 09/11/2016   PUD (peptic ulcer disease)    Snoring 09/11/2016   TIA (transient ischemic attack)    Tinnitus of both ears 06/26/2022   Tremor 02/04/2023   Objective:  PHYSICAL EXAM:   BP 90/60 (BP Location: Left Arm, Patient Position: Sitting, Cuff Size: Normal)   Pulse 82   Temp 97.7 F (36.5 C) (Temporal)   SpO2 98%    GEN: pt arousable but sleepy - can answer questions but some aphasia noted HEENT: normal external ears and nose - normal external auditory canals and TMS -  Neck: no JVD or masses - no thyromegaly Cardiac: RRR; no murmurs, rubs, or gallops,no edema - Respiratory:  normal respiratory rate and pattern with no  distress - normal breath sounds with no rales, rhonchi, wheezes or rubs MS: no deformity or atrophy  Skin: warm and dry, no rash  Neuro:  Alert but had transient issue with orientation, aphasia noted - - CN II-Xii grossly intact Psych: euthymic mood, appropriate affect and demeanor  EKG - new Twave inversion lead II  Glucose 115  Assessment & Plan:    Syncope, unspecified syncope type -     EKG 12-Lead  Other fatigue -     POCT glucose (manual entry)  Aphasia  Abnormal EKG  Neurological abnormality  Dizziness   OXYGEN ADMINISTERED TO PATIENT AND HE WILL BE TRANSPORTED TO [facility] ED BY EMS FOR FURTHER EVALUATION --- RULE OUT CVA/TIA  Follow-up: Return for after ED evaluation.  An After Visit Summary was printed and given to the patient.   ADDENDUM - PT WAS EVALUATED BY ED - BRAIN MRI AND LABWORK STABLE -WAS NOT ABLE TO CONSULT WITH NEUROLOGIST AT ED AND HAS APPT IN 2 WEEKS  SARA R Kendy Haston, PA-C [facility] [phone]

## 2023-06-14 ENCOUNTER — Encounter (HOSPITAL_BASED_OUTPATIENT_CLINIC_OR_DEPARTMENT_OTHER): Payer: Self-pay | Admitting: *Deleted

## 2023-06-14 ENCOUNTER — Other Ambulatory Visit: Payer: Self-pay | Admitting: Physician Assistant

## 2023-06-14 ENCOUNTER — Encounter: Payer: Self-pay | Admitting: Psychology

## 2023-06-14 ENCOUNTER — Other Ambulatory Visit (HOSPITAL_COMMUNITY)
Admission: RE | Admit: 2023-06-14 | Discharge: 2023-06-14 | Disposition: A | Discharge status: Home / Self Care | Payer: 59 | Source: Ambulatory Visit | Attending: Physician Assistant | Admitting: Physician Assistant

## 2023-06-14 ENCOUNTER — Ambulatory Visit (HOSPITAL_BASED_OUTPATIENT_CLINIC_OR_DEPARTMENT_OTHER): Admission: RE | Admit: 2023-06-14 | Payer: 59 | Source: Ambulatory Visit

## 2023-06-14 ENCOUNTER — Ambulatory Visit (HOSPITAL_COMMUNITY)
Admission: RE | Admit: 2023-06-14 | Discharge: 2023-06-14 | Disposition: A | Discharge status: Home / Self Care | Payer: 59 | Source: Ambulatory Visit | Attending: Physician Assistant | Admitting: Physician Assistant

## 2023-06-14 DIAGNOSIS — I720 Aneurysm of carotid artery: Secondary | ICD-10-CM | POA: Diagnosis not present

## 2023-06-14 DIAGNOSIS — R9089 Other abnormal findings on diagnostic imaging of central nervous system: Secondary | ICD-10-CM

## 2023-06-14 DIAGNOSIS — H814 Vertigo of central origin: Secondary | ICD-10-CM | POA: Diagnosis not present

## 2023-06-14 DIAGNOSIS — R9431 Abnormal electrocardiogram [ECG] [EKG]: Secondary | ICD-10-CM | POA: Diagnosis not present

## 2023-06-14 DIAGNOSIS — Q283 Other malformations of cerebral vessels: Secondary | ICD-10-CM

## 2023-06-14 DIAGNOSIS — R42 Dizziness and giddiness: Secondary | ICD-10-CM | POA: Diagnosis not present

## 2023-06-14 LAB — TROPONIN I (HIGH SENSITIVITY): Troponin I (High Sensitivity): 2 ng/L (ref <18)

## 2023-06-14 MED ORDER — IOHEXOL 350 MG/ML SOLN
75.0000 mL | Freq: Once | INTRAVENOUS | Status: AC | PRN
  Administered 2023-06-14: 75 mL via INTRAVENOUS

## 2023-06-15 LAB — LIPID PANEL
Chol/HDL Ratio: 2.4 ratio (ref 0.0–5.0)
Cholesterol, Total: 99 mg/dL — ABNORMAL LOW (ref 100–199)
HDL: 41 mg/dL (ref >39)
LDL Chol Calc (NIH): 38 mg/dL (ref 0–99)
Triglycerides: 109 mg/dL (ref 0–149)
VLDL Cholesterol Cal: 20 mg/dL (ref 5–40)

## 2023-06-15 LAB — TESTOSTERONE,FREE AND TOTAL
Testosterone, Free: 5.2 pg/mL — ABNORMAL LOW (ref 6.6–18.1)
Testosterone: 496 ng/dL (ref 264–916)

## 2023-06-15 LAB — CALCIUM, IONIZED: Calcium, Ion: 4.6 mg/dL (ref 4.5–5.6)

## 2023-06-15 LAB — PSA: Prostate Specific Ag, Serum: 0.7 ng/mL (ref 0.0–4.0)

## 2023-06-18 ENCOUNTER — Telehealth (HOSPITAL_COMMUNITY): Payer: Self-pay

## 2023-06-18 LAB — CALCIUM, IONIZED: Calcium, Ionized, Serum: 4.6 mg/dL (ref 4.5–5.6)

## 2023-06-18 NOTE — Telephone Encounter (Signed)
Returned call to Dr. Sedalia Muta regarding pt's recent cta. Dr. Corliss Skains is not here to review. Left vm for her to return call. AB

## 2023-06-19 DIAGNOSIS — I720 Aneurysm of carotid artery: Secondary | ICD-10-CM | POA: Diagnosis not present

## 2023-06-21 ENCOUNTER — Ambulatory Visit (INDEPENDENT_AMBULATORY_CARE_PROVIDER_SITE_OTHER): Payer: Self-pay | Admitting: Psychiatry

## 2023-06-21 ENCOUNTER — Encounter: Payer: Self-pay | Admitting: Psychology

## 2023-06-21 ENCOUNTER — Encounter: Payer: Self-pay | Admitting: Psychiatry

## 2023-06-21 VITALS — BP 121/84 | HR 73 | Ht 70.0 in | Wt 186.0 lb

## 2023-06-21 DIAGNOSIS — F314 Bipolar disorder, current episode depressed, severe, without psychotic features: Secondary | ICD-10-CM

## 2023-06-21 DIAGNOSIS — R4189 Other symptoms and signs involving cognitive functions and awareness: Secondary | ICD-10-CM

## 2023-06-21 DIAGNOSIS — F411 Generalized anxiety disorder: Secondary | ICD-10-CM

## 2023-06-21 DIAGNOSIS — F4001 Agoraphobia with panic disorder: Secondary | ICD-10-CM

## 2023-06-21 NOTE — Patient Instructions (Signed)
Stop cyclobenzaprine to see if concentration or memory is better Start Vraylar 1.5 mg daily for 10 days, if not better then increase to 3 mg daily for 2 weeks, If not better call

## 2023-06-21 NOTE — Progress Notes (Signed)
[facility] MD/PA/NP Initial Note  06/21/2023 3:10 PM Jon Gonzales  MRN:  [id number]  Chief Complaint:  Chief Complaint   Establish Care; Memory Loss; Depression; Anxiety; Fatigue   Dep , anxiety, memory problems  HPI:  Seen with W Dr. Blane Ohara, FP  Has seen neurologist. 4-5 days per week episodes dizziness requiring him to stop what he is doing Seen with wife.  Chronic dep and anxiety.  Missing work lately with dep.  Stressed with multiple changes at work.  Chronic worrier.   Dep chronic since childhood.   W says he'll ruminate over sending emails and people not responding right away.  Keeps him from sleeping sometimes.  "Are some of these people out to get me".  Recognizes paranoid thoughts are excessive; since Alyse Low left Jan 2022.  This is excessive.   Cognitive concerns over the last 2-3 years, worse since Covid.  Will lose train of thought while doing work tasks.  Will forget conversations.  "I guess I'm stupid" is what he'll say.   He manages her home finances and tasks.  But doing a more poor response.   Her practice joined [employer] 2018.    Dep chronic in recent years.    Constantly dwelling on things.  Energy fair.  Sleep a lot for 3-4 years.  Sleep 9 hours.   Dr. Richardean Chimera ruled out sleep apnea.  Lost a lot of wt 50# intentional.  Poor appetite. No sig sleepiness.   SI last week.  Gets panic not daily.    No history bipolar but 2017 onset mania with rapid speech and drivenness and paranoia.  No further manic sx   Back pain taking Flexeril BID but not helping.  Psych med: Duloxetine 120 daily for years.  Will get dep if try reducing it.   Lithobid 300 mg BID Primidone 25 BID for tremor Lorazepam 1 mg BID and 1 prn since 2012 Meclizine prn Little caffeine.   SE ED 10 years  Was drinking about 4 beers per night into the last few weeks and has reduced to 2 beers per night.  No drug abuse or alcohol abuse.  Visit Diagnosis:    ICD-10-CM   1. Bipolar I disorder with  depression, severe (HCC)  F31.4     2. Generalized anxiety disorder  F41.1     3. Panic disorder with agoraphobia  F40.01     4. 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  He has been managed by his primary care provider recently. No psych hosp  No SA  04/01/2023 Dr. Earna Coder murmurs: Diagnosed mild cognitive impairment with nonspecific pattern of impairment.  Past Psychiatric Medication Trials:  Wellbutrin XL 150 ? Anxiety Antidepressants tried around 2008 2012 include sertraline, Lexapro headache, Effexor XR, Pristiq.,  Nefazodone 50 mg twice daily, Trintellix for 2 months 2014 with headache Clonazepam December 2020 to January 2021, lorazepam for years Lamotrigine for 2 months 2012, topiramate, Trileptal briefly 2018 Latuda 5 months 2018 with no apparent response and questionable dose Vraylar 3 mg daily August 2000 01 July 2016 Olanzapine 15 1 month September 2017 Buspirone 30 twice daily no response Mirtazapine May to December 2018 Ambien, Lunesta Cyproheptadine   Past Medical History:  Past Medical History:  Diagnosis Date   Abnormal stress test small area of mild ischemia inferior wall 05/24/2022   Abscess of buttock 11/23/2020   Bilateral leg edema 01/29/2020   Bipolar 1 disorder 02/04/2023   Chronic headaches  01/30/2021   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   Coronary artery disease involving coronary bypass graft of native heart with angina pectoris 05/22/2016   Cath 2013 LAD 95% stenosis, D1 90% stenosis, RCA 20%, Circ 30%. Previous stent to D1 2012. EF 60%.   Dizziness 10/27/2020   Excessive daytime sleepiness 09/11/2016   Gastroesophageal reflux disease 02/04/2023   Generalized anxiety disorder 05/22/2016   Herpes zoster 01/20/2020   History of small bowel obstruction    Admitted 10-14-2007 for partical small bowel obstruction and N.G. tube was  placed. Admitted by Dr Dimas Chyle. 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 Small internal hemorrhoids. Otherwise normal colonoscopy to terminal ileum   CORONARY ARTERY BYPASS GRAFT  02/19/2012   L - LAD, SVG - D1, SVG - OM   CORONARY STENT INTERVENTION N/A 11/05/2019   Procedure: CORONARY STENT INTERVENTION;  Surgeon: Runell Gess, MD;  Location: [facility];  Service: Cardiovascular;  Laterality: N/A;  SVG to DIAG   ESOPHAGOGASTRODUODENOSCOPY  01/08/2007   Multiple duodenal ulcers with stenosis/stricture of the distal first portion of the duodenum (measuring approximantely 9mm) Mild gastritis.   LEFT HEART CATH AND CORS/GRAFTS ANGIOGRAPHY N/A 11/05/2019   Procedure: LEFT HEART CATH AND CORS/GRAFTS ANGIOGRAPHY;  Surgeon: Runell Gess, MD;  Location: [facility];  Service: Cardiovascular;  Laterality: N/A;   RENAL ANGIOGRAPHY N/A 11/05/2019   Procedure: RENAL ANGIOGRAPHY;  Surgeon: Runell Gess, MD;  Location: [facility];  Service: Cardiovascular;  Laterality: N/A;   TONSILLECTOMY AND ADENOIDECTOMY     WRIST SURGERY      Family Psychiatric  History: P cousin suicide, P aunt  Older sis and B no mental health dx M died when pt 62 yo.  F no mental health problems.  Died doing repair on pt's Triumph which fell on him 2004. Uncle early dementia mat  Family History:  Family History  Problem Relation Age of Onset   Aneurysm Mother 41       cerebral   CAD Father 65   Heart disease Sister        Unclear   Healthy Brother    Dementia Other        several individuals on extended paternal side of family    Social History:  married to Dr. Blane Ohara 2007 Couple beers with dinner. Social History   Socioeconomic History   Marital status: Married    Spouse name: Not on file   Number of children: Not on file   Years of education: 16   Highest education level: Bachelor's degree (e.g., BA, AB,  BS)  Occupational History   Occupation: Oncologist: [employer]  Tobacco Use   Smoking status: Never   Smokeless tobacco: Never  Vaping Use   Vaping status: Never Used  Substance and Sexual Activity   Alcohol use: Yes    Alcohol/week: 14.0 standard drinks of alcohol    Types: 14 Cans of beer per week    Comment: ~ 2 beers nightly   Drug use: Never   Sexual activity: Not on file  Other Topics Concern   Not on file  Social History Narrative   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        Right Handed   Lives in two story home    Social Determinants of Health   Financial Resource Strain: Low Risk  (06/04/2022)   Overall Financial Resource Strain (CARDIA)    Difficulty of Paying Living Expenses: Not hard at all  Food Insecurity: No Food Insecurity (06/04/2022)   Hunger Vital Sign    Worried About Running Out of Food in the Last Year: Never true    Ran Out of Food in the Last Year: Never true  Transportation Needs: No Transportation Needs (06/04/2022)   PRAPARE - Administrator, Civil Service (Medical): No    Lack of Transportation  (Non-Medical): No  Physical Activity: Inactive (06/04/2022)   Exercise Vital Sign    Days of Exercise per Week: 0 days    Minutes of Exercise per Session: 0 min  Stress: No Stress Concern Present (06/04/2022)   [employer] of Occupational Health - Occupational Stress Questionnaire    Feeling of Stress : Not at all  Social Connections: Moderately Integrated (06/04/2022)   Social Connection and Isolation Panel [NHANES]    Frequency of Communication with Friends and Family: More than three times a week    Frequency of Social Gatherings with Friends and Family: More than three times a week    Attends Religious Services: Never    Database administrator or Organizations: Yes    Attends Engineer, structural: More than 4 times per year    Marital Status: Married    Allergies:  Allergies  Allergen Reactions   Trintellix [Vortioxetine] Other (See Comments)    Headaches.   Penicillins Rash    Did it involve swelling of the face/tongue/throat, SOB, or low BP? Unknown Did it involve sudden or severe rash/hives, skin peeling, or any reaction on the inside of your mouth or nose? Unknown Did you need to seek medical attention at a hospital or doctor's office? Unknown When did it last happen? childhood reaction      If all above answers are "NO", may proceed with cephalosporin use.     Metabolic Disorder Labs: No results found for: "HGBA1C", "MPG" No results found for: "PROLACTIN" Lab Results  Component Value Date   CHOL 99 (L) 06/12/2023   TRIG 109 06/12/2023   HDL 41 06/12/2023   CHOLHDL 2.4 06/12/2023   LDLCALC 38 06/12/2023   LDLCALC 45 02/05/2023   Lab Results  Component Value Date   TSH 0.650 06/12/2023   TSH 3.860 05/31/2023    Therapeutic Level Labs: Lab Results  Component Value Date   LITHIUM 0.9 02/05/2023   LITHIUM 0.9 06/22/2022   No results found for: "VALPROATE" No results found for: "CBMZ"  Current Medications: Current Outpatient Medications   Medication Sig Dispense Refill   aspirin  EC 81 MG tablet Take 1 tablet (81 mg total) by mouth daily. 30 tablet 0   clopidogrel (PLAVIX) 75 MG tablet Take 1 tablet (75 mg total) by mouth daily. 90 tablet 2   cyclobenzaprine (FLEXERIL) 10 MG tablet Take 1 tablet (10 mg total) by mouth 3 (three) times daily as needed for muscle spasms. (Patient taking differently: Take 10 mg by mouth 2 (two) times daily.) 90 tablet 2   DULoxetine (CYMBALTA) 60 MG capsule Take 1 capsule (60 mg total) by mouth 2 (two) times daily. 180 capsule 2   EPINEPHrine (EPIPEN 2-PAK) 0.3 mg/0.3 mL IJ SOAJ injection Inject 0.3 mg into the muscle as needed for anaphylaxis. 1 each 3   ezetimibe (ZETIA) 10 MG tablet Take 1 tablet (10 mg total) by mouth daily. 90 tablet 2   fexofenadine (ALLEGRA) 60 MG tablet Take 1 tablet (60 mg total) by mouth daily. 90 tablet 2   hydrochlorothiazide (HYDRODIURIL) 12.5 MG tablet Take 1 tablet (12.5 mg total) by mouth daily. 90 tablet 2   isosorbide mononitrate (IMDUR) 60 MG 24 hr tablet Take 1 tablet (60 mg total) by mouth daily. 90 tablet 2   lidocaine (XYLOCAINE) 2 % solution Use as directed 15 mLs in the mouth or throat every 6 (six) hours as needed for mouth pain. 100 mL 0   lithium carbonate (LITHOBID) 300 MG ER tablet Take 1 tablet (300 mg total) by mouth 2 (two) times daily. 180 tablet 2   LORazepam (ATIVAN) 1 MG tablet Take 1 tablet (1 mg total) by mouth every 8 (eight) hours as needed for anxiety. 90 tablet 0   meclizine (ANTIVERT) 25 MG tablet Take 1 tablet (25 mg total) by mouth 3 (three) times daily as needed for dizziness. 90 tablet 2   metoprolol succinate (TOPROL-XL) 50 MG 24 hr tablet Take 1 tablet by mouth daily. Take with or immediately following a meal. 90 tablet 2   Multiple Vitamin (MULTIVITAMIN WITH MINERALS) TABS tablet Take 1 tablet by mouth daily. Men's Multivitamin     nitroGLYCERIN (NITROSTAT) 0.4 MG SL tablet Dissolve 1 tablet (0.4 mg total) under the tongue every 5 (five)  minutes up to 3 doses as needed for chest pain. If no relief after 3 doses call 911. 25 tablet 1   ondansetron (ZOFRAN) 4 MG tablet Take 1 tablet (4 mg total) by mouth every 8 (eight) hours as needed for nausea or vomiting. 20 tablet 0   pantoprazole (PROTONIX) 40 MG tablet Take 1 tablet (40 mg total) by mouth daily. 90 tablet 2   primidone (MYSOLINE) 50 MG tablet Take 1 tablet (50 mg total) by mouth in the morning and at bedtime. 180 tablet 0   ranolazine (RANEXA) 1000 MG SR tablet Take 1 tablet by mouth 2 times daily. 180 tablet 2   rosuvastatin (CRESTOR) 40 MG tablet Take 1 tablet (40 mg total) by mouth daily. 90 tablet 2   No current facility-administered medications for this visit.    Medication Side Effects: none  Orders placed this visit:  No orders of the defined types were placed in this encounter.   Psychiatric Specialty Exam:  Review of Systems  Constitutional:  Positive for fatigue.  HENT:  Positive for hearing loss and tinnitus. Negative for congestion and sinus pain.   Eyes:  Positive for pain.  Respiratory:  Positive for shortness of breath. Negative for chest tightness.   Cardiovascular:  Negative for chest pain and palpitations.  Gastrointestinal:  Positive for constipation and diarrhea.  Genitourinary:  Negative for difficulty urinating, dysuria and urgency.  Musculoskeletal:  Positive for back pain.  Skin:  Negative for rash.  Neurological:  Positive for dizziness, tremors and headaches.  Psychiatric/Behavioral:  Positive for decreased concentration, dysphoric mood and suicidal ideas. Negative for hallucinations and self-injury. The patient is nervous/anxious.     Blood pressure 121/84, pulse 73, height 5\' 10"  (1.778 m), weight 186 lb (84.4 kg).Body mass index is 26.69 kg/m.  General Appearance: Neat  Eye Contact:  Good  Speech:  Normal Rate  Volume:  Normal  Mood:  Anxious and Depressed  Affect:  Congruent, Constricted, and Depressed  Thought Process:  Goal  Directed and Descriptions of Associations: Intact  Orientation:  Full (Time, Place, and Person)  Thought Content: Paranoid Ideation ; no hallucinations  Suicidal Thoughts:  No today but did have last week  Homicidal Thoughts:  No  Memory:   reported.  See neuropsych testing  Judgement:  Fair  Insight:  Fair  Psychomotor Activity:  Decreased  Concentration:  Concentration: Fair  Recall:  Fair  Fund of Knowledge: Good  Language: Good  Assets:  Communication Skills Desire for Improvement Financial Resources/Insurance Housing Intimacy Social Support Talents/Skills Transportation Vocational/Educational  ADL's:  Intact  Cognition: Impaired,  Mild  Prognosis:  Fair   Screenings:  GAD-7    Flowsheet Row Office Visit from 06/11/2023 in [facility] Office Visit from 04/18/2022 in [facility]  Total GAD-7 Score 6 14      PHQ2-9    Flowsheet Row Office Visit from 06/11/2023 in [facility] Office Visit from 04/18/2022 in [facility] Office Visit from 01/17/2022 in [facility]  PHQ-2 Total Score 3 6 5   PHQ-9 Total Score 9 20 16       Flowsheet Row ED from 06/12/2023 in [facility]  C-SSRS RISK CATEGORY No Risk       Receiving Psychotherapy:  not yet but seeking  Treatment Plan/Recommendations:  Mr. Auth has a lifelong history of depression since childhood with a manic episode in 2017.  Diagnosis is changed to bipolar disorder.  He has a long history of generalized anxiety disorder and recent history of panic disorder.  He also had neuropsychological testing which suggested mild cognitive impairment. In the context of severe depression with no organic etiology found for the cognitive impairment is suggestive of pseudodementia.  We need to try to aggressively treat the depression. The depression is associated with mild psychotic features of  paranoia.  These have existed previously at times as well.  We discussed the role of lithium as a mood stabilizer.  It has stopped mood cycling but has not adequately controlled his depression.  Reported lithium levels have been stable. Counseled patient regarding potential benefits, risks, and side effects of lithium to include potential risk of lithium affecting thyroid and renal function.  Discussed need for periodic lab monitoring to determine drug level and to assess for potential adverse effects.  Counseled patient regarding signs and symptoms of lithium toxicity and advised that they notify office immediately or seek urgent medical attention if experiencing these signs and symptoms.  Patient advised to contact office with any questions or concerns.  Because of the psychotic symptoms is appropriate to use an antipsychotic that could also help with depression.  He has made great effort to lose significant amount of weight so he is keen to avoid weight gain.  The 2 best options would be Vraylar or Jordan.  Leafy Kindle is probably the easiest of the 2 medications taken is the most activating.  However he did not respond to either of these 2 medications when given in the past but both trials were reasonably brief.  It is certainly worth trying these medications again. Disc SE risks between options.    Another option might be to consider pramipexole off-label but that has not potential to worsen paranoid thinking so we will defer this option.  We need to also rule out other potential causes for cognitive impairment.  He is taking cyclobenzaprine but getting no benefit.  Suggest that he discontinue cyclobenzaprine.  We also discussed the risk of lorazepam causing cognitive impairment.  However he is having panic attacks and severe anxiety and for now that medication is appropriate.  He is probably the least likely of the benzodiazepines cause cognitive impairment.  Plans to start counseling  Discussed  safety plan at length with patient.  Advised patient to contact office with any worsening signs and symptoms.  Instructed patient to go to the [facility] emergency room for evaluation if experiencing any acute safety concerns, to include suicidal intent.  Stop cyclobenzaprine to see if concentration or memory is better Start Vraylar 1.5 mg daily for 10 days, if not better then increase to 3 mg daily for 2 weeks, If not better call  Follow-up as soon as possible  Lauraine Rinne, MD, DFAPA

## 2023-06-24 ENCOUNTER — Ambulatory Visit (INDEPENDENT_AMBULATORY_CARE_PROVIDER_SITE_OTHER): Payer: 59 | Admitting: Physician Assistant

## 2023-06-24 ENCOUNTER — Encounter: Payer: Self-pay | Admitting: Physician Assistant

## 2023-06-24 VITALS — BP 122/80 | HR 72 | Temp 97.2°F | Ht 70.0 in | Wt 176.0 lb

## 2023-06-24 DIAGNOSIS — G8929 Other chronic pain: Secondary | ICD-10-CM

## 2023-06-24 DIAGNOSIS — M5441 Lumbago with sciatica, right side: Secondary | ICD-10-CM | POA: Diagnosis not present

## 2023-06-24 HISTORY — DX: Other chronic pain: G89.29

## 2023-06-24 MED ORDER — PREDNISONE 20 MG PO TABS: ORAL_TABLET | ORAL | 0 refills | Status: DC

## 2023-06-24 NOTE — Progress Notes (Signed)
Acute Office Visit  Subjective:    Patient ID: Jon Gonzales, male    DOB: Aug 22, 1961, 62 y.o.   MRN: [id number]  Chief Complaint  Patient presents with   Back Pain    HPI: Patient is in today for complaints of bilateral low back pain - he states it has been going on for the past 3 months.  He did have a significant fall down some steps about the same time and had significant bruising of right leg and hip.  He describes pain as tightness with movement and soreness to palpation at times  - he feels pulling in his left leg when he moves in shower.  Has had some intermittent pains radiating down his leg   Current Outpatient Medications:    aspirin EC 81 MG tablet, Take 1 tablet (81 mg total) by mouth daily., Disp: 30 tablet, Rfl: 0   clopidogrel (PLAVIX) 75 MG tablet, Take 1 tablet (75 mg total) by mouth daily., Disp: 90 tablet, Rfl: 2   cyclobenzaprine (FLEXERIL) 10 MG tablet, Take 1 tablet (10 mg total) by mouth 3 (three) times daily as needed for muscle spasms. (Patient taking differently: Take 10 mg by mouth 2 (two) times daily.), Disp: 90 tablet, Rfl: 2   DULoxetine (CYMBALTA) 60 MG capsule, Take 1 capsule (60 mg total) by mouth 2 (two) times daily., Disp: 180 capsule, Rfl: 2   EPINEPHrine (EPIPEN 2-PAK) 0.3 mg/0.3 mL IJ SOAJ injection, Inject 0.3 mg into the muscle as needed for anaphylaxis., Disp: 1 each, Rfl: 3   ezetimibe (ZETIA) 10 MG tablet, Take 1 tablet (10 mg total) by mouth daily., Disp: 90 tablet, Rfl: 2   fexofenadine (ALLEGRA) 60 MG tablet, Take 1 tablet (60 mg total) by mouth daily., Disp: 90 tablet, Rfl: 2   hydrochlorothiazide (HYDRODIURIL) 12.5 MG tablet, Take 1 tablet (12.5 mg total) by mouth daily., Disp: 90 tablet, Rfl: 2   isosorbide mononitrate (IMDUR) 60 MG 24 hr tablet, Take 1 tablet (60 mg total) by mouth daily., Disp: 90 tablet, Rfl: 2   lidocaine (XYLOCAINE) 2 % solution, Use as directed 15 mLs in the mouth or throat every 6 (six) hours as needed for mouth  pain., Disp: 100 mL, Rfl: 0   lithium carbonate (LITHOBID) 300 MG ER tablet, Take 1 tablet (300 mg total) by mouth 2 (two) times daily., Disp: 180 tablet, Rfl: 2   LORazepam (ATIVAN) 1 MG tablet, Take 1 tablet (1 mg total) by mouth every 8 (eight) hours as needed for anxiety., Disp: 90 tablet, Rfl: 0   meclizine (ANTIVERT) 25 MG tablet, Take 1 tablet (25 mg total) by mouth 3 (three) times daily as needed for dizziness., Disp: 90 tablet, Rfl: 2   metoprolol succinate (TOPROL-XL) 50 MG 24 hr tablet, Take 1 tablet by mouth daily. Take with or immediately following a meal., Disp: 90 tablet, Rfl: 2   Multiple Vitamin (MULTIVITAMIN WITH MINERALS) TABS tablet, Take 1 tablet by mouth daily. Men's Multivitamin, Disp: , Rfl:    nitroGLYCERIN (NITROSTAT) 0.4 MG SL tablet, Dissolve 1 tablet (0.4 mg total) under the tongue every 5 (five) minutes up to 3 doses as needed for chest pain. If no relief after 3 doses call 911., Disp: 25 tablet, Rfl: 1   ondansetron (ZOFRAN) 4 MG tablet, Take 1 tablet (4 mg total) by mouth every 8 (eight) hours as needed for nausea or vomiting., Disp: 20 tablet, Rfl: 0   pantoprazole (PROTONIX) 40 MG tablet, Take 1 tablet (40 mg  total) by mouth daily., Disp: 90 tablet, Rfl: 2   primidone (MYSOLINE) 50 MG tablet, Take 1 tablet (50 mg total) by mouth in the morning and at bedtime., Disp: 180 tablet, Rfl: 0   ranolazine (RANEXA) 1000 MG SR tablet, Take 1 tablet by mouth 2 times daily., Disp: 180 tablet, Rfl: 2   rosuvastatin (CRESTOR) 40 MG tablet, Take 1 tablet (40 mg total) by mouth daily., Disp: 90 tablet, Rfl: 2  Allergies  Allergen Reactions   Trintellix [Vortioxetine] Other (See Comments)    Headaches.   Penicillins Rash    Did it involve swelling of the face/tongue/throat, SOB, or low BP? Unknown Did it involve sudden or severe rash/hives, skin peeling, or any reaction on the inside of your mouth or nose? Unknown Did you need to seek medical attention at a hospital or doctor's  office? Unknown When did it last happen? childhood reaction      If all above answers are "NO", may proceed with cephalosporin use.     ROS CONSTITUTIONAL: Negative for chills, fatigue, fever,  MSK: see HPI INTEGUMENTARY: no rash      Objective:    PHYSICAL EXAM:   BP 122/80 (BP Location: Left Arm, Patient Position: Sitting, Cuff Size: Large)   Pulse 72   Temp (!) 97.2 F (36.2 C) (Temporal)   Ht 5\' 10"  (1.778 m)   Wt 176 lb (79.8 kg)   SpO2 95%   BMI 25.25 kg/m    GEN: Well nourished, well developed, in no acute distress  Cardiac: RRR; no murmurs,  Respiratory:  normal respiratory rate and pattern with no distress - normal breath sounds with no rales, rhonchi, wheezes or rubs  MS: palpable tenderness to lumbar spine area bilaterally - no specific point tenderness to right hip. Right leg raise negative to 90 degress but does cause pulling in back of leg -- normal rom and strength of hip/leg Skin: warm and dry, no rash       Assessment & Plan:    Chronic bilateral low back pain with right-sided sciatica     Follow-up: Return if symptoms worsen or fail to improve.  An After Visit Summary was printed and given to the patient.  Jettie Pagan [facility] [phone]

## 2023-06-25 ENCOUNTER — Other Ambulatory Visit (HOSPITAL_COMMUNITY): Payer: Self-pay

## 2023-06-27 DIAGNOSIS — F4322 Adjustment disorder with anxiety: Secondary | ICD-10-CM | POA: Diagnosis not present

## 2023-06-28 ENCOUNTER — Ambulatory Visit: Payer: 59 | Admitting: Physician Assistant

## 2023-07-01 ENCOUNTER — Other Ambulatory Visit (HOSPITAL_COMMUNITY): Payer: Self-pay

## 2023-07-02 ENCOUNTER — Other Ambulatory Visit: Payer: Self-pay | Admitting: Physician Assistant

## 2023-07-02 ENCOUNTER — Other Ambulatory Visit (HOSPITAL_COMMUNITY): Payer: Self-pay

## 2023-07-02 DIAGNOSIS — G8929 Other chronic pain: Secondary | ICD-10-CM

## 2023-07-02 MED ORDER — PREDNISONE 20 MG PO TABS
ORAL_TABLET | ORAL | 0 refills | Status: DC
Start: 2023-07-02 — End: 2023-07-22

## 2023-07-03 DIAGNOSIS — M51369 Other intervertebral disc degeneration, lumbar region without mention of lumbar back pain or lower extremity pain: Secondary | ICD-10-CM | POA: Insufficient documentation

## 2023-07-03 DIAGNOSIS — M5136 Other intervertebral disc degeneration, lumbar region: Secondary | ICD-10-CM | POA: Diagnosis not present

## 2023-07-03 DIAGNOSIS — M5416 Radiculopathy, lumbar region: Secondary | ICD-10-CM | POA: Diagnosis not present

## 2023-07-03 DIAGNOSIS — M47816 Spondylosis without myelopathy or radiculopathy, lumbar region: Secondary | ICD-10-CM

## 2023-07-03 DIAGNOSIS — M5431 Sciatica, right side: Secondary | ICD-10-CM | POA: Diagnosis not present

## 2023-07-03 HISTORY — DX: Spondylosis without myelopathy or radiculopathy, lumbar region: M47.816

## 2023-07-03 HISTORY — DX: Radiculopathy, lumbar region: M54.16

## 2023-07-03 HISTORY — DX: Other intervertebral disc degeneration, lumbar region without mention of lumbar back pain or lower extremity pain: M51.369

## 2023-07-22 ENCOUNTER — Ambulatory Visit (INDEPENDENT_AMBULATORY_CARE_PROVIDER_SITE_OTHER): Payer: 59 | Admitting: Physician Assistant

## 2023-07-22 ENCOUNTER — Encounter: Payer: Self-pay | Admitting: Physician Assistant

## 2023-07-22 VITALS — BP 118/80 | HR 76 | Resp 16 | Ht 70.0 in | Wt 177.6 lb

## 2023-07-22 DIAGNOSIS — R519 Headache, unspecified: Secondary | ICD-10-CM | POA: Diagnosis not present

## 2023-07-22 LAB — SEDIMENTATION RATE: Sed Rate: 2 mm/h (ref 0–30)

## 2023-07-22 MED ORDER — PREDNISONE 20 MG PO TABS: ORAL_TABLET | ORAL | 0 refills | Status: DC

## 2023-07-22 NOTE — Progress Notes (Signed)
Acute Office Visit  Subjective:    Patient ID: Jon Gonzales, male    DOB: 1961-03-09, 62 y.o.   MRN: [id number]  Chief Complaint  Patient presents with   Headache    Left side, temple and behind eye    HPI: Patient is in today for complaints of headache that started 5-6 days ago.  Pt states that he has a constant dull ache behind left eye, left temple and some pain in his left ear.  He denies any numbness or tingling of face.  He denies vision symptoms.  Last vision exam more than 3 years ago He has tried to relieve with goody powders, tylenol, advil, ubrelvy, heating pad and cold cloths with no relief Pt has had recent MRI and MRA of brain   Current Outpatient Medications:    aspirin EC 81 MG tablet, Take 1 tablet (81 mg total) by mouth daily., Disp: 30 tablet, Rfl: 0   clopidogrel (PLAVIX) 75 MG tablet, Take 1 tablet (75 mg total) by mouth daily., Disp: 90 tablet, Rfl: 2   DULoxetine (CYMBALTA) 60 MG capsule, Take 1 capsule (60 mg total) by mouth 2 (two) times daily., Disp: 180 capsule, Rfl: 2   EPINEPHrine (EPIPEN 2-PAK) 0.3 mg/0.3 mL IJ SOAJ injection, Inject 0.3 mg into the muscle as needed for anaphylaxis., Disp: 1 each, Rfl: 3   ezetimibe (ZETIA) 10 MG tablet, Take 1 tablet (10 mg total) by mouth daily., Disp: 90 tablet, Rfl: 2   fexofenadine (ALLEGRA) 60 MG tablet, Take 1 tablet (60 mg total) by mouth daily., Disp: 90 tablet, Rfl: 2   hydrochlorothiazide (HYDRODIURIL) 12.5 MG tablet, Take 1 tablet (12.5 mg total) by mouth daily., Disp: 90 tablet, Rfl: 2   isosorbide mononitrate (IMDUR) 60 MG 24 hr tablet, Take 1 tablet (60 mg total) by mouth daily., Disp: 90 tablet, Rfl: 2   lidocaine (XYLOCAINE) 2 % solution, Use as directed 15 mLs in the mouth or throat every 6 (six) hours as needed for mouth pain., Disp: 100 mL, Rfl: 0   lithium carbonate (LITHOBID) 300 MG ER tablet, Take 1 tablet (300 mg total) by mouth 2 (two) times daily., Disp: 180 tablet, Rfl: 2   LORazepam (ATIVAN) 1  MG tablet, Take 1 tablet (1 mg total) by mouth every 8 (eight) hours as needed for anxiety., Disp: 90 tablet, Rfl: 0   meclizine (ANTIVERT) 25 MG tablet, Take 1 tablet (25 mg total) by mouth 3 (three) times daily as needed for dizziness., Disp: 90 tablet, Rfl: 2   meloxicam (MOBIC) 15 MG tablet, Take 15 mg by mouth daily., Disp: , Rfl:    methocarbamol (ROBAXIN) 750 MG tablet, Take 750 mg by mouth 2 (two) times daily., Disp: , Rfl:    metoprolol succinate (TOPROL-XL) 50 MG 24 hr tablet, Take 1 tablet by mouth daily. Take with or immediately following a meal., Disp: 90 tablet, Rfl: 2   Multiple Vitamin (MULTIVITAMIN WITH MINERALS) TABS tablet, Take 1 tablet by mouth daily. Men's Multivitamin, Disp: , Rfl:    nitroGLYCERIN (NITROSTAT) 0.4 MG SL tablet, Dissolve 1 tablet (0.4 mg total) under the tongue every 5 (five) minutes up to 3 doses as needed for chest pain. If no relief after 3 doses call 911., Disp: 25 tablet, Rfl: 1   ondansetron (ZOFRAN) 4 MG tablet, Take 1 tablet (4 mg total) by mouth every 8 (eight) hours as needed for nausea or vomiting., Disp: 20 tablet, Rfl: 0   pantoprazole (PROTONIX) 40 MG tablet,  Take 1 tablet (40 mg total) by mouth daily., Disp: 90 tablet, Rfl: 2   predniSONE (DELTASONE) 20 MG tablet, 1 po tid for 3 days then 1 po bid for 3 days then 1 po qd for 3 days, Disp: 18 tablet, Rfl: 0   primidone (MYSOLINE) 50 MG tablet, Take 1 tablet (50 mg total) by mouth in the morning and at bedtime., Disp: 180 tablet, Rfl: 0   ranolazine (RANEXA) 1000 MG SR tablet, Take 1 tablet by mouth 2 times daily., Disp: 180 tablet, Rfl: 2   rosuvastatin (CRESTOR) 40 MG tablet, Take 1 tablet (40 mg total) by mouth daily., Disp: 90 tablet, Rfl: 2   cyclobenzaprine (FLEXERIL) 10 MG tablet, Take 1 tablet (10 mg total) by mouth 3 (three) times daily as needed for muscle spasms. (Patient taking differently: Take 10 mg by mouth 2 (two) times daily.), Disp: 90 tablet, Rfl: 2  Allergies  Allergen Reactions    Trintellix [Vortioxetine] Other (See Comments)    Headaches.   Penicillins Rash    Did it involve swelling of the face/tongue/throat, SOB, or low BP? Unknown Did it involve sudden or severe rash/hives, skin peeling, or any reaction on the inside of your mouth or nose? Unknown Did you need to seek medical attention at a hospital or doctor's office? Unknown When did it last happen? childhood reaction      If all above answers are "NO", may proceed with cephalosporin use.     ROS CONSTITUTIONAL: Negative for chills, fatigue, fever, E/N/T: Negative for ear pain, nasal congestion and sore throat.  CARDIOVASCULAR: Negative for chest pain, dizziness,  RESPIRATORY: Negative for recent cough and dyspnea.   NEUROLOGICAL: see HPI      Objective:    PHYSICAL EXAM:   BP 118/80   Pulse 76   Resp 16   Ht 5\' 10"  (1.778 m)   Wt 177 lb 9.6 oz (80.6 kg)   SpO2 94%   BMI 25.48 kg/m    GEN: Well nourished, well developed, in no acute distress  HEENT: normal external ears and nose - normal external auditory canals and TMS - hearing grossly normal -  Cardiac: RRR; no murmurs, rubs, or gallops,no edema -  Respiratory:  normal respiratory rate and pattern with no distress - normal breath sounds with no rales, rhonchi, wheezes or rubs  Skin: warm and dry, no rash  Neuro:  Alert and Oriented x 3, - CN II-Xii grossly intact Psych: euthymic mood, appropriate affect and demeanor     Assessment & Plan:    Acute intractable headache, unspecified headache type -     Sedimentation rate -     predniSONE; 1 po tid for 3 days then 1 po bid for 3 days then 1 po qd for 3 days  Dispense: 18 tablet; Refill: 0     Follow-up: Return if symptoms worsen or fail to improve.  An After Visit Summary was printed and given to the patient.  Jettie Pagan [facility] [phone]

## 2023-07-23 ENCOUNTER — Other Ambulatory Visit (HOSPITAL_COMMUNITY): Payer: Self-pay

## 2023-07-23 ENCOUNTER — Other Ambulatory Visit: Payer: Self-pay

## 2023-07-23 ENCOUNTER — Other Ambulatory Visit: Payer: Self-pay | Admitting: Physician Assistant

## 2023-07-23 DIAGNOSIS — F411 Generalized anxiety disorder: Secondary | ICD-10-CM

## 2023-07-23 MED ORDER — LORAZEPAM 1 MG PO TABS
1.0000 mg | ORAL_TABLET | Freq: Three times a day (TID) | ORAL | 0 refills | Status: DC | PRN
Start: 2023-07-23 — End: 2023-07-29
  Filled 2023-07-23: qty 90, 30d supply, fill #0

## 2023-07-24 ENCOUNTER — Ambulatory Visit (INDEPENDENT_AMBULATORY_CARE_PROVIDER_SITE_OTHER): Payer: 59

## 2023-07-24 ENCOUNTER — Other Ambulatory Visit: Payer: Self-pay | Admitting: Physician Assistant

## 2023-07-24 DIAGNOSIS — Z23 Encounter for immunization: Secondary | ICD-10-CM

## 2023-07-24 DIAGNOSIS — M792 Neuralgia and neuritis, unspecified: Secondary | ICD-10-CM

## 2023-07-24 MED ORDER — VALACYCLOVIR HCL 1 G PO TABS
1000.0000 mg | ORAL_TABLET | Freq: Two times a day (BID) | ORAL | 0 refills | Status: DC
Start: 2023-07-24 — End: 2023-08-29

## 2023-07-29 ENCOUNTER — Other Ambulatory Visit: Payer: Self-pay | Admitting: Physician Assistant

## 2023-07-29 ENCOUNTER — Encounter: Payer: Self-pay | Admitting: Psychiatry

## 2023-07-29 ENCOUNTER — Other Ambulatory Visit (HOSPITAL_COMMUNITY): Payer: Self-pay

## 2023-07-29 ENCOUNTER — Ambulatory Visit: Payer: 59 | Admitting: Psychiatry

## 2023-07-29 DIAGNOSIS — R4189 Other symptoms and signs involving cognitive functions and awareness: Secondary | ICD-10-CM | POA: Diagnosis not present

## 2023-07-29 DIAGNOSIS — F314 Bipolar disorder, current episode depressed, severe, without psychotic features: Secondary | ICD-10-CM

## 2023-07-29 DIAGNOSIS — F411 Generalized anxiety disorder: Secondary | ICD-10-CM

## 2023-07-29 DIAGNOSIS — F4001 Agoraphobia with panic disorder: Secondary | ICD-10-CM

## 2023-07-29 MED ORDER — CARIPRAZINE HCL 3 MG PO CAPS
3.0000 mg | ORAL_CAPSULE | Freq: Every day | ORAL | 0 refills | Status: DC
Start: 2023-07-29 — End: 2023-10-17
  Filled 2023-07-29: qty 90, 90d supply, fill #0

## 2023-07-29 MED ORDER — DULOXETINE HCL 60 MG PO CPEP
60.0000 mg | ORAL_CAPSULE | Freq: Two times a day (BID) | ORAL | 2 refills | Status: DC
Start: 2023-07-29 — End: 2024-03-02
  Filled 2023-07-29 – 2023-08-27 (×2): qty 180, 90d supply, fill #0
  Filled 2023-11-28: qty 180, 90d supply, fill #1
  Filled 2023-12-19 – 2024-02-10 (×11): qty 180, 90d supply, fill #2
  Filled ????-??-??: fill #2

## 2023-07-29 MED ORDER — LITHIUM CARBONATE ER 300 MG PO TBCR
300.0000 mg | EXTENDED_RELEASE_TABLET | Freq: Two times a day (BID) | ORAL | 0 refills | Status: DC
Start: 2023-07-29 — End: 2023-09-19
  Filled 2023-07-29: qty 180, 90d supply, fill #0

## 2023-07-29 MED ORDER — LORAZEPAM 1 MG PO TABS
1.0000 mg | ORAL_TABLET | Freq: Three times a day (TID) | ORAL | 1 refills | Status: DC | PRN
  Filled 2023-07-29 (×2): qty 90, 30d supply, fill #0
  Filled 2023-08-27: qty 90, 30d supply, fill #1

## 2023-07-29 NOTE — Progress Notes (Signed)
Jon Gonzales [id number] 1961-05-24 62 y.o.  Subjective:   Patient ID:  Jon Gonzales is a 62 y.o. (DOB 1961/04/20) male.  Chief Complaint:  Chief Complaint  Patient presents with   Follow-up   Depression   Anxiety   Memory Loss    HPI Jon Gonzales presents to the office today for follow-up of bipolar 1, generalized anxiety disorder, panic disorder, pseudodementia.  First seen in this office 06/21/2023.  He was encouraged to stop cyclobenzaprine to see if concentration and memory might be better and start Vraylar 1.5 mg for 10 days and if no improvement in depression increase to 3 mg daily.  Wife is Dr. Baxter Hire Trigo who reports that his depression and anxiety are improved but his memory is not.  She notes resolution of suicidal ideation. He agrees with wife's observation.   He says maybe back is a little worse.  Is off cyclobenzaprine.  Psych meds:  duloxetine 120 mg daily, lithium 300 BID, lorazepam 1 mg BID and prn, Vraylar 3 mg daily. No SE known.   Vraylar seemed to work pretty good.  Less anxious and keyed up.  Dep is lighter.  Dep is 3/10 and was 8/10 before.  No SI.  Definitely a change.   Still having the memory problems.  Not sure if he's on Robaxin.  Didn't recognize he had an appt until he got here today.   No problems with work.  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 To help sleep takes OTC , melatonin.   Sleep good usually.  No Sleep apnea and neg sleep study per Dr. Richardean Chimera. No recent panic or Meniere's attacks lately.     Past Psychiatric Medication Trials:  Wellbutrin XL 150 ? Anxiety Antidepressants tried around 2008 2012 include sertraline, Lexapro headache, Effexor XR, Pristiq.,  Nefazodone 50 mg twice daily, Trintellix for 2 months 2014 with headache Clonazepam December 2020 to January 2021, lorazepam for years Lamotrigine for 2 months 2012, topiramate, Trileptal briefly 2018 Latuda 5 months 2018 with no apparent response and  questionable dose Vraylar 3 mg daily August 2000 01 July 2016 Olanzapine 15 1 month September 2017 Buspirone 30 twice daily no response Mirtazapine May to December 2018 Ambien, Lunesta Cyproheptadine  No history bipolar but 2017 onset mania with rapid speech and drivenness and paranoia. No further manic sx   GAD-7    Flowsheet Row Office Visit from 06/11/2023 in [facility] Office Visit from 04/18/2022 in [facility]  Total GAD-7 Score 6 14      PHQ2-9    Flowsheet Row Office Visit from 06/11/2023 in [facility] Office Visit from 04/18/2022 in [facility] Office Visit from 01/17/2022 in [facility]  PHQ-2 Total Score 3 6 5   PHQ-9 Total Score 9 20 16       Flowsheet Row ED from 06/12/2023 in [facility]  C-SSRS RISK CATEGORY No Risk        Review of Systems:  Review of Systems  Cardiovascular:  Negative for chest pain and palpitations.  Psychiatric/Behavioral:  Positive for dysphoric mood. Negative for suicidal ideas. The patient is nervous/anxious.     Medications: I have reviewed the patient's current medications.  Current Outpatient Medications  Medication Sig Dispense Refill   aspirin EC 81 MG tablet Take 1 tablet (81 mg total) by mouth daily. 30 tablet 0  clopidogrel (PLAVIX) 75 MG tablet Take 1 tablet (75 mg total) by mouth daily. 90 tablet 2   EPINEPHrine (EPIPEN 2-PAK) 0.3 mg/0.3 mL IJ SOAJ injection Inject 0.3 mg into the muscle as needed for anaphylaxis. 1 each 3   ezetimibe (ZETIA) 10 MG tablet Take 1 tablet (10 mg total) by mouth daily. 90 tablet 2   fexofenadine (ALLEGRA) 60 MG tablet Take 1 tablet (60 mg total) by mouth daily. 90 tablet 2   hydrochlorothiazide (HYDRODIURIL) 12.5 MG tablet Take 1 tablet (12.5 mg total) by mouth daily. 90 tablet 2   isosorbide mononitrate (IMDUR) 60 MG 24 hr tablet Take 1 tablet (60 mg  total) by mouth daily. 90 tablet 2   lidocaine (XYLOCAINE) 2 % solution Use as directed 15 mLs in the mouth or throat every 6 (six) hours as needed for mouth pain. 100 mL 0   meclizine (ANTIVERT) 25 MG tablet Take 1 tablet (25 mg total) by mouth 3 (three) times daily as needed for dizziness. 90 tablet 2   meloxicam (MOBIC) 15 MG tablet Take 15 mg by mouth daily.     methocarbamol (ROBAXIN) 750 MG tablet Take 750 mg by mouth 2 (two) times daily.     metoprolol succinate (TOPROL-XL) 50 MG 24 hr tablet Take 1 tablet by mouth daily. Take with or immediately following a meal. 90 tablet 2   Multiple Vitamin (MULTIVITAMIN WITH MINERALS) TABS tablet Take 1 tablet by mouth daily. Men's Multivitamin     nitroGLYCERIN (NITROSTAT) 0.4 MG SL tablet Dissolve 1 tablet (0.4 mg total) under the tongue every 5 (five) minutes up to 3 doses as needed for chest pain. If no relief after 3 doses call 911. 25 tablet 1   ondansetron (ZOFRAN) 4 MG tablet Take 1 tablet (4 mg total) by mouth every 8 (eight) hours as needed for nausea or vomiting. 20 tablet 0   pantoprazole (PROTONIX) 40 MG tablet Take 1 tablet (40 mg total) by mouth daily. 90 tablet 2   predniSONE (DELTASONE) 20 MG tablet 1 po tid for 3 days then 1 po bid for 3 days then 1 po qd for 3 days 18 tablet 0   primidone (MYSOLINE) 50 MG tablet Take 1 tablet (50 mg total) by mouth in the morning and at bedtime. 180 tablet 0   ranolazine (RANEXA) 1000 MG SR tablet Take 1 tablet by mouth 2 times daily. 180 tablet 2   rosuvastatin (CRESTOR) 40 MG tablet Take 1 tablet (40 mg total) by mouth daily. 90 tablet 2   valACYclovir (VALTREX) 1000 MG tablet Take 1 tablet (1,000 mg total) by mouth 2 (two) times daily. 20 tablet 0   cariprazine (VRAYLAR) 3 MG capsule Take 1 capsule (3 mg total) by mouth daily. 90 capsule 0   cyclobenzaprine (FLEXERIL) 10 MG tablet Take 1 tablet (10 mg total) by mouth 3 (three) times daily as needed for muscle spasms. 90 tablet 2   DULoxetine  (CYMBALTA) 60 MG capsule Take 1 capsule (60 mg total) by mouth 2 (two) times daily. 180 capsule 2   lithium carbonate (LITHOBID) 300 MG ER tablet Take 1 tablet (300 mg total) by mouth 2 (two) times daily. 180 tablet 0   LORazepam (ATIVAN) 1 MG tablet Take 1 tablet (1 mg total) by mouth every 8 (eight) hours as needed for anxiety. 90 tablet 1   metoprolol tartrate (LOPRESSOR) 50 MG tablet      Semaglutide-Weight Management (WEGOVY) 2.4 MG/0.75ML SOAJ      No  current facility-administered medications for this visit.    Medication Side Effects: None  Allergies:  Allergies  Allergen Reactions   Trintellix [Vortioxetine] Other (See Comments)    Headaches.   Penicillins Rash    Did it involve swelling of the face/tongue/throat, SOB, or low BP? Unknown Did it involve sudden or severe rash/hives, skin peeling, or any reaction on the inside of your mouth or nose? Unknown Did you need to seek medical attention at a hospital or doctor's office? Unknown When did it last happen? childhood reaction      If all above answers are "NO", may proceed with cephalosporin use.     Past Medical History:  Diagnosis Date   Abnormal stress test small area of mild ischemia inferior wall 05/24/2022   Abscess of buttock 11/23/2020   Bilateral leg edema 01/29/2020   Bipolar 1 disorder 02/04/2023   Chronic headaches 01/30/2021   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   Coronary artery disease involving coronary bypass graft of native heart with angina pectoris 05/22/2016   Cath 2013 LAD 95% stenosis, D1 90% stenosis, RCA 20%, Circ 30%. Previous stent to D1 2012. EF 60%.   Dizziness 10/27/2020   Excessive daytime sleepiness 09/11/2016   Gastroesophageal reflux disease 02/04/2023   Generalized anxiety disorder 05/22/2016   Herpes zoster 01/20/2020   History of small bowel obstruction    Admitted 10-14-2007 for partical small  bowel obstruction and N.G. tube was placed. Admitted by Dr Dimas Chyle. 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   Please see review of systems for further details on the patient's review from today.   Objective:   Physical Exam:  There were no vitals taken for this visit.  Physical Exam Constitutional:      General: He is not in acute distress.    Appearance: He is well-developed.  Musculoskeletal:        General: No deformity.  Neurological:     Mental Status: He is alert and oriented to person, place, and time.     Coordination: Coordination normal.  Psychiatric:        Attention and Perception: Attention and perception normal.        Mood and Affect: Mood is depressed. Mood is not anxious. Affect is not labile, blunt, angry or inappropriate.        Speech: Speech normal.        Behavior: Behavior normal.        Thought Content: Thought content normal. Thought content is not delusional. Thought content does not include homicidal or suicidal ideation. Thought content does not include suicidal plan.        Cognition  and Memory: He exhibits impaired recent memory.        Judgment:  Judgment normal.     Lab Review:     Component Value Date/Time   NA 139 06/13/2023 1349   K 3.8 06/13/2023 1349   CL 104 06/13/2023 1349   CO2 24 06/13/2023 1349   GLUCOSE 102 (H) 06/13/2023 1349   GLUCOSE 103 (H) 06/12/2023 1035   BUN 12 06/13/2023 1349   CREATININE 1.24 06/13/2023 1349   CALCIUM 8.8 06/13/2023 1349   PROT 6.5 06/13/2023 1349   ALBUMIN 4.2 06/13/2023 1349   AST 25 06/13/2023 1349   ALT 26 06/13/2023 1349   ALKPHOS 54 06/13/2023 1349   BILITOT 0.4 06/13/2023 1349   GFRNONAA >60 06/12/2023 1035   GFRAA 77 11/15/2020 0809       Component Value Date/Time   WBC 4.6 06/13/2023 1349   WBC 4.1 06/12/2023 1035   RBC 4.06 (L) 06/13/2023 1349   RBC 3.70 (L) 06/12/2023 1035   HGB 13.8 06/13/2023 1349   HCT 40.8 06/13/2023 1349   PLT 139 (L) 06/13/2023 1349   MCV 101 (H) 06/13/2023 1349   MCH 34.0 (H) 06/13/2023 1349   MCH 34.9 (H) 06/12/2023 1035   MCHC 33.8 06/13/2023 1349   MCHC 34.8 06/12/2023 1035   RDW 13.4 06/13/2023 1349   LYMPHSABS 1.4 06/13/2023 1349   MONOABS 0.5 06/12/2023 1035   EOSABS 0.1 06/13/2023 1349   BASOSABS 0.0 06/13/2023 1349    Lithium Lvl  Date Value Ref Range Status  02/05/2023 0.9 0.5 - 1.2 mmol/L Final    Comment:    A concentration of 0.5-0.8 mmol/L is advised for long-term use; concentrations of up to 1.2 mmol/L may be necessary during acute treatment.                                  Detection Limit = 0.1                           <0.1 indicates None Detected    02/05/23 lithium level 0.9 stable sine 2021  05/31/23 D 51, B12 786, TSH 3.86  .res Assessment: Plan:    Jon Gonzales" was seen today for follow-up, depression, anxiety and memory loss.  Diagnoses and all orders for this visit:  Bipolar I disorder with depression, severe (HCC) -     DULoxetine (CYMBALTA) 60 MG capsule; Take 1 capsule (60 mg total) by mouth 2 (two) times daily. -     cariprazine (VRAYLAR) 3 MG capsule; Take 1 capsule (3 mg total) by mouth  daily. -     lithium carbonate (LITHOBID) 300 MG ER tablet; Take 1 tablet (300 mg total) by mouth 2 (two) times daily.  Generalized anxiety disorder -     DULoxetine (CYMBALTA) 60 MG capsule; Take 1 capsule (60 mg total) by mouth 2 (two) times daily. -     LORazepam (ATIVAN) 1 MG tablet; Take 1 tablet (1 mg total) by mouth every 8 (eight) hours as needed for anxiety.  Panic disorder with agoraphobia -     DULoxetine (CYMBALTA) 60 MG capsule; Take 1 capsule (60 mg total) by mouth 2 (two) times daily.  Pseudodementia   He is less depressed with changes but still some memory concerns and residual dep perhaps.   We discussed the short-term risks associated with benzodiazepines including sedation and increased fall risk  among others.  Discussed long-term side effect risk including dependence, potential withdrawal symptoms, and the potential eventual dose-related risk of dementia.  But recent studies from 2020 dispute this association between benzodiazepines and dementia risk. Newer studies in 2020 do not support an association with dementia. Rec try reducing AM lorazepam to see if memory is better.  Lab workup memory px per neuro was unremarkable:  05/31/23 D 51, B12 786, TSH 3.86  Consider reducing lithium slightly to see if memory could be better.  Should be able to get by with less bc not manic and taking another mood stabilizer, Vraylar.  Continue other meds for now including Vraylar 3, duloxetine 120, lithobid 300 BID, lorazepam except the following:    (NAC) N-Acetylcysteine 2 of the  600 mg capsules daily to help with mild cognitive problems.   Try reducing the lorazepam morning dose only to 1/2 tablet to see if memory is any better.     FU 6 weeks.  Meredith Staggers, MD, DFAPA   Please see After Visit Summary for patient specific instructions.  Future Appointments  Date Time Provider Department Center  08/22/2023  3:30 PM Elwyn Reach [facility] None  09/19/2023  2:30 PM  Cottle, Steva Ready., MD [facility] None    No orders of the defined types were placed in this encounter.   -------------------------------

## 2023-07-29 NOTE — Patient Instructions (Signed)
(  NAC) N-Acetylcysteine 2 of the  600 mg capsules daily to help with mild cognitive problems.   Try reducing the lorazepam morning dose only to 1/2 tablet to see if memory is any better.

## 2023-07-30 ENCOUNTER — Ambulatory Visit (INDEPENDENT_AMBULATORY_CARE_PROVIDER_SITE_OTHER): Payer: 59

## 2023-07-30 ENCOUNTER — Other Ambulatory Visit: Payer: Self-pay

## 2023-07-30 ENCOUNTER — Other Ambulatory Visit (HOSPITAL_COMMUNITY): Payer: Self-pay

## 2023-07-30 DIAGNOSIS — Z23 Encounter for immunization: Secondary | ICD-10-CM

## 2023-07-31 ENCOUNTER — Other Ambulatory Visit (HOSPITAL_COMMUNITY): Payer: Self-pay

## 2023-07-31 NOTE — Progress Notes (Signed)
Patient is in office today for a nurse visit for Covid vaccine. Patient Injection was given in the  Left deltoid. Patient tolerated injection well.

## 2023-08-02 ENCOUNTER — Ambulatory Visit (INDEPENDENT_AMBULATORY_CARE_PROVIDER_SITE_OTHER): Payer: 59 | Admitting: Physician Assistant

## 2023-08-02 ENCOUNTER — Encounter: Payer: Self-pay | Admitting: Physician Assistant

## 2023-08-02 ENCOUNTER — Encounter: Payer: Self-pay | Admitting: Psychiatry

## 2023-08-02 VITALS — BP 100/70 | HR 78 | Temp 97.5°F | Resp 14 | Ht 70.0 in | Wt 179.0 lb

## 2023-08-02 DIAGNOSIS — M5441 Lumbago with sciatica, right side: Secondary | ICD-10-CM

## 2023-08-02 DIAGNOSIS — G8929 Other chronic pain: Secondary | ICD-10-CM | POA: Diagnosis not present

## 2023-08-02 NOTE — Progress Notes (Signed)
Subjective:  Patient ID: Jon Gonzales, male    DOB: 02-Jun-1961  Age: 62 y.o. MRN: [id number]  Chief Complaint  Patient presents with   Back Pain    HPI   Patient is here for lower back sharp pain since 6 months ago. It is worse when he walks. His pain today 6-7/10. He went to orthopedics and they gave him flexirel and prednisone, but it did not help. Patient states that he will sometimes have pain radiate down into his feet with some numbness and tingling. States that the medicines have helped a little take the edge off, but have not helped completely.      06/11/2023    3:30 PM 04/18/2022   10:08 PM 01/17/2022    4:14 PM  Depression screen PHQ 2/9  Decreased Interest 1 3 3   Down, Depressed, Hopeless 2 3 2   PHQ - 2 Score 3 6 5   Altered sleeping 0 3 3  Tired, decreased energy 0 3 1  Change in appetite 0 2 1  Feeling bad or failure about yourself  1 3 3   Trouble concentrating 3 2 2   Moving slowly or fidgety/restless 0 0 0  Suicidal thoughts 2 1 1   PHQ-9 Score 9 20 16   Difficult doing work/chores Somewhat difficult Somewhat difficult         06/11/2023    3:30 PM  Fall Risk   Falls in the past year? 1  Number falls in past yr: 0  Injury with Fall? 0  Risk for fall due to : No Fall Risks  Follow up Falls evaluation completed    Patient Care Team: [name], PA-C as PCP - General (Physician Assistant) Thomasene Ripple, DO as PCP - Cardiology (Cardiology) Tat, Octaviano Batty, DO as Consulting Physician (Neurology)   Review of Systems  Constitutional:  Negative for chills, fatigue, fever and unexpected weight change.  HENT:  Negative for congestion, ear pain, sinus pain and sore throat.   Cardiovascular:  Negative for chest pain and palpitations.  Gastrointestinal:  Negative for abdominal pain, blood in stool, constipation, diarrhea, nausea and vomiting.  Endocrine: Negative for polydipsia.  Genitourinary:  Negative for dysuria.  Musculoskeletal:  Positive for back pain (lower  pain).  Skin:  Negative for rash.  Neurological:  Negative for headaches.    Current Outpatient Medications on File Prior to Visit  Medication Sig Dispense Refill   aspirin EC 81 MG tablet Take 1 tablet (81 mg total) by mouth daily. 30 tablet 0   cariprazine (VRAYLAR) 3 MG capsule Take 1 capsule (3 mg total) by mouth daily. 90 capsule 0   clopidogrel (PLAVIX) 75 MG tablet Take 1 tablet (75 mg total) by mouth daily. 90 tablet 2   cyclobenzaprine (FLEXERIL) 10 MG tablet Take 1 tablet (10 mg total) by mouth 3 (three) times daily as needed for muscle spasms. 90 tablet 2   DULoxetine (CYMBALTA) 60 MG capsule Take 1 capsule (60 mg total) by mouth 2 (two) times daily. 180 capsule 2   EPINEPHrine (EPIPEN 2-PAK) 0.3 mg/0.3 mL IJ SOAJ injection Inject 0.3 mg into the muscle as needed for anaphylaxis. 1 each 3   ezetimibe (ZETIA) 10 MG tablet Take 1 tablet (10 mg total) by mouth daily. 90 tablet 2   fexofenadine (ALLEGRA) 60 MG tablet Take 1 tablet (60 mg total) by mouth daily. 90 tablet 2   hydrochlorothiazide (HYDRODIURIL) 12.5 MG tablet Take 1 tablet (12.5 mg total) by mouth daily. 90 tablet 2  isosorbide mononitrate (IMDUR) 60 MG 24 hr tablet Take 1 tablet (60 mg total) by mouth daily. 90 tablet 2   lidocaine (XYLOCAINE) 2 % solution Use as directed 15 mLs in the mouth or throat every 6 (six) hours as needed for mouth pain. 100 mL 0   lithium carbonate (LITHOBID) 300 MG ER tablet Take 1 tablet (300 mg total) by mouth 2 (two) times daily. 180 tablet 0   LORazepam (ATIVAN) 1 MG tablet Take 1 tablet (1 mg total) by mouth every 8 (eight) hours as needed for anxiety. 90 tablet 1   meclizine (ANTIVERT) 25 MG tablet Take 1 tablet (25 mg total) by mouth 3 (three) times daily as needed for dizziness. 90 tablet 2   meloxicam (MOBIC) 15 MG tablet Take 15 mg by mouth daily.     methocarbamol (ROBAXIN) 750 MG tablet Take 750 mg by mouth 2 (two) times daily.     metoprolol succinate (TOPROL-XL) 50 MG 24 hr tablet  Take 1 tablet by mouth daily. Take with or immediately following a meal. 90 tablet 2   metoprolol tartrate (LOPRESSOR) 50 MG tablet      Multiple Vitamin (MULTIVITAMIN WITH MINERALS) TABS tablet Take 1 tablet by mouth daily. Men's Multivitamin     nitroGLYCERIN (NITROSTAT) 0.4 MG SL tablet Dissolve 1 tablet (0.4 mg total) under the tongue every 5 (five) minutes up to 3 doses as needed for chest pain. If no relief after 3 doses call 911. 25 tablet 1   ondansetron (ZOFRAN) 4 MG tablet Take 1 tablet (4 mg total) by mouth every 8 (eight) hours as needed for nausea or vomiting. 20 tablet 0   pantoprazole (PROTONIX) 40 MG tablet Take 1 tablet (40 mg total) by mouth daily. 90 tablet 2   predniSONE (DELTASONE) 20 MG tablet 1 po tid for 3 days then 1 po bid for 3 days then 1 po qd for 3 days 18 tablet 0   primidone (MYSOLINE) 50 MG tablet Take 1 tablet (50 mg total) by mouth in the morning and at bedtime. 180 tablet 0   ranolazine (RANEXA) 1000 MG SR tablet Take 1 tablet by mouth 2 times daily. 180 tablet 2   rosuvastatin (CRESTOR) 40 MG tablet Take 1 tablet (40 mg total) by mouth daily. 90 tablet 2   Semaglutide-Weight Management (WEGOVY) 2.4 MG/0.75ML SOAJ      valACYclovir (VALTREX) 1000 MG tablet Take 1 tablet (1,000 mg total) by mouth 2 (two) times daily. 20 tablet 0   [DISCONTINUED] diltiazem (CARDIZEM SR) 60 MG 12 hr capsule Take 1 capsule (60 mg total) by mouth 2 (two) times daily. 180 capsule 2   [DISCONTINUED] pantoprazole (PROTONIX) 40 MG tablet Take 1 tablet (40 mg total) by mouth daily. 30 tablet 3   [DISCONTINUED] triamterene-hydrochlorothiazide (DYAZIDE) 37.5-25 MG capsule Take 1 each (1 capsule total) by mouth daily. 90 capsule 2   No current facility-administered medications on file prior to visit.   Past Medical History:  Diagnosis Date   Abnormal stress test small area of mild ischemia inferior wall 05/24/2022   Abscess of buttock 11/23/2020   Bilateral leg edema 01/29/2020   Bipolar  1 disorder 02/04/2023   Chronic headaches 01/30/2021   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   Coronary artery disease involving coronary bypass graft of native heart with angina pectoris 05/22/2016   Cath 2013 LAD 95% stenosis, D1 90% stenosis, RCA 20%, Circ 30%.  Previous stent to D1 2012. EF 60%.   Dizziness 10/27/2020   Excessive daytime sleepiness 09/11/2016   Gastroesophageal reflux disease 02/04/2023   Generalized anxiety disorder 05/22/2016   Herpes zoster 01/20/2020   History of small bowel obstruction    Admitted 10-14-2007 for partical small bowel obstruction and N.G. tube was placed. Admitted by Dr Dimas Chyle. 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 Small internal hemorrhoids. Otherwise normal colonoscopy to terminal ileum   CORONARY ARTERY BYPASS GRAFT  02/19/2012   L - LAD, SVG - D1, SVG - OM   CORONARY STENT INTERVENTION N/A 11/05/2019   Procedure: CORONARY STENT INTERVENTION;  Surgeon: Runell Gess, MD;  Location: [facility];  Service: Cardiovascular;  Laterality: N/A;   SVG to DIAG   ESOPHAGOGASTRODUODENOSCOPY  01/08/2007   Multiple duodenal ulcers with stenosis/stricture of the distal first portion of the duodenum (measuring approximantely 9mm) Mild gastritis.   LEFT HEART CATH AND CORS/GRAFTS ANGIOGRAPHY N/A 11/05/2019   Procedure: LEFT HEART CATH AND CORS/GRAFTS ANGIOGRAPHY;  Surgeon: Runell Gess, MD;  Location: [facility];  Service: Cardiovascular;  Laterality: N/A;   RENAL ANGIOGRAPHY N/A 11/05/2019   Procedure: RENAL ANGIOGRAPHY;  Surgeon: Runell Gess, MD;  Location: [facility];  Service: Cardiovascular;  Laterality: N/A;   TONSILLECTOMY AND ADENOIDECTOMY     WRIST SURGERY      Family History  Problem Relation Age of Onset   Aneurysm Mother 50       cerebral   CAD Father 4   Heart disease Sister        Unclear   Healthy Brother    Dementia Other        several individuals on extended paternal side of family   Social History   Socioeconomic History   Marital status: Married    Spouse name: Not on file   Number of children: Not on file   Years of education: 16   Highest education level: Bachelor's degree (e.g., BA, AB, BS)  Occupational History   Occupation: Oncologist: [employer]  Tobacco Use   Smoking status: Never   Smokeless tobacco: Never  Vaping Use   Vaping status: Never Used  Substance and Sexual Activity   Alcohol use: Yes    Alcohol/week: 14.0 standard drinks of alcohol    Types: 14 Cans of beer per week    Comment: ~ 2 beers nightly   Drug use: Never   Sexual activity: Not on file  Other Topics Concern   Not on file  Social History Narrative   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        Right Handed   Lives in two story home    Social Determinants of Health   Financial Resource Strain: Low Risk  (06/04/2022)   Overall Financial Resource Strain (CARDIA)    Difficulty of Paying Living Expenses: Not hard at all   Food Insecurity: No Food Insecurity (06/04/2022)   Hunger Vital Sign    Worried About Running Out of Food in the Last Year: Never true    Ran Out of Food in the Last Year: Never true  Transportation Needs: No Transportation Needs (06/04/2022)   PRAPARE - Administrator, Civil Service (Medical): No    Lack of Transportation (Non-Medical): No  Physical Activity: Inactive (06/04/2022)   Exercise Vital Sign    Days of Exercise per Week: 0 days    Minutes of Exercise per Session: 0 min  Stress: No Stress Concern Present (06/04/2022)   [employer] of Occupational Health - Occupational Stress Questionnaire    Feeling of Stress : Not at all  Social Connections: Moderately Integrated (06/04/2022)   Social Connection and Isolation Panel [NHANES]    Frequency of Communication with Friends and Family: More than three times a week    Frequency of Social Gatherings with Friends and Family: More than three times a week    Attends Religious Services: Never    Database administrator or Organizations: Yes    Attends Engineer, structural: More than 4 times per year    Marital Status: Married    Objective:  BP 100/70   Pulse 78   Temp (!) 97.5 F (36.4 C)   Resp 14   Ht 5\' 10"  (1.778 m)   Wt 179 lb (81.2 kg)   SpO2 96%   BMI 25.68 kg/m      08/02/2023    8:39 AM 07/22/2023   10:41 AM 06/24/2023    1:38 PM  BP/Weight  Systolic BP 100 118 122  Diastolic BP 70 80 80  Wt. (Lbs) 179 177.6 176  BMI 25.68 kg/m2 25.48 kg/m2 25.25 kg/m2    Physical Exam Vitals reviewed.  Constitutional:      Appearance: Normal appearance.  Cardiovascular:     Rate and Rhythm: Normal rate and regular rhythm.     Heart sounds: Normal heart sounds.  Pulmonary:     Effort: Pulmonary effort is normal.     Breath sounds: Normal breath sounds.  Abdominal:     General: Bowel sounds are normal.     Palpations: Abdomen is soft.     Tenderness: There is no abdominal tenderness.   Musculoskeletal:     Cervical back: Normal.     Thoracic back: Normal.     Lumbar back: Swelling and tenderness present. Decreased range of motion.     Comments: Large muscle knots palpated along the belt line bilaterally.  Neurological:     Mental Status: He is alert and oriented to person, place, and time.  Psychiatric:        Mood and Affect: Mood normal.        Behavior: Behavior normal.     Diabetic Foot Exam - Simple   No data filed      Lab Results  Component Value Date   WBC 4.6 06/13/2023   HGB 13.8 06/13/2023   HCT 40.8 06/13/2023   PLT 139 (L) 06/13/2023   GLUCOSE 102 (H) 06/13/2023   CHOL 99 (L) 06/12/2023   TRIG 109 06/12/2023  HDL 41 06/12/2023   LDLCALC 38 06/12/2023   ALT 26 06/13/2023   AST 25 06/13/2023   NA 139 06/13/2023   K 3.8 06/13/2023   CL 104 06/13/2023   CREATININE 1.24 06/13/2023   BUN 12 06/13/2023   CO2 24 06/13/2023   TSH 0.650 06/12/2023   INR 1.1 06/12/2023      Assessment & Plan:    Chronic bilateral low back pain with right-sided sciatica Assessment & Plan: Performed manual muscle techniques to reduce adhesions Educated patient on stretching techniques to promote remission of adhesions Will continue to monitor Follow up with PT or massage therapy to help reduce severity of adhesions and reoccurrence.       No orders of the defined types were placed in this encounter.   No orders of the defined types were placed in this encounter.    Follow-up: No follow-ups on file.   I,Marla I Leal-Borjas,acting as a scribe for [employer], PA.,have documented all relevant documentation on the behalf of Langley Gauss, PA,as directed by  Langley Gauss, PA while in the presence of Langley Gauss, [place].   An After Visit Summary was printed and given to the patient.  Langley Gauss, [place] [facility] [phone]

## 2023-08-02 NOTE — Assessment & Plan Note (Signed)
Performed manual muscle techniques to reduce adhesions Educated patient on stretching techniques to promote remission of adhesions Will continue to monitor Follow up with PT or massage therapy to help reduce severity of adhesions and reoccurrence.

## 2023-08-08 ENCOUNTER — Other Ambulatory Visit: Payer: Self-pay | Admitting: Physician Assistant

## 2023-08-08 DIAGNOSIS — G8929 Other chronic pain: Secondary | ICD-10-CM

## 2023-08-09 ENCOUNTER — Other Ambulatory Visit (HOSPITAL_BASED_OUTPATIENT_CLINIC_OR_DEPARTMENT_OTHER): Payer: Self-pay | Admitting: Neurosurgery

## 2023-08-09 DIAGNOSIS — M5431 Sciatica, right side: Secondary | ICD-10-CM | POA: Diagnosis not present

## 2023-08-09 DIAGNOSIS — M5416 Radiculopathy, lumbar region: Secondary | ICD-10-CM | POA: Diagnosis not present

## 2023-08-09 DIAGNOSIS — I720 Aneurysm of carotid artery: Secondary | ICD-10-CM

## 2023-08-09 DIAGNOSIS — M47816 Spondylosis without myelopathy or radiculopathy, lumbar region: Secondary | ICD-10-CM | POA: Diagnosis not present

## 2023-08-10 ENCOUNTER — Ambulatory Visit (HOSPITAL_COMMUNITY)
Admission: RE | Admit: 2023-08-10 | Discharge: 2023-08-10 | Disposition: A | Discharge status: Home / Self Care | Payer: 59 | Source: Ambulatory Visit | Attending: Physician Assistant | Admitting: Physician Assistant

## 2023-08-10 ENCOUNTER — Other Ambulatory Visit (HOSPITAL_COMMUNITY): Payer: Self-pay

## 2023-08-10 ENCOUNTER — Ambulatory Visit (HOSPITAL_COMMUNITY)
Admission: RE | Admit: 2023-08-10 | Discharge: 2023-08-10 | Disposition: A | Discharge status: Home / Self Care | Payer: 59 | Source: Ambulatory Visit | Attending: Neurosurgery | Admitting: Neurosurgery

## 2023-08-10 DIAGNOSIS — I720 Aneurysm of carotid artery: Secondary | ICD-10-CM | POA: Diagnosis not present

## 2023-08-10 DIAGNOSIS — G8929 Other chronic pain: Secondary | ICD-10-CM | POA: Diagnosis not present

## 2023-08-10 DIAGNOSIS — M48061 Spinal stenosis, lumbar region without neurogenic claudication: Secondary | ICD-10-CM | POA: Diagnosis not present

## 2023-08-10 DIAGNOSIS — M5441 Lumbago with sciatica, right side: Secondary | ICD-10-CM | POA: Diagnosis not present

## 2023-08-10 DIAGNOSIS — I771 Stricture of artery: Secondary | ICD-10-CM | POA: Diagnosis not present

## 2023-08-10 DIAGNOSIS — M438X6 Other specified deforming dorsopathies, lumbar region: Secondary | ICD-10-CM | POA: Diagnosis not present

## 2023-08-10 DIAGNOSIS — I672 Cerebral atherosclerosis: Secondary | ICD-10-CM | POA: Diagnosis not present

## 2023-08-10 DIAGNOSIS — M5126 Other intervertebral disc displacement, lumbar region: Secondary | ICD-10-CM | POA: Diagnosis not present

## 2023-08-10 DIAGNOSIS — I6521 Occlusion and stenosis of right carotid artery: Secondary | ICD-10-CM | POA: Diagnosis not present

## 2023-08-10 DIAGNOSIS — M5127 Other intervertebral disc displacement, lumbosacral region: Secondary | ICD-10-CM | POA: Diagnosis not present

## 2023-08-10 MED ORDER — IOHEXOL 350 MG/ML SOLN
75.0000 mL | Freq: Once | INTRAVENOUS | Status: AC | PRN
  Administered 2023-08-10: 75 mL via INTRAVENOUS

## 2023-08-14 DIAGNOSIS — M461 Sacroiliitis, not elsewhere classified: Secondary | ICD-10-CM | POA: Diagnosis not present

## 2023-08-14 DIAGNOSIS — I720 Aneurysm of carotid artery: Secondary | ICD-10-CM | POA: Diagnosis not present

## 2023-08-22 ENCOUNTER — Ambulatory Visit: Payer: 59 | Admitting: Physician Assistant

## 2023-08-27 ENCOUNTER — Other Ambulatory Visit: Payer: Self-pay

## 2023-08-28 ENCOUNTER — Other Ambulatory Visit (HOSPITAL_COMMUNITY): Payer: Self-pay

## 2023-08-28 ENCOUNTER — Other Ambulatory Visit: Payer: Self-pay

## 2023-08-29 ENCOUNTER — Observation Stay (HOSPITAL_COMMUNITY)
Admission: EM | Admit: 2023-08-29 | Discharge: 2023-08-29 | Disposition: A | Discharge status: Home / Self Care | Payer: 59 | Attending: Internal Medicine | Admitting: Internal Medicine

## 2023-08-29 ENCOUNTER — Other Ambulatory Visit: Payer: Self-pay

## 2023-08-29 ENCOUNTER — Encounter (HOSPITAL_COMMUNITY)
Admission: EM | Disposition: A | Discharge status: Home / Self Care | Payer: Self-pay | Source: Home / Self Care | Attending: Emergency Medicine

## 2023-08-29 ENCOUNTER — Other Ambulatory Visit: Payer: Self-pay | Admitting: Physician Assistant

## 2023-08-29 ENCOUNTER — Ambulatory Visit (INDEPENDENT_AMBULATORY_CARE_PROVIDER_SITE_OTHER): Payer: 59 | Admitting: Physician Assistant

## 2023-08-29 ENCOUNTER — Ambulatory Visit: Payer: 59

## 2023-08-29 ENCOUNTER — Other Ambulatory Visit (HOSPITAL_COMMUNITY): Payer: Self-pay

## 2023-08-29 ENCOUNTER — Encounter: Payer: Self-pay | Admitting: Physician Assistant

## 2023-08-29 ENCOUNTER — Encounter (HOSPITAL_COMMUNITY): Payer: Self-pay | Admitting: Emergency Medicine

## 2023-08-29 ENCOUNTER — Other Ambulatory Visit (HOSPITAL_BASED_OUTPATIENT_CLINIC_OR_DEPARTMENT_OTHER): Payer: Self-pay

## 2023-08-29 ENCOUNTER — Emergency Department (HOSPITAL_COMMUNITY): Payer: 59

## 2023-08-29 VITALS — BP 100/70 | HR 92 | Temp 97.5°F | Resp 14 | Ht 70.0 in | Wt 184.0 lb

## 2023-08-29 VITALS — BP 104/74 | HR 88 | Ht 70.0 in | Wt 186.8 lb

## 2023-08-29 DIAGNOSIS — I200 Unstable angina: Principal | ICD-10-CM | POA: Diagnosis present

## 2023-08-29 DIAGNOSIS — I25110 Atherosclerotic heart disease of native coronary artery with unstable angina pectoris: Secondary | ICD-10-CM | POA: Diagnosis not present

## 2023-08-29 DIAGNOSIS — Z7982 Long term (current) use of aspirin: Secondary | ICD-10-CM | POA: Diagnosis not present

## 2023-08-29 DIAGNOSIS — Z955 Presence of coronary angioplasty implant and graft: Secondary | ICD-10-CM | POA: Diagnosis not present

## 2023-08-29 DIAGNOSIS — I10 Essential (primary) hypertension: Secondary | ICD-10-CM | POA: Diagnosis present

## 2023-08-29 DIAGNOSIS — Z8673 Personal history of transient ischemic attack (TIA), and cerebral infarction without residual deficits: Secondary | ICD-10-CM | POA: Diagnosis not present

## 2023-08-29 DIAGNOSIS — Z8719 Personal history of other diseases of the digestive system: Secondary | ICD-10-CM | POA: Insufficient documentation

## 2023-08-29 DIAGNOSIS — K219 Gastro-esophageal reflux disease without esophagitis: Secondary | ICD-10-CM

## 2023-08-29 DIAGNOSIS — E7800 Pure hypercholesterolemia, unspecified: Secondary | ICD-10-CM | POA: Diagnosis present

## 2023-08-29 DIAGNOSIS — Z951 Presence of aortocoronary bypass graft: Secondary | ICD-10-CM | POA: Insufficient documentation

## 2023-08-29 DIAGNOSIS — M5441 Lumbago with sciatica, right side: Secondary | ICD-10-CM

## 2023-08-29 DIAGNOSIS — G8929 Other chronic pain: Secondary | ICD-10-CM | POA: Diagnosis not present

## 2023-08-29 DIAGNOSIS — M545 Low back pain, unspecified: Secondary | ICD-10-CM

## 2023-08-29 DIAGNOSIS — Z79899 Other long term (current) drug therapy: Secondary | ICD-10-CM | POA: Diagnosis not present

## 2023-08-29 DIAGNOSIS — R079 Chest pain, unspecified: Secondary | ICD-10-CM | POA: Diagnosis not present

## 2023-08-29 DIAGNOSIS — I25708 Atherosclerosis of coronary artery bypass graft(s), unspecified, with other forms of angina pectoris: Secondary | ICD-10-CM | POA: Diagnosis not present

## 2023-08-29 DIAGNOSIS — M461 Sacroiliitis, not elsewhere classified: Secondary | ICD-10-CM | POA: Insufficient documentation

## 2023-08-29 DIAGNOSIS — Z7902 Long term (current) use of antithrombotics/antiplatelets: Secondary | ICD-10-CM | POA: Insufficient documentation

## 2023-08-29 HISTORY — PX: LEFT HEART CATH AND CORS/GRAFTS ANGIOGRAPHY: CATH118250

## 2023-08-29 HISTORY — DX: Sacroiliitis, not elsewhere classified: M46.1

## 2023-08-29 LAB — BASIC METABOLIC PANEL
Anion gap: 7 (ref 5–15)
BUN: 19 mg/dL (ref 8–23)
CO2: 27 mmol/L (ref 22–32)
Calcium: 8.4 mg/dL — ABNORMAL LOW (ref 8.9–10.3)
Chloride: 106 mmol/L (ref 98–111)
Creatinine, Ser: 1 mg/dL (ref 0.61–1.24)
GFR, Estimated: >60 mL/min (ref >60)
Glucose, Bld: 75 mg/dL (ref 70–99)
Potassium: 3.9 mmol/L (ref 3.5–5.1)
Sodium: 140 mmol/L (ref 135–145)

## 2023-08-29 LAB — CBC
HCT: 40 % (ref 39.0–52.0)
Hemoglobin: 13.3 g/dL (ref 13.0–17.0)
MCH: 34.1 pg — ABNORMAL HIGH (ref 26.0–34.0)
MCHC: 33.3 g/dL (ref 30.0–36.0)
MCV: 102.6 fL — ABNORMAL HIGH (ref 80.0–100.0)
Platelets: 159 10*3/uL (ref 150–400)
RBC: 3.9 MIL/uL — ABNORMAL LOW (ref 4.22–5.81)
RDW: 14.1 % (ref 11.5–15.5)
WBC: 9.5 10*3/uL (ref 4.0–10.5)
nRBC: 0 % (ref 0.0–0.2)

## 2023-08-29 LAB — TROPONIN I (HIGH SENSITIVITY)
Troponin I (High Sensitivity): 5 ng/L (ref <18)
Troponin I (High Sensitivity): 4 ng/L (ref <18)

## 2023-08-29 SURGERY — LEFT HEART CATH AND CORS/GRAFTS ANGIOGRAPHY
Anesthesia: LOCAL

## 2023-08-29 MED ORDER — SODIUM CHLORIDE 0.9 % IV SOLN: INTRAVENOUS | Status: AC

## 2023-08-29 MED ORDER — RANOLAZINE ER 1000 MG PO TB12
1000.0000 mg | ORAL_TABLET | Freq: Two times a day (BID) | ORAL | 2 refills | Status: DC
  Filled 2023-08-29: qty 180, 90d supply, fill #0
  Filled 2023-11-14 – 2023-12-05 (×3): qty 180, 90d supply, fill #1
  Filled 2023-12-19 – 2024-02-17 (×9): qty 180, 90d supply, fill #2
  Filled 2024-02-17 (×2): qty 50, 25d supply, fill #2

## 2023-08-29 MED ORDER — SODIUM CHLORIDE 0.9 % IV SOLN: 250.0000 mL | INTRAVENOUS | Status: DC | PRN

## 2023-08-29 MED ORDER — EZETIMIBE 10 MG PO TABS: 10.0000 mg | ORAL_TABLET | Freq: Every day | ORAL | Status: DC

## 2023-08-29 MED ORDER — ASPIRIN 81 MG PO TBEC
81.0000 mg | DELAYED_RELEASE_TABLET | Freq: Every day | ORAL | 2 refills | Status: DC
  Filled 2023-08-29 – 2023-11-28 (×2): qty 90, 90d supply, fill #0
  Filled 2023-12-19 – 2024-02-03 (×9): qty 90, 90d supply, fill #1
  Filled 2024-02-10: qty 30, 30d supply, fill #1
  Filled 2024-02-24 – 2024-03-16 (×3): qty 30, 30d supply, fill #2
  Filled 2024-04-09: qty 30, 30d supply, fill #3
  Filled ????-??-??: fill #1

## 2023-08-29 MED ORDER — ONDANSETRON HCL 4 MG/2ML IJ SOLN: 4.0000 mg | Freq: Four times a day (QID) | INTRAMUSCULAR | Status: DC | PRN

## 2023-08-29 MED ORDER — ISOSORBIDE MONONITRATE ER 60 MG PO TB24
60.0000 mg | ORAL_TABLET | Freq: Every day | ORAL | 1 refills | Status: DC
  Filled 2023-08-29: qty 90, 90d supply, fill #0

## 2023-08-29 MED ORDER — CARIPRAZINE HCL 3 MG PO CAPS: 3.0000 mg | ORAL_CAPSULE | Freq: Every day | ORAL | Status: DC

## 2023-08-29 MED ORDER — HEPARIN SODIUM (PORCINE) 1000 UNIT/ML IJ SOLN
INTRAMUSCULAR | Status: DC | PRN
  Administered 2023-08-29: 4000 [IU] via INTRAVENOUS

## 2023-08-29 MED ORDER — ROSUVASTATIN CALCIUM 40 MG PO TABS
40.0000 mg | ORAL_TABLET | Freq: Every day | ORAL | 1 refills | Status: DC
  Filled 2023-08-29: qty 90, 90d supply, fill #0
  Filled 2023-11-28: qty 90, 90d supply, fill #1

## 2023-08-29 MED ORDER — LABETALOL HCL 5 MG/ML IV SOLN: 10.0000 mg | INTRAVENOUS | Status: DC | PRN

## 2023-08-29 MED ORDER — LITHIUM CARBONATE ER 300 MG PO TBCR: 300.0000 mg | EXTENDED_RELEASE_TABLET | Freq: Two times a day (BID) | ORAL | Status: DC

## 2023-08-29 MED ORDER — CLOPIDOGREL BISULFATE 75 MG PO TABS
75.0000 mg | ORAL_TABLET | Freq: Every day | ORAL | 2 refills | Status: DC
  Filled 2023-08-29: qty 90, 90d supply, fill #0

## 2023-08-29 MED ORDER — ACETAMINOPHEN 325 MG PO TABS: 650.0000 mg | ORAL_TABLET | ORAL | Status: DC | PRN

## 2023-08-29 MED ORDER — VERAPAMIL HCL 2.5 MG/ML IV SOLN
INTRAVENOUS | Status: AC
Start: 2023-08-29 — End: ?
  Filled 2023-08-29: qty 2

## 2023-08-29 MED ORDER — DULOXETINE HCL 30 MG PO CPEP: 60.0000 mg | ORAL_CAPSULE | Freq: Two times a day (BID) | ORAL | Status: DC

## 2023-08-29 MED ORDER — SODIUM CHLORIDE 0.9% FLUSH: 3.0000 mL | Freq: Two times a day (BID) | INTRAVENOUS | Status: DC

## 2023-08-29 MED ORDER — ENOXAPARIN SODIUM 40 MG/0.4ML IJ SOSY: 40.0000 mg | PREFILLED_SYRINGE | INTRAMUSCULAR | Status: DC

## 2023-08-29 MED ORDER — HEPARIN SODIUM (PORCINE) 1000 UNIT/ML IJ SOLN
INTRAMUSCULAR | Status: AC
  Filled 2023-08-29: qty 10

## 2023-08-29 MED ORDER — LIDOCAINE HCL (PF) 1 % IJ SOLN
INTRAMUSCULAR | Status: AC
  Filled 2023-08-29: qty 30

## 2023-08-29 MED ORDER — HEPARIN (PORCINE) IN NACL 1000-0.9 UT/500ML-% IV SOLN
INTRAVENOUS | Status: DC | PRN
  Administered 2023-08-29 (×2): 500 mL

## 2023-08-29 MED ORDER — CLOPIDOGREL BISULFATE 75 MG PO TABS
75.0000 mg | ORAL_TABLET | Freq: Every day | ORAL | 1 refills | Status: DC
  Filled 2023-08-29: qty 90, 90d supply, fill #0
  Filled 2023-11-28: qty 90, 90d supply, fill #1

## 2023-08-29 MED ORDER — SODIUM CHLORIDE 0.9 % IV BOLUS: 1000.0000 mL | Freq: Once | INTRAVENOUS | Status: DC

## 2023-08-29 MED ORDER — SODIUM CHLORIDE 0.9 % IV BOLUS
500.0000 mL | Freq: Once | INTRAVENOUS | Status: AC
  Administered 2023-08-29: 500 mL via INTRAVENOUS

## 2023-08-29 MED ORDER — HYDRALAZINE HCL 20 MG/ML IJ SOLN: 10.0000 mg | INTRAMUSCULAR | Status: DC | PRN

## 2023-08-29 MED ORDER — METOPROLOL SUCCINATE ER 25 MG PO TB24: 50.0000 mg | ORAL_TABLET | Freq: Every day | ORAL | Status: DC

## 2023-08-29 MED ORDER — IOHEXOL 350 MG/ML SOLN
INTRAVENOUS | Status: DC | PRN
  Administered 2023-08-29: 78 mL

## 2023-08-29 MED ORDER — NITROGLYCERIN 0.4 MG SL SUBL: 0.4000 mg | SUBLINGUAL_TABLET | SUBLINGUAL | Status: DC | PRN

## 2023-08-29 MED ORDER — MIDAZOLAM HCL 2 MG/2ML IJ SOLN
INTRAMUSCULAR | Status: DC | PRN
  Administered 2023-08-29: 1 mg via INTRAVENOUS

## 2023-08-29 MED ORDER — CLOPIDOGREL BISULFATE 75 MG PO TABS: 75.0000 mg | ORAL_TABLET | Freq: Every day | ORAL | Status: DC

## 2023-08-29 MED ORDER — PANTOPRAZOLE SODIUM 40 MG PO TBEC
40.0000 mg | DELAYED_RELEASE_TABLET | Freq: Every day | ORAL | 1 refills | Status: DC
  Filled 2023-08-29: qty 90, 90d supply, fill #0
  Filled 2023-11-14: qty 90, 90d supply, fill #1

## 2023-08-29 MED ORDER — SODIUM CHLORIDE 0.9% FLUSH: 3.0000 mL | INTRAVENOUS | Status: DC | PRN

## 2023-08-29 MED ORDER — VERAPAMIL HCL 2.5 MG/ML IV SOLN
INTRAVENOUS | Status: DC | PRN
  Administered 2023-08-29: 10 mL via INTRA_ARTERIAL

## 2023-08-29 MED ORDER — RANOLAZINE ER 500 MG PO TB12: 1000.0000 mg | ORAL_TABLET | Freq: Two times a day (BID) | ORAL | Status: DC

## 2023-08-29 MED ORDER — LIDOCAINE HCL (PF) 1 % IJ SOLN
INTRAMUSCULAR | Status: DC | PRN
  Administered 2023-08-29 (×2): 5 mL via INTRADERMAL

## 2023-08-29 MED ORDER — HYDROCHLOROTHIAZIDE 12.5 MG PO TABS: 12.5000 mg | ORAL_TABLET | Freq: Every day | ORAL | Status: DC

## 2023-08-29 MED ORDER — ROSUVASTATIN CALCIUM 40 MG PO TABS
40.0000 mg | ORAL_TABLET | Freq: Every day | ORAL | 2 refills | Status: DC
  Filled 2023-08-29: qty 90, 90d supply, fill #0

## 2023-08-29 MED ORDER — CYCLOBENZAPRINE HCL 10 MG PO TABS
10.0000 mg | ORAL_TABLET | Freq: Three times a day (TID) | ORAL | 1 refills | Status: DC | PRN
  Filled 2023-08-29: qty 90, 30d supply, fill #0

## 2023-08-29 MED ORDER — MIDAZOLAM HCL 2 MG/2ML IJ SOLN
INTRAMUSCULAR | Status: AC
  Filled 2023-08-29: qty 2

## 2023-08-29 MED ORDER — ROSUVASTATIN CALCIUM 20 MG PO TABS: 40.0000 mg | ORAL_TABLET | Freq: Every day | ORAL | Status: DC

## 2023-08-29 MED ORDER — PANTOPRAZOLE SODIUM 40 MG PO TBEC
40.0000 mg | DELAYED_RELEASE_TABLET | Freq: Every day | ORAL | 2 refills | Status: DC
  Filled 2023-08-29: qty 90, 90d supply, fill #0

## 2023-08-29 MED ORDER — FENTANYL CITRATE (PF) 100 MCG/2ML IJ SOLN
INTRAMUSCULAR | Status: AC
  Filled 2023-08-29: qty 2

## 2023-08-29 MED ORDER — ASPIRIN 81 MG PO TBEC: 81.0000 mg | DELAYED_RELEASE_TABLET | Freq: Every day | ORAL | Status: DC

## 2023-08-29 MED ORDER — FENTANYL CITRATE (PF) 100 MCG/2ML IJ SOLN
INTRAMUSCULAR | Status: DC | PRN
  Administered 2023-08-29: 25 ug via INTRAVENOUS

## 2023-08-29 MED ORDER — ISOSORBIDE MONONITRATE ER 30 MG PO TB24: 60.0000 mg | ORAL_TABLET | Freq: Every day | ORAL | Status: DC

## 2023-08-29 SURGICAL SUPPLY — 14 items
CATH EXPO 5F MPA-1 (CATHETERS) IMPLANT
CATH INFINITI 5 FR LCB (CATHETERS) IMPLANT
CATH INFINITI 5FR AL1 (CATHETERS) IMPLANT
CATH INFINITI 5FR MULTPACK ANG (CATHETERS) IMPLANT
CLOSURE MYNX CONTROL 5F (Vascular Products) IMPLANT
DEVICE RAD COMP TR BAND LRG (VASCULAR PRODUCTS) IMPLANT
GLIDESHEATH SLEND SS 6F .021 (SHEATH) IMPLANT
GUIDEWIRE INQWIRE 1.5J.035X260 (WIRE) IMPLANT
INQWIRE 1.5J .035X260CM (WIRE) ×1
KIT MICROPUNCTURE NIT STIFF (SHEATH) IMPLANT
PACK CARDIAC CATHETERIZATION (CUSTOM PROCEDURE TRAY) ×1 IMPLANT
SET ATX-X65L (MISCELLANEOUS) IMPLANT
SHEATH PINNACLE 5F 10CM (SHEATH) IMPLANT
SHEATH PROBE COVER 6X72 (BAG) IMPLANT

## 2023-08-29 NOTE — Assessment & Plan Note (Signed)
Will send for Troponin  No major abnormalities in ECG Will send to ER if Troponin is elevated

## 2023-08-29 NOTE — ED Provider Notes (Signed)
EMERGENCY DEPARTMENT AT [facility] Provider Note   CSN: [id number] Arrival date & time: 08/29/23  1015     History  Chief Complaint  Patient presents with   Chest Pain    Jon Gonzales is a 62 y.o. male.  Jon Gonzales is a 62 y.o. male with a history of coronary artery disease s/p CABG and PCI x6, hypertension, hyperlipidemia, who presents to the ED from cardiology office for further evaluation of chest pain.  Patient reports that since last night he has had 3 episodes of central chest pain radiating into the upper chest that feels similar to his prior MIs.  He has taken nitroglycerin 3 times with resolution of pain.  He reports pain will come on suddenly, happens at rest as well as with exertion.  Patient is currently chest pain-free.  Denies diaphoresis, nausea, vomiting, shortness of breath or lower extremity swelling.  Patient's wife is at bedside and aids in providing history.  Patient seen by Dr. Cannon Kettle this morning and sent over for further evaluation and possible cath.  The history is provided by the patient, the spouse and medical records.  Chest Pain Associated symptoms: no abdominal pain, no cough, no fever, no nausea, no shortness of breath and no vomiting        Home Medications Prior to Admission medications   Medication Sig Start Date End Date Taking? Authorizing Provider  aspirin EC 81 MG tablet Take 1 tablet (81 mg total) by mouth daily. 11/05/19   Runell Gess, MD  cariprazine (VRAYLAR) 3 MG capsule Take 1 capsule (3 mg total) by mouth daily. 07/29/23   Cottle, Steva Ready., MD  clopidogrel (PLAVIX) 75 MG tablet Take 1 tablet (75 mg total) by mouth daily. 10/03/22   [name], MD  cyclobenzaprine (FLEXERIL) 10 MG tablet Take 1 tablet (10 mg total) by mouth 3 (three) times daily as needed for muscle spasms. 10/03/22   [name], MD  DULoxetine (CYMBALTA) 60 MG capsule Take 1 capsule (60 mg total) by mouth 2  (two) times daily. 07/29/23   Cottle, Steva Ready., MD  EPINEPHrine (EPIPEN 2-PAK) 0.3 mg/0.3 mL IJ SOAJ injection Inject 0.3 mg into the muscle as needed for anaphylaxis. 09/22/22   [name], MD  ezetimibe (ZETIA) 10 MG tablet Take 1 tablet (10 mg total) by mouth daily. 10/03/22   [name], MD  fexofenadine (ALLEGRA) 60 MG tablet Take 1 tablet (60 mg total) by mouth daily. 10/03/22   [name], MD  hydrochlorothiazide (HYDRODIURIL) 12.5 MG tablet Take 1 tablet (12.5 mg total) by mouth daily. 10/03/22   [name], MD  isosorbide mononitrate (IMDUR) 60 MG 24 hr tablet Take 1 tablet (60 mg total) by mouth daily. 10/03/22   [name], MD  lithium carbonate (LITHOBID) 300 MG ER tablet Take 1 tablet (300 mg total) by mouth 2 (two) times daily. 07/29/23   Cottle, Steva Ready., MD  LORazepam (ATIVAN) 1 MG tablet Take 1 tablet (1 mg total) by mouth every 8 (eight) hours as needed for anxiety. 07/29/23   Cottle, Steva Ready., MD  meclizine (ANTIVERT) 25 MG tablet Take 1 tablet (25 mg total) by mouth 3 (three) times daily as needed for dizziness. 10/03/22   [name], MD  metoprolol succinate (TOPROL-XL) 50 MG 24 hr tablet Take 1 tablet by mouth daily. Take with or immediately following a meal. 10/03/22   Brent Bulla  Ramon Dredge, MD  Multiple Vitamin (MULTIVITAMIN WITH MINERALS) TABS tablet Take 1 tablet by mouth daily. Men's Multivitamin    [provider]  nitroGLYCERIN (NITROSTAT) 0.4 MG SL tablet Dissolve 1 tablet (0.4 mg total) under the tongue every 5 (five) minutes up to 3 doses as needed for chest pain. If no relief after 3 doses call 911. 09/22/22   [name], MD  ondansetron (ZOFRAN) 4 MG tablet Take 1 tablet (4 mg total) by mouth every 8 (eight) hours as needed for nausea or vomiting. 10/13/22   Janie Morning, NP  pantoprazole (PROTONIX) 40 MG tablet Take 1 tablet (40 mg total) by mouth daily. 10/03/22    [name], MD  ranolazine (RANEXA) 1000 MG SR tablet Take 1 tablet by mouth 2 times daily. 10/03/22   [name], MD  rosuvastatin (CRESTOR) 40 MG tablet Take 1 tablet (40 mg total) by mouth daily. 10/03/22   [name], MD  Semaglutide-Weight Management (WEGOVY) 2.4 MG/0.75ML SOAJ Inject 2 mg into the skin once a week.    [provider]  diltiazem (CARDIZEM SR) 60 MG 12 hr capsule Take 1 capsule (60 mg total) by mouth 2 (two) times daily. 05/02/20 11/07/20  [name], MD  pantoprazole (PROTONIX) 40 MG tablet Take 1 tablet (40 mg total) by mouth daily. 01/17/22   Janie Morning, NP  triamterene-hydrochlorothiazide (DYAZIDE) 37.5-25 MG capsule Take 1 each (1 capsule total) by mouth daily. 08/08/20 10/25/20  [name], MD      Allergies    Trintellix [vortioxetine] and Penicillins    Review of Systems   Review of Systems  Constitutional:  Negative for chills and fever.  Respiratory:  Negative for cough and shortness of breath.   Cardiovascular:  Positive for chest pain. Negative for leg swelling.  Gastrointestinal:  Negative for abdominal pain, nausea and vomiting.  Neurological:  Negative for syncope and light-headedness.    Physical Exam Updated Vital Signs BP 108/80 (BP Location: Right Arm)   Pulse 92   Temp 97.8 F (36.6 C) (Oral)   Resp 16   SpO2 98%  Physical Exam Vitals and nursing note reviewed.  Constitutional:      General: He is not in acute distress.    Appearance: Normal appearance. He is well-developed. He is not ill-appearing or diaphoretic.  HENT:     Head: Normocephalic and atraumatic.  Eyes:     General:        Right eye: No discharge.        Left eye: No discharge.     Pupils: Pupils are equal, round, and reactive to light.  Cardiovascular:     Rate and Rhythm: Normal rate and regular rhythm.     Pulses: Normal pulses.          Radial pulses are 2+ on the right side and 2+ on the left  side.     Heart sounds: Normal heart sounds. No murmur heard.    No friction rub. No gallop.  Pulmonary:     Effort: Pulmonary effort is normal. No respiratory distress.     Breath sounds: Normal breath sounds. No wheezing or rales.     Comments: Respirations equal and unlabored, patient able to speak in full sentences, lungs clear to auscultation bilaterally  Abdominal:     General: Bowel sounds are normal. There is no distension.     Palpations: Abdomen is soft. There is no mass.     Tenderness:  There is no abdominal tenderness. There is no guarding.     Comments: Abdomen soft, nondistended, nontender to palpation in all quadrants without guarding or peritoneal signs  Musculoskeletal:        General: No deformity.     Cervical back: Neck supple.     Right lower leg: No tenderness. No edema.     Left lower leg: No tenderness. No edema.  Skin:    General: Skin is warm and dry.     Capillary Refill: Capillary refill takes less than 2 seconds.  Neurological:     Mental Status: He is alert and oriented to person, place, and time.     Coordination: Coordination normal.     Comments: Speech is clear, able to follow commands Moves extremities without ataxia, coordination intact  Psychiatric:        Mood and Affect: Mood normal.        Behavior: Behavior normal.     ED Results / Procedures / Treatments   Labs (all labs ordered are listed, but only abnormal results are displayed) Labs Reviewed  BASIC METABOLIC PANEL - Abnormal; Notable for the following components:      Result Value   Calcium 8.4 (*)    All other components within normal limits  CBC - Abnormal; Notable for the following components:   RBC 3.90 (*)    MCV 102.6 (*)    MCH 34.1 (*)    All other components within normal limits  TROPONIN I (HIGH SENSITIVITY)  TROPONIN I (HIGH SENSITIVITY)    EKG None  Radiology   DG Chest 2 View  Result Date: 08/29/2023 CLINICAL DATA:  Chest pain beginning last night.  Coronary artery disease. EXAM: CHEST - 2 VIEW COMPARISON:  10/26/2019 FINDINGS: The heart size and mediastinal contours are within normal limits. Prior CABG again noted. Both lungs are clear. The visualized skeletal structures are unremarkable. IMPRESSION: No active cardiopulmonary disease. Electronically Signed   By: Danae Orleans M.D.   On: 08/29/2023 13:38    Procedures Procedures    Medications Ordered in ED Medications - No data to display  ED Course/ Medical Decision Making/ A&P                                Medical Decision Making Amount and/or Complexity of Data Reviewed Labs: ordered. Radiology: ordered.  Risk Decision regarding hospitalization.   62 year old male presents from cardiology office for evaluation of 3 episodes of chest pain relieved with nitro, sent in for concern for unstable angina, no associated symptoms with chest pain, extensive history of coronary artery disease.  Workup initiated with EKG, basic labs, troponins and chest x-ray.  Currently patient is chest pain-free  Notify cardiology service of patient's arrival.  Initial troponins and lab evaluation has been reassuring and twelve-lead EKG without signs of STEMI  Chest x-ray independently viewed and interpreted without acute cardiopulmonary disease.  Dr. Rennis Golden with cardiology service at bedside, will plan to admit to cardiology service for catheterization        Final Clinical Impression(s) / ED Diagnoses Final diagnoses:  Unstable angina [facility])    Rx / DC Orders ED Discharge Orders     None         Ward Chatters 08/30/23 2206    Terald Sleeper, MD 08/31/23 [phone]

## 2023-08-29 NOTE — Progress Notes (Signed)
TR BAND REMOVAL  LOCATION:    left radial  DEFLATED PER PROTOCOL:    Yes.    TIME BAND OFF / DRESSING APPLIED:    1820 gauze dressing applied   SITE UPON ARRIVAL:    Level 0  SITE AFTER BAND REMOVAL:    Level 0  CIRCULATION SENSATION AND MOVEMENT:    Within Normal Limits   Yes.    COMMENTS:   no new issues noted

## 2023-08-29 NOTE — ED Triage Notes (Signed)
Pt arrives POV with reports of chest pain that started last night. Pt reports in total he has taken 3 nitroglycerin with resolve of chest pain. Pt with hx of 6 stents. Reports cardiology sent here for evaluation.

## 2023-08-29 NOTE — Interval H&P Note (Signed)
History and Physical Interval Note:  08/29/2023 2:30 PM  Jon Gonzales  has presented today for surgery, with the diagnosis of [place].  The various methods of treatment have been discussed with the patient and family. After consideration of risks, benefits and other options for treatment, the patient has consented to  Procedure(s): LEFT HEART CATH AND CORONARY ANGIOGRAPHY (N/A) as a surgical intervention.  The patient's history has been reviewed, patient examined, no change in status, stable for surgery.  I have reviewed the patient's chart and labs.  Questions were answered to the patient's satisfaction.    Cath Lab Visit (complete for each Cath Lab visit)  Clinical Evaluation Leading to the Procedure:   ACS: No.  Non-ACS:    Anginal Classification: CCS III  Anti-ischemic medical therapy: Maximal Therapy (2 or more classes of medications)  Non-Invasive Test Results: No non-invasive testing performed  Prior CABG: Previous CABG        Verne Carrow

## 2023-08-29 NOTE — Progress Notes (Signed)
Pt ambulated in hall ~100 ft with no signs os oozing from right groin site

## 2023-08-29 NOTE — Assessment & Plan Note (Signed)
Continues to have back pain Is going to get injection in December with orthopedics Continue to monitor symptoms

## 2023-08-29 NOTE — Patient Instructions (Signed)
Medication Instructions:  Your physician recommends that you continue on your current medications as directed. Please refer to the Current Medication list given to you today.  *If you need a refill on your cardiac medications before your next appointment, please call your pharmacy*   Lab Work: None If you have labs (blood work) drawn today and your tests are completely normal, you will receive your results only by: MyChart Message (if you have MyChart) OR A paper copy in the mail If you have any lab test that is abnormal or we need to change your treatment, we will call you to review the results.   Testing/Procedures: None   Follow-Up: At [facility], you and your health needs are our priority.  As part of our continuing mission to provide you with exceptional heart care, we have created designated Provider Care Teams.  These Care Teams include your primary Cardiologist (physician) and Advanced Practice Providers (APPs -  Physician Assistants and Nurse Practitioners) who all work together to provide you with the care you need, when you need it.  We recommend signing up for the patient portal called "MyChart".  Sign up information is provided on this After Visit Summary.  MyChart is used to connect with patients for Virtual Visits (Telemedicine).  Patients are able to view lab/test results, encounter notes, upcoming appointments, etc.  Non-urgent messages can be sent to your provider as well.   To learn more about what you can do with MyChart, go to [email or web address].    Your next appointment:   Follow up will be determined after ER visit  Provider:   Huntley Dec, MD    Other Instructions None

## 2023-08-29 NOTE — Discharge Summary (Signed)
Discharge Summary    Patient ID: Jon Gonzales MRN: [id number]; DOB: 11-03-1960  Admit date: 08/29/2023 Discharge date: 08/29/2023  PCP:  [name], PA-C   [facility] Providers Cardiologist:  Marlyn Corporal Madireddy, MD     Discharge Diagnoses    Principal Problem:   Unstable angina [facility]) Active Problems:   Hypercholesterolemia   Hypertension  Diagnostic Studies/Procedures    Cath: 08/29/2023    Ost LAD to Prox LAD lesion is 80% stenosed.   Mid LAD lesion is 80% stenosed.   1st Diag lesion is 90% stenosed.   Prox RCA to Mid RCA lesion is 100% stenosed.   Prox Cx lesion is 80% stenosed.   Previously placed Origin to Prox Graft stent of unknown type is  widely patent.   Non-stenotic Mid Cx lesion was previously treated.   LIMA graft was visualized by angiography.   SVG graft was visualized by angiography.   SVG graft was visualized by angiography.   The left ventricular systolic function is normal.   LV end diastolic pressure is normal.   The left ventricular ejection fraction is 50-55% by visual estimate.   Severe stenosis proximal and mid LAD. Patent LIMA graft to the mid LAD. Severe stenosis in the ostium of the small to moderate caliber Diagonal branch. Patent vein graft to the Diagonal branch with patent stent in the ostium of the Diagonal branch. The stent appears to extend into the aorta.  Severe stenosis in the mid Circumflex artery. Patent vein graft to the obtuse marginal branch.  Large dominant RCA with chronic occlusion of the proximal vessel. The distal vessel and branches fill from left to right collaterals.  Normal LV systolic function. LVEDP 11 mmHg.    Recommendations: The RCA occlusion is new since his cardiac cath in 2021. This vessel has brisk collateral filling. His CAD is otherwise stable. No targets for PCI. Continue medical management of CAD. He will resume his Ranexa as his angina started when he stopped his Ranexa this week. Discharge  home tonight after bedrest.    Diagnostic Dominance: Right  _____________   History of Present Illness     Jon Gonzales is a 62 y.o. male with a past medical history of hypertension, hyperlipidemia, mild mitral regurgitation, CAD with multiple PCI's in the past, s/p CABG in 2012 who was seen 08/29/2023 for the evaluation of chest pain.   Per chart review, patient previously underwent CABG in 2012 (LIMA-LAD, SVG-diagonal, SVG-OM). Later underwent cardiac catheterization in 10/2019 for unstable angina. Catheterization showed patent grafts with a significant lesion at the origin of the SVG-diagonal. Treated with DES to the SVG-diagonal. Later, patient wore a cardiac monitor in 11/2020 that showed predominantly normal sinus rhythm with rare PVCs, PACs. No arrhythmias noted. Echocardiogram in 11/2020 showed EF 60-65%, no regional wall motion abnormalities, normal RV function, mild MR.    Patient recently underwent nuclear stress test on 03/22/22 that showed findings consistent with ischemia, but was overall low risk. There was a small defect with mild reduction in uptake present in the inferior location with normal wall motion. Patient was started on imdur. He was last seen by Dr. Bing Matter on 03/28/23. At that time, patient was doing well.    Patient was seen by Dr. Vincent Gros in clinic this morning. At his appointment, patient reported having 3 episodes of chest discomfort since the night prior. Chest pain improved with sl nitroglycerin. Reported that chest pain was similar to the episodes he had before PCI  in 2021.    On interview, patient reports that he has been having intermittent episodes of chest pain for the past 3 weeks or so. Initially, episodes were infrequent and occurred a few days apart. However, episodes began to occur more frequently, and he has had 3 episodes of pain the past 2 days. Pain occurs both at rest and on exertion. Feels very similar to when he has needed PCI in the past. He also  has felt much more fatigued than usual, and gets tired easier on exertion than he normally does. Denies shortness of breath, cough, nasal congestion, fever, chills, body aches, nausea. Denies palpitations, ankle edema. In the ED, he is feeling well. No current chest pain     Hospital Course     Unstable Angina CAD  -- Patient has had multiple PCIs in the past. Then underwent CABG in 2012 with LIMA-LAD, SVG-diagonal, SVG-OM. In 10/2019, patient underwent cardiac catheterization that revealed a significant lesion at the origin of the SVG-diagonal that was treated with DES -- Most recent ischemic evaluation was a nuclear stress test in 03/2022 that showed a small area of infarction, but was overall low risk -- High-sensitivity troponin negative x 1.  Given concern for unstable angina he was taken to the cardiac Cath Lab with severe stenosis of proximal/mid LAD, patent LIMA graft to mid LAD severe stenosis of small caliber diagonal branch.  Patent vein graft to diagonal branch with patent stent in the ostium of the diagonal branch which appeared to extend into the aorta.  Large dominant RCA occlusion of the proximal vessels which feel from left-to-right collaterals.  RCA occlusion was new since cardiac catheterization in 2021 but brisk collateral filling.  No targets for PCI with recommendations for medical management. -- Continue ASA, plavix, Crestor, Zetia, metoprolol 50 mg daily, Imdur 60 mg daily, Ranexa 1000 mg twice daily   HTN  -- Continue metoprolol succinate 50 mg daily and imdur 60 mg daily    HLD  -- Lipid panel from 05/2023 showed LDL 38, HDL 41, triglycerides 109, total cholesterol 99 -- Continue crestor 40 mg daily, zetia 10 mg daily   Patient was seen by Dr. Rennis Golden and deemed stable for discharge home.  Follow-up arranged in the office.  Medication sent to patient's pharmacy of choice. _____________  Discharge Vitals Blood pressure 113/84, pulse 90, temperature 97.7 F (36.5 C),  temperature source Oral, resp. rate 17, SpO2 99%.  There were no vitals filed for this visit.  Labs & Radiologic Studies    CBC Recent Labs    08/29/23 1040  WBC 9.5  HGB 13.3  HCT 40.0  MCV 102.6*  PLT 159   Basic Metabolic Panel Recent Labs    13/24/40 1040  NA 140  K 3.9  CL 106  CO2 27  GLUCOSE 75  BUN 19  CREATININE 1.00  CALCIUM 8.4*   Liver Function Tests No results for input(s): "AST", "ALT", "ALKPHOS", "BILITOT", "PROT", "ALBUMIN" in the last 72 hours. No results for input(s): "LIPASE", "AMYLASE" in the last 72 hours. High Sensitivity Troponin:   Recent Labs  Lab 08/29/23 1040 08/29/23 1249  TROPONINIHS 5 4    BNP Invalid input(s): "POCBNP" D-Dimer No results for input(s): "DDIMER" in the last 72 hours. Hemoglobin A1C No results for input(s): "HGBA1C" in the last 72 hours. Fasting Lipid Panel No results for input(s): "CHOL", "HDL", "LDLCALC", "TRIG", "CHOLHDL", "LDLDIRECT" in the last 72 hours. Thyroid Function Tests No results for input(s): "TSH", "T4TOTAL", "T3FREE", "THYROIDAB"  in the last 72 hours.  Invalid input(s): "FREET3" _____________  CARDIAC CATHETERIZATION  Result Date: 08/29/2023   Ost LAD to Prox LAD lesion is 80% stenosed.   Mid LAD lesion is 80% stenosed.   1st Diag lesion is 90% stenosed.   Prox RCA to Mid RCA lesion is 100% stenosed.   Prox Cx lesion is 80% stenosed.   Previously placed Origin to Prox Graft stent of unknown type is  widely patent.   Non-stenotic Mid Cx lesion was previously treated.   LIMA graft was visualized by angiography.   SVG graft was visualized by angiography.   SVG graft was visualized by angiography.   The left ventricular systolic function is normal.   LV end diastolic pressure is normal.   The left ventricular ejection fraction is 50-55% by visual estimate. Severe stenosis proximal and mid LAD. Patent LIMA graft to the mid LAD. Severe stenosis in the ostium of the small to moderate caliber Diagonal branch.  Patent vein graft to the Diagonal branch with patent stent in the ostium of the Diagonal branch. The stent appears to extend into the aorta. Severe stenosis in the mid Circumflex artery. Patent vein graft to the obtuse marginal branch. Large dominant RCA with chronic occlusion of the proximal vessel. The distal vessel and branches fill from left to right collaterals. Normal LV systolic function. LVEDP 11 mmHg. Recommendations: The RCA occlusion is new since his cardiac cath in 2021. This vessel has brisk collateral filling. His CAD is otherwise stable. No targets for PCI. Continue medical management of CAD. He will resume his Ranexa as his angina started when he stopped his Ranexa this week. Discharge home tonight after bedrest.   DG Chest 2 View  Result Date: 08/29/2023 CLINICAL DATA:  Chest pain beginning last night. Coronary artery disease. EXAM: CHEST - 2 VIEW COMPARISON:  10/26/2019 FINDINGS: The heart size and mediastinal contours are within normal limits. Prior CABG again noted. Both lungs are clear. The visualized skeletal structures are unremarkable. IMPRESSION: No active cardiopulmonary disease. Electronically Signed   By: Danae Orleans M.D.   On: 08/29/2023 13:38   MR LUMBAR SPINE WO CONTRAST  Result Date: 08/12/2023 CLINICAL DATA:  Low back pain, systems persist with greater than 6 weeks of treatment. EXAM: MRI LUMBAR SPINE WITHOUT CONTRAST TECHNIQUE: Multiplanar, multisequence MR imaging of the lumbar spine was performed. No intravenous contrast was administered. COMPARISON:  None Available. FINDINGS: Segmentation: 5 non rib-bearing lumbar type vertebral bodies are present. The lowest fully formed vertebral body is L5. Alignment: Grade 1 retrolisthesis at L2-3 measures 3 mm in the midline. Slight retrolisthesis is present at L3-4. Lumbar lordosis is preserved. Rightward curvature is centered at L3. Vertebrae: Edematous type 1 Modic changes are present on the left at L2-3. Marrow signal and  vertebral body heights are otherwise normal. Conus medullaris and cauda equina: Conus extends to the L1-2 level. Conus and cauda equina appear normal. Paraspinal and other soft tissues: Limited imaging the abdomen is unremarkable. There is no significant adenopathy. No solid organ lesions are present. Disc levels: L1-2: Insert normal disc L2-3: A leftward disc protrusion is present. Moderate facet hypertrophy is present on the left. This results in moderate left subarticular and foraminal stenosis. Mild right foraminal narrowing is present. L3-4: A mild leftward disc protrusion is present. The central canal is patent. Mild left foraminal narrowing is present. L4-5: A mild broad-based disc protrusion is present. Disc material extends into the foramina bilaterally. Central canal is patent. Mild foraminal narrowing  is worse on the left. L5-S1: A shallow right paramedian disc protrusion is present. This potentially contacts the traversing right S1 nerve roots. No other stenosis is present. The foramina are patent bilaterally. IMPRESSION: 1. Moderate left subarticular and foraminal stenosis at L2-3 secondary to a leftward disc protrusion and moderate facet hypertrophy on the left. 2. Focal rightward curvature at L2-3 with asymmetric type 1 Modic changes on the left at L2-3 suggesting more recent changes. 3. Mild right foraminal narrowing at L2-3. 4. Mild left foraminal narrowing at L3-4. 5. Mild foraminal narrowing bilaterally at L4-5 is worse on the left. 6. Shallow right paramedian disc protrusion at L5-S1 potentially contacts the traversing right S1 nerve roots. Electronically Signed   By: Marin Hikaru Delorenzo M.D.   On: 08/12/2023 12:55   CT ANGIO NECK W OR WO CONTRAST  Result Date: 08/12/2023 CLINICAL DATA:  Pseudoaneurysm of the carotid artery. EXAM: CT ANGIOGRAPHY NECK TECHNIQUE: Multidetector CT imaging of the neck was performed using the standard protocol during bolus administration of intravenous contrast.  Multiplanar CT image reconstructions and MIPs were obtained to evaluate the vascular anatomy. Carotid stenosis measurements (when applicable) are obtained utilizing NASCET criteria, using the distal internal carotid diameter as the denominator. RADIATION DOSE REDUCTION: This exam was performed according to the departmental dose-optimization program which includes automated exposure control, adjustment of the mA and/or kV according to patient size and/or use of iterative reconstruction technique. CONTRAST:  75mL OMNIPAQUE IOHEXOL 350 MG/ML SOLN COMPARISON:  CT angio neck 06/14/2023 FINDINGS: Aortic arch: 3 vessel arch configuration is present. No focal atherosclerotic changes present. No aneurysm or stenosis is present. Right carotid system: Right common carotid artery is within normal limits. Atherosclerotic calcifications are present at the right carotid bifurcation without significant stenosis. Mild irregularity is present in the mid cervical right ICA mild tortuosity is present just below the skull base. No focal stenosis is present. The cavernous internal carotid artery demonstrates calcifications without focal stenosis. The visualized MCA and ACA are normal. Left carotid system: The left common carotid artery is within normal limits. Bifurcation is unremarkable. 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 Mild tortuosity is present just below the skull base without other focal stenosis or aneurysm. The intracranial ICA demonstrates some calcification within the cavernous segment without focal stenosis. The ICA terminus is normal. The left A1 is aplastic. Left MCA is within normal limits. Vertebral arteries: The vertebral arteries are codominant. Both vertebral arteries originate from the subclavian arteries without significant stenosis. No significant stenosis is present in either vertebral artery in the neck. The intracranial vertebral arteries are  normal. The PICA origins are visualized and normal. The vertebrobasilar junction basilar artery is normal. Both posterior cerebral arteries originate from basilar tip. The PCA branch vessels are within normal limits. Skeleton: The vertebral body heights are maintained. Slight reversal of the upper cervical lordosis is present. Mild endplate degenerative changes are present. The patient is status post median sternotomy. No focal osseous lesions are present. Other neck: Soft tissues the neck are otherwise unremarkable. Salivary glands are within normal limits. Thyroid is normal. No significant adenopathy is present. No focal mucosal or submucosal lesions are present. Upper chest: The lung apices are clear. The thoracic inlet is within normal limits. IMPRESSION: 1. 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 2. 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 3. No other significant focal stenosis or aneurysm of the major arteries of the neck. Electronically Signed   By: Marin Ferdie Bakken M.D.   On: 08/12/2023 12:30   Disposition   Pt is being discharged home today in good condition.  Follow-up Plans & Appointments        Discharge Medications   Allergies as of 08/29/2023       Reactions   Trintellix [vortioxetine] Other (See Comments)   Headaches.   Penicillins Rash   Did it involve swelling of the face/tongue/throat, SOB, or low BP? Unknown Did it involve sudden or severe rash/hives, skin peeling, or any reaction on the inside of your mouth or nose? Unknown Did you need to seek medical attention at a hospital or doctor's office? Unknown When did it last happen? childhood reaction      If all above answers are "NO", may proceed with cephalosporin use.        Medication List     TAKE these medications    aspirin EC 81 MG tablet Take 1  tablet (81 mg total) by mouth daily.   clopidogrel 75 MG tablet Commonly known as: PLAVIX Take 1 tablet (75 mg total) by mouth daily.   cyclobenzaprine 10 MG tablet Commonly known as: FLEXERIL Take 1 tablet (10 mg total) by mouth 3 (three) times daily as needed for muscle spasms.   DULoxetine 60 MG capsule Commonly known as: CYMBALTA Take 1 capsule (60 mg total) by mouth 2 (two) times daily.   EPINEPHrine 0.3 mg/0.3 mL Soaj injection Commonly known as: EpiPen 2-Pak Inject 0.3 mg into the muscle as needed for anaphylaxis.   ezetimibe 10 MG tablet Commonly known as: ZETIA Take 1 tablet (10 mg total) by mouth daily. What changed: when to take this   fexofenadine 60 MG tablet Commonly known as: ALLEGRA Take 1 tablet (60 mg total) by mouth daily. What changed: when to take this   hydrochlorothiazide 12.5 MG tablet Commonly known as: HYDRODIURIL Take 1 tablet (12.5 mg total) by mouth daily.   isosorbide mononitrate 60 MG 24 hr tablet Commonly known as: IMDUR Take 1 tablet (60 mg total) by mouth daily.   lithium carbonate 300 MG ER tablet Commonly known as: LITHOBID Take 1 tablet (300 mg total) by mouth 2 (two) times daily.   LORazepam 1 MG tablet Commonly known as: ATIVAN Take 1 tablet (1 mg total) by mouth every 8 (eight) hours as needed for anxiety. What changed:  when to take this additional instructions   meclizine 25 MG tablet Commonly known as: ANTIVERT Take 1 tablet (25 mg total) by mouth 3 (three) times daily as needed for dizziness.   metoprolol succinate 50 MG 24 hr tablet Commonly known as: TOPROL-XL Take 1 tablet by mouth daily. Take with or immediately following a meal.   multivitamin with minerals Tabs tablet Take 1 tablet by mouth daily. Men's Multivitamin   nitroGLYCERIN 0.4 MG SL tablet Commonly known as: NITROSTAT Dissolve 1 tablet (0.4 mg total) under the tongue every 5 (five) minutes up to 3 doses as needed for chest pain. If no relief after 3  doses call 911.   ondansetron 4 MG tablet Commonly known as: Zofran Take 1 tablet (4 mg total) by mouth every 8 (eight) hours as needed for nausea or vomiting.   pantoprazole 40 MG tablet Commonly known as: PROTONIX Take 1 tablet (40 mg total) by mouth daily.   ranolazine  1000 MG SR tablet Commonly known as: RANEXA Take 1 tablet by mouth 2 times daily.   rosuvastatin 40 MG tablet Commonly known as: CRESTOR Take 1 tablet (40 mg total) by mouth daily. What changed: when to take this   Vraylar 3 MG capsule Generic drug: cariprazine Take 1 capsule (3 mg total) by mouth daily.   Wegovy 2.4 MG/0.75ML Soaj Generic drug: Semaglutide-Weight Management Inject 2 mg into the skin once a week. Saturday        Outstanding Labs/Studies   N/a   Duration of Discharge Encounter   Greater than 30 minutes including physician time.  Signed, Laverda Page, NP 08/29/2023, 5:02 PM

## 2023-08-29 NOTE — Discharge Instructions (Signed)

## 2023-08-29 NOTE — Progress Notes (Signed)
Cardiology Consultation:    Date:  08/29/2023   ID:  Jon Gonzales, DOB 1961/02/06, MRN [id number]  PCP:  Jon Sofia, PA-C  Cardiologist:  Jon Corporal Hayli Milligan, MD   Referring MD: Jon Sofia, PA-C   No chief complaint on file.    ASSESSMENT AND PLAN:   Mr Jon Gonzales 62 year old male patient with history of hypertension, hyperlipidemia, mitral regurgitation mild, premature coronary artery disease history with multiple PCI's in the past, s/p CABG in 2012 [LIMA-LAD, SVG-diagonal, SVG-OM], last cath January 2021 underwent PCI of diagonal branch and last stress test with nuclear imaging from June 2023 was a low risk study showing small size defect of mild reversible perfusion defect in inferior locations suggestive of ischemia.  Now with episodes of chest discomfort since last night 3 episodes thus far requiring sublingual nitroglycerin.  On stable medication regimen at home with dual antiplatelet therapy aspirin and Plavix, and antianginal regimen with ranolazine, metoprolol, Imdur, but appears to have missed last few days doses of ranolazine as he failed to fill up the prescription.    Problem List Items Addressed This Visit     Unstable angina (HCC) - Primary    With frequent chest pain symptoms at this time and given his underlying history of significant CAD history, concerning for unstable angina.  May be somewhat related to missing doses of ranolazine over the last few days but with acute symptoms, would recommend aggressive management.  Recommended further evaluation in the ER and possibly to be assessed with serial troponins and coronary angiogram.  His last meal was a drink of Ensure protein shake earlier in the morning.  He is currently hemodynamically stable and they feel comfortable going in private vehicle to the [facility], ER.  Jon Gonzales will be driving him. I called and d/w cardiology cath lab co-ordinator at [facility], to help facilitate expedited assessment in the ER and  notify inpatient cardiology team.           History of Present Illness:    Jon Gonzales is a 62 y.o. male who is being seen today for follow-up visit.  His last visit with [place] was with Dr. Bing Gonzales 11/25/2021 . At the request of Jon Sofia, PA-C.   Here for the visit accompanied by his wife Dr. Blane Ohara MD History of CAD multiple PCI's in the past, s/p CABG in 2012 [LIMA-LAD, SVG-diagonal, SVG-OM], last cath January 2021 patent grafts with significant lesion at the origin of SVG-diagonal, subsequently PCI of diagonal branch.  Last ischemic workup was with nuclear imaging stress test March 22, 2022 that showed low risk study with small size defect showing mild reduction in uptake in inferior locations with normal wall motion was suggestive of ischemia. Also has history of mild mitral regurgitation, hypertension, dyslipidemia.  In his usual state of health until yesterday.  Had an episode of chest pressure last evening lasted for few minutes until he took sublingual nitroglycerin that he stopped the pain.  This morning had another episode while at work required sublingual nitroglycerin to ease the pain. EKG in the clinic today noted sinus rhythm heart rate 89/min, PR interval 144 ms, QRS duration 92 ms, normal QRS axis with no significant ST-T wave changes. Prior EKG for comparison from 06/12/2023 noted sinus rhythm heart rate 72/min, normal PR interval, normal QRS axis with duration 119 ms, nonspecific T wave changes in precordial leads.  The called our office and came in for urgent appointment. While in the office he had  another onset of chest discomfort on the left side precordial, mild to moderate intensity similar to his last 2 episodes.  Took another of his sublingual nitroglycerin that eased up the pain.  Mentions the discomfort is similar to his episodes couple years ago when he had angina and underwent PCI.  Per his wife he failed to fill up ranolazine prescriptions few days ago.   Compliant with all other medications.  Hemodynamically remained stable.  Last echocardiogram from February 2022 noted LVEF 60 to 65%, normal RV function, normal diastolic parameters, mild mitral regurgitation     Past Medical History:  Diagnosis Date   Abnormal EKG 06/13/2023   Abnormal stress test small area of mild ischemia inferior wall 05/24/2022   Aphasia 06/13/2023   Arthropathy of lumbar facet joint 07/03/2023   Bilateral sacroiliitis (HCC) 08/29/2023   Bipolar 1 disorder 02/04/2023   Chronic bilateral low back pain with right-sided sciatica 06/24/2023   Chronic headaches 01/30/2021   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   Coronary artery disease involving coronary bypass graft of native heart with angina pectoris 05/22/2016   Cath 2013 LAD 95% stenosis, D1 90% stenosis, RCA 20%, Circ 30%. Previous stent to D1 2012. EF 60%.   Degeneration of lumbar intervertebral disc 07/03/2023   Dizziness 10/27/2020   Excessive daytime sleepiness 09/11/2016   Gastroesophageal reflux disease 02/04/2023   Generalized anxiety disorder 05/22/2016   Herpes zoster 01/20/2020   History of small bowel obstruction    Admitted 10-14-2007 for partical small bowel obstruction and N.G. tube was placed. Admitted by Dr Jon Gonzales. 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 Small internal hemorrhoids. Otherwise normal colonoscopy to terminal ileum   CORONARY ARTERY BYPASS GRAFT  02/19/2012   L - LAD, SVG - D1, SVG - OM   CORONARY STENT INTERVENTION N/A 11/05/2019   Procedure: CORONARY STENT INTERVENTION;  Surgeon: Runell Gess, MD;  Location: [facility];  Service: Cardiovascular;  Laterality: N/A;  SVG to DIAG   ESOPHAGOGASTRODUODENOSCOPY  01/08/2007   Multiple duodenal ulcers with stenosis/stricture of the distal first portion of the duodenum (measuring approximantely 9mm) Mild gastritis.   LEFT HEART CATH AND CORS/GRAFTS ANGIOGRAPHY N/A 11/05/2019   Procedure: LEFT HEART CATH AND CORS/GRAFTS ANGIOGRAPHY;  Surgeon: Runell Gess, MD;  Location: [facility];  Service: Cardiovascular;  Laterality: N/A;   RENAL ANGIOGRAPHY N/A 11/05/2019   Procedure: RENAL ANGIOGRAPHY;  Surgeon: Runell Gess, MD;  Location: [facility];  Service: Cardiovascular;  Laterality: N/A;  TONSILLECTOMY AND ADENOIDECTOMY     WRIST SURGERY      Current Medications: Current Meds  Medication Sig   aspirin EC 81 MG tablet Take 1 tablet (81 mg total) by mouth daily.   cariprazine (VRAYLAR) 3 MG capsule Take 1 capsule (3 mg total) by mouth daily.   clopidogrel (PLAVIX) 75 MG tablet Take 1 tablet (75 mg total) by mouth daily.   cyclobenzaprine (FLEXERIL) 10 MG tablet Take 1 tablet (10 mg total) by mouth 3 (three) times daily as needed for muscle spasms.   DULoxetine (CYMBALTA) 60 MG capsule Take 1 capsule (60 mg total) by mouth 2 (two) times daily.   EPINEPHrine (EPIPEN 2-PAK) 0.3 mg/0.3 mL IJ  SOAJ injection Inject 0.3 mg into the muscle as needed for anaphylaxis.   ezetimibe (ZETIA) 10 MG tablet Take 1 tablet (10 mg total) by mouth daily.   fexofenadine (ALLEGRA) 60 MG tablet Take 1 tablet (60 mg total) by mouth daily.   hydrochlorothiazide (HYDRODIURIL) 12.5 MG tablet Take 1 tablet (12.5 mg total) by mouth daily.   isosorbide mononitrate (IMDUR) 60 MG 24 hr tablet Take 1 tablet (60 mg total) by mouth daily.   lithium carbonate (LITHOBID) 300 MG ER tablet Take 1 tablet (300 mg total) by mouth 2 (two) times daily.   LORazepam (ATIVAN) 1 MG tablet Take 1 tablet (1 mg total) by mouth every 8 (eight) hours as needed for anxiety.   meclizine (ANTIVERT) 25 MG tablet Take 1 tablet (25 mg total) by mouth 3 (three) times daily as needed for dizziness.   metoprolol succinate (TOPROL-XL) 50 MG 24 hr tablet Take 1 tablet by mouth daily. Take with or immediately following a meal.   Multiple Vitamin (MULTIVITAMIN WITH MINERALS) TABS tablet Take 1 tablet by mouth daily. Men's Multivitamin   nitroGLYCERIN (NITROSTAT) 0.4 MG SL tablet Dissolve 1 tablet (0.4 mg total) under the tongue every 5 (five) minutes up to 3 doses as needed for chest pain. If no relief after 3 doses call 911.   ondansetron (ZOFRAN) 4 MG tablet Take 1 tablet (4 mg total) by mouth every 8 (eight) hours as needed for nausea or vomiting.   pantoprazole (PROTONIX) 40 MG tablet Take 1 tablet (40 mg total) by mouth daily.   ranolazine (RANEXA) 1000 MG SR tablet Take 1 tablet by mouth 2 times daily.   rosuvastatin (CRESTOR) 40 MG tablet Take 1 tablet (40 mg total) by mouth daily.   Semaglutide-Weight Management (WEGOVY) 2.4 MG/0.75ML SOAJ Inject 2 mg into the skin once a week.     Allergies:   Trintellix [vortioxetine] and Penicillins   Social History   Socioeconomic History   Marital status: Married    Spouse name: Not on file   Number of children: Not on file   Years of education: 16   Highest education level: Bachelor's degree  (e.g., BA, AB, BS)  Occupational History   Occupation: Oncologist: [employer]  Tobacco Use   Smoking status: Never   Smokeless tobacco: Never  Vaping Use   Vaping status: Never Used  Substance and Sexual Activity   Alcohol use: Yes    Alcohol/week: 14.0 standard drinks of alcohol    Types: 14 Cans of beer per week    Comment: ~ 2 beers nightly   Drug use: Never   Sexual activity: Not on file  Other Topics Concern   Not on file  Social History Narrative   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        Right Handed   Lives in two story home    Social Determinants of Health   Financial Resource Strain: Low Risk  (06/04/2022)   Overall Financial Resource Strain (CARDIA)    Difficulty of Paying Living Expenses: Not hard at all  Food Insecurity: No Food Insecurity (06/04/2022)   Hunger Vital Sign    Worried About Running Out of Food in the Last Year: Never true    Ran Out of Food in the Last Year: Never true  Transportation Needs: No Transportation Needs (06/04/2022)   PRAPARE - Administrator, Civil Service (Medical): No    Lack of Transportation (Non-Medical): No  Physical Activity: Inactive (06/04/2022)   Exercise Vital Sign    Days of Exercise per Week: 0 days    Minutes of Exercise per Session: 0 min  Stress: No Stress Concern Present (06/04/2022)   [employer] of Occupational Health - Occupational Stress Questionnaire    Feeling of Stress : Not at all  Social Connections: Moderately Integrated (06/04/2022)   Social Connection and Isolation Panel [NHANES]    Frequency of Communication with Friends and Family: More than three times a week    Frequency of Social Gatherings with Friends and Family: More than three times a week    Attends Religious Services: Never    Database administrator or Organizations: Yes    Attends Engineer, structural: More than 4 times per year     Marital Status: Married     Family History: The patient's family history includes Aneurysm (age of onset: 61) in his mother; CAD (age of onset: 24) in his father; Dementia in an other family member; Healthy in his brother; Heart disease in his sister. ROS:   Please see the history of present illness.    All 14 point review of systems negative except as described per history of present illness.  EKGs/Labs/Other Studies Reviewed:    The following studies were reviewed today: Cardiac Studies & Procedures   CARDIAC CATHETERIZATION  CARDIAC CATHETERIZATION 11/05/2019  Narrative Images from the original result were not included.   Mid RCA lesion is 50% stenosed.  Prox Cx lesion is 90% stenosed.  Previously placed Mid Cx stent (unknown type) is widely patent.  Ost LAD to Prox LAD lesion is 80% stenosed.  Mid LAD lesion is 80% stenosed.  1st Diag lesion is 90% stenosed.  A drug-eluting stent was successfully placed using a SYNERGY XD 3.0X16.  Origin to Prox Graft lesion is 60% stenosed.  Post intervention, there is a 0% residual stenosis.  The left ventricular systolic function is normal.  LV end diastolic pressure is normal.  The left ventricular ejection fraction is 55-65% by visual estimate.  THADEN ABAJIAN is a 62 y.o. male   [id number] LOCATION:  FACILITY: [facility] PHYSICIAN: Nanetta Batty, M.D. May 30, 1961   DATE OF PROCEDURE:  11/05/2019  DATE OF DISCHARGE:     CARDIAC CATHETERIZATION / PCI DES SVG Diag    History obtained from chart review.  Mr. Reppucci is a 62 year old married Caucasian male patient of Dr. Kem Parkinson has had multiple stents remotely ultimately underwent CABG x3 at [facility] in 2013 with a LIMA to his LAD, vein to a diagonal branch and obtuse marginal branch.  Other problems include hypertension and hyperlipidemia.  He said several weeks of exertional chest pain and dyspnea.  He  was referred by Dr. Tomie China for diagnostic  coronary angiography.  In addition, because of his hypertension it was requested that he had renal angiography as well to rule out renal vascular hypertension.   PROCEDURE DESCRIPTION:  The patient was brought to the second floor [facility] cath lab in the postabsorptive state. He was premedicated with IV Versed and fentanyl. His right groinwas prepped and shaved in usual sterile fashion. Xylocaine 1% was used for local anesthesia. A 6 French sheath was inserted into the right common femoral artery using standard Seldinger technique.  5 French right and left Judkins diagnostic catheters on with a 5 French pigtail catheter used for selective coronary angiography, selective vein graft and IMA graft angiography as well as left ventriculography.  Distal abdominal aortography was also performed.  Isovue dye was used for the entirety of the case.  Retrograde aorta, ventricular and pullback pressures were recorded.  The patient received 60 mg of p.o. Plavix, 20 mg of IV Pepcid and 13,500 units of heparin with an ACT of 274.  Using a 6 French left bypass graft catheter, 0.14 Prowater guidewire and a 3 mm x 16 mm long Synergy drug-eluting stent the origin of the vein graft was engaged.  There was significant damping and slow flow which resolved with disengagement suggesting this was indeed a hemodynamically significant lesion.  I carefully positioned the stent across the origin of the graft and deployed it at 14 to 16 atm.  I postdilated with a 3.25 mm x 12 mm long noncompliant balloon up to 14 atm (3.3 mm) resulting in reduction of a 60 to 70% ostial first diagonal branch SVG to 0% residual.  Patient tolerated procedure well.  Guidewire and catheter were removed.  The sheath was secured in place.  The patient left lab in stable condition.  Impression Mr. Dimuzio had patent grafts with a significant lesion at the origin of the of the SVG to the first diagonal branch.  He also had a 40 to 50% segmental mid to  distal RCA stenosis at the genu that did not appear to be physiologically significant.  The LIMA to the LAD was patent and the vein to the distal marginal branch was as well.  I reviewed his angiograms with Dr. Katrinka Blazing and we both agreed to proceed with diagonal branch intervention which was successful using a synergy drug-eluting stent.  Given the low risk nature of the procedure, the ease of access I feel comfortable send the patient home today as an outpatient.  He will see Dr. Tomie China back in the office in 2 to 3 weeks.  He will need dual antiplatelet therapy with aspirin Plavix uninterrupted for at least 12 months.  Nanetta Batty. MD, [facility] 11/05/2019 11:23 AM  Findings Coronary Findings Diagnostic  Dominance: Right  Left Anterior Descending Ost LAD to Prox LAD lesion is 80% stenosed. Mid LAD lesion is 80% stenosed. The lesion was previously treated.  First Diagonal Branch 1st Diag lesion is 90% stenosed.  Left Circumflex Prox Cx lesion is 90% stenosed. Previously placed Mid Cx stent (unknown type) is widely patent.  Right Coronary Artery Mid RCA lesion is 50% stenosed.  Graft To 3rd Mrg  LIMA Graft To Dist LAD  Graft To 1st Diag Origin to Prox Graft lesion is 60% stenosed.  Intervention  Origin to Prox Graft lesion (Graft To 1st Diag) Stent Lesion crossed with guidewire. A drug-eluting stent was successfully placed using a SYNERGY XD 3.0X16. Stent strut is well apposed. Stent does  not overlap previously placed stentPost-stent angioplasty was performed. Post-Intervention Lesion Assessment The intervention was successful. Pre-interventional TIMI flow is 3. Post-intervention TIMI flow is 3. No complications occurred at this lesion. There is a 0% residual stenosis post intervention.   STRESS TESTS  MYOCARDIAL PERFUSION IMAGING 03/22/2022  Narrative   Findings are consistent with ischemia. The study is low risk.   LV perfusion is abnormal. Defect 1: There is a small defect  with mild reduction in uptake present in the inferior location(s) that is reversible. There is normal wall motion in the defect area. Consistent with ischemia.   Left ventricular function is normal. Nuclear stress EF: 72 %. The left ventricular ejection fraction is hyperdynamic (>65%). End diastolic cavity size is normal.   Prior study not available for comparison.   ECHOCARDIOGRAM  ECHOCARDIOGRAM COMPLETE 12/06/2020  Narrative ECHOCARDIOGRAM REPORT    Patient Name:   Jon Gonzales Date of Exam: 12/06/2020 Medical Rec #:  [id number]     Height:       70.0 in Accession #:    [id number]    Weight:       215.0 lb Date of Birth:  [birth year]     BSA:          2.152 m Patient Age:    59 years      BP:           128/84 mmHg Patient Gender: M             HR:           73 bpm. Exam Location:  [facility]  Procedure: 2D Echo  Indications:    Syncope 780.2 / R55  History:        Patient has prior history of Echocardiogram examinations, most recent 02/27/2017. TIA, Signs/Symptoms:Chest Pain; Risk Factors:Hypertension and Dyslipidemia.  Sonographer:    Louie Boston Referring Phys: [id number] KARDIE TOBB  IMPRESSIONS   1. GLS: -16.5. Left ventricular ejection fraction, by estimation, is 60 to 65%. The left ventricle has normal function. The left ventricle has no regional wall motion abnormalities. Left ventricular diastolic parameters were normal. 2. Right ventricular systolic function is normal. The right ventricular size is normal. There is normal pulmonary artery systolic pressure. 3. The mitral valve is normal in structure. Mild mitral valve regurgitation. No evidence of mitral stenosis. 4. The aortic valve is normal in structure. Aortic valve regurgitation is not visualized. No aortic stenosis is present. 5. The inferior vena cava is normal in size with greater than 50% respiratory variability, suggesting right atrial pressure of 3 mmHg.  FINDINGS Left Ventricle: GLS: -16.5. Left ventricular  ejection fraction, by estimation, is 60 to 65%. The left ventricle has normal function. The left ventricle has no regional wall motion abnormalities. The left ventricular internal cavity size was normal in size. There is borderline left ventricular hypertrophy. Left ventricular diastolic parameters were normal.  Right Ventricle: The right ventricular size is normal. No increase in right ventricular wall thickness. Right ventricular systolic function is normal. There is normal pulmonary artery systolic pressure. The tricuspid regurgitant velocity is 2.40 m/s, and with an assumed right atrial pressure of 3 mmHg, the estimated right ventricular systolic pressure is 26.0 mmHg.  Left Atrium: Left atrial size was normal in size.  Right Atrium: Right atrial size was normal in size.  Pericardium: There is no evidence of pericardial effusion.  Mitral Valve: The mitral valve is normal in structure. Mild mitral valve regurgitation. No evidence of mitral valve stenosis.  Tricuspid Valve: The tricuspid valve is normal in structure. Tricuspid valve regurgitation is mild . No evidence of tricuspid stenosis.  Aortic Valve: The aortic valve is normal in structure. Aortic valve regurgitation is not visualized. No aortic stenosis is present.  Pulmonic Valve: The pulmonic valve was normal in structure. Pulmonic valve regurgitation is not visualized. No evidence of pulmonic stenosis.  Aorta: The aortic root is normal in size and structure.  Venous: The inferior vena cava is normal in size with greater than 50% respiratory variability, suggesting right atrial pressure of 3 mmHg.  IAS/Shunts: No atrial level shunt detected by color flow Doppler.   LEFT VENTRICLE PLAX 2D LVIDd:         5.80 cm  Diastology LVIDs:         3.30 cm  LV e' medial:    6.96 cm/s LV PW:         1.10 cm  LV E/e' medial:  14.4 LV IVS:        1.10 cm  LV e' lateral:   9.68 cm/s LVOT diam:     2.10 cm  LV E/e' lateral: 10.3 LV SV:          98 LV SV Index:   46 LVOT Area:     3.46 cm   RIGHT VENTRICLE         IVC TAPSE (M-mode): 1.9 cm  IVC diam: 1.80 cm  LEFT ATRIUM           Index       RIGHT ATRIUM           Index LA diam:      4.70 cm 2.18 cm/m  RA Area:     15.20 cm LA Vol (A2C): 37.6 ml 17.47 ml/m RA Volume:   34.00 ml  15.80 ml/m AORTIC VALVE LVOT Vmax:   159.00 cm/s LVOT Vmean:  96.100 cm/s LVOT VTI:    0.283 m  AORTA Ao Root diam: 3.20 cm Ao Asc diam:  3.50 cm Ao Desc diam: 2.40 cm  MITRAL VALVE               TRICUSPID VALVE MV Area (PHT): 3.03 cm    TR Peak grad:   23.0 mmHg MV Decel Time: 250 msec    TR Vmax:        240.00 cm/s MV E velocity: 99.90 cm/s MV A velocity: 82.00 cm/s  SHUNTS MV E/A ratio:  1.22        Systemic VTI:  0.28 m Systemic Diam: 2.10 cm  [name] MD Electronically signed by [name] MD Signature Date/Time: 12/06/2020/3:49:04 PM    Final    MONITORS  LONG TERM MONITOR (3-14 DAYS) 12/01/2020  Narrative The patient wore the monitor for 12 days 3 hours starting November 09, 2020. Indication: Dizziness  The minimum heart rate was 48 bpm, maximum heart rate was 108 bpm, and average heart rate was 68 bpm. Predominant underlying rhythm was Sinus Rhythm.  Premature atrial complexes were rare less than 1%. Premature Ventricular complexes were rare less than 1%.  No no ventricular tachycardia, no pauses, No AV block, no supraventricular tachycardia and no atrial fibrillation present. No patient triggered events or diary events reported.  Conclusion: Normal/unremarkable study           EKG:       Recent Labs: 06/12/2023: TSH 0.650 06/13/2023: ALT 26; BUN 12; Creatinine, Ser 1.24; Hemoglobin 13.8; Platelets 139; Potassium 3.8; Sodium 139  Recent Lipid Panel  Component Value Date/Time   CHOL 99 (L) 06/12/2023 0742   TRIG 109 06/12/2023 0742   HDL 41 06/12/2023 0742   CHOLHDL 2.4 06/12/2023 0742   LDLCALC 38 06/12/2023 0742    Physical Exam:     VS:  BP 104/74   Pulse 88   Ht 5\' 10"  (1.778 m)   Wt 186 lb 12.8 oz (84.7 kg)   SpO2 97%   BMI 26.80 kg/m     Wt Readings from Last 3 Encounters:  08/29/23 186 lb 12.8 oz (84.7 kg)  08/29/23 184 lb (83.5 kg)  08/02/23 179 lb (81.2 kg)     GENERAL:  Well nourished, well developed in no acute distress NECK: No JVD; No carotid bruits CARDIAC: RRR, S1 and S2 present, no murmurs, no rubs, no gallops CHEST:  Clear to auscultation without rales, wheezing or rhonchi  Extremities: No pitting pedal edema. Pulses bilaterally symmetric with radial 2+ and dorsalis pedis 2+ NEUROLOGIC:  Alert and oriented x 3  Medication Adjustments/Labs and Tests Ordered: Current medicines are reviewed at length with the patient today.  Concerns regarding medicines are outlined above.  No orders of the defined types were placed in this encounter.  No orders of the defined types were placed in this encounter.   Signed, Cecille Amsterdam, MD, MPH, [facility]. 08/29/2023 9:37 AM    [facility]

## 2023-08-29 NOTE — Progress Notes (Signed)
Acute Office Visit  Subjective:    Patient ID: Jon Gonzales, male    DOB: 09-20-1961, 62 y.o.   MRN: [id number]  Chief Complaint  Patient presents with   Chest Pain    HPI: Patient is in today for chest pain 8/10 since yesterday (2 days). He took nitroglycerin today and the pain is better. He verbalized no pain now. He had chest pain yesterday about 8/10 and took a nitroglycerin that relieved the pain. States that it started on the left side and was relieved then this morning it was on the right side. Denies any SOB, excessive dizziness, syncope, or weakness. Denies any recent heart imaging.   Past Medical History:  Diagnosis Date   Abnormal EKG 06/13/2023   Abnormal stress test small area of mild ischemia inferior wall 05/24/2022   Aphasia 06/13/2023   Arthropathy of lumbar facet joint 07/03/2023   Bilateral sacroiliitis (HCC) 08/29/2023   Bipolar 1 disorder 02/04/2023   Chronic bilateral low back pain with right-sided sciatica 06/24/2023   Chronic headaches 01/30/2021   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   Coronary artery disease involving coronary bypass graft of native heart with angina pectoris 05/22/2016   Cath 2013 LAD 95% stenosis, D1 90% stenosis, RCA 20%, Circ 30%. Previous stent to D1 2012. EF 60%.   Degeneration of lumbar intervertebral disc 07/03/2023   Dizziness 10/27/2020   Excessive daytime sleepiness 09/11/2016   Gastroesophageal reflux disease 02/04/2023   Generalized anxiety disorder 05/22/2016   Herpes zoster 01/20/2020   History of small bowel obstruction    Admitted 10-14-2007 for partical small bowel obstruction and N.G. tube was placed. Admitted by Dr Dimas Chyle. 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 Small internal hemorrhoids. Otherwise normal colonoscopy to terminal ileum   CORONARY ARTERY BYPASS GRAFT  02/19/2012   L - LAD, SVG - D1, SVG - OM   CORONARY STENT INTERVENTION N/A 11/05/2019   Procedure: CORONARY STENT INTERVENTION;  Surgeon: Runell Gess, MD;  Location: [facility];  Service: Cardiovascular;  Laterality: N/A;  SVG to DIAG   ESOPHAGOGASTRODUODENOSCOPY  01/08/2007   Multiple duodenal ulcers with stenosis/stricture of the distal first portion of the duodenum (measuring approximantely 9mm) Mild gastritis.   LEFT HEART CATH AND CORS/GRAFTS ANGIOGRAPHY N/A 11/05/2019   Procedure: LEFT HEART CATH AND CORS/GRAFTS ANGIOGRAPHY;  Surgeon: Runell Gess, MD;  Location: [facility];  Service: Cardiovascular;  Laterality: N/A;   RENAL ANGIOGRAPHY N/A 11/05/2019   Procedure: RENAL ANGIOGRAPHY;  Surgeon:  Runell Gess, MD;  Location: [facility];  Service: Cardiovascular;  Laterality: N/A;   TONSILLECTOMY AND ADENOIDECTOMY     WRIST SURGERY      Family History  Problem Relation Age of Onset   Aneurysm Mother 68       cerebral   CAD Father 31   Heart disease Sister        Unclear   Healthy Brother     Dementia Other        several individuals on extended paternal side of family    Social History   Socioeconomic History   Marital status: Married    Spouse name: Not on file   Number of children: Not on file   Years of education: 16   Highest education level: Bachelor's degree (e.g., BA, AB, BS)  Occupational History   Occupation: Oncologist: [employer]  Tobacco Use   Smoking status: Never   Smokeless tobacco: Never  Vaping Use   Vaping status: Never Used  Substance and Sexual Activity   Alcohol use: Yes    Alcohol/week: 14.0 standard drinks of alcohol    Types: 14 Cans of beer per week    Comment: ~ 2 beers nightly   Drug use: Never   Sexual activity: Not on file  Other Topics Concern   Not on file  Social History Narrative   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        Right Handed   Lives in two story home    Social Determinants of Health   Financial Resource Strain: Low Risk  (06/04/2022)   Overall Financial Resource Strain (CARDIA)    Difficulty of Paying Living Expenses: Not hard at all  Food Insecurity: No Food Insecurity (06/04/2022)   Hunger Vital Sign    Worried About Running Out of Food in the Last Year: Never true    Ran Out of Food in the Last Year: Never true  Transportation Needs: No Transportation Needs (06/04/2022)   PRAPARE - Administrator, Civil Service (Medical): No    Lack of Transportation (Non-Medical): No  Physical Activity: Inactive (06/04/2022)   Exercise Vital Sign    Days of Exercise per Week: 0 days    Minutes of Exercise per Session: 0 min  Stress: No Stress Concern Present (06/04/2022)   [employer] of Occupational Health - Occupational Stress Questionnaire    Feeling of Stress : Not at all  Social Connections: Moderately Integrated (06/04/2022)   Social Connection and Isolation Panel [NHANES]    Frequency of Communication with Friends and  Family: More than three times a week    Frequency of Social Gatherings with Friends and Family: More than three times a week    Attends Religious Services: Never    Database administrator or Organizations: Yes    Attends Engineer, structural: More than 4 times per year    Marital Status: Married  Catering manager Violence: Not At Risk (06/04/2022)   Humiliation, Afraid, Rape, and Kick questionnaire    Fear of Current or Ex-Partner: No    Emotionally Abused: No    Physically Abused: No    Sexually Abused: No    Outpatient Medications Prior to Visit  Medication Sig Dispense Refill   aspirin EC 81 MG tablet Take 1 tablet (81 mg total) by mouth daily. 30 tablet 0   cariprazine (  VRAYLAR) 3 MG capsule Take 1 capsule (3 mg total) by mouth daily. 90 capsule 0   clopidogrel (PLAVIX) 75 MG tablet Take 1 tablet (75 mg total) by mouth daily. 90 tablet 2   cyclobenzaprine (FLEXERIL) 10 MG tablet Take 1 tablet (10 mg total) by mouth 3 (three) times daily as needed for muscle spasms. 90 tablet 2   DULoxetine (CYMBALTA) 60 MG capsule Take 1 capsule (60 mg total) by mouth 2 (two) times daily. 180 capsule 2   EPINEPHrine (EPIPEN 2-PAK) 0.3 mg/0.3 mL IJ SOAJ injection Inject 0.3 mg into the muscle as needed for anaphylaxis. 1 each 3   ezetimibe (ZETIA) 10 MG tablet Take 1 tablet (10 mg total) by mouth daily. 90 tablet 2   fexofenadine (ALLEGRA) 60 MG tablet Take 1 tablet (60 mg total) by mouth daily. 90 tablet 2   hydrochlorothiazide (HYDRODIURIL) 12.5 MG tablet Take 1 tablet (12.5 mg total) by mouth daily. 90 tablet 2   isosorbide mononitrate (IMDUR) 60 MG 24 hr tablet Take 1 tablet (60 mg total) by mouth daily. 90 tablet 2   lithium carbonate (LITHOBID) 300 MG ER tablet Take 1 tablet (300 mg total) by mouth 2 (two) times daily. 180 tablet 0   LORazepam (ATIVAN) 1 MG tablet Take 1 tablet (1 mg total) by mouth every 8 (eight) hours as needed for anxiety. 90 tablet 1   meclizine (ANTIVERT) 25 MG  tablet Take 1 tablet (25 mg total) by mouth 3 (three) times daily as needed for dizziness. 90 tablet 2   metoprolol succinate (TOPROL-XL) 50 MG 24 hr tablet Take 1 tablet by mouth daily. Take with or immediately following a meal. 90 tablet 2   Multiple Vitamin (MULTIVITAMIN WITH MINERALS) TABS tablet Take 1 tablet by mouth daily. Men's Multivitamin     nitroGLYCERIN (NITROSTAT) 0.4 MG SL tablet Dissolve 1 tablet (0.4 mg total) under the tongue every 5 (five) minutes up to 3 doses as needed for chest pain. If no relief after 3 doses call 911. 25 tablet 1   ondansetron (ZOFRAN) 4 MG tablet Take 1 tablet (4 mg total) by mouth every 8 (eight) hours as needed for nausea or vomiting. 20 tablet 0   pantoprazole (PROTONIX) 40 MG tablet Take 1 tablet (40 mg total) by mouth daily. 90 tablet 2   ranolazine (RANEXA) 1000 MG SR tablet Take 1 tablet by mouth 2 times daily. 180 tablet 2   rosuvastatin (CRESTOR) 40 MG tablet Take 1 tablet (40 mg total) by mouth daily. 90 tablet 2   Semaglutide-Weight Management (WEGOVY) 2.4 MG/0.75ML SOAJ Inject 2 mg into the skin once a week.     lidocaine (XYLOCAINE) 2 % solution Use as directed 15 mLs in the mouth or throat every 6 (six) hours as needed for mouth pain. (Patient not taking: Reported on 08/29/2023) 100 mL 0   meloxicam (MOBIC) 15 MG tablet Take 15 mg by mouth daily. (Patient not taking: Reported on 08/29/2023)     methocarbamol (ROBAXIN) 750 MG tablet Take 750 mg by mouth 2 (two) times daily. (Patient not taking: Reported on 08/29/2023)     metoprolol tartrate (LOPRESSOR) 50 MG tablet  (Patient not taking: Reported on 08/29/2023)     predniSONE (DELTASONE) 20 MG tablet 1 po tid for 3 days then 1 po bid for 3 days then 1 po qd for 3 days (Patient not taking: Reported on 08/29/2023) 18 tablet 0   primidone (MYSOLINE) 50 MG tablet Take 1 tablet (50 mg total)  by mouth in the morning and at bedtime. (Patient not taking: Reported on 08/29/2023) 180 tablet 0   valACYclovir  (VALTREX) 1000 MG tablet Take 1 tablet (1,000 mg total) by mouth 2 (two) times daily. (Patient not taking: Reported on 08/29/2023) 20 tablet 0   No facility-administered medications prior to visit.    Allergies  Allergen Reactions   Trintellix [Vortioxetine] Other (See Comments)    Headaches.   Penicillins Rash    Did it involve swelling of the face/tongue/throat, SOB, or low BP? Unknown Did it involve sudden or severe rash/hives, skin peeling, or any reaction on the inside of your mouth or nose? Unknown Did you need to seek medical attention at a hospital or doctor's office? Unknown When did it last happen? childhood reaction      If all above answers are "NO", may proceed with cephalosporin use.     Review of Systems  Constitutional:  Negative for chills, fatigue, fever and unexpected weight change.  HENT:  Negative for congestion, ear pain, sinus pain and sore throat.   Respiratory:  Negative for cough and shortness of breath.   Cardiovascular:  Positive for chest pain. Negative for palpitations.  Gastrointestinal:  Negative for abdominal pain, blood in stool, constipation, diarrhea, nausea and vomiting.  Endocrine: Negative for polydipsia.  Genitourinary:  Negative for dysuria.  Musculoskeletal:  Negative for back pain.  Skin:  Negative for rash.  Neurological:  Negative for headaches.       Objective:        08/29/2023    9:05 AM 08/29/2023    8:05 AM 08/02/2023    8:39 AM  Vitals with BMI  Height 5\' 10"  5\' 10"  5\' 10"   Weight 186 lbs 13 oz 184 lbs 179 lbs  BMI 26.8 26.4 25.68  Systolic 104 100 244  Diastolic 74 70 70  Pulse 88 92 78    No data found.   Physical Exam Vitals reviewed.  Constitutional:      Appearance: Normal appearance.  Neck:     Vascular: No carotid bruit.  Cardiovascular:     Rate and Rhythm: Normal rate and regular rhythm.     Heart sounds: Normal heart sounds.  Pulmonary:     Effort: Pulmonary effort is normal.     Breath sounds:  Normal breath sounds.  Chest:     Chest wall: Tenderness present. No mass or edema.  Abdominal:     General: Bowel sounds are normal.     Palpations: Abdomen is soft.     Tenderness: There is no abdominal tenderness.  Neurological:     Mental Status: He is alert and oriented to person, place, and time.  Psychiatric:        Mood and Affect: Mood normal.        Behavior: Behavior normal.     Health Maintenance Due  Topic Date Due   Colonoscopy  09/10/2022    There are no preventive care reminders to display for this patient.   Lab Results  Component Value Date   TSH 0.650 06/12/2023   Lab Results  Component Value Date   WBC 4.6 06/13/2023   HGB 13.8 06/13/2023   HCT 40.8 06/13/2023   MCV 101 (H) 06/13/2023   PLT 139 (L) 06/13/2023   Lab Results  Component Value Date   NA 139 06/13/2023   K 3.8 06/13/2023   CO2 24 06/13/2023   GLUCOSE 102 (H) 06/13/2023   BUN 12 06/13/2023   CREATININE 1.24 06/13/2023  BILITOT 0.4 06/13/2023   ALKPHOS 54 06/13/2023   AST 25 06/13/2023   ALT 26 06/13/2023   PROT 6.5 06/13/2023   ALBUMIN 4.2 06/13/2023   CALCIUM 8.8 06/13/2023   ANIONGAP 9 06/12/2023   EGFR 66 06/13/2023   Lab Results  Component Value Date   CHOL 99 (L) 06/12/2023   Lab Results  Component Value Date   HDL 41 06/12/2023   Lab Results  Component Value Date   LDLCALC 38 06/12/2023   Lab Results  Component Value Date   TRIG 109 06/12/2023   Lab Results  Component Value Date   CHOLHDL 2.4 06/12/2023   No results found for: "HGBA1C"  Total time spent on today's visit was greater than 20 minutes, including both face-to-face time and nonface-to-face time personally spent on review of chart (labs and imaging), discussing labs and goals, discussing further work-up, treatment options, referrals to specialist if needed, reviewing outside records of pertinent, answering patient's questions, and coordinating care.     Assessment & Plan:  Chest pain with  high risk of acute coronary syndrome Assessment & Plan: Will send for Troponin  No major abnormalities in ECG Will send to ER if Troponin is elevated  Orders: -     EKG 12-Lead  Chronic bilateral low back pain with right-sided sciatica Assessment & Plan: Continues to have back pain Is going to get injection in December with orthopedics Continue to monitor symptoms      No orders of the defined types were placed in this encounter.   Orders Placed This Encounter  Procedures   EKG 12-Lead     Follow-up: No follow-ups on file.  An After Visit Summary was printed and given to the patient.  Langley Gauss, [facility] [phone]

## 2023-08-29 NOTE — H&P (Signed)
Cardiology Admission History and Physical   Patient ID: KYZAR GOLSON MRN: [id number]; DOB: [birth year]   Admission date: 08/29/2023  PCP:  [name], PA-C   [facility] Providers Cardiologist:  Thomasene Ripple, DO    Chief Complaint:  Chest Pain  Patient Profile:   Jon Gonzales is a 62 y.o. male with a past medical history of hypertension, hyperlipidemia, mild mitral regurgitation, CAD with multiple PCI's in the past, s/p CABG in 2012 who is being seen 08/29/2023 for the evaluation of chest pain.  History of Present Illness:   Mr. Cipolla is a 62 year old male with above medical history.  Patient is followed by Dr. Vincent Gros.   Per chart review, patient previously underwent CABG in 2012 (LIMA-LAD, SVG-diagonal, SVG-OM). Later underwent cardiac catheterization in 10/2019 for unstable angina. Catheterization showed patent grafts with a significant lesion at the origin of the SVG-diagonal. Treated with DES to the SVG-diagonal. Later, patient wore a cardiac monitor in 11/2020 that showed predominantly normal sinus rhythm with rare PVCs, PACs. No arrhythmias noted. Echocardiogram in 11/2020 showed EF 60-65%, no regional wall motion abnormalities, normal RV function, mild MR.   Patient recently underwent nuclear stress test on 03/22/22 that showed findings consistent with ischemia, but was overall low risk. There was a small defect with mild reduction in uptake present in the inferior location with normal wall motion. Patient was started on imdur. He was last seen by Dr. Bing Matter on 03/28/23. At that time, patient was doing well.   Patient was seen by Dr. Vincent Gros in clinic this morning. At his appointment, patient reported having 3 episodes of chest discomfort since the night prior. Chest pain improved with sl nitroglycerin. Reported that chest pain was similar to the episodes he had before PCI in 2021.   On interview, patient reports that he has been having intermittent episodes of chest  pain for the past 3 weeks or so. Initially, episodes were infrequent and occurred a few days apart. However, episodes began to occur more frequently, and he has had 3 episodes of pain the past 2 days. Pain occurs both at rest and on exertion. Feels very similar to when he has needed PCI in the past. He also has felt much more fatigued than usual, and gets tired easier on exertion than he normally does. Denies shortness of breath, cough, nasal congestion, fever, chills, body aches, nausea. Denies palpitations, ankle edema. In the ED, he is feeling well. No current chest pain    Past Medical History:  Diagnosis Date   Abnormal EKG 06/13/2023   Abnormal stress test small area of mild ischemia inferior wall 05/24/2022   Aphasia 06/13/2023   Arthropathy of lumbar facet joint 07/03/2023   Bilateral sacroiliitis (HCC) 08/29/2023   Bipolar 1 disorder 02/04/2023   Chronic bilateral low back pain with right-sided sciatica 06/24/2023   Chronic headaches 01/30/2021   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   Coronary artery disease involving coronary bypass graft of native heart with angina pectoris 05/22/2016   Cath 2013 LAD 95% stenosis, D1 90% stenosis, RCA 20%, Circ 30%. Previous stent to D1 2012. EF 60%.   Degeneration of lumbar intervertebral disc 07/03/2023   Dizziness 10/27/2020   Excessive daytime sleepiness 09/11/2016   Gastroesophageal reflux disease 02/04/2023   Generalized anxiety disorder 05/22/2016   Herpes zoster 01/20/2020   History of small bowel obstruction    Admitted 10-14-2007 for partical small bowel  obstruction and N.G. tube was placed. Admitted by Dr Dimas Chyle. 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 Small internal hemorrhoids. Otherwise normal colonoscopy to terminal ileum   CORONARY ARTERY BYPASS GRAFT  02/19/2012   L - LAD, SVG - D1, SVG - OM   CORONARY STENT INTERVENTION N/A 11/05/2019   Procedure: CORONARY STENT INTERVENTION;  Surgeon: Runell Gess, MD;  Location: [facility];  Service: Cardiovascular;  Laterality: N/A;  SVG to DIAG   ESOPHAGOGASTRODUODENOSCOPY  01/08/2007   Multiple duodenal ulcers with stenosis/stricture of the distal first portion of the duodenum (measuring approximantely 9mm) Mild gastritis.   LEFT HEART CATH AND CORS/GRAFTS ANGIOGRAPHY N/A 11/05/2019   Procedure: LEFT HEART CATH AND CORS/GRAFTS ANGIOGRAPHY;  Surgeon: Runell Gess, MD;  Location: [facility];  Service: Cardiovascular;  Laterality: N/A;   RENAL ANGIOGRAPHY N/A 11/05/2019   Procedure: RENAL ANGIOGRAPHY;  Surgeon: Runell Gess, MD;  Location: [facility];  Service: Cardiovascular;  Laterality: N/A;   TONSILLECTOMY AND ADENOIDECTOMY     WRIST SURGERY       Medications Prior to Admission: Prior to Admission medications   Medication Sig Start Date End Date Taking? Authorizing Provider  aspirin EC 81 MG tablet Take 1 tablet (81 mg total) by mouth  daily. 11/05/19   Runell Gess, MD  cariprazine (VRAYLAR) 3 MG capsule Take 1 capsule (3 mg total) by mouth daily. 07/29/23   Cottle, Steva Ready., MD  clopidogrel (PLAVIX) 75 MG tablet Take 1 tablet (75 mg total) by mouth daily. 10/03/22   [name], MD  cyclobenzaprine (FLEXERIL) 10 MG tablet Take 1 tablet (10 mg total) by mouth 3 (three) times daily as needed for muscle spasms. 10/03/22   [name], MD  DULoxetine (CYMBALTA) 60 MG capsule Take 1 capsule (60 mg total) by mouth 2 (two) times daily. 07/29/23   Cottle, Steva Ready., MD  EPINEPHrine (EPIPEN 2-PAK) 0.3 mg/0.3 mL IJ SOAJ injection Inject 0.3 mg into the muscle as needed for anaphylaxis. 09/22/22   [name], MD  ezetimibe (ZETIA) 10 MG tablet Take 1 tablet (10 mg total) by mouth daily. 10/03/22   [name], MD  fexofenadine (ALLEGRA) 60 MG tablet Take 1 tablet (60 mg total) by mouth daily. 10/03/22   [name], MD  hydrochlorothiazide (HYDRODIURIL) 12.5 MG tablet Take 1 tablet (12.5 mg total) by mouth daily. 10/03/22   [name], MD  isosorbide mononitrate (IMDUR) 60 MG 24 hr tablet Take 1 tablet (60 mg total) by mouth daily. 10/03/22  [name], MD  lithium carbonate (LITHOBID) 300 MG ER tablet Take 1 tablet (300 mg total) by mouth 2 (two) times daily. 07/29/23   Cottle, Steva Ready., MD  LORazepam (ATIVAN) 1 MG tablet Take 1 tablet (1 mg total) by mouth every 8 (eight) hours as needed for anxiety. 07/29/23   Cottle, Steva Ready., MD  meclizine (ANTIVERT) 25 MG tablet Take 1 tablet (25 mg total) by mouth 3 (three) times daily as needed for dizziness. 10/03/22   [name], MD  metoprolol succinate (TOPROL-XL) 50 MG 24 hr tablet Take 1 tablet by mouth daily. Take with or immediately following a meal. 10/03/22   [name], MD  Multiple Vitamin (MULTIVITAMIN WITH MINERALS) TABS tablet Take 1 tablet by mouth daily. Men's  Multivitamin    [provider]  nitroGLYCERIN (NITROSTAT) 0.4 MG SL tablet Dissolve 1 tablet (0.4 mg total) under the tongue every 5 (five) minutes up to 3 doses as needed for chest pain. If no relief after 3 doses call 911. 09/22/22   [name], MD  ondansetron (ZOFRAN) 4 MG tablet Take 1 tablet (4 mg total) by mouth every 8 (eight) hours as needed for nausea or vomiting. 10/13/22   Janie Morning, NP  pantoprazole (PROTONIX) 40 MG tablet Take 1 tablet (40 mg total) by mouth daily. 10/03/22   [name], MD  ranolazine (RANEXA) 1000 MG SR tablet Take 1 tablet by mouth 2 times daily. 10/03/22   [name], MD  rosuvastatin (CRESTOR) 40 MG tablet Take 1 tablet (40 mg total) by mouth daily. 10/03/22   [name], MD  Semaglutide-Weight Management (WEGOVY) 2.4 MG/0.75ML SOAJ Inject 2 mg into the skin once a week.    [provider]  diltiazem (CARDIZEM SR) 60 MG 12 hr capsule Take 1 capsule (60 mg total) by mouth 2 (two) times daily. 05/02/20 11/07/20  [name], MD  pantoprazole (PROTONIX) 40 MG tablet Take 1 tablet (40 mg total) by mouth daily. 01/17/22   Janie Morning, NP  triamterene-hydrochlorothiazide (DYAZIDE) 37.5-25 MG capsule Take 1 each (1 capsule total) by mouth daily. 08/08/20 10/25/20  [name], MD     Allergies:    Allergies  Allergen Reactions   Trintellix [Vortioxetine] Other (See Comments)    Headaches.   Penicillins Rash    Did it involve swelling of the face/tongue/throat, SOB, or low BP? Unknown Did it involve sudden or severe rash/hives, skin peeling, or any reaction on the inside of your mouth or nose? Unknown Did you need to seek medical attention at a hospital or doctor's office? Unknown When did it last happen? childhood reaction      If all above answers are "NO", may proceed with cephalosporin use.     Social History:   Social History   Socioeconomic History   Marital  status: Married    Spouse name: Not on file   Number of children: Not on file   Years of education: 16   Highest education level: Bachelor's degree (e.g., BA, AB, BS)  Occupational History   Occupation: Oncologist: [employer]  Tobacco Use   Smoking status: Never   Smokeless tobacco: Never  Vaping Use   Vaping status: Never Used  Substance and Sexual Activity   Alcohol use: Yes    Alcohol/week: 14.0 standard drinks of alcohol    Types: 14 Cans of beer per week  Comment: ~ 2 beers nightly   Drug use: Never   Sexual activity: Not on file  Other Topics Concern   Not on file  Social History Narrative   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        Right Handed   Lives in two story home    Social Determinants of Health   Financial Resource Strain: Low Risk  (06/04/2022)   Overall Financial Resource Strain (CARDIA)    Difficulty of Paying Living Expenses: Not hard at all  Food Insecurity: No Food Insecurity (06/04/2022)   Hunger Vital Sign    Worried About Running Out of Food in the Last Year: Never true    Ran Out of Food in the Last Year: Never true  Transportation Needs: No Transportation Needs (06/04/2022)   PRAPARE - Administrator, Civil Service (Medical): No    Lack of Transportation (Non-Medical): No  Physical Activity: Inactive (06/04/2022)   Exercise Vital Sign    Days of Exercise per Week: 0 days    Minutes of Exercise per Session: 0 min  Stress: No Stress Concern Present (06/04/2022)   [employer] of Occupational Health - Occupational Stress Questionnaire    Feeling of Stress : Not at all  Social Connections: Moderately Integrated (06/04/2022)   Social Connection and Isolation Panel [NHANES]    Frequency of Communication with Friends and Family: More than three times a week    Frequency of Social Gatherings with Friends and Family: More than three times a week    Attends  Religious Services: Never    Database administrator or Organizations: Yes    Attends Engineer, structural: More than 4 times per year    Marital Status: Married  Catering manager Violence: Not At Risk (06/04/2022)   Humiliation, Afraid, Rape, and Kick questionnaire    Fear of Current or Ex-Partner: No    Emotionally Abused: No    Physically Abused: No    Sexually Abused: No    Family History:   The patient's family history includes Aneurysm (age of onset: 71) in his mother; CAD (age of onset: 6) in his father; Dementia in an other family member; Healthy in his brother; Heart disease in his sister.    ROS:  Please see the history of present illness.  All other ROS reviewed and negative.     Physical Exam/Data:   Vitals:   08/29/23 1245 08/29/23 1300 08/29/23 1315 08/29/23 1330  BP: 96/78 97/71 96/79  124/79  Pulse: 88 87 84 87  Resp: 16 20 17 17   Temp:      TempSrc:      SpO2: 99% 100% 98% 100%   No intake or output data in the 24 hours ending 08/29/23 1411    08/29/2023    9:05 AM 08/29/2023    8:05 AM 08/02/2023    8:39 AM  Last 3 Weights  Weight (lbs) 186 lb 12.8 oz 184 lb 179 lb  Weight (kg) 84.732 kg 83.462 kg 81.194 kg     There is no height or weight on file to calculate BMI.  General:  Well nourished, well developed, in no acute distress. Sitting comfortably on the side of the bed  HEENT: normal Neck: no JVD Vascular: Radial pulses 2+ bilaterally   Cardiac:  normal S1, S2; RRR; no murmur  Lungs:  clear to auscultation bilaterally, no wheezing, rhonchi or rales. Normal work of breathing on  room air   Ext: no edema in BLE  Musculoskeletal:  No deformities, BUE and BLE strength normal and equal Skin: warm and dry  Neuro:  No focal abnormalities noted Psych:  Normal affect    EKG:  The ECG that was done 11/14 was personally reviewed and demonstrates Normal sinus rhythm with HR 91 BPM, baseline wander, RSR' in lead V1   Relevant CV Studies: Cardiac  Studies & Procedures   CARDIAC CATHETERIZATION  CARDIAC CATHETERIZATION 11/05/2019  Narrative Images from the original result were not included.   Mid RCA lesion is 50% stenosed.  Prox Cx lesion is 90% stenosed.  Previously placed Mid Cx stent (unknown type) is widely patent.  Ost LAD to Prox LAD lesion is 80% stenosed.  Mid LAD lesion is 80% stenosed.  1st Diag lesion is 90% stenosed.  A drug-eluting stent was successfully placed using a SYNERGY XD 3.0X16.  Origin to Prox Graft lesion is 60% stenosed.  Post intervention, there is a 0% residual stenosis.  The left ventricular systolic function is normal.  LV end diastolic pressure is normal.  The left ventricular ejection fraction is 55-65% by visual estimate.  KODIAK GRIFFIN is a 62 y.o. male   [id number] LOCATION:  FACILITY: [facility] PHYSICIAN: Nanetta Batty, M.D. 03-Sep-1961   DATE OF PROCEDURE:  11/05/2019  DATE OF DISCHARGE:     CARDIAC CATHETERIZATION / PCI DES SVG Diag    History obtained from chart review.  Mr. Schetter is a 62 year old married Caucasian male patient of Dr. Kem Parkinson has had multiple stents remotely ultimately underwent CABG x3 at [facility] in 2013 with a LIMA to his LAD, vein to a diagonal branch and obtuse marginal branch.  Other problems include hypertension and hyperlipidemia.  He said several weeks of exertional chest pain and dyspnea.  He was referred by Dr. Tomie China for diagnostic coronary angiography.  In addition, because of his hypertension it was requested that he had renal angiography as well to rule out renal vascular hypertension.   PROCEDURE DESCRIPTION:  The patient was brought to the second floor [facility] cath lab in the postabsorptive state. He was premedicated with IV Versed and fentanyl. His right groinwas prepped and shaved in usual sterile fashion. Xylocaine 1% was used for local anesthesia. A 6 French sheath was inserted into the right common  femoral artery using standard Seldinger technique.  5 French right and left Judkins diagnostic catheters on with a 5 French pigtail catheter used for selective coronary angiography, selective vein graft and IMA graft angiography as well as left ventriculography.  Distal abdominal aortography was also performed.  Isovue dye was used for the entirety of the case.  Retrograde aorta, ventricular and pullback pressures were recorded.  The patient received 60 mg of p.o. Plavix, 20 mg of IV Pepcid and 13,500 units of heparin with an ACT of 274.  Using a 6 French left bypass graft catheter, 0.14 Prowater guidewire and a 3 mm x 16 mm long Synergy drug-eluting stent the origin of the vein graft was engaged.  There was significant damping and slow flow which resolved with disengagement suggesting this was indeed a hemodynamically significant lesion.  I carefully positioned the stent across the origin of the graft and deployed it at 14 to 16 atm.  I postdilated with a 3.25 mm x 12 mm long noncompliant balloon up to 14 atm (3.3 mm) resulting in reduction of a 60 to 70% ostial first diagonal branch SVG to 0% residual.  Patient tolerated procedure well.  Guidewire and catheter were removed.  The sheath was secured in place.  The patient left lab in stable condition.  Impression Mr. Carotenuto had patent grafts with a significant lesion at the origin of the of the SVG to the first diagonal branch.  He also had a 40 to 50% segmental mid to distal RCA stenosis at the genu that did not appear to be physiologically significant.  The LIMA to the LAD was patent and the vein to the distal marginal branch was as well.  I reviewed his angiograms with Dr. Katrinka Blazing and we both agreed to proceed with diagonal branch intervention which was successful using a synergy drug-eluting stent.  Given the low risk nature of the procedure, the ease of access I feel comfortable send the patient home today as an outpatient.  He will see Dr. Tomie China back in the  office in 2 to 3 weeks.  He will need dual antiplatelet therapy with aspirin Plavix uninterrupted for at least 12 months.  Nanetta Batty. MD, [facility] 11/05/2019 11:23 AM  Findings Coronary Findings Diagnostic  Dominance: Right  Left Anterior Descending Ost LAD to Prox LAD lesion is 80% stenosed. Mid LAD lesion is 80% stenosed. The lesion was previously treated.  First Diagonal Branch 1st Diag lesion is 90% stenosed.  Left Circumflex Prox Cx lesion is 90% stenosed. Previously placed Mid Cx stent (unknown type) is widely patent.  Right Coronary Artery Mid RCA lesion is 50% stenosed.  Graft To 3rd Mrg  LIMA Graft To Dist LAD  Graft To 1st Diag Origin to Prox Graft lesion is 60% stenosed.  Intervention  Origin to Prox Graft lesion (Graft To 1st Diag) Stent Lesion crossed with guidewire. A drug-eluting stent was successfully placed using a SYNERGY XD 3.0X16. Stent strut is well apposed. Stent does not overlap previously placed stentPost-stent angioplasty was performed. Post-Intervention Lesion Assessment The intervention was successful. Pre-interventional TIMI flow is 3. Post-intervention TIMI flow is 3. No complications occurred at this lesion. There is a 0% residual stenosis post intervention.   STRESS TESTS  MYOCARDIAL PERFUSION IMAGING 03/22/2022  Narrative   Findings are consistent with ischemia. The study is low risk.   LV perfusion is abnormal. Defect 1: There is a small defect with mild reduction in uptake present in the inferior location(s) that is reversible. There is normal wall motion in the defect area. Consistent with ischemia.   Left ventricular function is normal. Nuclear stress EF: 72 %. The left ventricular ejection fraction is hyperdynamic (>65%). End diastolic cavity size is normal.   Prior study not available for comparison.   ECHOCARDIOGRAM  ECHOCARDIOGRAM COMPLETE 12/06/2020  Narrative ECHOCARDIOGRAM REPORT    Patient Name:   Jon Gonzales Date  of Exam: 12/06/2020 Medical Rec #:  [id number]     Height:       70.0 in Accession #:    [id number]    Weight:       215.0 lb Date of Birth:  1960-12-08     BSA:          2.152 m Patient Age:    59 years      BP:           128/84 mmHg Patient Gender: M             HR:           73 bpm. Exam Location:  [facility]  Procedure: 2D Echo  Indications:    Syncope 780.2 / R55  History:        Patient has prior history of Echocardiogram examinations, most recent 02/27/2017. TIA, Signs/Symptoms:Chest Pain; Risk Factors:Hypertension and Dyslipidemia.  Sonographer:    Louie Boston Referring Phys: [id number] KARDIE TOBB  IMPRESSIONS   1. GLS: -16.5. Left ventricular ejection fraction, by estimation, is 60 to 65%. The left ventricle has normal function. The left ventricle has no regional wall motion abnormalities. Left ventricular diastolic parameters were normal. 2. Right ventricular systolic function is normal. The right ventricular size is normal. There is normal pulmonary artery systolic pressure. 3. The mitral valve is normal in structure. Mild mitral valve regurgitation. No evidence of mitral stenosis. 4. The aortic valve is normal in structure. Aortic valve regurgitation is not visualized. No aortic stenosis is present. 5. The inferior vena cava is normal in size with greater than 50% respiratory variability, suggesting right atrial pressure of 3 mmHg.  FINDINGS Left Ventricle: GLS: -16.5. Left ventricular ejection fraction, by estimation, is 60 to 65%. The left ventricle has normal function. The left ventricle has no regional wall motion abnormalities. The left ventricular internal cavity size was normal in size. There is borderline left ventricular hypertrophy. Left ventricular diastolic parameters were normal.  Right Ventricle: The right ventricular size is normal. No increase in right ventricular wall thickness. Right ventricular systolic function is normal. There is normal pulmonary artery  systolic pressure. The tricuspid regurgitant velocity is 2.40 m/s, and with an assumed right atrial pressure of 3 mmHg, the estimated right ventricular systolic pressure is 26.0 mmHg.  Left Atrium: Left atrial size was normal in size.  Right Atrium: Right atrial size was normal in size.  Pericardium: There is no evidence of pericardial effusion.  Mitral Valve: The mitral valve is normal in structure. Mild mitral valve regurgitation. No evidence of mitral valve stenosis.  Tricuspid Valve: The tricuspid valve is normal in structure. Tricuspid valve regurgitation is mild . No evidence of tricuspid stenosis.  Aortic Valve: The aortic valve is normal in structure. Aortic valve regurgitation is not visualized. No aortic stenosis is present.  Pulmonic Valve: The pulmonic valve was normal in structure. Pulmonic valve regurgitation is not visualized. No evidence of pulmonic stenosis.  Aorta: The aortic root is normal in size and structure.  Venous: The inferior vena cava is normal in size with greater than 50% respiratory variability, suggesting right atrial pressure of 3 mmHg.  IAS/Shunts: No atrial level shunt detected by color flow Doppler.   LEFT VENTRICLE PLAX 2D LVIDd:         5.80 cm  Diastology LVIDs:         3.30 cm  LV e' medial:    6.96 cm/s LV PW:         1.10 cm  LV E/e' medial:  14.4 LV IVS:        1.10 cm  LV e' lateral:   9.68 cm/s LVOT diam:     2.10 cm  LV E/e' lateral: 10.3 LV SV:         98 LV SV Index:   46 LVOT Area:     3.46 cm   RIGHT VENTRICLE         IVC TAPSE (M-mode): 1.9 cm  IVC diam: 1.80 cm  LEFT ATRIUM           Index       RIGHT ATRIUM           Index LA diam:      4.70 cm 2.18 cm/m  RA Area:     15.20 cm LA Vol (A2C): 37.6 ml 17.47 ml/m RA Volume:   34.00 ml  15.80 ml/m AORTIC VALVE LVOT Vmax:   159.00 cm/s LVOT Vmean:  96.100 cm/s LVOT VTI:    0.283 m  AORTA Ao Root diam: 3.20 cm Ao Asc diam:  3.50 cm Ao Desc diam: 2.40 cm  MITRAL  VALVE               TRICUSPID VALVE MV Area (PHT): 3.03 cm    TR Peak grad:   23.0 mmHg MV Decel Time: 250 msec    TR Vmax:        240.00 cm/s MV E velocity: 99.90 cm/s MV A velocity: 82.00 cm/s  SHUNTS MV E/A ratio:  1.22        Systemic VTI:  0.28 m Systemic Diam: 2.10 cm  [name] MD Electronically signed by [name] MD Signature Date/Time: 12/06/2020/3:49:04 PM    Final    MONITORS  LONG TERM MONITOR (3-14 DAYS) 12/01/2020  Narrative The patient wore the monitor for 12 days 3 hours starting November 09, 2020. Indication: Dizziness  The minimum heart rate was 48 bpm, maximum heart rate was 108 bpm, and average heart rate was 68 bpm. Predominant underlying rhythm was Sinus Rhythm.  Premature atrial complexes were rare less than 1%. Premature Ventricular complexes were rare less than 1%.  No no ventricular tachycardia, no pauses, No AV block, no supraventricular tachycardia and no atrial fibrillation present. No patient triggered events or diary events reported.  Conclusion: Normal/unremarkable study            Laboratory Data:  High Sensitivity Troponin:   Recent Labs  Lab 08/29/23 1040 08/29/23 1249  TROPONINIHS 5 4      Chemistry Recent Labs  Lab 08/29/23 1040  NA 140  K 3.9  CL 106  CO2 27  GLUCOSE 75  BUN 19  CREATININE 1.00  CALCIUM 8.4*  GFRNONAA >60  ANIONGAP 7    No results for input(s): "PROT", "ALBUMIN", "AST", "ALT", "ALKPHOS", "BILITOT" in the last 168 hours. Lipids No results for input(s): "CHOL", "TRIG", "HDL", "LABVLDL", "LDLCALC", "CHOLHDL" in the last 168 hours. Hematology Recent Labs  Lab 08/29/23 1040  WBC 9.5  RBC 3.90*  HGB 13.3  HCT 40.0  MCV 102.6*  MCH 34.1*  MCHC 33.3  RDW 14.1  PLT 159   Thyroid No results for input(s): "TSH", "FREET4" in the last 168 hours. BNPNo results for input(s): "BNP", "PROBNP" in the last 168 hours.  DDimer No results for input(s): "DDIMER" in the last 168  hours.   Radiology/Studies:  DG Chest 2 View  Result Date: 08/29/2023 CLINICAL DATA:  Chest pain beginning last night. Coronary artery disease. EXAM: CHEST - 2 VIEW COMPARISON:  10/26/2019 FINDINGS: The heart size and mediastinal contours are within normal limits. Prior CABG again noted. Both lungs are clear. The visualized skeletal structures are unremarkable. IMPRESSION: No active cardiopulmonary disease. Electronically Signed   By: Danae Orleans M.D.   On: 08/29/2023 13:38     Assessment and Plan:   Unstable Angina CAD  - Patient has had multiple PCIs in the past. Then underwent CABG in 2012 with LIMA-LAD, SVG-diagonal, SVG-OM. In 10/2019, patient underwent cardiac catheterization that revealed a significant lesion at the origin of the SVG-diagonal that was treated with DES - Most recent ischemic evaluation was a nuclear stress test in 03/2022 that showed a small area of infarction, but was overall low  risk - Over the past 3 weeks, patient has been having intermittent episodes of chest pain. Initially were very infrequent, but he has had 3 episodes in the past 2 days. Pain relieved by nitroglycerin. Feels similar to pain he had prior to PCIs in the past  - in the ED, hsTn negative x1. Repeat pending - EKG nonischemic - Presentation overall consistent with unstable angina. Plan to proceed with LHC this afternoon  - Ordered echocardiogram  - Continue ASA, plavix - Continue crestor, zetia  - Continue metoprolol 50 mg daily, imdur 60 mg daily, ranexa 1000 mg BID   HTN  - Continue metoprolol succinate 50 mg daily and imdur 60 mg daily   HLD  - Lipid panel from 05/2023 showed LDL 38, HDL 41, triglycerides 109, total cholesterol 99 - Continue crestor 40 mg daily, zetia 10 mg daily     Risk Assessment/Risk Scores:    TIMI Risk Score for Unstable Angina or Non-ST Elevation MI:   The patient's TIMI risk score is 2, which indicates a 8% risk of all cause mortality, new or recurrent  myocardial infarction or need for urgent revascularization in the next 14 days.    Code Status: Full Code  Severity of Illness: The appropriate patient status for this patient is OBSERVATION. Observation status is judged to be reasonable and necessary in order to provide the required intensity of service to ensure the patient's safety. The patient's presenting symptoms, physical exam findings, and initial radiographic and laboratory data in the context of their medical condition is felt to place them at decreased risk for further clinical deterioration. Furthermore, it is anticipated that the patient will be medically stable for discharge from the hospital within 2 midnights of admission.    For questions or updates, please contact [facility] Please consult [email or web address] for contact info under     Signed, Jonita Albee, PA-C  08/29/2023 2:11 PM

## 2023-08-29 NOTE — Assessment & Plan Note (Signed)
With frequent chest pain symptoms at this time and given his underlying history of significant CAD history, concerning for unstable angina.  May be somewhat related to missing doses of ranolazine over the last few days but with acute symptoms, would recommend aggressive management.  Recommended further evaluation in the ER and possibly to be assessed with serial troponins and coronary angiogram.  His last meal was a drink of Ensure protein shake earlier in the morning.  He is currently hemodynamically stable and they feel comfortable going in private vehicle to the [facility], ER.  Dr. Sedalia Muta will be driving him. I called and d/w cardiology cath lab co-ordinator at [facility], to help facilitate expedited assessment in the ER and notify inpatient cardiology team.

## 2023-08-30 ENCOUNTER — Encounter (HOSPITAL_COMMUNITY): Payer: Self-pay | Admitting: Cardiovascular Disease

## 2023-08-30 ENCOUNTER — Other Ambulatory Visit (HOSPITAL_BASED_OUTPATIENT_CLINIC_OR_DEPARTMENT_OTHER): Payer: Self-pay

## 2023-09-04 ENCOUNTER — Other Ambulatory Visit: Payer: Self-pay | Admitting: Physician Assistant

## 2023-09-04 DIAGNOSIS — I25118 Atherosclerotic heart disease of native coronary artery with other forms of angina pectoris: Secondary | ICD-10-CM

## 2023-09-04 MED ORDER — ISOSORBIDE MONONITRATE ER 60 MG PO TB24: ORAL_TABLET | ORAL | Status: DC

## 2023-09-04 MED ORDER — NITROGLYCERIN 0.4 MG SL SUBL: 0.4000 mg | SUBLINGUAL_TABLET | SUBLINGUAL | 1 refills | Status: DC | PRN

## 2023-09-05 ENCOUNTER — Ambulatory Visit: Payer: 59

## 2023-09-05 ENCOUNTER — Ambulatory Visit: Payer: 59 | Admitting: Physician Assistant

## 2023-09-17 DIAGNOSIS — Z7901 Long term (current) use of anticoagulants: Secondary | ICD-10-CM | POA: Diagnosis not present

## 2023-09-17 DIAGNOSIS — M461 Sacroiliitis, not elsewhere classified: Secondary | ICD-10-CM | POA: Diagnosis not present

## 2023-09-19 ENCOUNTER — Other Ambulatory Visit: Payer: Self-pay

## 2023-09-19 ENCOUNTER — Encounter: Payer: Self-pay | Admitting: Psychiatry

## 2023-09-19 ENCOUNTER — Other Ambulatory Visit (HOSPITAL_COMMUNITY): Payer: Self-pay

## 2023-09-19 ENCOUNTER — Ambulatory Visit: Payer: 59 | Admitting: Psychiatry

## 2023-09-19 DIAGNOSIS — R4189 Other symptoms and signs involving cognitive functions and awareness: Secondary | ICD-10-CM

## 2023-09-19 DIAGNOSIS — F411 Generalized anxiety disorder: Secondary | ICD-10-CM

## 2023-09-19 DIAGNOSIS — F4001 Agoraphobia with panic disorder: Secondary | ICD-10-CM

## 2023-09-19 DIAGNOSIS — F314 Bipolar disorder, current episode depressed, severe, without psychotic features: Secondary | ICD-10-CM

## 2023-09-19 MED ORDER — LITHIUM CARBONATE ER 300 MG PO TBCR
300.0000 mg | EXTENDED_RELEASE_TABLET | Freq: Two times a day (BID) | ORAL | 0 refills | Status: DC
  Filled 2023-09-19: qty 180, 90d supply, fill #0
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
## 2023-09-19 NOTE — Progress Notes (Signed)
Jon Gonzales [id number] [birth year] 62 y.o.  Subjective:   Patient ID:  Jon Gonzales is a 62 y.o. (DOB 05-19-1961) male.  Chief Complaint:  Chief Complaint  Patient presents with   Follow-up    HPI Jon Gonzales presents to the office today for follow-up of bipolar 1, generalized anxiety disorder, panic disorder, pseudodementia.  First seen in this office 06/21/2023.  He was encouraged to stop cyclobenzaprine to see if concentration and memory might be better and start Vraylar 1.5 mg for 10 days and if no improvement in depression increase to 3 mg daily.  Wife is Dr. Baxter Hire [place] who reports that his depression and anxiety are improved but his memory is not.  She notes resolution of suicidal ideation. He agrees with wife's observation.   He says maybe back is a little worse.  Is off cyclobenzaprine.  Psych meds:  duloxetine 120 mg daily, lithium 300 BID, lorazepam 1 mg BID and prn, Vraylar 3 mg daily. No SE known.   Vraylar seemed to work pretty good.  Less anxious and keyed up.  Dep is lighter.  Dep is 3/10 and was 8/10 before.  No SI.  Definitely a change.   Still having the memory problems.  Not sure if he's on Robaxin.  Didn't recognize he had an appt until he got here today.   No problems with work.  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 To help sleep takes OTC , melatonin.   Sleep good usually.  No Sleep apnea and neg sleep study per Dr. Richardean Chimera. No recent panic or Meniere's attacks lately.   Plan: (NAC) N-Acetylcysteine 2 of the  600 mg capsules daily to help with mild cognitive problems.  Try reducing the lorazepam morning dose only to 1/2 tablet to see if memory is any better.     09/19/23 appt noted: Psych med:  duloxetine 120 mg daily, lithium 300 BID(out for a week), lorazepam 1 mg BID and prn, Vraylar 3 mg daily.  Forgot about NAC Didn't try reducing lorazepam .  Was nervous about doing so. He thinks he's doing pretty well.  Dep is under  control.   Anxiety is still there but maybe not as bad. Function pretty good aside from back pain.   Sleep well. Enjoyed T'giving with sister in [place].   Past Psychiatric Medication Trials:  Wellbutrin XL 150 ? Anxiety Antidepressants tried around 2008 2012 include sertraline, Lexapro headache, Effexor XR, Pristiq.,  Nefazodone 50 mg twice daily, Trintellix for 2 months 2014 with headache Clonazepam December 2020 to January 2021, lorazepam for years Lamotrigine for 2 months 2012, topiramate, Trileptal briefly 2018 Latuda 5 months 2018 with no apparent response and questionable dose Vraylar 3 mg daily August 2000 01 July 2016 Olanzapine 15 1 month September 2017 Buspirone 30 twice daily no response Mirtazapine May to December 2018 Ambien, Lunesta Cyproheptadine  No history bipolar but 2017 onset mania with rapid speech and drivenness and paranoia. No further manic sx   GAD-7    Flowsheet Row Office Visit from 06/11/2023 in [facility] Office Visit from 04/18/2022 in [facility]  Total GAD-7 Score 6 14      PHQ2-9    Flowsheet Row Office Visit from 06/11/2023 in [facility] Office Visit from 04/18/2022 in [facility] Office Visit from 01/17/2022 in [facility]  PHQ-2 Total Score 3 6 5   PHQ-9  Total Score 9 20 16       Flowsheet Row ED to Hosp-Admission (Discharged) from 08/29/2023 in [facility] CARDIAC CATH LAB ED from 06/12/2023 in [facility]  C-SSRS RISK CATEGORY No Risk No Risk        Review of Systems:  Review of Systems  Cardiovascular:  Negative for chest pain and palpitations.  Psychiatric/Behavioral:  Negative for dysphoric mood and suicidal ideas. The patient is nervous/anxious.     Medications: I have reviewed the patient's current medications.  Current Outpatient Medications  Medication Sig  Dispense Refill   aspirin EC 81 MG tablet Take 1 tablet (81 mg total) by mouth daily. 90 tablet 2   cariprazine (VRAYLAR) 3 MG capsule Take 1 capsule (3 mg total) by mouth daily. 90 capsule 0   clopidogrel (PLAVIX) 75 MG tablet Take 1 tablet (75 mg total) by mouth daily. 90 tablet 1   cyclobenzaprine (FLEXERIL) 10 MG tablet Take 1 tablet (10 mg total) by mouth 3 (three) times daily as needed for muscle spasms. 90 tablet 1   DULoxetine (CYMBALTA) 60 MG capsule Take 1 capsule (60 mg total) by mouth 2 (two) times daily. 180 capsule 2   EPINEPHrine (EPIPEN 2-PAK) 0.3 mg/0.3 mL IJ SOAJ injection Inject 0.3 mg into the muscle as needed for anaphylaxis. 1 each 3   ezetimibe (ZETIA) 10 MG tablet Take 1 tablet (10 mg total) by mouth daily. (Patient taking differently: Take 10 mg by mouth at bedtime.) 90 tablet 2   fexofenadine (ALLEGRA) 60 MG tablet Take 1 tablet (60 mg total) by mouth daily. (Patient taking differently: Take 60 mg by mouth at bedtime.) 90 tablet 2   hydrochlorothiazide (HYDRODIURIL) 12.5 MG tablet Take 1 tablet (12.5 mg total) by mouth daily. 90 tablet 2   isosorbide mononitrate (IMDUR) 60 MG 24 hr tablet 2 po qd     LORazepam (ATIVAN) 1 MG tablet Take 1 tablet (1 mg total) by mouth every 8 (eight) hours as needed for anxiety. (Patient taking differently: Take 1 mg by mouth See admin instructions. Take 1mg  by mouth twice a day. May take an additional dose as needed.) 90 tablet 1   meclizine (ANTIVERT) 25 MG tablet Take 1 tablet (25 mg total) by mouth 3 (three) times daily as needed for dizziness. 90 tablet 2   metoprolol succinate (TOPROL-XL) 50 MG 24 hr tablet Take 1 tablet by mouth daily. Take with or immediately following a meal. 90 tablet 2   Multiple Vitamin (MULTIVITAMIN WITH MINERALS) TABS tablet Take 1 tablet by mouth daily. Men's Multivitamin     nitroGLYCERIN (NITROSTAT) 0.4 MG SL tablet Dissolve 1 tablet (0.4 mg total) under the tongue every 5 (five) minutes up to 3 doses as needed  for chest pain. If no relief after 3 doses call 911. 25 tablet 1   ondansetron (ZOFRAN) 4 MG tablet Take 1 tablet (4 mg total) by mouth every 8 (eight) hours as needed for nausea or vomiting. 20 tablet 0   pantoprazole (PROTONIX) 40 MG tablet Take 1 tablet (40 mg total) by mouth daily. 90 tablet 1   ranolazine (RANEXA) 1000 MG SR tablet Take 1 tablet by mouth 2 times daily. 180 tablet 2   rosuvastatin (CRESTOR) 40 MG tablet Take 1 tablet (40 mg total) by mouth daily. 90 tablet 1   Semaglutide-Weight Management (WEGOVY) 2.4 MG/0.75ML SOAJ Inject 2 mg into the skin once a week. Saturday     lithium carbonate (  LITHOBID) 300 MG ER tablet Take 1 tablet (300 mg total) by mouth 2 (two) times daily. 180 tablet 0   No current facility-administered medications for this visit.    Medication Side Effects: None  Allergies:  Allergies  Allergen Reactions   Trintellix [Vortioxetine] Other (See Comments)    Headaches.   Penicillins Rash    Did it involve swelling of the face/tongue/throat, SOB, or low BP? Unknown Did it involve sudden or severe rash/hives, skin peeling, or any reaction on the inside of your mouth or nose? Unknown Did you need to seek medical attention at a hospital or doctor's office? Unknown When did it last happen? childhood reaction      If all above answers are "NO", may proceed with cephalosporin use.     Past Medical History:  Diagnosis Date   Abnormal EKG 06/13/2023   Abnormal stress test small area of mild ischemia inferior wall 05/24/2022   Aphasia 06/13/2023   Arthropathy of lumbar facet joint 07/03/2023   Bilateral sacroiliitis (HCC) 08/29/2023   Bipolar 1 disorder 02/04/2023   Chronic bilateral low back pain with right-sided sciatica 06/24/2023   Chronic headaches 01/30/2021   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   Coronary artery disease involving coronary bypass graft of native  heart with angina pectoris 05/22/2016   Cath 2013 LAD 95% stenosis, D1 90% stenosis, RCA 20%, Circ 30%. Previous stent to D1 2012. EF 60%.   Degeneration of lumbar intervertebral disc 07/03/2023   Dizziness 10/27/2020   Excessive daytime sleepiness 09/11/2016   Gastroesophageal reflux disease 02/04/2023   Generalized anxiety disorder 05/22/2016   Herpes zoster 01/20/2020   History of small bowel obstruction    Admitted 10-14-2007 for partical small bowel obstruction and N.G. tube was placed. Admitted by Dr Dimas Chyle. 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   Please see review of systems for further details on the patient's review from today.   Objective:   Physical Exam:  There were no vitals taken for this visit.  Physical Exam Constitutional:      General: He is not in acute distress.    Appearance: He is well-developed.  Musculoskeletal:        General: No deformity.  Neurological:     Mental Status: He is alert and oriented to person, place, and time.     Coordination:  Coordination normal.  Psychiatric:        Attention and Perception: Attention and perception normal.        Mood and Affect: Mood is not anxious or depressed. Affect  is not labile, blunt, angry or inappropriate.        Speech: Speech normal.        Behavior: Behavior normal.        Thought Content: Thought content normal. Thought content is not delusional. Thought content does not include homicidal or suicidal ideation. Thought content does not include suicidal plan.        Cognition and Memory: He exhibits impaired recent memory.        Judgment: Judgment normal.     Comments: Less dep and memory concerns today     Lab Review:     Component Value Date/Time   NA 140 08/29/2023 1040   NA 139 06/13/2023 1349   K 3.9 08/29/2023 1040   CL 106 08/29/2023 1040   CO2 27 08/29/2023 1040   GLUCOSE 75 08/29/2023 1040   BUN 19 08/29/2023 1040   BUN 12 06/13/2023 1349   CREATININE 1.00 08/29/2023 1040   CALCIUM 8.4 (L) 08/29/2023 1040   PROT 6.5 06/13/2023 1349   ALBUMIN 4.2 06/13/2023 1349   AST 25 06/13/2023 1349   ALT 26 06/13/2023 1349   ALKPHOS 54 06/13/2023 1349   BILITOT 0.4 06/13/2023 1349   GFRNONAA >60 08/29/2023 1040   GFRAA 77 11/15/2020 0809       Component Value Date/Time   WBC 9.5 08/29/2023 1040   RBC 3.90 (L) 08/29/2023 1040   HGB 13.3 08/29/2023 1040   HGB 13.8 06/13/2023 1349   HCT 40.0 08/29/2023 1040   HCT 40.8 06/13/2023 1349   PLT 159 08/29/2023 1040   PLT 139 (L) 06/13/2023 1349   MCV 102.6 (H) 08/29/2023 1040   MCV 101 (H) 06/13/2023 1349   MCH 34.1 (H) 08/29/2023 1040   MCHC 33.3 08/29/2023 1040   RDW 14.1 08/29/2023 1040   RDW 13.4 06/13/2023 1349   LYMPHSABS 1.4 06/13/2023 1349   MONOABS 0.5 06/12/2023 1035   EOSABS 0.1 06/13/2023 1349   BASOSABS 0.0 06/13/2023 1349    Lithium Lvl  Date Value Ref Range Status  02/05/2023 0.9 0.5 - 1.2 mmol/L Final    Comment:    A concentration of 0.5-0.8 mmol/L is advised for long-term  use; concentrations of up to 1.2 mmol/L may be necessary during acute treatment.                                  Detection Limit = 0.1                           <0.1 indicates None Detected    02/05/23 lithium level 0.9 stable sine 2021  05/31/23 D 51, B12 786, TSH 3.86 08/30/23 EKG normal QTc  .res Assessment: Plan:    Corrinne Eagle" was seen today for follow-up.  Diagnoses and all orders for this visit:  Bipolar I disorder with depression, severe (HCC) -     Lithium level -     lithium carbonate (LITHOBID) 300 MG ER tablet; Take 1 tablet (300 mg total) by mouth 2 (two) times daily.  Generalized anxiety disorder  Panic disorder with agoraphobia  Pseudodementia    He is less depressed with changes but still some memory concerns and residual dep perhaps.  We discussed the short-term risks associated with benzodiazepines including sedation and increased fall risk among others.  Discussed long-term side effect risk including dependence, potential withdrawal symptoms, and the potential eventual  dose-related risk of dementia.  But recent studies from 2020 dispute this association between benzodiazepines and dementia risk. Newer studies in 2020 do not support an association with dementia. Rec try reducing AM lorazepam to see if memory is better.  Lab workup memory px per neuro was unremarkable:  05/31/23 D 51, B12 786, TSH 3.86  Consider reducing lithium slightly to see if memory could be better.  Should be able to get by with less bc not manic and taking another mood stabilizer, Vraylar. Check lithium level first.  Continue other meds for now including Vraylar 3, duloxetine 120, lithobid 300 BID, lorazepam except the following:    (NAC) N-Acetylcysteine 2 of the  600 mg capsules daily to help with mild cognitive problems.   Option  Try reducing the lorazepam morning dose only to 1/2 tablet to see if memory is any better.   He's reluctant  FU 10 weeks.  Meredith Staggers, MD,  DFAPA   Please see After Visit Summary for patient specific instructions.  Future Appointments  Date Time Provider Department Center  09/20/2023  1:30 PM Marcos Eke, PA-C [facility] None    Orders Placed This Encounter  Procedures   Lithium level    -------------------------------

## 2023-09-20 ENCOUNTER — Ambulatory Visit: Payer: 59 | Admitting: Physician Assistant

## 2023-09-20 ENCOUNTER — Other Ambulatory Visit: Payer: 59

## 2023-09-20 ENCOUNTER — Encounter: Payer: Self-pay | Admitting: Physician Assistant

## 2023-09-20 VITALS — BP 117/78 | HR 82 | Resp 20 | Ht 70.0 in

## 2023-09-20 DIAGNOSIS — R413 Other amnesia: Secondary | ICD-10-CM

## 2023-09-20 NOTE — Patient Instructions (Addendum)
It was a pleasure to see you today at our office.   Recommendations:    Recommend B1   Repeat neuropsych Follow up pending on the results  at [facility] [phone]   For psychiatric meds, mood meds: Please have your primary care physician manage these medications.  If you have any severe symptoms of a stroke, or other severe issues such as confusion,severe chills or fever, etc call 911 or go to the ER as you may need to be evaluated further    For assessment of decision of mental capacity and competency:  Call Dr. Erick Blinks, geriatric psychiatrist at [phone]  Counseling regarding caregiver distress, including caregiver depression, anxiety and issues regarding community resources, adult day care programs, adult living facilities, or memory care questions:  please contact your  Primary Doctor's Social Worker   Whom to call: Memory  decline, memory medications: Call our office [phone]    [email or web address]      RECOMMENDATIONS FOR ALL PATIENTS WITH MEMORY PROBLEMS: 1. Continue to exercise (Recommend 30 minutes of walking everyday, or 3 hours every week) 2. Increase social interactions - continue going to [place] and enjoy social gatherings with friends and family 3. Eat healthy, avoid fried foods and eat more fruits and vegetables 4. Maintain adequate blood pressure, blood sugar, and blood cholesterol level. Reducing the risk of stroke and cardiovascular disease also helps promoting better memory. 5. Avoid stressful situations. Live a simple life and avoid aggravations. Organize your time and prepare for the next day in anticipation. 6. Sleep well, avoid any interruptions of sleep and avoid any distractions in the bedroom that may interfere with adequate sleep quality 7. Avoid sugar, avoid sweets as there is a strong link between excessive sugar intake, diabetes, and cognitive impairment We discussed the  Mediterranean diet, which has been shown to help patients reduce the risk of progressive memory disorders and reduces cardiovascular risk. This includes eating fish, eat fruits and green leafy vegetables, nuts like almonds and hazelnuts, walnuts, and also use olive oil. Avoid fast foods and fried foods as much as possible. Avoid sweets and sugar as sugar use has been linked to worsening of memory function.  There is always a concern of gradual progression of memory problems. If this is the case, then we may need to adjust level of care according to patient needs. Support, both to the patient and caregiver, should then be put into place.          DRIVING: Regarding driving, in patients with progressive memory problems, driving will be impaired. We advise to have someone else do the driving if trouble finding directions or if minor accidents are reported. Independent driving assessment is available to determine safety of driving.   If you are interested in the driving assessment, you can contact the following:  [facility] [employer] [facility] in [place] [phone]  [facility] [phone]  [facility] [phone]  [place] [phone] or [phone]   FALL PRECAUTIONS: Be cautious when walking. Scan the area for obstacles that may increase the risk of trips and falls. When getting up in the mornings, sit up at the edge of the bed for a few minutes before getting out of bed. Consider elevating the bed at the head end to avoid drop of blood pressure when getting up. Walk always in a well-lit room (use night lights in the walls). Avoid area rugs or power cords from appliances in the middle of the walkways. Use a walker or a cane  if necessary and consider physical therapy for balance exercise. Get your eyesight checked regularly.  FINANCIAL OVERSIGHT: Supervision, especially oversight when making financial decisions or transactions is also  recommended.  HOME SAFETY: Consider the safety of the kitchen when operating appliances like stoves, microwave oven, and blender. Consider having supervision and share cooking responsibilities until no longer able to participate in those. Accidents with firearms and other hazards in the house should be identified and addressed as well.   ABILITY TO BE LEFT ALONE: If patient is unable to contact 911 operator, consider using LifeLine, or when the need is there, arrange for someone to stay with patients. Smoking is a fire hazard, consider supervision or cessation. Risk of wandering should be assessed by caregiver and if detected at any point, supervision and safe proof recommendations should be instituted.  MEDICATION SUPERVISION: Inability to self-administer medication needs to be constantly addressed. Implement a mechanism to ensure safe administration of the medications.      Mediterranean Diet A Mediterranean diet refers to food and lifestyle choices that are based on the traditions of countries located on the [employer]. This way of eating has been shown to help prevent certain conditions and improve outcomes for people who have chronic diseases, like kidney disease and heart disease. What are tips for following this plan? Lifestyle  Cook and eat meals together with your family, when possible. Drink enough fluid to keep your urine clear or pale yellow. Be physically active every day. This includes: Aerobic exercise like running or swimming. Leisure activities like gardening, walking, or housework. Get 7-8 hours of sleep each night. If recommended by your health care provider, drink red wine in moderation. This means 1 glass a day for nonpregnant women and 2 glasses a day for men. A glass of wine equals 5 oz (150 mL). Reading food labels  Check the serving size of packaged foods. For foods such as rice and pasta, the serving size refers to the amount of cooked product, not  dry. Check the total fat in packaged foods. Avoid foods that have saturated fat or trans fats. Check the ingredients list for added sugars, such as corn syrup. Shopping  At the grocery store, buy most of your food from the areas near the walls of the store. This includes: Fresh fruits and vegetables (produce). Grains, beans, nuts, and seeds. Some of these may be available in unpackaged forms or large amounts (in bulk). Fresh seafood. Poultry and eggs. Low-fat dairy products. Buy whole ingredients instead of prepackaged foods. Buy fresh fruits and vegetables in-season from local farmers markets. Buy frozen fruits and vegetables in resealable bags. If you do not have access to quality fresh seafood, buy precooked frozen shrimp or canned fish, such as tuna, salmon, or sardines. Buy small amounts of raw or cooked vegetables, salads, or olives from the deli or salad bar at your store. Stock your pantry so you always have certain foods on hand, such as olive oil, canned tuna, canned tomatoes, rice, pasta, and beans. Cooking  Cook foods with extra-virgin olive oil instead of using butter or other vegetable oils. Have meat as a side dish, and have vegetables or grains as your main dish. This means having meat in small portions or adding small amounts of meat to foods like pasta or stew. Use beans or vegetables instead of meat in common dishes like chili or lasagna. Experiment with different cooking methods. Try roasting or broiling vegetables instead of steaming or sauteing them. Add frozen vegetables to  soups, stews, pasta, or rice. Add nuts or seeds for added healthy fat at each meal. You can add these to yogurt, salads, or vegetable dishes. Marinate fish or vegetables using olive oil, lemon juice, garlic, and fresh herbs. Meal planning  Plan to eat 1 vegetarian meal one day each week. Try to work up to 2 vegetarian meals, if possible. Eat seafood 2 or more times a week. Have healthy snacks  readily available, such as: Vegetable sticks with hummus. Greek yogurt. Fruit and nut trail mix. Eat balanced meals throughout the week. This includes: Fruit: 2-3 servings a day Vegetables: 4-5 servings a day Low-fat dairy: 2 servings a day Fish, poultry, or lean meat: 1 serving a day Beans and legumes: 2 or more servings a week Nuts and seeds: 1-2 servings a day Whole grains: 6-8 servings a day Extra-virgin olive oil: 3-4 servings a day Limit red meat and sweets to only a few servings a month What are my food choices? Mediterranean diet Recommended Grains: Whole-grain pasta. Brown rice. Bulgar wheat. Polenta. Couscous. Whole-wheat bread. Orpah Cobb. Vegetables: Artichokes. Beets. Broccoli. Cabbage. Carrots. Eggplant. Green beans. Chard. Kale. Spinach. Onions. Leeks. Peas. Squash. Tomatoes. Peppers. Radishes. Fruits: Apples. Apricots. Avocado. Berries. Bananas. Cherries. Dates. Figs. Grapes. Lemons. Melon. Oranges. Peaches. Plums. Pomegranate. Meats and other protein foods: Beans. Almonds. Sunflower seeds. Pine nuts. Peanuts. Cod. Salmon. Scallops. Shrimp. Tuna. Tilapia. Clams. Oysters. Eggs. Dairy: Low-fat milk. Cheese. Greek yogurt. Beverages: Water. Red wine. Herbal tea. Fats and oils: Extra virgin olive oil. Avocado oil. Grape seed oil. Sweets and desserts: [place] yogurt with honey. Baked apples. Poached pears. Trail mix. Seasoning and other foods: Basil. Cilantro. Coriander. Cumin. Mint. Parsley. Sage. Rosemary. Tarragon. Garlic. Oregano. Thyme. Pepper. Balsalmic vinegar. Tahini. Hummus. Tomato sauce. Olives. Mushrooms. Limit these Grains: Prepackaged pasta or rice dishes. Prepackaged cereal with added sugar. Vegetables: Deep fried potatoes (french fries). Fruits: Fruit canned in syrup. Meats and other protein foods: Beef. Pork. Lamb. Poultry with skin. Hot dogs. Tomasa Blase. Dairy: Ice cream. Sour cream. Whole milk. Beverages: Juice. Sugar-sweetened soft drinks. Beer. Liquor and  spirits. Fats and oils: Butter. Canola oil. Vegetable oil. Beef fat (tallow). Lard. Sweets and desserts: Cookies. Cakes. Pies. Candy. Seasoning and other foods: Mayonnaise. Premade sauces and marinades. The items listed may not be a complete list. Talk with your dietitian about what dietary choices are right for you. Summary The Mediterranean diet includes both food and lifestyle choices. Eat a variety of fresh fruits and vegetables, beans, nuts, seeds, and whole grains. Limit the amount of red meat and sweets that you eat. Talk with your health care provider about whether it is safe for you to drink red wine in moderation. This means 1 glass a day for nonpregnant women and 2 glasses a day for men. A glass of wine equals 5 oz (150 mL). This information is not intended to replace advice given to you by your health care provider. Make sure you discuss any questions you have with your health care provider. Document Released: 05/24/2016 Document Revised: 06/26/2016 Document Reviewed: 05/24/2016 Elsevier Interactive Patient Education  2017 Elsevier Inc.     Limit use of pain relievers to no more than 2 days out of the week.  These medications include acetaminophen, NSAIDs (ibuprofen/Advil/Motrin, naproxen/Aleve, triptans (Imitrex/sumatriptan), Excedrin, and narcotics.  This will help reduce risk of rebound headaches. Be aware of common food triggers:  - Caffeine:  coffee, black tea, cola, Mt. Dew  - Chocolate  - Dairy:  aged cheeses (brie, blue, cheddar,  gouda, Parmasan, provolone, Touchet, Swiss, etc), chocolate milk, buttermilk, sour cream, limit eggs and yogurt  - Nuts, peanut butter  - Alcohol  - Cereals/grains:  FRESH breads (fresh bagels, sourdough, doughnuts), yeast productions  - Processed/canned/aged/cured meats (pre-packaged deli meats, hotdogs)  - MSG/glutamate:  soy sauce, flavor enhancer, pickled/preserved/marinated foods  - Sweeteners:  aspartame (Equal, Nutrasweet).  Sugar and  Splenda are okay  - Vegetables:  legumes (lima beans, lentils, snow peas, fava beans, pinto peans, peas, garbanzo beans), sauerkraut, onions, olives, pickles  - Fruit:  avocados, bananas, citrus fruit (orange, lemon, grapefruit), mango  - Other:  Frozen meals, macaroni and cheese Routine exercise Stay adequately hydrated (aim for 64 oz water daily) Keep headache diary Maintain proper stress management Maintain proper sleep hygiene Do not skip meals Consider supplements:  magnesium citrate 400mg  daily, riboflavin 400mg  daily, coenzyme Q10 100mg  three times daily.

## 2023-09-23 ENCOUNTER — Other Ambulatory Visit: Payer: Self-pay | Admitting: Physician Assistant

## 2023-09-23 ENCOUNTER — Other Ambulatory Visit (HOSPITAL_COMMUNITY): Payer: Self-pay

## 2023-09-23 ENCOUNTER — Other Ambulatory Visit: Payer: Self-pay

## 2023-09-23 DIAGNOSIS — F314 Bipolar disorder, current episode depressed, severe, without psychotic features: Secondary | ICD-10-CM

## 2023-09-23 MED ORDER — LITHIUM CARBONATE ER 300 MG PO TBCR
300.0000 mg | EXTENDED_RELEASE_TABLET | Freq: Two times a day (BID) | ORAL | 1 refills | Status: DC
  Filled 2023-09-23 – 2024-01-22 (×6): qty 180, 90d supply, fill #0

## 2023-09-26 NOTE — Progress Notes (Signed)
B1 is normal, thanks

## 2023-09-30 ENCOUNTER — Other Ambulatory Visit (HOSPITAL_COMMUNITY): Payer: Self-pay

## 2023-09-30 ENCOUNTER — Other Ambulatory Visit: Payer: Self-pay | Admitting: Physician Assistant

## 2023-09-30 MED ORDER — HYDROCHLOROTHIAZIDE 12.5 MG PO TABS
12.5000 mg | ORAL_TABLET | Freq: Every day | ORAL | 2 refills | Status: DC
  Filled 2023-09-30: qty 90, 90d supply, fill #0
  Filled 2023-11-28 – 2024-01-14 (×4): qty 90, 90d supply, fill #1
  Filled 2024-01-20: qty 30, 30d supply, fill #1
  Filled 2024-01-20: qty 90, 90d supply, fill #1
  Filled 2024-01-23 – 2024-02-17 (×3): qty 30, 30d supply, fill #2

## 2023-10-03 ENCOUNTER — Other Ambulatory Visit: Payer: Self-pay

## 2023-10-03 ENCOUNTER — Encounter (HOSPITAL_COMMUNITY): Payer: Self-pay

## 2023-10-03 ENCOUNTER — Emergency Department (HOSPITAL_COMMUNITY)
Admission: EM | Admit: 2023-10-03 | Discharge: 2023-10-03 | Disposition: A | Discharge status: Home / Self Care | Payer: 59 | Attending: Emergency Medicine | Admitting: Emergency Medicine

## 2023-10-03 ENCOUNTER — Emergency Department (HOSPITAL_COMMUNITY): Payer: 59

## 2023-10-03 DIAGNOSIS — R259 Unspecified abnormal involuntary movements: Secondary | ICD-10-CM | POA: Diagnosis not present

## 2023-10-03 DIAGNOSIS — I10 Essential (primary) hypertension: Secondary | ICD-10-CM | POA: Diagnosis not present

## 2023-10-03 DIAGNOSIS — R42 Dizziness and giddiness: Secondary | ICD-10-CM | POA: Diagnosis not present

## 2023-10-03 DIAGNOSIS — Z7902 Long term (current) use of antithrombotics/antiplatelets: Secondary | ICD-10-CM | POA: Diagnosis not present

## 2023-10-03 DIAGNOSIS — Z8673 Personal history of transient ischemic attack (TIA), and cerebral infarction without residual deficits: Secondary | ICD-10-CM | POA: Insufficient documentation

## 2023-10-03 DIAGNOSIS — I2510 Atherosclerotic heart disease of native coronary artery without angina pectoris: Secondary | ICD-10-CM | POA: Insufficient documentation

## 2023-10-03 DIAGNOSIS — R569 Unspecified convulsions: Secondary | ICD-10-CM | POA: Diagnosis not present

## 2023-10-03 DIAGNOSIS — R0902 Hypoxemia: Secondary | ICD-10-CM | POA: Diagnosis not present

## 2023-10-03 DIAGNOSIS — R4182 Altered mental status, unspecified: Secondary | ICD-10-CM | POA: Diagnosis not present

## 2023-10-03 DIAGNOSIS — R519 Headache, unspecified: Secondary | ICD-10-CM

## 2023-10-03 DIAGNOSIS — R258 Other abnormal involuntary movements: Secondary | ICD-10-CM | POA: Diagnosis present

## 2023-10-03 DIAGNOSIS — R4781 Slurred speech: Secondary | ICD-10-CM | POA: Diagnosis not present

## 2023-10-03 DIAGNOSIS — Z79899 Other long term (current) drug therapy: Secondary | ICD-10-CM | POA: Insufficient documentation

## 2023-10-03 DIAGNOSIS — R531 Weakness: Secondary | ICD-10-CM | POA: Diagnosis not present

## 2023-10-03 DIAGNOSIS — R2990 Unspecified symptoms and signs involving the nervous system: Secondary | ICD-10-CM

## 2023-10-03 DIAGNOSIS — F319 Bipolar disorder, unspecified: Secondary | ICD-10-CM

## 2023-10-03 DIAGNOSIS — I4510 Unspecified right bundle-branch block: Secondary | ICD-10-CM | POA: Diagnosis not present

## 2023-10-03 DIAGNOSIS — R29818 Other symptoms and signs involving the nervous system: Secondary | ICD-10-CM | POA: Diagnosis not present

## 2023-10-03 DIAGNOSIS — I213 ST elevation (STEMI) myocardial infarction of unspecified site: Secondary | ICD-10-CM | POA: Diagnosis not present

## 2023-10-03 DIAGNOSIS — Z7982 Long term (current) use of aspirin: Secondary | ICD-10-CM | POA: Insufficient documentation

## 2023-10-03 LAB — PROTIME-INR
INR: 1 (ref 0.8–1.2)
Prothrombin Time: 13.8 s (ref 11.4–15.2)

## 2023-10-03 LAB — I-STAT CHEM 8, ED
BUN: 19 mg/dL (ref 8–23)
Calcium, Ion: 1.02 mmol/L — ABNORMAL LOW (ref 1.15–1.40)
Chloride: 106 mmol/L (ref 98–111)
Creatinine, Ser: 1.1 mg/dL (ref 0.61–1.24)
Glucose, Bld: 143 mg/dL — ABNORMAL HIGH (ref 70–99)
HCT: 46 % (ref 39.0–52.0)
Hemoglobin: 15.6 g/dL (ref 13.0–17.0)
Potassium: 3.9 mmol/L (ref 3.5–5.1)
Sodium: 135 mmol/L (ref 135–145)
TCO2: 18 mmol/L — ABNORMAL LOW (ref 22–32)

## 2023-10-03 LAB — CBC
HCT: 46.9 % (ref 39.0–52.0)
Hemoglobin: 16 g/dL (ref 13.0–17.0)
MCH: 34.2 pg — ABNORMAL HIGH (ref 26.0–34.0)
MCHC: 34.1 g/dL (ref 30.0–36.0)
MCV: 100.2 fL — ABNORMAL HIGH (ref 80.0–100.0)
Platelets: 151 10*3/uL (ref 150–400)
RBC: 4.68 MIL/uL (ref 4.22–5.81)
RDW: 12.7 % (ref 11.5–15.5)
WBC: 9.8 10*3/uL (ref 4.0–10.5)
nRBC: 0 % (ref 0.0–0.2)

## 2023-10-03 LAB — APTT: aPTT: 25 s (ref 24–36)

## 2023-10-03 LAB — COMPREHENSIVE METABOLIC PANEL WITH GFR
ALT: 44 U/L (ref 0–44)
AST: 39 U/L (ref 15–41)
Albumin: 4 g/dL (ref 3.5–5.0)
Alkaline Phosphatase: 41 U/L (ref 38–126)
Anion gap: 11 (ref 5–15)
BUN: 18 mg/dL (ref 8–23)
CO2: 18 mmol/L — ABNORMAL LOW (ref 22–32)
Calcium: 8.8 mg/dL — ABNORMAL LOW (ref 8.9–10.3)
Chloride: 105 mmol/L (ref 98–111)
Creatinine, Ser: 1.18 mg/dL (ref 0.61–1.24)
GFR, Estimated: >60 mL/min
Glucose, Bld: 136 mg/dL — ABNORMAL HIGH (ref 70–99)
Potassium: 4 mmol/L (ref 3.5–5.1)
Sodium: 134 mmol/L — ABNORMAL LOW (ref 135–145)
Total Bilirubin: 1.4 mg/dL — ABNORMAL HIGH
Total Protein: 7.2 g/dL (ref 6.5–8.1)

## 2023-10-03 LAB — LITHIUM LEVEL: Lithium Lvl: 0.74 mmol/L (ref 0.60–1.20)

## 2023-10-03 LAB — DIFFERENTIAL
Abs Immature Granulocytes: 0.06 10*3/uL (ref 0.00–0.07)
Basophils Absolute: 0 10*3/uL (ref 0.0–0.1)
Basophils Relative: 0 %
Eosinophils Absolute: 0.3 10*3/uL (ref 0.0–0.5)
Eosinophils Relative: 3 %
Immature Granulocytes: 1 %
Lymphocytes Relative: 13 %
Lymphs Abs: 1.3 10*3/uL (ref 0.7–4.0)
Monocytes Absolute: 0.7 10*3/uL (ref 0.1–1.0)
Monocytes Relative: 7 %
Neutro Abs: 7.4 10*3/uL (ref 1.7–7.7)
Neutrophils Relative %: 76 %

## 2023-10-03 LAB — CBG MONITORING, ED: Glucose-Capillary: 148 mg/dL — ABNORMAL HIGH (ref 70–99)

## 2023-10-03 LAB — ETHANOL: Alcohol, Ethyl (B): <10 mg/dL (ref <10)

## 2023-10-03 MED ORDER — IOHEXOL 350 MG/ML SOLN
75.0000 mL | Freq: Once | INTRAVENOUS | Status: AC | PRN
  Administered 2023-10-03: 75 mL via INTRAVENOUS

## 2023-10-03 MED ORDER — ONDANSETRON HCL 4 MG/2ML IJ SOLN
4.0000 mg | Freq: Once | INTRAMUSCULAR | Status: AC
  Administered 2023-10-03: 4 mg via INTRAVENOUS

## 2023-10-03 NOTE — Code Documentation (Signed)
Stroke Response Nurse Documentation Code Documentation  CLINE GRAVATT is a 62 y.o. male arriving to [facility]  via [facility] on 10/03/2023 with past medical hx of TIA, hypertension, hypercholesterolemia, periodic limb movement disorder, hypotension, dinniness, CAD s/p CABG, OSA, anxiety, biplar, chronic headaches, hyyperesthesia, depression, tremor, tinnitues of both ears, mild cognitive impairment and aphasia. On aspirin 81 mg daily and clopidogrel 75 mg daily. Code stroke was activated by EMS.   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 Patient states he woke up in the middle of the night with vomiting and woke up this morning feeling dizzy so he took meclizine as he typically does with dizziness as he has a history of Meniere's disease. While at work the dizziness returned. States it was worse while sitting down. Noted his right leg began shaking and his ride side became weak. His wife reports 2 episodes of seizure like activity.   Stroke team at the bedside on patient arrival. Labs drawn and patient cleared for CT by Dr. Earlene Plater. Patient to CT with team. NIHSS 5, see documentation for details and code stroke times. Patient with bilateral leg weakness on exam. The following imaging was completed:  CT Head and CTA. Patient is not a candidate for IV Thrombolytic due to stroke not suspected. Patient is not a candidate for IR due to no LVO.   Care Plan: Q2 NIHSS and VS; MRI.   Bedside handoff with ED RN [place].    Ferman Hamming Stroke Response RN

## 2023-10-03 NOTE — Procedures (Signed)
.  cmsweegRoutine EEG Report  Jon Gonzales is a 62 y.o. male with a history of spells who is undergoing an EEG to evaluate for seizures.  Report: This EEG was acquired with electrodes placed according to the International 10-20 electrode system (including Fp1, Fp2, F3, F4, C3, C4, P3, P4, O1, O2, T3, T4, T5, T6, A1, A2, Fz, Cz, Pz). The following electrodes were missing or displaced: none.  The occipital dominant rhythm was 9 Hz. This activity is reactive to stimulation. Drowsiness was manifested by background fragmentation; deeper stages of sleep were not identified. There was no focal slowing. There were no interictal epileptiform discharges. There were no electrographic seizures identified. There was no abnormal response to photic stimulation or hyperventilation.   Impression: This EEG was obtained while awake and drowsy and is normal.    Clinical Correlation: Normal EEGs, however, do not rule out epilepsy.  Bing Neighbors, MD [facility] [phone]  If 7pm- 7am, please page neurology on call as listed in AMION.

## 2023-10-03 NOTE — H&P (Signed)
error 

## 2023-10-03 NOTE — Consult Note (Signed)
NEUROLOGY CONSULT NOTE   Date of service: October 03, 2023 Jon Gonzales Name: Jon Gonzales MRN:  [id number] DOB:  1960/12/17 Chief Complaint: Code Stroke History is obtained from: Jon Gonzales, spouse/SO, and EMS personnel  History of Present Illness  Jon Gonzales is a 62 y.o. male  has a past medical history of Abnormal EKG (06/13/2023), Abnormal stress test small area of mild ischemia inferior wall (05/24/2022), Aphasia (06/13/2023), Arthropathy of lumbar facet joint (07/03/2023), Bilateral sacroiliitis (HCC) (08/29/2023), Bipolar 1 disorder (02/04/2023), Chronic bilateral low back pain with right-sided sciatica (06/24/2023), Chronic headaches (01/30/2021), Class 2 obesity due to excess calories without serious comorbidity in adult (09/11/2016), Confusional arousals (09/11/2016), Coronary artery disease involving coronary bypass graft of native heart with angina pectoris (05/22/2016), Degeneration of lumbar intervertebral disc (07/03/2023), Dizziness (10/27/2020), Excessive daytime sleepiness (09/11/2016), Gastroesophageal reflux disease (02/04/2023), Generalized anxiety disorder (05/22/2016), Herpes zoster (01/20/2020), History of small bowel obstruction, Hypercholesterolemia (05/22/2016), Hyperesthesia (11/29/2021), Hypertension (05/22/2016), Hypogonadism in male (05/22/2016), Hypotension (11/07/2020), Long-term current use of lithium (02/04/2023), Low testosterone, Lumbar radiculopathy (07/03/2023), Major depressive disorder (09/11/2016), Mild cognitive impairment of uncertain or unknown etiology (03/21/2023), Morton neuroma, left (09/18/2022), Nephrolithiasis, Neurological abnormality (06/13/2023), Obstructive sleep apnea (09/11/2016), 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 who presents after seizure like activity at work. 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 Jon Gonzales notes Jon Gonzales went to bed at his baseline but woke up in the middle of the night with an episode of vomiting that Jon Gonzales contributed to a bad meal earlier in the evening. Jon Gonzales woke up this morning feeling dizzy so Jon Gonzales took two meclizine currently prescribed for his Meniere's disease. Jon Gonzales went to work like normal but began feeling dizzy and sick. Jon Gonzales feels that the longer Jon Gonzales sat the dizzier Jon Gonzales got. His wife went to go sit with him in which Jon Gonzales started to shake his right leg. Jon Gonzales was then unable to lift his right leg. His grip strength was also weak in his right arm.  His wife notes that his depth perception seem to be off as Jon Gonzales was unable to accurately grab a bottle of water that she was trying to hand him.  Jon Gonzales then began having trouble reading.  His wife's coworkers who are also physicians came into the room and also began assessing him.  They then called EMS.  Jon Gonzales had 2 different episodes of transient violent shaking that appeared seizure-like.  There was no tonic-clonic activity, tongue bite or mouth frothing or incontinence noted.  The Jon Gonzales himself remembers hitting his head each episode lasted approximately 5 to 10 seconds.  .  Jon Gonzales did have 2 episodes of emesis as well as 1 episode in CT scan.  Jon Gonzales received 4 mg of Zofran.  Jon Gonzales has not received any AEDs or benzos.   LKW: 1225 Modified rankin score: 0-Completely asymptomatic and back to baseline post- stroke IV Thrombolysis: No, stroke not suspected EVT: No, no LVO   1a Level of Conscious.: 0 1b LOC Questions: 0 1c LOC Commands: 0 2 Best Gaze: 0 3 Visual: 0 4 Facial Palsy: 0 5a Motor Arm - left: 0 5b Motor Arm - Right: 0 6a Motor Leg - Left: 0 6b Motor Leg - Right: 0 7 Limb Ataxia: 0 8 Sensory: 0 9 Best Language: 0 10 Dysarthria: 0 11 Extinct. and  Inatten.: 0 TOTAL: 0    ROS  Comprehensive ROS  performed and pertinent positives documented in HPI   Past History   Past Medical History:  Diagnosis Date   Abnormal EKG 06/13/2023   Abnormal stress test small area of mild ischemia inferior wall 05/24/2022   Aphasia 06/13/2023   Arthropathy of lumbar facet joint 07/03/2023   Bilateral sacroiliitis (HCC) 08/29/2023   Bipolar 1 disorder 02/04/2023   Chronic bilateral low back pain with right-sided sciatica 06/24/2023   Chronic headaches 01/30/2021   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   Coronary artery disease involving coronary bypass graft of native heart with angina pectoris 05/22/2016   Cath 2013 LAD 95% stenosis, D1 90% stenosis, RCA 20%, Circ 30%. Previous stent to D1 2012. EF 60%.   Degeneration of lumbar intervertebral disc 07/03/2023   Dizziness 10/27/2020   Excessive daytime sleepiness 09/11/2016   Gastroesophageal reflux disease 02/04/2023   Generalized anxiety disorder 05/22/2016   Herpes zoster 01/20/2020   History of small bowel obstruction    Admitted 10-14-2007 for partical small bowel obstruction and N.G. tube was placed. Admitted by Dr Dimas Chyle. 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 Small internal hemorrhoids. Otherwise normal colonoscopy to terminal ileum   CORONARY ARTERY BYPASS GRAFT  02/19/2012   L - LAD, SVG - D1, SVG - OM   CORONARY STENT INTERVENTION N/A 11/05/2019   Procedure: CORONARY STENT INTERVENTION;  Surgeon: Runell Gess, MD;  Location: [facility];  Service: Cardiovascular;  Laterality: N/A;  SVG to DIAG   ESOPHAGOGASTRODUODENOSCOPY  01/08/2007   Multiple duodenal ulcers with stenosis/stricture of the distal first portion of the duodenum (measuring approximantely 9mm) Mild gastritis.   LEFT HEART CATH AND CORS/GRAFTS ANGIOGRAPHY N/A 11/05/2019   Procedure: LEFT HEART CATH AND CORS/GRAFTS ANGIOGRAPHY;  Surgeon: Runell Gess, MD;  Location: [facility];  Service: Cardiovascular;  Laterality: N/A;   LEFT HEART CATH AND CORS/GRAFTS ANGIOGRAPHY N/A 08/29/2023   Procedure: LEFT HEART CATH AND CORS/GRAFTS ANGIOGRAPHY;  Surgeon: Kathleene Hazel, MD;  Location: [facility];  Service: Cardiovascular;  Laterality: N/A;   RENAL ANGIOGRAPHY N/A 11/05/2019   Procedure: RENAL ANGIOGRAPHY;  Surgeon: Runell Gess, MD;  Location: [facility];  Service: Cardiovascular;  Laterality: N/A;   TONSILLECTOMY AND ADENOIDECTOMY     WRIST SURGERY      Family History: Family History  Problem Relation Age of Onset   Aneurysm Mother 7       cerebral  CAD Father 10   Heart disease Sister        Unclear   Healthy Brother    Dementia Other        several individuals on extended paternal side of family    Social History  reports that Jon Gonzales has never smoked. Jon Gonzales has never used smokeless tobacco. Jon Gonzales reports current alcohol use of about 14.0 standard drinks of alcohol per week. Jon Gonzales reports that Jon Gonzales does not use  drugs.  Allergies  Allergen Reactions   Trintellix [Vortioxetine] Other (See Comments)    Headaches.   Penicillins Rash    Did it involve swelling of the face/tongue/throat, SOB, or low BP? Unknown Did it involve sudden or severe rash/hives, skin peeling, or any reaction on the inside of your mouth or nose? Unknown Did you need to seek medical attention at a hospital or doctor's office? Unknown When did it last happen? childhood reaction      If all above answers are "NO", may proceed with cephalosporin use.     Medications   Current Facility-Administered Medications:    ondansetron (ZOFRAN) injection 4 mg, 4 mg, Intravenous, Once, Elmer Picker, NP  Current Outpatient Medications:    aspirin EC 81 MG tablet, Take 1 tablet (81 mg total) by mouth daily., Disp: 90 tablet, Rfl: 2   cariprazine (VRAYLAR) 3 MG capsule, Take 1 capsule (3 mg total) by mouth daily., Disp: 90 capsule, Rfl: 0   clopidogrel (PLAVIX) 75 MG tablet, Take 1 tablet (75 mg total) by mouth daily., Disp: 90 tablet, Rfl: 1   cyclobenzaprine (FLEXERIL) 10 MG tablet, Take 1 tablet (10 mg total) by mouth 3 (three) times daily as needed for muscle spasms., Disp: 90 tablet, Rfl: 1   DULoxetine (CYMBALTA) 60 MG capsule, Take 1 capsule (60 mg total) by mouth 2 (two) times daily., Disp: 180 capsule, Rfl: 2   EPINEPHrine (EPIPEN 2-PAK) 0.3 mg/0.3 mL IJ SOAJ injection, Inject 0.3 mg into the muscle as needed for anaphylaxis. (Jon Gonzales not taking: Reported on 09/20/2023), Disp: 1 each, Rfl: 3   ezetimibe (ZETIA) 10 MG tablet, Take 1 tablet (10 mg total) by mouth daily. (Jon Gonzales taking differently: Take 10 mg by mouth at bedtime.), Disp: 90 tablet, Rfl: 2   fexofenadine (ALLEGRA) 60 MG tablet, Take 1 tablet (60 mg total) by mouth daily. (Jon Gonzales taking differently: Take 60 mg by mouth at bedtime.), Disp: 90 tablet, Rfl: 2   hydrochlorothiazide (HYDRODIURIL) 12.5 MG tablet, Take 1 tablet (12.5 mg total) by mouth daily., Disp: 90 tablet,  Rfl: 2   isosorbide mononitrate (IMDUR) 60 MG 24 hr tablet, 2 po qd, Disp: , Rfl:    lithium carbonate (LITHOBID) 300 MG ER tablet, Take 1 tablet (300 mg total) by mouth 2 (two) times daily., Disp: 180 tablet, Rfl: 1   LORazepam (ATIVAN) 1 MG tablet, Take 1 tablet (1 mg total) by mouth every 8 (eight) hours as needed for anxiety. (Jon Gonzales taking differently: Take 1 mg by mouth See admin instructions. Take 1mg  by mouth twice a day. May take an additional dose as needed.), Disp: 90 tablet, Rfl: 1   meclizine (ANTIVERT) 25 MG tablet, Take 1 tablet (25 mg total) by mouth 3 (three) times daily as needed for dizziness., Disp: 90 tablet, Rfl: 2   metoprolol succinate (TOPROL-XL) 50 MG 24 hr tablet, Take 1 tablet by mouth daily. Take with or immediately following a meal., Disp: 90 tablet, Rfl: 2   Multiple Vitamin (MULTIVITAMIN WITH MINERALS) TABS tablet, Take 1 tablet  by mouth daily. Men's Multivitamin, Disp: , Rfl:    nitroGLYCERIN (NITROSTAT) 0.4 MG SL tablet, Dissolve 1 tablet (0.4 mg total) under the tongue every 5 (five) minutes up to 3 doses as needed for chest pain. If no relief after 3 doses call 911. (Jon Gonzales not taking: Reported on 09/20/2023), Disp: 25 tablet, Rfl: 1   ondansetron (ZOFRAN) 4 MG tablet, Take 1 tablet (4 mg total) by mouth every 8 (eight) hours as needed for nausea or vomiting., Disp: 20 tablet, Rfl: 0   pantoprazole (PROTONIX) 40 MG tablet, Take 1 tablet (40 mg total) by mouth daily., Disp: 90 tablet, Rfl: 1   ranolazine (RANEXA) 1000 MG SR tablet, Take 1 tablet by mouth 2 times daily., Disp: 180 tablet, Rfl: 2   rosuvastatin (CRESTOR) 40 MG tablet, Take 1 tablet (40 mg total) by mouth daily., Disp: 90 tablet, Rfl: 1   Semaglutide-Weight Management (WEGOVY) 2.4 MG/0.75ML SOAJ, Inject 2 mg into the skin once a week. Saturday, Disp: , Rfl:   Vitals   Vitals:   10/03/23 1300  Weight: 83.8 kg    Body mass index is 26.51 kg/m.  Physical Exam   Constitutional: Appears  well-developed and well-nourished.  Psych: Affect appropriate to situation.  Eyes: No scleral injection.  HENT: No OP obstruction.  Head: Normocephalic.  Cardiovascular: Normal rate and regular rhythm.  Respiratory: Effort normal, non-labored breathing.  GI: Soft.  No distension. There is no tenderness.  Skin: WDI.   Neurologic Examination   Neuro: Mental Status: Jon Gonzales is awake, alert, oriented to person, place, month, year, and situation. Jon Gonzales is able to give a clear and coherent history. No signs of aphasia or neglect Cranial Nerves: II: Visual Fields are full. Pupils are equal, round, and reactive to light.   III,IV, VI: EOMI without ptosis or diploplia.  V: Facial sensation is symmetric to temperature VII: Facial movement is symmetric resting and smiling VIII: Hearing is intact to voice X: Palate elevates symmetrically XI: Shoulder shrug is symmetric. XII: Tongue protrudes midline without atrophy or fasciculations.  Motor: Tone is normal. Bulk is normal. 5/5 strength was present in all four extremities.  Mild action tremor of both upper extremities right greater than left. Sensory: Sensation is symmetric to light touch and temperature in the arms and legs. No extinction to DSS present.  Cerebellar: FNF and HKS are intact bilaterally    Labs/Imaging/Neurodiagnostic studies   CBC: No results for input(s): "WBC", "NEUTROABS", "HGB", "HCT", "MCV", "PLT" in the last 168 hours.  Basic Metabolic Panel:  Lab Results  Component Value Date   NA 140 08/29/2023   K 3.9 08/29/2023   CO2 27 08/29/2023   GLUCOSE 75 08/29/2023   BUN 19 08/29/2023   CREATININE 1.00 08/29/2023   CALCIUM 8.4 (L) 08/29/2023   GFRNONAA >60 08/29/2023   GFRAA 77 11/15/2020    Lipid Panel:  Lab Results  Component Value Date   LDLCALC 38 06/12/2023    HgbA1c: No results found for: "HGBA1C"  Urine Drug Screen:     Component Value Date/Time   LABOPIA NONE DETECTED 06/12/2023 1120    COCAINSCRNUR NONE DETECTED 06/12/2023 1120   LABBENZ NONE DETECTED 06/12/2023 1120   AMPHETMU NONE DETECTED 06/12/2023 1120   THCU NONE DETECTED 06/12/2023 1120   LABBARB NONE DETECTED 06/12/2023 1120     Alcohol Level     Component Value Date/Time   ETH <10 06/12/2023 1036    INR  Lab Results  Component Value Date   INR 1.1  06/12/2023    APTT  Lab Results  Component Value Date   APTT 27 06/12/2023    Lithium level- pending    Code Stroke CT Head without contrast(Personally reviewed): No hemorrhage or CT evidence of cortical infarct. ASPECTS 10.   CT angio Head and Neck with contrast w/ perfusion (Personally reviewed): No intracranial large vessel occlusion or significant stenosis. No hemodynamically significant stenosis in the neck. 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  MRI Brain(Personally reviewed): Unremarkable appearance of the brain for age.   Neurodiagnostics rEEG:  Pending   ASSESSMENT   Jon Gonzales is a 62 y.o. male  has a past medical history of Abnormal EKG (06/13/2023), Abnormal stress test small area of mild ischemia inferior wall (05/24/2022), Aphasia (06/13/2023), Arthropathy of lumbar facet joint (07/03/2023), Bilateral sacroiliitis (HCC) (08/29/2023), Bipolar 1 disorder (02/04/2023), Chronic bilateral low back pain with right-sided sciatica (06/24/2023), Chronic headaches (01/30/2021), Class 2 obesity due to excess calories without serious comorbidity in adult (09/11/2016), Confusional arousals (09/11/2016), Coronary artery disease involving coronary bypass graft of native heart with angina pectoris (05/22/2016), Degeneration of lumbar intervertebral disc (07/03/2023), Dizziness (10/27/2020), Excessive daytime sleepiness (09/11/2016), Gastroesophageal reflux disease (02/04/2023), Generalized anxiety disorder (05/22/2016), Herpes zoster (01/20/2020), History of small bowel obstruction,  Hypercholesterolemia (05/22/2016), Hyperesthesia (11/29/2021), Hypertension (05/22/2016), Hypogonadism in male (05/22/2016), Hypotension (11/07/2020), Long-term current use of lithium (02/04/2023), Low testosterone, Lumbar radiculopathy (07/03/2023), Major depressive disorder (09/11/2016), Mild cognitive impairment of uncertain or unknown etiology (03/21/2023), Morton neuroma, left (09/18/2022), Nephrolithiasis, Neurological abnormality (06/13/2023), Obstructive sleep apnea (09/11/2016), 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   Jon Gonzales initially presented as a code stroke with stroke and seizure-like episode.  MRI is negative for stroke.  EEG is pending as well as a lithium level.  Jon Gonzales is currently a Jon Gonzales with [facility] for memory loss.  Episode does not seem consistent with seizure activity as Jon Gonzales remembers his full episodes and is able to describe his shaking.  RECOMMENDATIONS   - EEG - Lithium level - pending  - Follow up with neurology outpatient - [facility] Jon Gonzales  ______________________________________________________________  Jon Gonzales seen and examined by NP/APP with MD. MD to update note as needed.   Elmer Picker, DNP, FNP-BC [facility] Pager: [phone]  STROKE MD NOTE :  I have personally obtained history,examined this Jon Gonzales, reviewed notes, independently viewed imaging studies, participated in medical decision making and plan of care.ROS completed by me personally and pertinent positives fully documented  I have made any additions or clarifications directly to the above note. Agree with note above.  Jon Gonzales brought in as a code stroke for episode of transient right-sided weakness in the setting  of vomiting and some tonic extensor posturing following dizziness and vomiting episode.  Seems to have returned back to his baseline and CT scan, CT angiogram and MRI scan are all unyielding for acute stroke.  Clinical description of the episode also has very low suspicion for seizure.  Recommend check EEG, lithium levels.  Discussed with Jon Gonzales and his wife Dr. Sedalia Muta as well as Dr. Ninetta Lights internal medicine teaching team.  Greater than 50% time during this 55-minute visit was spent in counseling and coordination of care about strokelike episode and discussion about evaluation and treatment results and answering questions.  Delia Heady,  MD Medical Director [facility] Pager: [phone] 10/03/2023 5:30 PM

## 2023-10-03 NOTE — Discharge Instructions (Addendum)
As we discussed your MRI and EEG are normal today.  Please follow-up with [facility] and continue her current meds  Return to ER if you have worse involuntary movements, trouble speaking or weakness

## 2023-10-03 NOTE — ED Provider Notes (Signed)
  Physical Exam  BP (!) 121/94   Pulse 80   Temp 98 F (36.7 C)   Resp 19   Ht 5\' 10"  (1.778 m)   Wt 83.8 kg   SpO2 99%   BMI 26.51 kg/m   Physical Exam  Procedures  Procedures  ED Course / MDM    Medical Decision Making Care is assumed at 4 PM.  Patient is here with right arm involuntary movements.  MRI brain was unremarkable and signed out pending EEG  6:25 PM EEG showed no seizure.  I discussed case with Dr. Pearlean Brownie.  He recommend outpatient follow-up.  Patient already follows up with [facility] neurology   Amount and/or Complexity of Data Reviewed Labs: ordered. Radiology: ordered.  Risk Decision regarding hospitalization.          Charlynne Pander, MD 10/03/23 [phone]

## 2023-10-03 NOTE — ED Notes (Signed)
Pt transported to MRI via stretcher.  

## 2023-10-03 NOTE — ED Triage Notes (Signed)
Pt BIB [facility] from work as code stroke. Pt with dizziness, episode of R sided paralysis that resolved, and some possible reported witnessed tonic clonic like activity. LKW 1130.

## 2023-10-03 NOTE — Hospital Course (Addendum)
Felt dizzy eariler, has menieres, took two meclizine. Went to the office with one of the nurses. Checlked BP was 120/80. Longer he sat there the dizzier he got. Started having violent shakings, remembers hitting his head. Just kpet shaking, called ambulance. Shaking lasted about 5 minutes.   Wife - went in to sit with him. Started shaking RLE, couldn't lift R:E. Grip strength was weak in right arm. Depth perception was off (couldn't grab a bottle of water). Tongue feels thick with paresthesias with menieres, was having that before everything started. Speech got worse, couldn't read half the sentence. Got year an dmonth right, did not know the date. Did not see droop, ocular motion intact. Jerked, right arm went back, and head hit the wall violently. Happened less than 5 seconds, happened twice. Vomited as well twice. Jerked back again. Seems to be back to normalm. NO incontinence, no loss of bladder.   Middle of night he did threw up, attributed it to chili he ate.   Had a cath in November, had bypass in when he was 51 and stent since there. Underwent cath in [place].   Had similar episode in August, but wasn't weak Worsening memory issues as well.    PMH:  Bipolar Disease HTN  CAD  Depression  HLD Meniere's Disease PUD Bipolar Disease    Medications:  Aspirin 81mg   Vraylar  Plavix 75mg   Flexeril 10mg  - Lower back pain Cymbalta 60mg   Zetia 10mg   Allegra 60mg   Hydrochlorothiazide 12.5mg ] Imdur 60mg  BID  Lithium 300mg  BID Ativan 1mg  BID Meclizine 25mg  TID PRN  Toprol 50mg   Protonix 40mg   Crestor 40mg   Ranexa 1000mg  BID Wegovy 2mg   Magnesium Citrate 500mg  BID Riboflacin 400mg  BID N-Acetylcysteine 100mg  TID Coq10 200mg  TID Spiricholace   Social History:   Works at clinic  Independent in all ADLs/IADLs Lives with wife, step daughter No tobacco use, Doctor, hospital lite 5 drinks in a week   Family History:

## 2023-10-03 NOTE — ED Provider Notes (Signed)
[place] EMERGENCY DEPARTMENT AT [facility] Provider Note   CSN: [id number] Arrival date & time: 10/03/23  1323  An emergency department physician performed an initial assessment on this suspected stroke patient at 1331.  History  Chief Complaint  Patient presents with   Code Stroke    Jon Gonzales is a 62 y.o. male.  HPI 62 year old male history of hypertension, peptic ulcer disease, prior TIA, CAD, bipolar disorder presenting as code stroke.  Patient was at work today.  He had sudden onset dizziness associated with right-sided weakness.  He also initially had 3 brief jerking movements in his right arm.  Happened 3 times over 10 minutes.  He never fell to the ground.  No frank seizure-like activity, no loss of consciousness.  He has no history of seizures.  He did not fall and hit his head.  Is not any chest pain or shortness of breath or fevers or chills.  He is otherwise been at his baseline health.  Currently he is asymptomatic and feels well.  No headache.     Home Medications Prior to Admission medications   Medication Sig Start Date End Date Taking? Authorizing Provider  aspirin EC 81 MG tablet Take 1 tablet (81 mg total) by mouth daily. 08/29/23  Yes Arty Baumgartner, NP  cariprazine (VRAYLAR) 3 MG capsule Take 1 capsule (3 mg total) by mouth daily. 07/29/23  Yes Cottle, Steva Ready., MD  clopidogrel (PLAVIX) 75 MG tablet Take 1 tablet (75 mg total) by mouth daily. 08/29/23  Yes [name], PA-C  cyclobenzaprine (FLEXERIL) 10 MG tablet Take 1 tablet (10 mg total) by mouth 3 (three) times daily as needed for muscle spasms. 08/29/23  Yes [name], PA-C  DULoxetine (CYMBALTA) 60 MG capsule Take 1 capsule (60 mg total) by mouth 2 (two) times daily. 07/29/23  Yes Cottle, Steva Ready., MD  EPINEPHrine (EPIPEN 2-PAK) 0.3 mg/0.3 mL IJ SOAJ injection Inject 0.3 mg into the muscle as needed for anaphylaxis. 09/22/22  Yes [name], MD  ezetimibe (ZETIA) 10  MG tablet Take 1 tablet (10 mg total) by mouth daily. Patient taking differently: Take 10 mg by mouth at bedtime. 10/03/22  Yes [name], MD  fexofenadine (ALLEGRA) 60 MG tablet Take 1 tablet (60 mg total) by mouth daily. Patient taking differently: Take 60 mg by mouth at bedtime. 10/03/22  Yes [name], MD  hydrochlorothiazide (HYDRODIURIL) 12.5 MG tablet Take 1 tablet (12.5 mg total) by mouth daily. 09/30/23  Yes [name], PA-C  isosorbide mononitrate (IMDUR) 60 MG 24 hr tablet 2 po qd Patient taking differently: Take 120 mg by mouth daily. 09/04/23  Yes [name], PA-C  lithium carbonate (LITHOBID) 300 MG ER tablet Take 1 tablet (300 mg total) by mouth 2 (two) times daily. 09/23/23  Yes [name], PA-C  LORazepam (ATIVAN) 1 MG tablet Take 1 tablet (1 mg total) by mouth every 8 (eight) hours as needed for anxiety. Patient taking differently: Take 1 mg by mouth See admin instructions. Take 1mg  by mouth twice a day. May take an additional dose as needed. 07/29/23  Yes Cottle, Steva Ready., MD  meclizine (ANTIVERT) 25 MG tablet Take 1 tablet (25 mg total) by mouth 3 (three) times daily as needed for dizziness. 10/03/22  Yes [name], MD  metoprolol succinate (TOPROL-XL) 50 MG 24 hr tablet Take 1 tablet by mouth daily. Take with or immediately following a meal. 10/03/22  Yes Marina Goodell,  Mickle Mallory, MD  Multiple Vitamin (MULTIVITAMIN WITH MINERALS) TABS tablet Take 1 tablet by mouth daily. Men's Multivitamin   Yes [provider]  nitroGLYCERIN (NITROSTAT) 0.4 MG SL tablet Dissolve 1 tablet (0.4 mg total) under the tongue every 5 (five) minutes up to 3 doses as needed for chest pain. If no relief after 3 doses call 911. 09/04/23  Yes [name], PA-C  ondansetron (ZOFRAN) 4 MG tablet Take 1 tablet (4 mg total) by mouth every 8 (eight) hours as needed for nausea or vomiting. 10/13/22  Yes Janie Morning, NP  pantoprazole (PROTONIX) 40 MG tablet  Take 1 tablet (40 mg total) by mouth daily. 08/29/23  Yes [name], PA-C  ranolazine (RANEXA) 1000 MG SR tablet Take 1 tablet by mouth 2 times daily. 08/29/23  Yes Laverda Page B, NP  rosuvastatin (CRESTOR) 40 MG tablet Take 1 tablet (40 mg total) by mouth daily. 08/29/23  Yes [name], PA-C  Semaglutide-Weight Management (WEGOVY) 2.4 MG/0.75ML SOAJ Inject 2 mg into the skin once a week. Saturday   Yes [provider]  diltiazem (CARDIZEM SR) 60 MG 12 hr capsule Take 1 capsule (60 mg total) by mouth 2 (two) times daily. 05/02/20 11/07/20  [name], MD  pantoprazole (PROTONIX) 40 MG tablet Take 1 tablet (40 mg total) by mouth daily. 01/17/22   Janie Morning, NP  triamterene-hydrochlorothiazide (DYAZIDE) 37.5-25 MG capsule Take 1 each (1 capsule total) by mouth daily. 08/08/20 10/25/20  [name], MD      Allergies    Trintellix [vortioxetine] and Penicillins    Review of Systems   Review of Systems Review of systems completed and notable as per HPI.  ROS otherwise negative.   Physical Exam Updated Vital Signs BP (!) 134/93   Pulse 73   Temp 98.4 F (36.9 C) (Oral)   Resp 17   Ht 5\' 10"  (1.778 m)   Wt 83.8 kg   SpO2 100%   BMI 26.51 kg/m  Physical Exam Vitals and nursing note reviewed.  Constitutional:      General: He is not in acute distress.    Appearance: He is well-developed.  HENT:     Head: Normocephalic and atraumatic.     Nose: Nose normal.     Mouth/Throat:     Mouth: Mucous membranes are moist.     Pharynx: Oropharynx is clear.  Eyes:     Extraocular Movements: Extraocular movements intact.     Conjunctiva/sclera: Conjunctivae normal.     Pupils: Pupils are equal, round, and reactive to light.  Cardiovascular:     Rate and Rhythm: Normal rate and regular rhythm.     Pulses: Normal pulses.     Heart sounds: Normal heart sounds. No murmur heard. Pulmonary:     Effort: Pulmonary effort is normal. No respiratory  distress.     Breath sounds: Normal breath sounds.  Abdominal:     Palpations: Abdomen is soft.     Tenderness: There is no abdominal tenderness. There is no guarding or rebound.  Musculoskeletal:        General: No swelling.     Cervical back: Normal range of motion and neck supple. No rigidity or tenderness.     Right lower leg: No edema.     Left lower leg: No edema.  Skin:    General: Skin is warm and dry.     Capillary Refill: Capillary refill takes less than 2 seconds.  Neurological:  General: No focal deficit present.     Mental Status: He is alert and oriented to person, place, and time. Mental status is at baseline.     Comments: Patient awake alert.  Normal speech.  Cranial nerves are intact.  No visual field cuts.  Normal finger-to-nose bilaterally.  Normal strength in the bilateral upper and lower extremities as well as intact sensation.  No neglect.  Psychiatric:        Mood and Affect: Mood normal.     ED Results / Procedures / Treatments   Labs (all labs ordered are listed, but only abnormal results are displayed) Labs Reviewed  CBC - Abnormal; Notable for the following components:      Result Value   MCV 100.2 (*)    MCH 34.2 (*)    All other components within normal limits  COMPREHENSIVE METABOLIC PANEL - Abnormal; Notable for the following components:   Sodium 134 (*)    CO2 18 (*)    Glucose, Bld 136 (*)    Calcium 8.8 (*)    Total Bilirubin 1.4 (*)    All other components within normal limits  I-STAT CHEM 8, ED - Abnormal; Notable for the following components:   Glucose, Bld 143 (*)    Calcium, Ion 1.02 (*)    TCO2 18 (*)    All other components within normal limits  CBG MONITORING, ED - Abnormal; Notable for the following components:   Glucose-Capillary 148 (*)    All other components within normal limits  ETHANOL  PROTIME-INR  APTT  DIFFERENTIAL  LITHIUM LEVEL  RAPID URINE DRUG SCREEN, HOSP PERFORMED  URINALYSIS, ROUTINE W REFLEX  MICROSCOPIC  TROPONIN I (HIGH SENSITIVITY)  TROPONIN I (HIGH SENSITIVITY)    EKG EKG Interpretation Date/Time:  Thursday October 03 2023 13:58:52 EST Ventricular Rate:  75 PR Interval:  177 QRS Duration:  118 QT Interval:  457 QTC Calculation: 511 R Axis:   -19  Text Interpretation: Sinus rhythm Prominent P waves, nondiagnostic Incomplete right bundle branch block Confirmed by Fulton Reek [phone]) on 10/03/2023 2:06:00 PM  Radiology MR BRAIN WO CONTRAST Result Date: 10/03/2023 CLINICAL DATA:  Stroke, follow up. Dizziness, episode of right-sided weakness which resolved, and possible seizure-like activity. EXAM: MRI HEAD WITHOUT CONTRAST TECHNIQUE: Multiplanar, multiecho pulse sequences of the brain and surrounding structures were obtained without intravenous contrast. COMPARISON:  Head CT and CTA 10/03/2023 and MRI 06/12/2023 FINDINGS: Brain: There is no evidence of an acute infarct, intracranial hemorrhage, mass, midline shift, or extra-axial fluid collection. A punctate focus of mild trace diffusion hyperintensity on axial images in the right parietal subcortical white matter is without corresponding reduced ADC and is attributed to T2 shine through. A few punctate foci of T2 hyperintensity in the cerebral white matter are nonspecific and considered to be within normal limits for age. Cerebral volume is normal for age with normal size of the ventricles. Vascular: Major intracranial vascular flow voids are preserved. Skull and upper cervical spine: Unremarkable bone marrow signal. Sinuses/Orbits: Unremarkable orbits. Minimal mucosal thickening in the maxillary sinuses. Clear mastoid air cells. Other: None. IMPRESSION: Unremarkable appearance of the brain for age. Electronically Signed   By: Sebastian Ache M.D.   On: 10/03/2023 15:48   CT ANGIO HEAD NECK W WO CM (CODE STROKE) Result Date: 10/03/2023 CLINICAL DATA:  Neuro deficit, acute, stroke suspected EXAM: CT ANGIOGRAPHY HEAD AND NECK  WITH AND WITHOUT CONTRAST TECHNIQUE: Multidetector CT imaging of the head and neck was performed using the standard  protocol during bolus administration of intravenous contrast. Multiplanar CT image reconstructions and MIPs were obtained to evaluate the vascular anatomy. Carotid stenosis measurements (when applicable) are obtained utilizing NASCET criteria, using the distal internal carotid diameter as the denominator. RADIATION DOSE REDUCTION: This exam was performed according to the departmental dose-optimization program which includes automated exposure control, adjustment of the mA and/or kV according to patient size and/or use of iterative reconstruction technique. CONTRAST:  75mL OMNIPAQUE IOHEXOL 350 MG/ML SOLN COMPARISON:  CTA Neck 08/10/23 FINDINGS: CT HEAD FINDINGS See same day CT head for intracranial findings CTA NECK FINDINGS Aortic arch: Standard branching. Imaged portion shows no evidence of aneurysm or dissection. No significant stenosis of the major arch vessel origins. Right carotid system: No evidence of dissection, stenosis (50% or greater), or occlusion. Left carotid system: No evidence of dissection, stenosis (50% or greater), or occlusion. 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 No evidence of dissection, stenosis (50% or greater), or occlusion. Skeleton: Negative. Other neck: Negative. Upper chest: Negative. Review of the MIP images confirms the above findings CTA HEAD FINDINGS Anterior circulation: No significant stenosis, proximal occlusion, aneurysm, or vascular malformation. Posterior circulation: No significant stenosis, proximal occlusion, aneurysm, or vascular malformation. Venous sinuses: As permitted by contrast timing, patent. Anatomic variants: None Review of the MIP images confirms the above findings IMPRESSION: 1. No intracranial large vessel occlusion or significant stenosis. 2. No  hemodynamically significant stenosis in the neck. 3. 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 Electronically Signed   By: Lorenza Cambridge M.D.   On: 10/03/2023 14:05   CT HEAD CODE STROKE WO CONTRAST Result Date: 10/03/2023 CLINICAL DATA:  Code stroke.  Right-sided weakness EXAM: CT HEAD WITHOUT CONTRAST TECHNIQUE: Contiguous axial images were obtained from the base of the skull through the vertex without intravenous contrast. RADIATION DOSE REDUCTION: This exam was performed according to the departmental dose-optimization program which includes automated exposure control, adjustment of the mA and/or kV according to patient size and/or use of iterative reconstruction technique. COMPARISON:  None Available. FINDINGS: Brain: Negative for an acute infarct. No hemorrhage. No hydrocephalus. No extra-axial fluid collection. No CT evidence of cortical infarct. No mass effect. No mass lesion. Vascular: Diffusely hyperdense appearance of the intracranial vasculature Skull: Normal. Negative for fracture or focal lesion. Sinuses/Orbits: Middle ear or mastoid effusion. Mucosal thickening bilateral maxillary sinuses. Orbits are unremarkable. Other: None. ASPECTS [facility] Stroke Program Early CT Score): 10 IMPRESSION: No hemorrhage or CT evidence of cortical infarct.  ASPECTS 10. Electronically Signed   By: Lorenza Cambridge M.D.   On: 10/03/2023 13:45    Procedures Procedures    Medications Ordered in ED Medications  ondansetron (ZOFRAN) injection 4 mg (4 mg Intravenous Given 10/03/23 1331)  iohexol (OMNIPAQUE) 350 MG/ML injection 75 mL (75 mLs Intravenous Contrast Given 10/03/23 1347)    ED Course/ Medical Decision Making/ A&P                                 Medical Decision Making Amount and/or Complexity of Data Reviewed Labs: ordered. Radiology: ordered.  Risk Decision regarding hospitalization.   Medical Decision Making:   MARSHAWN CLINE is a  62 y.o. male who presented to the ED today with episode of abnormal movement and right-sided weakness.  Initial evaluation code stroke with neurology at bedside.  CT scan and CTA did not show any acute findings.  He did have some abnormal movements, could be abnormal focal seizure, no syncope.  He never any chest pain or signs or symptoms of ACS.  His lab work here is overall reassuring other than slightly low bicarb and slightly elevated bilirubin but benign abdominal exam.  Neurology recommended MRI, which did not have any acute findings.  Neurology initially recommended medicine admission for MRI, EEG.  However they reevaluated patient and discussed with the patient and the medicine team at bedside.  Both neurology, medicine, and the patient and his wife are comfortable with discharging home if EEG is normal and he is feeling well.  EEG is pending.  Handoff was given to Dr. Silverio Lay with plan to follow-up EEG and reassess.   Patient placed on continuous vitals and telemetry monitoring while in ED which was reviewed periodically.  Reviewed and confirmed nursing documentation for past medical history, family history, social history.   Patient's presentation is most consistent with acute complicated illness / injury requiring diagnostic workup.           Final Clinical Impression(s) / ED Diagnoses Final diagnoses:  None    Rx / DC Orders ED Discharge Orders     None         Laurence Spates, MD 10/03/23 1742

## 2023-10-03 NOTE — Consult Note (Signed)
Date: 10/03/2023               Patient Name:  Jon Gonzales MRN: [id number]  DOB: November 22, 1960 Age / Sex: 62 y.o., male   PCP: [name], PA-C         Requesting Physician: Dr. Silverio Lay Gonzella Lex, MD    Consulting Reason:  Stroke and seizure workup      Chief Complaint: Weakness and tonic-clonic movements  History of Present Illness: Jon Gonzales is a 62 YO M with a PMH of bipolar disease, HTN, CAD, depression, HLD, Meniere's Disease, PUD and bipolar Disease who presented to the ED for stroke workup. Patient notes he went to bed at his baseline but woke up in the middle of the night with an episode of vomiting that he contributed to a bad meal earlier in the evening. He woke up this morning feeling dizzy so he took two meclizine currently prescribed for his Meniere's disease. He went into his office this morning where one of his nurses check his BP that was normal at 120/80. He feels that the longer he sat the dizzier he got.    His wife went to go sit with him in which he started to shake his RLE until he could not lift his RLE. His grip strength was also weak in his right arm, as well as his depth perception as he could not grab a bottle of water. Hi speech started to worsen in which he could not read half of a sentence. He started to have two of violent shaking, and he remembers hitting his head. Each shaking episode lasted roughly five seconds according to his wife. He denied any tongue lacerations or incontinence. He vomited twice. At presentation, he is at his baseline. He denies any sick contacts or recent illnesses. Of note, patient did have a cath in November. He also had a similar episode in August, but it did not present with weakness.   Meds: No current facility-administered medications for this encounter.   Current Outpatient Medications  Medication Sig Dispense Refill   aspirin EC 81 MG tablet Take 1 tablet (81 mg total) by mouth daily. 90 tablet 2   cariprazine (VRAYLAR) 3 MG  capsule Take 1 capsule (3 mg total) by mouth daily. 90 capsule 0   clopidogrel (PLAVIX) 75 MG tablet Take 1 tablet (75 mg total) by mouth daily. 90 tablet 1   cyclobenzaprine (FLEXERIL) 10 MG tablet Take 1 tablet (10 mg total) by mouth 3 (three) times daily as needed for muscle spasms. 90 tablet 1   DULoxetine (CYMBALTA) 60 MG capsule Take 1 capsule (60 mg total) by mouth 2 (two) times daily. 180 capsule 2   EPINEPHrine (EPIPEN 2-PAK) 0.3 mg/0.3 mL IJ SOAJ injection Inject 0.3 mg into the muscle as needed for anaphylaxis. 1 each 3   ezetimibe (ZETIA) 10 MG tablet Take 1 tablet (10 mg total) by mouth daily. (Patient taking differently: Take 10 mg by mouth at bedtime.) 90 tablet 2   fexofenadine (ALLEGRA) 60 MG tablet Take 1 tablet (60 mg total) by mouth daily. (Patient taking differently: Take 60 mg by mouth at bedtime.) 90 tablet 2   hydrochlorothiazide (HYDRODIURIL) 12.5 MG tablet Take 1 tablet (12.5 mg total) by mouth daily. 90 tablet 2   isosorbide mononitrate (IMDUR) 60 MG 24 hr tablet 2 po qd (Patient taking differently: Take 120 mg by mouth daily.)     lithium carbonate (LITHOBID) 300 MG ER tablet Take 1 tablet (300 mg  total) by mouth 2 (two) times daily. 180 tablet 1   LORazepam (ATIVAN) 1 MG tablet Take 1 tablet (1 mg total) by mouth every 8 (eight) hours as needed for anxiety. (Patient taking differently: Take 1 mg by mouth See admin instructions. Take 1mg  by mouth twice a day. May take an additional dose as needed.) 90 tablet 1   meclizine (ANTIVERT) 25 MG tablet Take 1 tablet (25 mg total) by mouth 3 (three) times daily as needed for dizziness. 90 tablet 2   metoprolol succinate (TOPROL-XL) 50 MG 24 hr tablet Take 1 tablet by mouth daily. Take with or immediately following a meal. 90 tablet 2   Multiple Vitamin (MULTIVITAMIN WITH MINERALS) TABS tablet Take 1 tablet by mouth daily. Men's Multivitamin     nitroGLYCERIN (NITROSTAT) 0.4 MG SL tablet Dissolve 1 tablet (0.4 mg total) under the  tongue every 5 (five) minutes up to 3 doses as needed for chest pain. If no relief after 3 doses call 911. 25 tablet 1   ondansetron (ZOFRAN) 4 MG tablet Take 1 tablet (4 mg total) by mouth every 8 (eight) hours as needed for nausea or vomiting. 20 tablet 0   pantoprazole (PROTONIX) 40 MG tablet Take 1 tablet (40 mg total) by mouth daily. 90 tablet 1   ranolazine (RANEXA) 1000 MG SR tablet Take 1 tablet by mouth 2 times daily. 180 tablet 2   rosuvastatin (CRESTOR) 40 MG tablet Take 1 tablet (40 mg total) by mouth daily. 90 tablet 1   Semaglutide-Weight Management (WEGOVY) 2.4 MG/0.75ML SOAJ Inject 2 mg into the skin once a week. Saturday      Allergies: Allergies as of 10/03/2023 - Review Complete 10/03/2023  Allergen Reaction Noted   Trintellix [vortioxetine] Other (See Comments) 10/30/2019   Penicillins Rash 11/09/2014   Past Medical History:  Diagnosis Date   Abnormal EKG 06/13/2023   Abnormal stress test small area of mild ischemia inferior wall 05/24/2022   Aphasia 06/13/2023   Arthropathy of lumbar facet joint 07/03/2023   Bilateral sacroiliitis (HCC) 08/29/2023   Bipolar 1 disorder 02/04/2023   Chronic bilateral low back pain with right-sided sciatica 06/24/2023   Chronic headaches 01/30/2021   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   Coronary artery disease involving coronary bypass graft of native heart with angina pectoris 05/22/2016   Cath 2013 LAD 95% stenosis, D1 90% stenosis, RCA 20%, Circ 30%. Previous stent to D1 2012. EF 60%.   Degeneration of lumbar intervertebral disc 07/03/2023   Dizziness 10/27/2020   Excessive daytime sleepiness 09/11/2016   Gastroesophageal reflux disease 02/04/2023   Generalized anxiety disorder 05/22/2016   Herpes zoster 01/20/2020   History of small bowel obstruction    Admitted 10-14-2007 for partical small bowel obstruction and N.G. tube was placed.  Admitted by Dr Dimas Chyle. 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 Small internal hemorrhoids. Otherwise normal colonoscopy to terminal ileum   CORONARY ARTERY BYPASS GRAFT  02/19/2012   L - LAD, SVG - D1, SVG - OM   CORONARY STENT INTERVENTION N/A 11/05/2019   Procedure: CORONARY STENT INTERVENTION;  Surgeon: Runell Gess, MD;  Location: [facility];  Service: Cardiovascular;  Laterality: N/A;  SVG to DIAG   ESOPHAGOGASTRODUODENOSCOPY  01/08/2007   Multiple duodenal ulcers with stenosis/stricture of the distal first portion of the duodenum (measuring approximantely 9mm) Mild gastritis.   LEFT HEART CATH AND CORS/GRAFTS ANGIOGRAPHY N/A 11/05/2019   Procedure: LEFT HEART CATH AND CORS/GRAFTS ANGIOGRAPHY;  Surgeon: Runell Gess, MD;  Location: [facility];  Service: Cardiovascular;  Laterality: N/A;   LEFT HEART CATH AND CORS/GRAFTS ANGIOGRAPHY N/A 08/29/2023   Procedure: LEFT HEART CATH AND CORS/GRAFTS ANGIOGRAPHY;  Surgeon: Kathleene Hazel, MD;  Location:  [facility];  Service: Cardiovascular;  Laterality: N/A;   RENAL ANGIOGRAPHY N/A 11/05/2019   Procedure: RENAL ANGIOGRAPHY;  Surgeon: Runell Gess, MD;  Location: [facility];  Service: Cardiovascular;  Laterality: N/A;   TONSILLECTOMY AND ADENOIDECTOMY     WRIST SURGERY     Family History  Problem Relation Age of Onset   Aneurysm Mother 68       cerebral   CAD Father 75   Heart disease Sister        Unclear   Healthy Brother    Dementia Other        several individuals on extended paternal side of family   Social History   Socioeconomic History   Marital status: Married    Spouse name: Not on file   Number of children: Not on file   Years of education: 16   Highest education level: Bachelor's degree (e.g., BA, AB, BS)  Occupational History   Occupation: Oncologist: [employer]  Tobacco Use   Smoking status: Never   Smokeless tobacco: Never  Vaping Use   Vaping status: Never Used  Substance and Sexual Activity   Alcohol use: Yes    Alcohol/week: 14.0 standard drinks of alcohol    Types: 14 Cans of beer per week    Comment: ~ 2 beers nightly   Drug use: Never   Sexual activity: Not on file  Other Topics Concern   Not on file  Social History Narrative   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        Right Handed   Lives in two story home    Lives with wife   2 step children'   Social Drivers of Health   Financial Resource Strain: Low Risk  (06/04/2022)   Overall Financial Resource Strain (CARDIA)    Difficulty of Paying Living Expenses: Not hard at all  Food Insecurity: No Food Insecurity (06/04/2022)   Hunger Vital Sign    Worried About Running Out of Food in the Last Year: Never true    Ran Out of Food in the Last Year: Never true  Transportation Needs: No Transportation Needs (06/04/2022)   PRAPARE - Transportation  Lack of Transportation (Medical): No    Lack of  Transportation (Non-Medical): No  Physical Activity: Inactive (06/04/2022)   Exercise Vital Sign    Days of Exercise per Week: 0 days    Minutes of Exercise per Session: 0 min  Stress: No Stress Concern Present (06/04/2022)   [employer] of Occupational Health - Occupational Stress Questionnaire    Feeling of Stress : Not at all  Social Connections: Moderately Integrated (06/04/2022)   Social Connection and Isolation Panel [NHANES]    Frequency of Communication with Friends and Family: More than three times a week    Frequency of Social Gatherings with Friends and Family: More than three times a week    Attends Religious Services: Never    Database administrator or Organizations: Yes    Attends Engineer, structural: More than 4 times per year    Marital Status: Married  Catering manager Violence: Not At Risk (06/04/2022)   Humiliation, Afraid, Rape, and Kick questionnaire    Fear of Current or Ex-Partner: No    Emotionally Abused: No    Physically Abused: No    Sexually Abused: No    Review of Systems: Pertinent items are noted in HPI.  Physical Exam: Blood pressure (!) 121/94, pulse 80, temperature 98 F (36.7 C), resp. rate 19, height 5\' 10"  (1.778 m), weight 83.8 kg, SpO2 99%. BP (!) 121/94   Pulse 80   Temp 98 F (36.7 C)   Resp 19   Ht 5\' 10"  (1.778 m)   Wt 83.8 kg   SpO2 99%   BMI 26.51 kg/m   General Appearance:    Alert, cooperative, no distress, appears stated age  Head:    Normocephalic, without obvious abnormality, atraumatic  Eyes:    PERRL, conjunctiva/corneas clear, EOM's intact, fundi    benign, both eyes       Ears:    Normal TM's and external ear canals, both ears  Nose:   Nares normal, septum midline, mucosa normal, no drainage    or sinus tenderness  Throat:   Lips, mucosa, and tongue normal; teeth and gums normal  Neck:   Supple, symmetrical, trachea midline, no adenopathy;       thyroid:  No enlargement/tenderness/nodules; no carotid    bruit or JVD  Back:     Symmetric, no curvature, ROM normal, no CVA tenderness  Lungs:     Clear to auscultation bilaterally, respirations unlabored  Chest wall:    No tenderness or deformity  Heart:    Regular rate and rhythm, S1 and S2 normal, no murmur, rub   or gallop  Abdomen:     Soft, non-tender, bowel sounds active all four quadrants,    no masses, no organomegaly  Genitalia:    Normal male without lesion, discharge or tenderness  Rectal:    Normal tone, normal prostate, no masses or tenderness;   guaiac negative stool  Extremities:   Extremities normal, atraumatic, no cyanosis or edema  Pulses:   2+ and symmetric all extremities  Skin:   Skin color, texture, turgor normal, no rashes or lesions  Lymph nodes:   Cervical, supraclavicular, and axillary nodes normal  Neurologic:   CNII-XII intact. Normal strength, sensation and reflexes      throughout    Lab results: CBC    Component Value Date/Time   WBC 9.8 10/03/2023 1326   RBC 4.68 10/03/2023 1326   HGB 15.6 10/03/2023 1335   HGB 13.8 06/13/2023 1349  HCT 46.0 10/03/2023 1335   HCT 40.8 06/13/2023 1349   PLT 151 10/03/2023 1326   PLT 139 (L) 06/13/2023 1349   MCV 100.2 (H) 10/03/2023 1326   MCV 101 (H) 06/13/2023 1349   MCH 34.2 (H) 10/03/2023 1326   MCHC 34.1 10/03/2023 1326   RDW 12.7 10/03/2023 1326   RDW 13.4 06/13/2023 1349   LYMPHSABS 1.3 10/03/2023 1326   LYMPHSABS 1.4 06/13/2023 1349   MONOABS 0.7 10/03/2023 1326   EOSABS 0.3 10/03/2023 1326   EOSABS 0.1 06/13/2023 1349   BASOSABS 0.0 10/03/2023 1326   BASOSABS 0.0 06/13/2023 1349     Imaging results:  EEG adult Result Date: 10/03/2023 Jefferson Fuel, MD     10/03/2023  6:30 PM .cmsweegRoutine EEG Report Jon Gonzales is a 62 y.o. male with a history of spells who is undergoing an EEG to evaluate for seizures. Report: This EEG was acquired with electrodes placed according to the International 10-20 electrode system (including Fp1, Fp2, F3, F4, C3,  C4, P3, P4, O1, O2, T3, T4, T5, T6, A1, A2, Fz, Cz, Pz). The following electrodes were missing or displaced: none. The occipital dominant rhythm was 9 Hz. This activity is reactive to stimulation. Drowsiness was manifested by background fragmentation; deeper stages of sleep were not identified. There was no focal slowing. There were no interictal epileptiform discharges. There were no electrographic seizures identified. There was no abnormal response to photic stimulation or hyperventilation. Impression: This EEG was obtained while awake and drowsy and is normal.   Clinical Correlation: Normal EEGs, however, do not rule out epilepsy. Bing Neighbors, MD [facility] [phone] If 7pm- 7am, please page neurology on call as listed in AMION.   MR BRAIN WO CONTRAST Result Date: 10/03/2023 CLINICAL DATA:  Stroke, follow up. Dizziness, episode of right-sided weakness which resolved, and possible seizure-like activity. EXAM: MRI HEAD WITHOUT CONTRAST TECHNIQUE: Multiplanar, multiecho pulse sequences of the brain and surrounding structures were obtained without intravenous contrast. COMPARISON:  Head CT and CTA 10/03/2023 and MRI 06/12/2023 FINDINGS: Brain: There is no evidence of an acute infarct, intracranial hemorrhage, mass, midline shift, or extra-axial fluid collection. A punctate focus of mild trace diffusion hyperintensity on axial images in the right parietal subcortical white matter is without corresponding reduced ADC and is attributed to T2 shine through. A few punctate foci of T2 hyperintensity in the cerebral white matter are nonspecific and considered to be within normal limits for age. Cerebral volume is normal for age with normal size of the ventricles. Vascular: Major intracranial vascular flow voids are preserved. Skull and upper cervical spine: Unremarkable bone marrow signal. Sinuses/Orbits: Unremarkable orbits. Minimal mucosal thickening in the maxillary sinuses. Clear mastoid air cells.  Other: None. IMPRESSION: Unremarkable appearance of the brain for age. Electronically Signed   By: Sebastian Ache M.D.   On: 10/03/2023 15:48   CT ANGIO HEAD NECK W WO CM (CODE STROKE) Result Date: 10/03/2023 CLINICAL DATA:  Neuro deficit, acute, stroke suspected EXAM: CT ANGIOGRAPHY HEAD AND NECK WITH AND WITHOUT CONTRAST TECHNIQUE: Multidetector CT imaging of the head and neck was performed using the standard protocol during bolus administration of intravenous contrast. Multiplanar CT image reconstructions and MIPs were obtained to evaluate the vascular anatomy. Carotid stenosis measurements (when applicable) are obtained utilizing NASCET criteria, using the distal internal carotid diameter as the denominator. RADIATION DOSE REDUCTION: This exam was performed according to the departmental dose-optimization program which includes automated exposure control, adjustment of the mA and/or kV according to  patient size and/or use of iterative reconstruction technique. CONTRAST:  75mL OMNIPAQUE IOHEXOL 350 MG/ML SOLN COMPARISON:  CTA Neck 08/10/23 FINDINGS: CT HEAD FINDINGS See same day CT head for intracranial findings CTA NECK FINDINGS Aortic arch: Standard branching. Imaged portion shows no evidence of aneurysm or dissection. No significant stenosis of the major arch vessel origins. Right carotid system: No evidence of dissection, stenosis (50% or greater), or occlusion. Left carotid system: No evidence of dissection, stenosis (50% or greater), or occlusion. 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 No evidence of dissection, stenosis (50% or greater), or occlusion. Skeleton: Negative. Other neck: Negative. Upper chest: Negative. Review of the MIP images confirms the above findings CTA HEAD FINDINGS Anterior circulation: No significant stenosis, proximal occlusion, aneurysm, or vascular malformation. Posterior circulation: No  significant stenosis, proximal occlusion, aneurysm, or vascular malformation. Venous sinuses: As permitted by contrast timing, patent. Anatomic variants: None Review of the MIP images confirms the above findings IMPRESSION: 1. No intracranial large vessel occlusion or significant stenosis. 2. No hemodynamically significant stenosis in the neck. 3. 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 Electronically Signed   By: Lorenza Cambridge M.D.   On: 10/03/2023 14:05   CT HEAD CODE STROKE WO CONTRAST Result Date: 10/03/2023 CLINICAL DATA:  Code stroke.  Right-sided weakness EXAM: CT HEAD WITHOUT CONTRAST TECHNIQUE: Contiguous axial images were obtained from the base of the skull through the vertex without intravenous contrast. RADIATION DOSE REDUCTION: This exam was performed according to the departmental dose-optimization program which includes automated exposure control, adjustment of the mA and/or kV according to patient size and/or use of iterative reconstruction technique. COMPARISON:  None Available. FINDINGS: Brain: Negative for an acute infarct. No hemorrhage. No hydrocephalus. No extra-axial fluid collection. No CT evidence of cortical infarct. No mass effect. No mass lesion. Vascular: Diffusely hyperdense appearance of the intracranial vasculature Skull: Normal. Negative for fracture or focal lesion. Sinuses/Orbits: Middle ear or mastoid effusion. Mucosal thickening bilateral maxillary sinuses. Orbits are unremarkable. Other: None. ASPECTS [facility] Stroke Program Early CT Score): 10 IMPRESSION: No hemorrhage or CT evidence of cortical infarct.  ASPECTS 10. Electronically Signed   By: Lorenza Cambridge M.D.   On: 10/03/2023 13:45    Other results: EKG: normal EKG, normal sinus rhythm, unchanged from previous tracings, normal sinus rhythm.  Assessment, Plan, & Recommendations by Problem:  #Weakness #Tonic-clonic movements  Patient consulted by  neurology. Stroke workup negative. MRI brain demonstrated unremarkable appearance of the brain for age. CTA head and neck demonstrated no intracranial large vessel occlusion or significant stenosis. CBC, diff, and CMP largely unremarkable. Patient has had recent changes to his medication regimen as he recently saw neurology for his headaches but unlikely the cause for his current presentation. Workup has remained negative so far and etiology remains unknown. EEG negative. Lithium levels WNL. Discussed with Dr. Pearlean Brownie from neurology who feels patient is safe for discharge.   Signed: Morrie Sheldon, MD 10/03/2023, 6:39 PM

## 2023-10-03 NOTE — Progress Notes (Signed)
EEG complete - results pending 

## 2023-10-04 ENCOUNTER — Telehealth: Payer: Self-pay

## 2023-10-04 ENCOUNTER — Other Ambulatory Visit: Payer: Self-pay | Admitting: Physician Assistant

## 2023-10-04 ENCOUNTER — Encounter: Payer: Self-pay | Admitting: Physician Assistant

## 2023-10-04 DIAGNOSIS — R413 Other amnesia: Secondary | ICD-10-CM

## 2023-10-04 DIAGNOSIS — R569 Unspecified convulsions: Secondary | ICD-10-CM

## 2023-10-04 NOTE — Progress Notes (Unsigned)
Patient's wife contacted me via secure chat, regarding the patient's condition upon being seen in the Emergency Department for uncontrolled arm movement, apparently a EEG was performed which was negative.  They declined neurological evaluation and workup while there. Communication was as follows:  "Good morning, Good Morning, I am not sure the best way to reach out to you. I do not have access to my husband's my chart Corrinne Eagle Geise, dob: Jan 29, 1961.) He was taken by EMS to [facility] yesterday as a code stroke. Huntley Dec and I had discussed getting an EEG on him at his recent appointment. He had a 20 minute EEG in the ED, but the more I think about it these episodes he is having particularly yesterday are very seizure like. They offered to keep him overnight for a 24 hour EEG and I wish we had done this now. Is there any way we could get this? Also I wanted to set up a follow up, but I was not sure who to schedule it with. I also left a message about his Lumbar Puncture that was supposed to be scheduled and have not heard anything. Thank you for your help. Dr. Sedalia Muta " We responded that in order for her to communicate with [place] she needs to do it via front desk and not through secure chat, which is a HIPAA violation, as we follow office policies.  Patient's wife's report responded with an "thumbs up emoji".

## 2023-10-04 NOTE — Telephone Encounter (Signed)
I was researching my future LP's and noticed this patient had transferred care to [facility]. I contacted patient and told him with transfer of care, I would have to call [facility] and cancel order for this, since he will be under another physician care. Advised to [facility].

## 2023-10-07 ENCOUNTER — Other Ambulatory Visit: Payer: Self-pay

## 2023-10-07 ENCOUNTER — Ambulatory Visit (INDEPENDENT_AMBULATORY_CARE_PROVIDER_SITE_OTHER): Payer: 59

## 2023-10-07 ENCOUNTER — Ambulatory Visit (HOSPITAL_COMMUNITY)
Admission: RE | Admit: 2023-10-07 | Discharge: 2023-10-07 | Disposition: A | Discharge status: Home / Self Care | Payer: 59 | Source: Ambulatory Visit

## 2023-10-07 ENCOUNTER — Other Ambulatory Visit (HOSPITAL_BASED_OUTPATIENT_CLINIC_OR_DEPARTMENT_OTHER): Payer: Self-pay

## 2023-10-07 VITALS — BP 110/80 | HR 79 | Temp 97.5°F | Resp 14 | Ht 70.0 in | Wt 167.0 lb

## 2023-10-07 DIAGNOSIS — R5383 Other fatigue: Secondary | ICD-10-CM | POA: Diagnosis not present

## 2023-10-07 DIAGNOSIS — R569 Unspecified convulsions: Secondary | ICD-10-CM | POA: Diagnosis not present

## 2023-10-07 DIAGNOSIS — R42 Dizziness and giddiness: Secondary | ICD-10-CM | POA: Diagnosis not present

## 2023-10-07 LAB — ADMARK® APOE GENOTYPE ANALYSIS AND INTERPRETATION SYMPTOMATIC

## 2023-10-07 LAB — VITAMIN B1: Vitamin B1 (Thiamine): 36 nmol/L — ABNORMAL HIGH (ref 8–30)

## 2023-10-07 MED ORDER — BRIVIACT 50 MG PO TABS: 50.0000 mg | ORAL_TABLET | Freq: Every day | ORAL | 2 refills | Status: DC

## 2023-10-07 MED ORDER — BRIVIACT 50 MG PO TABS
1.0000 | ORAL_TABLET | Freq: Every day | ORAL | 0 refills | Status: DC
  Filled 2023-10-07: qty 90, 90d supply, fill #0

## 2023-10-07 NOTE — Progress Notes (Signed)
OP EEG complete - results pending.

## 2023-10-07 NOTE — Addendum Note (Signed)
Addended by: Precious Reel on: 10/07/2023 10:33 AM   Modules accepted: Orders

## 2023-10-07 NOTE — Procedures (Signed)
Patient Name: Jon Gonzales  MRN: [id number]  Epilepsy Attending: Charlsie Quest  Referring Physician/Provider: Dr Windell Moment, MD Date: 10/07/2023  Duration: 31.16 mins  Patient history: 62yo M with seizure like activity getting eeg to evaluate for seizure  Level of alertness: Awake  AEDs during EEG study: None  Technical aspects: This EEG study was done with scalp electrodes positioned according to the 10-20 International system of electrode placement. Electrical activity was reviewed with band pass filter of 1-70Hz , sensitivity of 7 uV/mm, display speed of 83mm/sec with a 60Hz  notched filter applied as appropriate. EEG data were recorded continuously and digitally stored.  Video monitoring was available and reviewed as appropriate.  Description: The posterior dominant rhythm consists of 8 Hz activity of moderate voltage (25-35 uV) seen predominantly in posterior head regions, symmetric and reactive to eye opening and eye closing. Hyperventilation did not show any EEG change.  Physiologic photic driving was seen during photic stimulation.   IMPRESSION: This study is within normal limits. No seizures or epileptiform discharges were seen throughout the recording.  A normal interictal EEG does not exclude the diagnosis of epilepsy.   Jon Gonzales

## 2023-10-07 NOTE — Assessment & Plan Note (Signed)
Patient in room with reporting dizziness and fatigue.  Describes it more like a spinning sensation. No witnessed or reported seizure-like spells.  Plan: he took 1 tablet of meclizine 25 mg in office here Recommended to hydrate well, take time changing positions and rest. Reached out to his neurologist who agreed with starting a low-dose BRIVIACT (brivaracetam).  A prescription for BRIVIACT 50 MG SENT TO HIS PHARMACY  Follow up as needed

## 2023-10-07 NOTE — Progress Notes (Signed)
Acute Office Visit  Subjective:    Patient ID: Jon Gonzales, male    DOB: Jan 20, 1961, 62 y.o.   MRN: [id number]  Chief Complaint  Patient presents with   Dizziness    Discussed the use of AI scribe software for clinical note transcription with the patient, who gave verbal consent to proceed.      HPI: Patient is in today for dizziness since this morning, fatigue. Has not taken his meclizine yet  Recent "seizure like activity at work on December 19, but no gagging, vomiting, transient weakness of the right upper and lower extremity and questionable deviation of mouth which only lasted few minutes.  EMS was called. Vitals were noted to be within normal range.  There was transient confusion which resolved within a few minutes. He was taken to [facility] in [place] where he had a thorough workup with CAT scan of the head, MRI, EEG and blood work all of which were reportedly normal  Complained of significant fatigue through the weekend with no further seizure-like spells.  Today, complained of dizziness, no other symptoms. Physical exam today is not significant for any weakness.   Past Medical History:  Diagnosis Date   Abnormal EKG 06/13/2023   Abnormal stress test small area of mild ischemia inferior wall 05/24/2022   Aphasia 06/13/2023   Arthropathy of lumbar facet joint 07/03/2023   Bilateral sacroiliitis (HCC) 08/29/2023   Bipolar 1 disorder 02/04/2023   Chronic bilateral low back pain with right-sided sciatica 06/24/2023   Chronic headaches 01/30/2021   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   Coronary artery disease involving coronary bypass graft of native heart with angina pectoris 05/22/2016   Cath 2013 LAD 95% stenosis, D1 90% stenosis, RCA 20%, Circ 30%. Previous stent to D1 2012. EF 60%.   Degeneration of lumbar intervertebral disc 07/03/2023   Dizziness 10/27/2020    Excessive daytime sleepiness 09/11/2016   Gastroesophageal reflux disease 02/04/2023   Generalized anxiety disorder 05/22/2016   Herpes zoster 01/20/2020   History of small bowel obstruction    Admitted 10-14-2007 for partical small bowel obstruction and N.G. tube was placed. Admitted by Dr Dimas Chyle. 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 Small internal hemorrhoids. Otherwise normal colonoscopy to terminal ileum   CORONARY ARTERY BYPASS GRAFT  02/19/2012   L - LAD, SVG - D1, SVG - OM   CORONARY STENT INTERVENTION N/A 11/05/2019   Procedure: CORONARY STENT INTERVENTION;  Surgeon: Runell Gess, MD;  Location: [facility];  Service: Cardiovascular;  Laterality: N/A;  SVG to DIAG   ESOPHAGOGASTRODUODENOSCOPY  01/08/2007   Multiple duodenal ulcers with stenosis/stricture of the distal first portion of the duodenum (measuring approximantely 9mm) Mild gastritis.   LEFT HEART CATH AND CORS/GRAFTS ANGIOGRAPHY N/A 11/05/2019   Procedure: LEFT HEART CATH AND CORS/GRAFTS  ANGIOGRAPHY;  Surgeon: Runell Gess, MD;  Location: [facility];  Service: Cardiovascular;  Laterality: N/A;   LEFT HEART CATH AND CORS/GRAFTS ANGIOGRAPHY N/A 08/29/2023   Procedure: LEFT HEART CATH AND CORS/GRAFTS ANGIOGRAPHY;  Surgeon: Kathleene Hazel, MD;  Location: [facility];  Service: Cardiovascular;  Laterality: N/A;   RENAL ANGIOGRAPHY N/A 11/05/2019   Procedure: RENAL ANGIOGRAPHY;  Surgeon: Runell Gess, MD;  Location: [facility];  Service: Cardiovascular;  Laterality: N/A;   TONSILLECTOMY AND ADENOIDECTOMY     WRIST SURGERY      Family History  Problem Relation Age of Onset   Aneurysm Mother 74       cerebral   CAD Father 42   Heart disease Sister        Unclear   Healthy Brother    Dementia Other        several individuals on extended paternal side of family    Social History   Socioeconomic History   Marital status: Married    Spouse name: Not on file   Number of children: Not on file   Years of education: 16   Highest education level: Bachelor's degree (e.g., BA, AB, BS)  Occupational History   Occupation: Oncologist: [employer]  Tobacco Use   Smoking status: Never   Smokeless tobacco: Never  Vaping Use   Vaping status: Never Used  Substance and Sexual Activity   Alcohol use: Yes    Alcohol/week: 14.0 standard drinks of alcohol    Types: 14 Cans of beer per week    Comment: ~ 2 beers nightly   Drug use: Never   Sexual activity: Not on file  Other Topics Concern   Not on file  Social History Narrative   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        Right Handed   Lives in two story home    Lives with wife   2 step children'   Social Drivers of Health   Financial Resource Strain: Low Risk  (06/04/2022)   Overall Financial Resource Strain (CARDIA)    Difficulty of Paying Living Expenses: Not hard at all  Food Insecurity: No Food  Insecurity (06/04/2022)   Hunger Vital Sign    Worried About Running Out of Food in the Last Year: Never true    Ran Out of Food in the Last Year: Never true  Transportation Needs: No Transportation Needs (06/04/2022)   PRAPARE - Administrator, Civil Service (Medical): No    Lack of Transportation (Non-Medical): No  Physical Activity: Inactive (06/04/2022)   Exercise Vital Sign    Days of Exercise per Week: 0 days    Minutes of Exercise per Session: 0 min  Stress: No Stress Concern Present (06/04/2022)   [employer] of Occupational Health - Occupational Stress Questionnaire    Feeling of Stress : Not at all  Social Connections: Moderately Integrated (06/04/2022)   Social Connection and Isolation Panel [NHANES]    Frequency of Communication with Friends and Family: More than three times a week  Frequency of Social Gatherings with Friends and Family: More than three times a week    Attends Religious Services: Never    Database administrator or Organizations: Yes    Attends Engineer, structural: More than 4 times per year    Marital Status: Married  Catering manager Violence: Not At Risk (06/04/2022)   Humiliation, Afraid, Rape, and Kick questionnaire    Fear of Current or Ex-Partner: No    Emotionally Abused: No    Physically Abused: No    Sexually Abused: No    Outpatient Medications Prior to Visit  Medication Sig Dispense Refill   aspirin EC 81 MG tablet Take 1 tablet (81 mg total) by mouth daily. 90 tablet 2   cariprazine (VRAYLAR) 3 MG capsule Take 1 capsule (3 mg total) by mouth daily. 90 capsule 0   clopidogrel (PLAVIX) 75 MG tablet Take 1 tablet (75 mg total) by mouth daily. 90 tablet 1   cyclobenzaprine (FLEXERIL) 10 MG tablet Take 1 tablet (10 mg total) by mouth 3 (three) times daily as needed for muscle spasms. 90 tablet 1   DULoxetine (CYMBALTA) 60 MG capsule Take 1 capsule (60 mg total) by mouth 2 (two) times daily. 180 capsule 2   EPINEPHrine  (EPIPEN 2-PAK) 0.3 mg/0.3 mL IJ SOAJ injection Inject 0.3 mg into the muscle as needed for anaphylaxis. 1 each 3   ezetimibe (ZETIA) 10 MG tablet Take 1 tablet (10 mg total) by mouth daily. (Patient taking differently: Take 10 mg by mouth at bedtime.) 90 tablet 2   fexofenadine (ALLEGRA) 60 MG tablet Take 1 tablet (60 mg total) by mouth daily. (Patient taking differently: Take 60 mg by mouth at bedtime.) 90 tablet 2   hydrochlorothiazide (HYDRODIURIL) 12.5 MG tablet Take 1 tablet (12.5 mg total) by mouth daily. 90 tablet 2   isosorbide mononitrate (IMDUR) 60 MG 24 hr tablet 2 po qd (Patient taking differently: Take 120 mg by mouth daily.)     lithium carbonate (LITHOBID) 300 MG ER tablet Take 1 tablet (300 mg total) by mouth 2 (two) times daily. 180 tablet 1   LORazepam (ATIVAN) 1 MG tablet Take 1 tablet (1 mg total) by mouth every 8 (eight) hours as needed for anxiety. (Patient taking differently: Take 1 mg by mouth See admin instructions. Take 1mg  by mouth twice a day. May take an additional dose as needed.) 90 tablet 1   meclizine (ANTIVERT) 25 MG tablet Take 1 tablet (25 mg total) by mouth 3 (three) times daily as needed for dizziness. 90 tablet 2   metoprolol succinate (TOPROL-XL) 50 MG 24 hr tablet Take 1 tablet by mouth daily. Take with or immediately following a meal. 90 tablet 2   Multiple Vitamin (MULTIVITAMIN WITH MINERALS) TABS tablet Take 1 tablet by mouth daily. Men's Multivitamin     nitroGLYCERIN (NITROSTAT) 0.4 MG SL tablet Dissolve 1 tablet (0.4 mg total) under the tongue every 5 (five) minutes up to 3 doses as needed for chest pain. If no relief after 3 doses call 911. 25 tablet 1   ondansetron (ZOFRAN) 4 MG tablet Take 1 tablet (4 mg total) by mouth every 8 (eight) hours as needed for nausea or vomiting. 20 tablet 0   pantoprazole (PROTONIX) 40 MG tablet Take 1 tablet (40 mg total) by mouth daily. 90 tablet 1   ranolazine (RANEXA) 1000 MG SR tablet Take 1 tablet by mouth 2 times  daily. 180 tablet 2   rosuvastatin (CRESTOR) 40 MG tablet  Take 1 tablet (40 mg total) by mouth daily. 90 tablet 1   Semaglutide-Weight Management (WEGOVY) 2.4 MG/0.75ML SOAJ Inject 2 mg into the skin once a week. Saturday     No facility-administered medications prior to visit.    Allergies  Allergen Reactions   Trintellix [Vortioxetine] Other (See Comments)    Headaches.   Penicillins Rash    Did it involve swelling of the face/tongue/throat, SOB, or low BP? Unknown Did it involve sudden or severe rash/hives, skin peeling, or any reaction on the inside of your mouth or nose? Unknown Did you need to seek medical attention at a hospital or doctor's office? Unknown When did it last happen? childhood reaction      If all above answers are "NO", may proceed with cephalosporin use.     Review of Systems  Constitutional:  Positive for fatigue.  Neurological:  Positive for dizziness.       Objective:        10/07/2023    9:48 AM 10/03/2023    6:10 PM 10/03/2023    3:23 PM  Vitals with BMI  Height 5\' 10"     Weight 167 lbs    BMI 23.96    Systolic 110 121   Diastolic 80 94   Pulse 79 80 73    No data found.   Physical Exam Vitals reviewed.  Constitutional:      General: He is not in acute distress.    Appearance: He is not ill-appearing or diaphoretic.  HENT:     Head: Normocephalic and atraumatic.  Cardiovascular:     Rate and Rhythm: Normal rate and regular rhythm.  Pulmonary:     Effort: Pulmonary effort is normal.     Breath sounds: Normal breath sounds.  Musculoskeletal:        General: Normal range of motion.  Neurological:     General: No focal deficit present.     Mental Status: He is alert and oriented to person, place, and time.  Psychiatric:        Mood and Affect: Mood normal.     Health Maintenance Due  Topic Date Due   Colonoscopy  09/10/2022   COVID-19 Vaccine (6 - 2024-25 season) 09/24/2023    There are no preventive care reminders to  display for this patient.   Lab Results  Component Value Date   TSH 0.650 06/12/2023   Lab Results  Component Value Date   WBC 9.8 10/03/2023   HGB 15.6 10/03/2023   HCT 46.0 10/03/2023   MCV 100.2 (H) 10/03/2023   PLT 151 10/03/2023   Lab Results  Component Value Date   NA 135 10/03/2023   K 3.9 10/03/2023   CO2 18 (L) 10/03/2023   GLUCOSE 143 (H) 10/03/2023   BUN 19 10/03/2023   CREATININE 1.10 10/03/2023   BILITOT 1.4 (H) 10/03/2023   ALKPHOS 41 10/03/2023   AST 39 10/03/2023   ALT 44 10/03/2023   PROT 7.2 10/03/2023   ALBUMIN 4.0 10/03/2023   CALCIUM 8.8 (L) 10/03/2023   ANIONGAP 11 10/03/2023   EGFR 66 06/13/2023   Lab Results  Component Value Date   CHOL 99 (L) 06/12/2023   Lab Results  Component Value Date   HDL 41 06/12/2023   Lab Results  Component Value Date   LDLCALC 38 06/12/2023   Lab Results  Component Value Date   TRIG 109 06/12/2023   Lab Results  Component Value Date   CHOLHDL 2.4 06/12/2023   No  results found for: "HGBA1C"     Assessment & Plan:  Dizziness Assessment & Plan: Patient in room with reporting dizziness and fatigue.  Describes it more like a spinning sensation. No witnessed or reported seizure-like spells.  Plan: he took 1 tablet of meclizine 25 mg in office here Recommended to hydrate well, take time changing positions and rest. Reached out to his neurologist who agreed with starting a low-dose BRIVIACT (brivaracetam).  A prescription for BRIVIACT 50 MG SENT TO HIS PHARMACY  Follow up as needed   Other fatigue  Other orders -     Briviact; Take 1 tablet by mouth daily.  Dispense: 90 tablet; Refill: 0     Meds ordered this encounter  Medications   Brivaracetam (BRIVIACT) 50 MG TABS    Sig: Take 1 tablet by mouth daily.    Dispense:  90 tablet    Refill:  0    No orders of the defined types were placed in this encounter.    Follow-up: No follow-ups on file.  An After Visit Summary was printed and  given to the patient.  Windell Moment, MD [facility] [phone]

## 2023-10-08 ENCOUNTER — Other Ambulatory Visit (HOSPITAL_BASED_OUTPATIENT_CLINIC_OR_DEPARTMENT_OTHER): Payer: Self-pay

## 2023-10-10 ENCOUNTER — Other Ambulatory Visit: Payer: 59

## 2023-10-10 ENCOUNTER — Other Ambulatory Visit: Payer: Self-pay | Admitting: Physician Assistant

## 2023-10-10 DIAGNOSIS — R413 Other amnesia: Secondary | ICD-10-CM | POA: Diagnosis not present

## 2023-10-15 LAB — ATN PROFILE
A -- Beta-amyloid 42/40 Ratio: 0.134 (ref >0.102)
Beta-amyloid 40: 194.02 pg/mL
Beta-amyloid 42: 26.09 pg/mL
N -- NfL, Plasma: 3.6 pg/mL (ref 0.00–4.61)
T -- p-tau181: 0.82 pg/mL (ref 0.00–0.97)

## 2023-10-17 ENCOUNTER — Other Ambulatory Visit: Payer: Self-pay | Admitting: Psychiatry

## 2023-10-17 ENCOUNTER — Other Ambulatory Visit (HOSPITAL_COMMUNITY): Payer: Self-pay

## 2023-10-17 ENCOUNTER — Other Ambulatory Visit: Payer: Self-pay | Admitting: Legal Medicine

## 2023-10-17 ENCOUNTER — Other Ambulatory Visit: Payer: Self-pay

## 2023-10-17 DIAGNOSIS — R42 Dizziness and giddiness: Secondary | ICD-10-CM

## 2023-10-17 DIAGNOSIS — F411 Generalized anxiety disorder: Secondary | ICD-10-CM

## 2023-10-17 DIAGNOSIS — F314 Bipolar disorder, current episode depressed, severe, without psychotic features: Secondary | ICD-10-CM

## 2023-10-17 MED ORDER — LORAZEPAM 1 MG PO TABS
1.0000 mg | ORAL_TABLET | Freq: Three times a day (TID) | ORAL | 0 refills | Status: DC | PRN
Start: 2023-10-17 — End: 2023-11-28
  Filled 2023-10-17: qty 270, 90d supply, fill #0

## 2023-10-17 MED ORDER — CARIPRAZINE HCL 3 MG PO CAPS
3.0000 mg | ORAL_CAPSULE | Freq: Every day | ORAL | 0 refills | Status: DC
  Filled 2023-10-17: qty 90, 90d supply, fill #0

## 2023-10-17 NOTE — Telephone Encounter (Signed)
 Lf 11/13 lv 12/5

## 2023-10-18 ENCOUNTER — Other Ambulatory Visit (HOSPITAL_COMMUNITY): Payer: Self-pay

## 2023-10-21 ENCOUNTER — Other Ambulatory Visit (HOSPITAL_COMMUNITY): Payer: Self-pay

## 2023-10-23 ENCOUNTER — Other Ambulatory Visit: Payer: Self-pay

## 2023-10-23 DIAGNOSIS — M545 Low back pain, unspecified: Secondary | ICD-10-CM

## 2023-10-23 MED ORDER — CYCLOBENZAPRINE HCL 5 MG PO TABS: 5.0000 mg | ORAL_TABLET | Freq: Three times a day (TID) | ORAL | 0 refills | Status: AC | PRN

## 2023-11-07 ENCOUNTER — Other Ambulatory Visit: Payer: Self-pay

## 2023-11-07 ENCOUNTER — Encounter: Payer: Self-pay | Admitting: Neurology

## 2023-11-07 ENCOUNTER — Ambulatory Visit (INDEPENDENT_AMBULATORY_CARE_PROVIDER_SITE_OTHER): Payer: 59 | Admitting: Neurology

## 2023-11-07 ENCOUNTER — Other Ambulatory Visit (HOSPITAL_COMMUNITY): Payer: Self-pay

## 2023-11-07 VITALS — BP 109/73 | HR 78 | Ht 70.0 in | Wt 182.0 lb

## 2023-11-07 DIAGNOSIS — R419 Unspecified symptoms and signs involving cognitive functions and awareness: Secondary | ICD-10-CM

## 2023-11-07 DIAGNOSIS — G251 Drug-induced tremor: Secondary | ICD-10-CM

## 2023-11-07 DIAGNOSIS — R6889 Other general symptoms and signs: Secondary | ICD-10-CM

## 2023-11-07 DIAGNOSIS — R413 Other amnesia: Secondary | ICD-10-CM

## 2023-11-07 MED ORDER — ATOMOXETINE HCL 25 MG PO CAPS
25.0000 mg | ORAL_CAPSULE | Freq: Every day | ORAL | 5 refills | Status: DC
  Filled 2023-11-07: qty 30, 30d supply, fill #0

## 2023-11-07 NOTE — Progress Notes (Signed)
[facility]  Provider:  Dr Varvara Legault Referring Provider: [name], PA-C Primary Care Physician:  [name], PA-C  Chief Complaint  Patient presents with   New Patient (Initial Visit)    Pt in room 2 wife in room. Internal referral for memory changes/possible seizures. Pt reports being forgetting things, short term. Pt said last 2 years memory has gotten worse last 2 years. Has improved in last couple of weeks. Pt has been seen in ER due to possible stroke like symptoms.     HPI:  Jon Gonzales is a 63 y.o. male and seen here upon referral from Dr. Earlene Plater for a Consultation/ Evaluation of mental status changes, of spells that were at times looking like a stroke ( August 2024) and  in December had another episode as a code stroke. He is amnestic for the events. He has noted increased confusion of dates and days of the week  started about 2 years ago and much worsened since these occurred.  In December he was in hospital , possible postictal confusion, and had sudden and forceful isolated myoclonic movements, jerkin his arm up and almost slamming the head into the wall before he vomited.  2 EEGs were read as normal. Memory care at [facility] neurology has seen him.  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  Comparison studies from 2020,2021 and 2023.   Lithium levels are normal but balance is poor, has a Menieres dx. MCV / MCHhas been elevated for a long time ( years) tinnitus in both ears. Nausea and vertigo.  One time bilirubin elevated 10-03-2023.   ATN level was normal. Memory impairment is not due to AD.    11/07/2023   12:01 PM 09/21/2023    3:00 PM  Montreal Cognitive Assessment   Visuospatial/ Executive (0/5) 4 5  Naming (0/3) 3 3  Attention: Read list of digits (0/2) 2 2  Attention: Read list of letters (0/1) 1 1  Attention: Serial 7 subtraction starting at 100 (0/3) 2 1  Language: Repeat phrase (0/2) 2 1  Language : Fluency  (0/1) 0 0  Abstraction (0/2) 2 1  Delayed Recall (0/5) 0 0  Orientation (0/6) 6 6  Total 22 20  Adjusted Score (based on education)  20     Tension headaches - a band around the head, not that painful.  This happens before his spell come on.    Chronic pain back, sees now pain management.     Review of Systems: Out of a complete 14 system review, the patient complains of only the following symptoms, and all other reviewed systems are negative.   Epworth Sleepiness score:  12/24 points.   FSS:  50/ 63    GDS 7 / 15     MOCA 22/ 30   Social History   Socioeconomic History   Marital status: Married    Spouse name: Not on file   Number of children: Not on file   Years of education: 16   Highest education level: Bachelor's degree (e.g., BA, AB, BS)  Occupational History   Occupation: Oncologist: [employer]  Tobacco Use   Smoking status: Never   Smokeless tobacco: Never  Vaping Use   Vaping status: Never Used  Substance and Sexual Activity   Alcohol use: Yes    Alcohol/week: 14.0 standard drinks of alcohol    Types: 14 Cans of beer per week  Comment: ~ 2 beers nightly   Drug use: Never   Sexual activity: Not on file  Other Topics Concern   Not on file  Social History Narrative   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        Right Handed   Lives in two story home    Lives with wife   2 step children'   Social Drivers of Health   Financial Resource Strain: Low Risk  (06/04/2022)   Overall Financial Resource Strain (CARDIA)    Difficulty of Paying Living Expenses: Not hard at all  Food Insecurity: No Food Insecurity (06/04/2022)   Hunger Vital Sign    Worried About Running Out of Food in the Last Year: Never true    Ran Out of Food in the Last Year: Never true  Transportation Needs: No Transportation Needs (06/04/2022)   PRAPARE - Administrator, Civil Service  (Medical): No    Lack of Transportation (Non-Medical): No  Physical Activity: Inactive (06/04/2022)   Exercise Vital Sign    Days of Exercise per Week: 0 days    Minutes of Exercise per Session: 0 min  Stress: No Stress Concern Present (06/04/2022)   [employer] of Occupational Health - Occupational Stress Questionnaire    Feeling of Stress : Not at all  Social Connections: Moderately Integrated (06/04/2022)   Social Connection and Isolation Panel [NHANES]    Frequency of Communication with Friends and Family: More than three times a week    Frequency of Social Gatherings with Friends and Family: More than three times a week    Attends Religious Services: Never    Database administrator or Organizations: Yes    Attends Engineer, structural: More than 4 times per year    Marital Status: Married  Catering manager Violence: Not At Risk (06/04/2022)   Humiliation, Afraid, Rape, and Kick questionnaire    Fear of Current or Ex-Partner: No    Emotionally Abused: No    Physically Abused: No    Sexually Abused: No    Family History  Problem Relation Age of Onset   Aneurysm Mother 54       cerebral   CAD Father 2   Heart disease Sister        Unclear   Healthy Brother    Dementia Other        several individuals on extended paternal side of family    Past Medical History:  Diagnosis Date   Abnormal EKG 06/13/2023   Abnormal stress test small area of mild ischemia inferior wall 05/24/2022   Aphasia 06/13/2023   Arthropathy of lumbar facet joint 07/03/2023   Bilateral sacroiliitis (HCC) 08/29/2023   Bipolar 1 disorder 02/04/2023   Chronic bilateral low back pain with right-sided sciatica 06/24/2023   Chronic headaches 01/30/2021   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   Coronary artery disease involving coronary bypass graft of native heart with angina pectoris 05/22/2016   Cath 2013  LAD 95% stenosis, D1 90% stenosis, RCA 20%, Circ 30%. Previous stent to D1 2012. EF 60%.   Degeneration of lumbar intervertebral disc 07/03/2023   Dizziness 10/27/2020   Excessive daytime sleepiness 09/11/2016   Gastroesophageal reflux disease 02/04/2023   Generalized anxiety disorder 05/22/2016   Herpes zoster 01/20/2020   History of small bowel obstruction    Admitted 10-14-2007 for  partical small bowel obstruction and N.G. tube was placed. Admitted by Dr Dimas Chyle. 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 Small internal hemorrhoids. Otherwise normal colonoscopy to terminal ileum   CORONARY ARTERY BYPASS GRAFT  02/19/2012   L - LAD, SVG - D1, SVG - OM   CORONARY STENT INTERVENTION N/A 11/05/2019   Procedure: CORONARY STENT INTERVENTION;  Surgeon: Runell Gess, MD;  Location: [facility];  Service: Cardiovascular;  Laterality: N/A;  SVG to DIAG   ESOPHAGOGASTRODUODENOSCOPY  01/08/2007   Multiple duodenal ulcers with stenosis/stricture of the distal first portion of the  duodenum (measuring approximantely 9mm) Mild gastritis.   LEFT HEART CATH AND CORS/GRAFTS ANGIOGRAPHY N/A 11/05/2019   Procedure: LEFT HEART CATH AND CORS/GRAFTS ANGIOGRAPHY;  Surgeon: Runell Gess, MD;  Location: [facility];  Service: Cardiovascular;  Laterality: N/A;   LEFT HEART CATH AND CORS/GRAFTS ANGIOGRAPHY N/A 08/29/2023   Procedure: LEFT HEART CATH AND CORS/GRAFTS ANGIOGRAPHY;  Surgeon: Kathleene Hazel, MD;  Location: [facility];  Service: Cardiovascular;  Laterality: N/A;   RENAL ANGIOGRAPHY N/A 11/05/2019   Procedure: RENAL ANGIOGRAPHY;  Surgeon: Runell Gess, MD;  Location: [facility];  Service: Cardiovascular;  Laterality: N/A;   TONSILLECTOMY AND ADENOIDECTOMY     WRIST SURGERY      Current Outpatient Medications  Medication Sig Dispense Refill   aspirin EC 81 MG tablet Take 1 tablet (81 mg total) by mouth daily. 90 tablet 2   cariprazine (VRAYLAR) 3 MG capsule Take 1 capsule (3 mg total) by mouth daily. 90 capsule 0   clopidogrel (PLAVIX) 75 MG tablet Take 1 tablet (75 mg total) by mouth daily. 90 tablet 1   DULoxetine (CYMBALTA) 60 MG capsule Take 1 capsule (60 mg total) by mouth 2 (two) times daily. 180 capsule 2   EPINEPHrine (EPIPEN 2-PAK) 0.3 mg/0.3 mL IJ SOAJ injection Inject 0.3 mg into the muscle as needed for anaphylaxis. 1 each 3   ezetimibe (ZETIA) 10 MG tablet Take 1 tablet (10 mg total) by mouth daily. (Patient taking differently: Take 10 mg by mouth at bedtime.) 90 tablet 2   fexofenadine (ALLEGRA) 60 MG tablet Take 1 tablet (60 mg total) by mouth daily. (Patient taking differently: Take 60 mg by mouth at bedtime.) 90 tablet 2   hydrochlorothiazide (HYDRODIURIL) 12.5 MG tablet Take 1 tablet (12.5 mg total) by mouth daily. 90 tablet 2   isosorbide mononitrate (IMDUR) 60 MG 24 hr tablet 2 po qd (Patient taking differently: Take 120 mg by mouth daily.)     lithium carbonate (LITHOBID)  300 MG ER tablet Take 1 tablet (300 mg  total) by mouth 2 (two) times daily. 180 tablet 1   LORazepam (ATIVAN) 1 MG tablet Take 1 tablet (1 mg total) by mouth every 8 (eight) hours as needed for anxiety. 270 tablet 0   meclizine (ANTIVERT) 25 MG tablet Take 1 tablet (25 mg total) by mouth 3 (three) times daily as needed for dizziness. 90 tablet 2   metoprolol succinate (TOPROL-XL) 50 MG 24 hr tablet Take 1 tablet by mouth daily. Take with or immediately following a meal. 90 tablet 2   Multiple Vitamin (MULTIVITAMIN WITH MINERALS) TABS tablet Take 1 tablet by mouth daily. Men's Multivitamin     nitroGLYCERIN (NITROSTAT) 0.4 MG SL tablet Dissolve 1 tablet (0.4 mg total) under the tongue every 5 (five) minutes up to 3 doses as needed for chest pain. If no relief after 3 doses call 911. 25 tablet 1   ondansetron (ZOFRAN) 4 MG tablet Take 1 tablet (4 mg total) by mouth every 8 (eight) hours as needed for nausea or vomiting. 20 tablet 0   pantoprazole (PROTONIX) 40 MG tablet Take 1 tablet (40 mg total) by mouth daily. 90 tablet 1   ranolazine (RANEXA) 1000 MG SR tablet Take 1 tablet by mouth 2 times daily. 180 tablet 2   rosuvastatin (CRESTOR) 40 MG tablet Take 1 tablet (40 mg total) by mouth daily. 90 tablet 1   Semaglutide-Weight Management (WEGOVY) 2.4 MG/0.75ML SOAJ Inject 2 mg into the skin once a week. Saturday     Brivaracetam (BRIVIACT) 50 MG TABS Take 50 mg by mouth daily. (Patient not taking: Reported on 11/07/2023) 30 tablet 2   Brivaracetam (BRIVIACT) 50 MG TABS Take 1 tablet by mouth daily. (Patient not taking: Reported on 11/07/2023) 90 tablet 0   No current facility-administered medications for this visit.    Allergies as of 11/07/2023 - Review Complete 11/07/2023  Allergen Reaction Noted   Trintellix [vortioxetine] Other (See Comments) 10/30/2019   Penicillins Rash 11/09/2014    Vitals: BP 109/73 (BP Location: Left Arm, Patient Position: Sitting, Cuff Size: Normal)   Pulse 78   Ht 5\' 10"  (1.778 m)   Wt 182 lb (82.6 kg)    BMI 26.11 kg/m  Last Weight:  Wt Readings from Last 1 Encounters:  11/07/23 182 lb (82.6 kg)   Last Height:   Ht Readings from Last 1 Encounters:  11/07/23 5\' 10"  (1.778 m)    Physical exam:  General: The patient is awake, alert and appears not in acute distress.  The patient is well groomed. Head: Normocephalic, atraumatic.  Neck is supple.    Neck circumference:15.5  Cardiovascular:  Regular rate and palpable peripheral pulse:  Respiratory: clear to auscultation.  Mallampati2, Skin:  Without evidence of edema, or rash Trunk: BMI is reduced now -lost weight on wegovy.   and patient  has normal posture.   Neurologic exam : The patient is awake and alert, oriented to place and time.  Memory subjective  described as impaired  MOCA 22 / 30  There is a normal attention span & concentration ability.  Speech is non-fluent without  dysarthria, dysphonia , he has naming trouble, and  often drops of mid sentence, trouble to find a friends name.  aphasia.  Mood and affect are depressed, fatigued, posture is not erect/   Cranial nerves: Pupils are equal and briskly reactive to light. Funduscopic exam without  evidence of pallor or edema. Extraocular movements  in vertical and horizontal  planes intact and without nystagmus. Visual fields by finger perimetry are intact. Hearing to finger rub intact.   Vibration sensation is preserved.  Facial sensation intact to fine touch. Facial motor strength is symmetric and tongue and uvula move midline.  Motor exam:   Normal tone and normal muscle bulk and symmetric normal strength in all extremities. Grip Strength equal. Left leg weaker in hip flexion, pain radiated to the buttocks and around the hip.  Proximal strength of shoulder muscles and hip flexors was intact  . Left leg feels heavier.   Sensory:  Fine touch and vibration were tested . Proprioception was tested in the upper extremities only and was impaired , mild tremor     Coordination: Rapid alternating movements in the fingers/hands slowed - very slow   Finger-to-nose maneuver was tested and showed right over left  dysmetria , mild  tremor.  Gait and station: Patient walked without assistive device  Core Strength within normal limits.  Stance is stable and of normal base.  Tandem gait is impaired - but no clear fall risk, not drifting into one direction, and  turns with 3 Steps-unfragmented.  Romberg testing is showing swaying - he could stay standing when pushed.   Deep tendon reflexes: in the  upper and lower extremities are symmetric and  brisk without Clonus. Babinski maneuver response is  downgoing.   Assessment: Total time for face to face interview and examination, for review of  images and laboratory testing, neurophysiology testing and pre-existing records, including out-of -network , was 55 minutes. 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  2)  Balance is impaired  but no clear cause has been dx.  Not cerebellar,  not ataxic.  3) Strattera ? He may need that extra boost, more alertness, more focus.  4) spells are not epileptiform and stroke work up was negative.   Thinking of cataplexy/ narcolepsy-    Plan:  Treatment plan and additional workup planned after today includes:   Narcolepsy panel : he cannot undergo PSG and MSLT.   Dr Haywood Lasso: I want his  input for Strattera. Can help with lightheadedness.    Melvyn Novas, MD   Testing by [facility] again in 4 months.

## 2023-11-07 NOTE — Patient Instructions (Signed)
Dizziness Dizziness is a common problem. It makes you feel unsteady or light-headed. You may feel like you're about to faint. Dizziness can lead to getting hurt if you stumble or fall. It's more common to feel dizzy if you're an older adult. Many things can cause you to feel dizzy. These include: Medicines. Dehydration. This is when there's not enough water in your body. Illness. Follow these instructions at home: Eating and drinking  Drink enough fluid to keep your pee (urine) pale yellow. This helps keep you from getting dehydrated. Try to drink more clear fluids, such as water. Do not drink alcohol. Try to limit how much caffeine you take in. Try to limit how much salt, also called sodium, you take in. Activity Try not to make quick movements. Stand up slowly from sitting in a chair. Steady yourself until you feel okay. In the morning, first sit up on the side of the bed. When you feel okay, hold onto something and slowly stand up. Do this until you know that your balance is okay. If you need to stand in one place for a long time, move your legs often. Tighten and relax the muscles in your legs while you're standing. Do not drive or use machines if you feel dizzy. Avoid bending down if you feel dizzy. Place items in your home so you can reach them without leaning over. Lifestyle Do not smoke, vape, or use products with nicotine or tobacco in them. If you need help quitting, talk with your health care provider. Try to lower your stress level. You can do this by using methods like yoga or meditation. Talk with your provider if you need help. General instructions Watch your dizziness for any changes. Take your medicines only as told by your provider. Talk with your provider if you think you're dizzy because of a medicine you're taking. Tell a friend or a family member that you're feeling dizzy. If they spot any changes in your behavior, have them call your provider. Contact a health care  provider if: Your dizziness doesn't go away, or you have new symptoms. Your dizziness gets worse. You feel like you may vomit. You have trouble hearing. You have a fever. You have neck pain or a stiff neck. You fall or get hurt. Get help right away if: You vomit each time you eat or drink. You have watery poop and can't eat or drink. You have trouble talking, walking, swallowing, or using your arms, hands, or legs. You feel very weak. You're bleeding. You're not thinking clearly, or you have trouble forming sentences. A friend or family member may spot this. Your vision changes, or you get a very bad headache. These symptoms may be an emergency. Call 911 right away. Do not wait to see if the symptoms will go away. Do not drive yourself to the hospital. This information is not intended to replace advice given to you by your health care provider. Make sure you discuss any questions you have with your health care provider. Document Revised: 07/04/2023 Document Reviewed: 11/15/2022 Elsevier Patient Education  2024 Elsevier Inc.     Transient global amnesia causes a sudden and temporary (transient) loss of memory (amnesia). You may remember memories from your distant past and people you know well. However, you may not remember things that happened more recently in the past days, months, or even year. A transient global amnesia episode does not last longer than 24 hours. Transient global amnesia does not affect your other brain functions.  Your memory usually returns to normal after an episode is over. One episode of transient global amnesia does not make you more likely to have a stroke, a relapse, or other complications. What are the causes? The cause of this condition is not known. Certain activities have been reported to trigger transient global amnesia. These activities include: Extreme temperatures or high altitude environments. Sexual intercourse. Short-term (acute)  illness. Emotional distress, such as receiving bad news or having a lot of stress at once. Strenuous exercise or activity. What increases the risk? You are more likely to develop this condition if: You are 5-36 years old. You have a history of migraine headaches. What are the signs or symptoms? The main symptoms of this condition include: Being unable to remember recent events. Asking repetitive questions about a situation and surroundings and not recalling the answers to these questions. Other symptoms include: Restlessness and nervousness. Confusion. Headaches. Blurry vision. Dizziness. Nausea or vomiting. How is this diagnosed? This condition may be diagnosed based on: Your symptoms. A physical exam. A test to check your mental abilities (cognitive evaluation). Blood or urine tests. Imaging studies to check brain function. These may include: Electroencephalogram (EEG). This test checks the brain's electrical activity. CT scan. MRI. How is this treated? There is no treatment for this condition. An episode typically goes away on its own after a few hours. You may receive medicines to treat other conditions, such as a migraine. Follow these instructions at home: Take over-the-counter and prescription medicines only as told by your health care provider. Avoid taking medicines that can affect thinking, such as pain or sleeping medicines. Learn what activities may trigger an episode. Avoid these activities as told by your health care provider. Find ways to manage stress, such as meditation or yoga. Keep all follow-up visits. Contact a health care provider if: You have a migraine that does not go away. You experience transient global amnesia repeatedly. Get help right away if: You have a seizure observed by someone else. These symptoms may be an emergency. Get help right away. Call 911. Do not wait to see if the symptoms will go away. Do not drive yourself to the  hospital. Summary Transient global amnesia causes a sudden and temporary (transient) loss of memory (amnesia). Transient global amnesia does not affect your other brain functions. Your memory usually returns to normal after an episode is over. There is no treatment for this condition. An episode typically goes away on its own after a few hours. You may receive medicines to treat other conditions, such as a migraine. This information is not intended to replace advice given to you by your health care provider. Make sure you discuss any questions you have with your health care provider. Document Revised: 11/20/2021 Document Reviewed: 11/20/2021 Elsevier Patient Education  2024 [employer].

## 2023-11-14 ENCOUNTER — Other Ambulatory Visit: Payer: Self-pay | Admitting: Physician Assistant

## 2023-11-14 ENCOUNTER — Other Ambulatory Visit (HOSPITAL_COMMUNITY): Payer: Self-pay

## 2023-11-14 ENCOUNTER — Other Ambulatory Visit: Payer: Self-pay

## 2023-11-14 DIAGNOSIS — I25118 Atherosclerotic heart disease of native coronary artery with other forms of angina pectoris: Secondary | ICD-10-CM

## 2023-11-14 LAB — NARCOLEPSY EVALUATION
DQA1*01:02: POSITIVE
DQB1*06:02: POSITIVE

## 2023-11-14 MED ORDER — ISOSORBIDE MONONITRATE ER 60 MG PO TB24: 120.0000 mg | ORAL_TABLET | Freq: Every day | ORAL | 1 refills | Status: DC

## 2023-11-14 MED ORDER — EZETIMIBE 10 MG PO TABS
10.0000 mg | ORAL_TABLET | Freq: Every day | ORAL | 1 refills | Status: DC
  Filled 2023-11-14: qty 90, 90d supply, fill #0
  Filled 2023-12-19 – 2024-02-03 (×9): qty 90, 90d supply, fill #1
  Filled 2024-02-10: qty 30, 30d supply, fill #1
  Filled 2024-02-24 – 2024-03-16 (×3): qty 30, 30d supply, fill #2
  Filled 2024-04-09: qty 30, 30d supply, fill #3
  Filled ????-??-??: fill #1

## 2023-11-15 ENCOUNTER — Other Ambulatory Visit: Payer: Self-pay

## 2023-11-15 ENCOUNTER — Encounter: Payer: Self-pay | Admitting: Neurology

## 2023-11-18 ENCOUNTER — Telehealth: Payer: Self-pay | Admitting: *Deleted

## 2023-11-18 NOTE — Telephone Encounter (Signed)
Left message for patient to call or open my chart to read result.

## 2023-11-18 NOTE — Telephone Encounter (Signed)
-----   Message from [place] Dohmeier sent at 11/15/2023  1:02 PM EST ----- Double positive narcolepsy HLA  biomarker Marland Kitchen)

## 2023-11-18 NOTE — Telephone Encounter (Signed)
Pt PCP informed pt  and wife with labs results.

## 2023-11-25 DIAGNOSIS — M5416 Radiculopathy, lumbar region: Secondary | ICD-10-CM | POA: Diagnosis not present

## 2023-11-26 ENCOUNTER — Encounter: Payer: Self-pay | Admitting: Physician Assistant

## 2023-11-26 ENCOUNTER — Ambulatory Visit (INDEPENDENT_AMBULATORY_CARE_PROVIDER_SITE_OTHER): Payer: 59 | Admitting: Physician Assistant

## 2023-11-26 VITALS — BP 100/68 | HR 85 | Temp 97.5°F | Resp 18 | Ht 70.0 in | Wt 180.0 lb

## 2023-11-26 DIAGNOSIS — I2510 Atherosclerotic heart disease of native coronary artery without angina pectoris: Secondary | ICD-10-CM

## 2023-11-26 DIAGNOSIS — I10 Essential (primary) hypertension: Secondary | ICD-10-CM

## 2023-11-26 DIAGNOSIS — F411 Generalized anxiety disorder: Secondary | ICD-10-CM | POA: Diagnosis not present

## 2023-11-26 DIAGNOSIS — R400 Somnolence: Secondary | ICD-10-CM | POA: Diagnosis not present

## 2023-11-26 DIAGNOSIS — Z1211 Encounter for screening for malignant neoplasm of colon: Secondary | ICD-10-CM

## 2023-11-26 DIAGNOSIS — R5383 Other fatigue: Secondary | ICD-10-CM

## 2023-11-26 DIAGNOSIS — R29818 Other symptoms and signs involving the nervous system: Secondary | ICD-10-CM | POA: Diagnosis not present

## 2023-11-26 NOTE — Progress Notes (Signed)
Acute Office Visit  Subjective:    Patient ID: Jon Gonzales, male    DOB: 20-Dec-1960, 63 y.o.   MRN: [id number]  Chief Complaint  Patient presents with   Fatigue    HPI: Patient is in today for extreme fatigue and daytime somnolence.  Pt states that for the past several weeks he has felt tired and sleepy all the time despite getting adequate rest.  He has seen neurology and had a sleep study several years ago but was not diagnosed with sleep apnea.  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  He has not followed up with her regarding this issue yet to discuss further treatment.  Given his chronic medical history and other chronic issues he would like labwork done as well to rule out other etiologies of extreme fatigue. He states he is not able to think well, concentrate or remember.  He was started on strattera 25mg  qd then increased to bid but has not noticed a difference in his symptoms  Pt with hypertension - stable on current medications - BP good today and denies chest pain/sob Pt with CAD - due for repeat lipid panel  Pt with depression/anxiety -stable on current meds and follows with psychiatry  It is noted pt is due for colonoscopy and agreeable to schedule appt with GI   Current Outpatient Medications:    aspirin EC 81 MG tablet, Take 1 tablet (81 mg total) by mouth daily., Disp: 90 tablet, Rfl: 2   atomoxetine (STRATTERA) 25 MG capsule, Take 1 capsule (25 mg total) by mouth daily., Disp: 30 capsule, Rfl: 5   cariprazine (VRAYLAR) 3 MG capsule, Take 1 capsule (3 mg total) by mouth daily., Disp: 90 capsule, Rfl: 0   clopidogrel (PLAVIX) 75 MG tablet, Take 1 tablet (75 mg total) by mouth daily., Disp: 90 tablet, Rfl: 1   DULoxetine (CYMBALTA) 60 MG capsule, Take 1 capsule (60 mg total) by mouth 2 (two) times daily., Disp: 180 capsule, Rfl: 2   EPINEPHrine (EPIPEN 2-PAK) 0.3 mg/0.3 mL IJ SOAJ injection, Inject 0.3 mg into the muscle  as needed for anaphylaxis., Disp: 1 each, Rfl: 3   ezetimibe (ZETIA) 10 MG tablet, Take 1 tablet (10 mg total) by mouth daily., Disp: 90 tablet, Rfl: 1   fexofenadine (ALLEGRA) 60 MG tablet, Take 1 tablet (60 mg total) by mouth daily. (Patient taking differently: Take 60 mg by mouth at bedtime.), Disp: 90 tablet, Rfl: 2   hydrochlorothiazide (HYDRODIURIL) 12.5 MG tablet, Take 1 tablet (12.5 mg total) by mouth daily., Disp: 90 tablet, Rfl: 2   isosorbide mononitrate (IMDUR) 60 MG 24 hr tablet, Take 2 tablets (120 mg total) by mouth daily., Disp: 180 tablet, Rfl: 1   lithium carbonate (LITHOBID) 300 MG ER tablet, Take 1 tablet (300 mg total) by mouth 2 (two) times daily., Disp: 180 tablet, Rfl: 1   LORazepam (ATIVAN) 1 MG tablet, Take 1 tablet (1 mg total) by mouth every 8 (eight) hours as needed for anxiety., Disp: 270 tablet, Rfl: 0   meclizine (ANTIVERT) 25 MG tablet, Take 1 tablet (25 mg total) by mouth 3 (three) times daily as needed for dizziness., Disp: 90 tablet, Rfl: 2   metoprolol succinate (TOPROL-XL) 50 MG 24 hr tablet, Take 1 tablet by mouth daily. Take with or immediately following a meal., Disp: 90 tablet, Rfl: 2   Multiple Vitamin (MULTIVITAMIN WITH MINERALS) TABS tablet, Take 1 tablet by mouth daily. Men's Multivitamin,  Disp: , Rfl:    nitroGLYCERIN (NITROSTAT) 0.4 MG SL tablet, Dissolve 1 tablet (0.4 mg total) under the tongue every 5 (five) minutes up to 3 doses as needed for chest pain. If no relief after 3 doses call 911., Disp: 25 tablet, Rfl: 1   ondansetron (ZOFRAN) 4 MG tablet, Take 1 tablet (4 mg total) by mouth every 8 (eight) hours as needed for nausea or vomiting., Disp: 20 tablet, Rfl: 0   pantoprazole (PROTONIX) 40 MG tablet, Take 1 tablet (40 mg total) by mouth daily., Disp: 90 tablet, Rfl: 1   ranolazine (RANEXA) 1000 MG SR tablet, Take 1 tablet by mouth 2 times daily., Disp: 180 tablet, Rfl: 2   rosuvastatin (CRESTOR) 40 MG tablet, Take 1 tablet (40 mg total) by mouth  daily., Disp: 90 tablet, Rfl: 1   Semaglutide-Weight Management (WEGOVY) 2.4 MG/0.75ML SOAJ, Inject 2 mg into the skin once a week. Saturday, Disp: , Rfl:   Allergies  Allergen Reactions   Trintellix [Vortioxetine] Other (See Comments)    Headaches.   Penicillins Rash    Did it involve swelling of the face/tongue/throat, SOB, or low BP? Unknown Did it involve sudden or severe rash/hives, skin peeling, or any reaction on the inside of your mouth or nose? Unknown Did you need to seek medical attention at a hospital or doctor's office? Unknown When did it last happen? childhood reaction      If all above answers are "NO", may proceed with cephalosporin use.     ROS CONSTITUTIONAL: see HPI E/N/T: Negative for ear pain, nasal congestion and sore throat.  CARDIOVASCULAR: Negative for chest pain, dizziness, palpitations and pedal edema.  RESPIRATORY: Negative for recent cough and dyspnea.  GASTROINTESTINAL: Negative for abdominal pain, acid reflux symptoms, constipation, diarrhea, nausea and vomiting.  INTEGUMENTARY: Negative for rash.  NEUROLOGICAL: pt with chronic Menieres  PSYCHIATRIC: see HPI     Objective:    PHYSICAL EXAM:   BP 100/68 (BP Location: Left Arm, Patient Position: Sitting, Cuff Size: Normal)   Pulse 85   Temp (!) 97.5 F (36.4 C) (Temporal)   Resp 18   Ht 5\' 10"  (1.778 m)   Wt 180 lb (81.6 kg)   SpO2 97%   BMI 25.83 kg/m    GEN: Well nourished, well developed, in no acute distress but is slightly tired  Cardiac: RRR; no murmurs, rubs, or gallops,no edema - Respiratory:  normal respiratory rate and pattern with no distress - normal breath sounds with no rales, rhonchi, wheezes or rubs  Skin: warm and dry, no rash  Neuro:  Alert and Oriented x 3,- CN II-Xii grossly intact Psych: euthymic mood, appropriate affect and demeanor     Assessment & Plan:    Other fatigue -     CBC with Differential/Platelet -     Comprehensive metabolic panel -     Thyroid  Panel With TSH -     Testosterone,Free and Total -     B12 and Folate Panel -     Iron, TIBC and Ferritin Panel -     VITAMIN D 25 Hydroxy (Vit-D Deficiency, Fractures) -     Methylmalonic acid, serum  Daytime somnolence -     CBC with Differential/Platelet -     Comprehensive metabolic panel -     Thyroid Panel With TSH -     Testosterone,Free and Total -     B12 and Folate Panel -     Iron, TIBC and Ferritin Panel -  VITAMIN D 25 Hydroxy (Vit-D Deficiency, Fractures) -     Methylmalonic acid, serum  Atherosclerosis of native coronary artery of native heart without angina pectoris -     Lipid panel  GAD (generalized anxiety disorder) -     Thyroid Panel With TSH  Colon cancer screening -     Ambulatory referral to Gastroenterology  Primary hypertension -     CBC with Differential/Platelet -     Comprehensive metabolic panel -     Thyroid Panel With TSH -     Lipid panel  Neurological abnormality     Follow-up: Return in about 3 months (around 02/23/2024) for chronic fasting follow-up.  An After Visit Summary was printed and given to the patient.  Jettie Pagan [facility] [phone]

## 2023-11-28 ENCOUNTER — Telehealth: Payer: Self-pay | Admitting: Neurology

## 2023-11-28 ENCOUNTER — Other Ambulatory Visit: Payer: Self-pay | Admitting: Psychiatry

## 2023-11-28 DIAGNOSIS — F319 Bipolar disorder, unspecified: Secondary | ICD-10-CM

## 2023-11-28 DIAGNOSIS — R258 Other abnormal involuntary movements: Secondary | ICD-10-CM

## 2023-11-28 DIAGNOSIS — R4701 Aphasia: Secondary | ICD-10-CM

## 2023-11-28 DIAGNOSIS — G4751 Confusional arousals: Secondary | ICD-10-CM

## 2023-11-28 DIAGNOSIS — R442 Other hallucinations: Secondary | ICD-10-CM

## 2023-11-28 DIAGNOSIS — F314 Bipolar disorder, current episode depressed, severe, without psychotic features: Secondary | ICD-10-CM

## 2023-11-28 DIAGNOSIS — F411 Generalized anxiety disorder: Secondary | ICD-10-CM

## 2023-11-28 DIAGNOSIS — F488 Other specified nonpsychotic mental disorders: Secondary | ICD-10-CM

## 2023-11-28 DIAGNOSIS — G4719 Other hypersomnia: Secondary | ICD-10-CM

## 2023-11-28 NOTE — Telephone Encounter (Signed)
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 Mickey Gonzales.  Dr. 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  An HLA test showed positive biomarkers.  These were 2 alleles that were positive. Jon Gonzales acknowledged that he is very sleepy and has little sleep attacks during the day when he finds himself suddenly waking up but is surprised that he fell asleep.  These may just last a couple of minutes.  He is sleepy him he is also depressed and concerned about Jon work performance as he feels that Jon mind is unfocused.  He has a history of Mnire's disease as vertigo but he reports also some tension headaches recently and he states that these micro sleep attacks do not refreshing him.  He also reports dreams or visions when he wakes out of sleep and these may be hypnopompic hallucinations.  We discussed briefly the pathophysiology of narcolepsy and cataplexy for this send 63 year old gentleman who does also report that Strattera is not working for Jon ADD-ADHD and he has to remain on antidepressants so a valid MSLT for the testing of narcolepsy is not possible.  He will also stay on duloxetine.  From Jon psychological state is not safe to wean him of these 2 medications. He will also stay on Ativan which she currently takes twice daily 1 mg.  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 With that diagnosis we have at least the option of Xyrem or Xywav treatment. I explained the specific nature of Xyrem and its sodium load which could give complications in a patient with Mnire's disease, fluid retention due to sodium can play a role in vertigo inducement.  We would have Xywav available instead  of the high sodium Xyrem.   I will order a polysomnography for this patient followed by MSLT.  I will send a copy of this note to Fidela Juneau, PA who is Jon acting primary care provider.  I will also copy this letter to the patient himself.  The patient may approach Jon primary care physician for any FMLA related questions.  Sincerely, Jon Novas MD

## 2023-11-29 ENCOUNTER — Other Ambulatory Visit (HOSPITAL_COMMUNITY): Payer: Self-pay

## 2023-11-29 ENCOUNTER — Other Ambulatory Visit: Payer: Self-pay

## 2023-11-29 LAB — COMPREHENSIVE METABOLIC PANEL
ALT: 32 [IU]/L (ref 0–44)
AST: 28 [IU]/L (ref 0–40)
Albumin: 4.4 g/dL (ref 3.9–4.9)
Alkaline Phosphatase: 49 [IU]/L (ref 44–121)
BUN/Creatinine Ratio: 15 (ref 10–24)
BUN: 19 mg/dL (ref 8–27)
Bilirubin Total: 0.6 mg/dL (ref 0.0–1.2)
CO2: 21 mmol/L (ref 20–29)
Calcium: 9.3 mg/dL (ref 8.6–10.2)
Chloride: 102 mmol/L (ref 96–106)
Creatinine, Ser: 1.26 mg/dL (ref 0.76–1.27)
Globulin, Total: 2.2 g/dL (ref 1.5–4.5)
Glucose: 168 mg/dL — ABNORMAL HIGH (ref 70–99)
Potassium: 4.7 mmol/L (ref 3.5–5.2)
Sodium: 140 mmol/L (ref 134–144)
Total Protein: 6.6 g/dL (ref 6.0–8.5)
eGFR: 65 mL/min/{1.73_m2} (ref >59)

## 2023-11-29 LAB — CBC WITH DIFFERENTIAL/PLATELET
Basophils Absolute: 0 10*3/uL (ref 0.0–0.2)
Basos: 0 %
EOS (ABSOLUTE): 0 10*3/uL (ref 0.0–0.4)
Eos: 0 %
Hematocrit: 39.7 % (ref 37.5–51.0)
Hemoglobin: 13.6 g/dL (ref 13.0–17.7)
Immature Grans (Abs): 0.2 10*3/uL — ABNORMAL HIGH (ref 0.0–0.1)
Immature Granulocytes: 1 %
Lymphocytes Absolute: 1 10*3/uL (ref 0.7–3.1)
Lymphs: 5 %
MCH: 34.8 pg — ABNORMAL HIGH (ref 26.6–33.0)
MCHC: 34.3 g/dL (ref 31.5–35.7)
MCV: 102 fL — ABNORMAL HIGH (ref 79–97)
Monocytes Absolute: 1 10*3/uL — ABNORMAL HIGH (ref 0.1–0.9)
Monocytes: 5 %
Neutrophils Absolute: 16.8 10*3/uL — ABNORMAL HIGH (ref 1.4–7.0)
Neutrophils: 89 %
Platelets: 190 10*3/uL (ref 150–450)
RBC: 3.91 x10E6/uL — ABNORMAL LOW (ref 4.14–5.80)
RDW: 12.5 % (ref 11.6–15.4)
WBC: 19 10*3/uL — ABNORMAL HIGH (ref 3.4–10.8)

## 2023-11-29 LAB — THYROID PANEL WITH TSH
Free Thyroxine Index: 1.9 (ref 1.2–4.9)
T3 Uptake Ratio: 24 % (ref 24–39)
T4, Total: 8 ug/dL (ref 4.5–12.0)
TSH: 0.616 u[IU]/mL (ref 0.450–4.500)

## 2023-11-29 LAB — B12 AND FOLATE PANEL
Folate: >20 ng/mL (ref >3.0)
Vitamin B-12: 673 pg/mL (ref 232–1245)

## 2023-11-29 LAB — VITAMIN D 25 HYDROXY (VIT D DEFICIENCY, FRACTURES): Vit D, 25-Hydroxy: 47.5 ng/mL (ref 30.0–100.0)

## 2023-11-29 LAB — LIPID PANEL
Chol/HDL Ratio: 2 {ratio} (ref 0.0–5.0)
Cholesterol, Total: 131 mg/dL (ref 100–199)
HDL: 65 mg/dL (ref >39)
LDL Chol Calc (NIH): 54 mg/dL (ref 0–99)
Triglycerides: 55 mg/dL (ref 0–149)
VLDL Cholesterol Cal: 12 mg/dL (ref 5–40)

## 2023-11-29 LAB — TESTOSTERONE,FREE AND TOTAL
Testosterone, Free: 1.1 pg/mL — ABNORMAL LOW (ref 6.6–18.1)
Testosterone: 313 ng/dL (ref 264–916)

## 2023-11-29 LAB — IRON,TIBC AND FERRITIN PANEL
Ferritin: 91 ng/mL (ref 30–400)
Iron Saturation: 20 % (ref 15–55)
Iron: 76 ug/dL (ref 38–169)
Total Iron Binding Capacity: 389 ug/dL (ref 250–450)
UIBC: 313 ug/dL (ref 111–343)

## 2023-11-29 LAB — METHYLMALONIC ACID, SERUM: Methylmalonic Acid: 192 nmol/L (ref 0–378)

## 2023-11-29 MED ORDER — CARIPRAZINE HCL 3 MG PO CAPS
3.0000 mg | ORAL_CAPSULE | Freq: Every day | ORAL | 0 refills | Status: DC
  Filled 2023-11-29 – 2024-01-23 (×11): qty 30, 30d supply, fill #0
  Filled ????-??-??: fill #0

## 2023-11-29 MED ORDER — LORAZEPAM 1 MG PO TABS
1.0000 mg | ORAL_TABLET | Freq: Three times a day (TID) | ORAL | 0 refills | Status: DC | PRN
  Filled 2023-11-29 – 2024-03-19 (×2): qty 90, 30d supply, fill #0

## 2023-11-29 NOTE — Telephone Encounter (Signed)
LF 1/3 LV 12/5

## 2023-11-29 NOTE — Telephone Encounter (Signed)
LF 1/3 LV 12/5  Please send for CC.

## 2023-12-02 ENCOUNTER — Other Ambulatory Visit (HOSPITAL_COMMUNITY): Payer: Self-pay

## 2023-12-02 ENCOUNTER — Encounter: Payer: Self-pay | Admitting: Physician Assistant

## 2023-12-02 ENCOUNTER — Other Ambulatory Visit (INDEPENDENT_AMBULATORY_CARE_PROVIDER_SITE_OTHER): Payer: Self-pay | Admitting: Physician Assistant

## 2023-12-02 ENCOUNTER — Ambulatory Visit (INDEPENDENT_AMBULATORY_CARE_PROVIDER_SITE_OTHER): Payer: 59 | Admitting: Physician Assistant

## 2023-12-02 ENCOUNTER — Other Ambulatory Visit: Payer: Self-pay

## 2023-12-02 ENCOUNTER — Ambulatory Visit (INDEPENDENT_AMBULATORY_CARE_PROVIDER_SITE_OTHER)
Admission: RE | Admit: 2023-12-02 | Discharge: 2023-12-02 | Disposition: A | Discharge status: Home / Self Care | Payer: 59 | Source: Ambulatory Visit | Attending: Physician Assistant | Admitting: Physician Assistant

## 2023-12-02 VITALS — BP 112/62 | HR 74 | Temp 97.5°F | Resp 16 | Ht 70.0 in | Wt 164.0 lb

## 2023-12-02 DIAGNOSIS — R058 Other specified cough: Secondary | ICD-10-CM

## 2023-12-02 DIAGNOSIS — R899 Unspecified abnormal finding in specimens from other organs, systems and tissues: Secondary | ICD-10-CM | POA: Diagnosis not present

## 2023-12-02 DIAGNOSIS — D72829 Elevated white blood cell count, unspecified: Secondary | ICD-10-CM

## 2023-12-02 DIAGNOSIS — R739 Hyperglycemia, unspecified: Secondary | ICD-10-CM | POA: Diagnosis not present

## 2023-12-02 DIAGNOSIS — D729 Disorder of white blood cells, unspecified: Secondary | ICD-10-CM

## 2023-12-02 DIAGNOSIS — J3089 Other allergic rhinitis: Secondary | ICD-10-CM | POA: Diagnosis not present

## 2023-12-02 DIAGNOSIS — R059 Cough, unspecified: Secondary | ICD-10-CM

## 2023-12-02 LAB — POCT URINALYSIS DIP (CLINITEK)
Bilirubin, UA: NEGATIVE
Blood, UA: NEGATIVE
Glucose, UA: NEGATIVE mg/dL
Ketones, POC UA: NEGATIVE mg/dL
Leukocytes, UA: NEGATIVE
Nitrite, UA: NEGATIVE
POC PROTEIN,UA: NEGATIVE
Spec Grav, UA: 1.015 (ref 1.010–1.025)
Urobilinogen, UA: NEGATIVE U/dL
pH, UA: 6 (ref 5.0–8.0)

## 2023-12-02 MED ORDER — AZITHROMYCIN 250 MG PO TABS: ORAL_TABLET | ORAL | 0 refills | Status: AC

## 2023-12-02 MED ORDER — AZITHROMYCIN 250 MG PO TABS
ORAL_TABLET | ORAL | 0 refills | Status: DC
Start: 2023-12-02 — End: 2023-12-02
  Filled 2023-12-02: qty 6, 5d supply, fill #0

## 2023-12-02 NOTE — Addendum Note (Signed)
 Addended by: Langley Gauss on: 12/02/2023 04:43 PM   Modules accepted: Orders

## 2023-12-02 NOTE — Assessment & Plan Note (Addendum)
 Dry cough since last Thursday, causing sleep disturbance. No fever, chills, or productive cough. Lungs clear on auscultation. Possible viral etiology or allergy flare. -Order Albuterol inhaler to relieve inflammation and open up the lungs. -Elevated white count, will send for chest x-ray and begin empiric antibiotics

## 2023-12-02 NOTE — Assessment & Plan Note (Signed)
 Patient reports having seasonal allergies and is currently on Allegra. -Continue Allegra as it seems to be effective. -The Albuterol inhaler will also help if the cough is related to an allergy irritant.

## 2023-12-02 NOTE — Progress Notes (Addendum)
 Acute Office Visit  Subjective:    Patient ID: Jon Gonzales, male    DOB: Feb 09, 1961, 63 y.o.   MRN: [id number]  Chief Complaint  Patient presents with   URI   HPI: Patient is in today for cough  Discussed the use of AI scribe software for clinical note transcription with the patient, who gave verbal consent to proceed.  History of Present Illness   The patient presents with a persistent cough that started last Thursday. The cough is described as dry, without any phlegm production. The patient denies any associated fevers or chills but reports feeling tired all the time. He believes he is getting adequate sleep at night. No one else in his immediate environment has been sick recently. The patient has tested negative for both COVID and flu. The cough is bothersome enough to wake him at night. He also has a history of seasonal allergies and is currently taking Allegra, which he reports helps manage his symptoms.       Past Medical History:  Diagnosis Date   Abnormal EKG 06/13/2023   Abnormal stress test small area of mild ischemia inferior wall 05/24/2022   Aphasia 06/13/2023   Arthropathy of lumbar facet joint 07/03/2023   Bilateral sacroiliitis (HCC) 08/29/2023   Bipolar 1 disorder 02/04/2023   Chronic bilateral low back pain with right-sided sciatica 06/24/2023   Chronic headaches 01/30/2021   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   Coronary artery disease involving coronary bypass graft of native heart with angina pectoris 05/22/2016   Cath 2013 LAD 95% stenosis, D1 90% stenosis, RCA 20%, Circ 30%. Previous stent to D1 2012. EF 60%.   Degeneration of lumbar intervertebral disc 07/03/2023   Dizziness 10/27/2020   Excessive daytime sleepiness 09/11/2016   Gastroesophageal reflux disease 02/04/2023   Generalized anxiety disorder 05/22/2016   Herpes zoster 01/20/2020   History of small bowel  obstruction    Admitted 10-14-2007 for partical small bowel obstruction and N.G. tube was placed. Admitted by Dr Dimas Chyle. 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 Small internal hemorrhoids. Otherwise normal colonoscopy to terminal ileum   CORONARY ARTERY BYPASS GRAFT  02/19/2012   L - LAD, SVG - D1, SVG - OM   CORONARY STENT INTERVENTION N/A 11/05/2019   Procedure: CORONARY STENT INTERVENTION;  Surgeon: Runell Gess, MD;  Location: [facility];  Service: Cardiovascular;  Laterality: N/A;  SVG to DIAG   ESOPHAGOGASTRODUODENOSCOPY  01/08/2007   Multiple duodenal ulcers with stenosis/stricture of the distal first portion  of the duodenum (measuring approximantely 9mm) Mild gastritis.   LEFT HEART CATH AND CORS/GRAFTS ANGIOGRAPHY N/A 11/05/2019   Procedure: LEFT HEART CATH AND CORS/GRAFTS ANGIOGRAPHY;  Surgeon: Runell Gess, MD;  Location: [facility];  Service: Cardiovascular;  Laterality: N/A;   LEFT HEART CATH AND CORS/GRAFTS ANGIOGRAPHY N/A 08/29/2023    Procedure: LEFT HEART CATH AND CORS/GRAFTS ANGIOGRAPHY;  Surgeon: Kathleene Hazel, MD;  Location: [facility];  Service: Cardiovascular;  Laterality: N/A;   RENAL ANGIOGRAPHY N/A 11/05/2019   Procedure: RENAL ANGIOGRAPHY;  Surgeon: Runell Gess, MD;  Location: [facility];  Service: Cardiovascular;  Laterality: N/A;   TONSILLECTOMY AND ADENOIDECTOMY     WRIST SURGERY      Family History  Problem Relation Age of Onset   Aneurysm Mother 73       cerebral   CAD Father 32   Heart disease Sister        Unclear   Healthy Brother    Dementia Other        several individuals on extended paternal side of family    Social History   Socioeconomic History   Marital status: Married    Spouse name: Not on file   Number of children: Not on file   Years of education: 16   Highest education level: Bachelor's degree (e.g., BA, AB, BS)  Occupational History   Occupation: Oncologist: [employer]  Tobacco Use   Smoking status: Never   Smokeless tobacco: Never  Vaping Use   Vaping status: Never Used  Substance and Sexual Activity   Alcohol use: Yes    Alcohol/week: 14.0 standard drinks of alcohol    Types: 14 Cans of beer per week    Comment: ~ 2 beers nightly   Drug use: Never   Sexual activity: Not on file  Other Topics Concern   Not on file  Social History Narrative   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        Right Handed   Lives in two story home    Lives with wife   2 step children'   Social Drivers of Health   Financial Resource Strain: Low Risk  (06/04/2022)   Overall Financial Resource Strain (CARDIA)    Difficulty of Paying Living Expenses: Not hard at all  Food Insecurity: No Food Insecurity (06/04/2022)   Hunger Vital Sign    Worried About Running Out of Food in the Last Year: Never true    Ran Out of Food in the Last Year: Never true  Transportation Needs: No  Transportation Needs (06/04/2022)   PRAPARE - Administrator, Civil Service (Medical): No    Lack of Transportation (Non-Medical): No  Physical Activity: Inactive (06/04/2022)   Exercise Vital Sign    Days of Exercise per Week: 0 days    Minutes of Exercise per Session: 0 min  Stress: No Stress Concern Present (06/04/2022)   [employer] of Occupational Health - Occupational Stress Questionnaire    Feeling of Stress : Not at all  Social Connections: Moderately Integrated (06/04/2022)   Social Connection and Isolation Panel [NHANES]    Frequency of Communication with Friends and Family: More than three times a week    Frequency of Social Gatherings with Friends and Family: More than three times a week    Attends Religious Services: Never  Active Member of Clubs or Organizations: Yes    Attends Banker Meetings: More than 4 times per year    Marital Status: Married  Catering manager Violence: Not At Risk (06/04/2022)   Humiliation, Afraid, Rape, and Kick questionnaire    Fear of Current or Ex-Partner: No    Emotionally Abused: No    Physically Abused: No    Sexually Abused: No    Outpatient Medications Prior to Visit  Medication Sig Dispense Refill   aspirin EC 81 MG tablet Take 1 tablet (81 mg total) by mouth daily. 90 tablet 2   atomoxetine (STRATTERA) 25 MG capsule Take 1 capsule (25 mg total) by mouth daily. 30 capsule 5   cariprazine (VRAYLAR) 3 MG capsule Take 1 capsule (3 mg total) by mouth daily. 30 capsule 0   clopidogrel (PLAVIX) 75 MG tablet Take 1 tablet (75 mg total) by mouth daily. 90 tablet 1   DULoxetine (CYMBALTA) 60 MG capsule Take 1 capsule (60 mg total) by mouth 2 (two) times daily. 180 capsule 2   EPINEPHrine (EPIPEN 2-PAK) 0.3 mg/0.3 mL IJ SOAJ injection Inject 0.3 mg into the muscle as needed for anaphylaxis. 1 each 3   ezetimibe (ZETIA) 10 MG tablet Take 1 tablet (10 mg total) by mouth daily. 90 tablet 1   fexofenadine (ALLEGRA) 60  MG tablet Take 1 tablet (60 mg total) by mouth daily. (Patient taking differently: Take 60 mg by mouth at bedtime.) 90 tablet 2   hydrochlorothiazide (HYDRODIURIL) 12.5 MG tablet Take 1 tablet (12.5 mg total) by mouth daily. 90 tablet 2   isosorbide mononitrate (IMDUR) 60 MG 24 hr tablet Take 2 tablets (120 mg total) by mouth daily. 180 tablet 1   lithium carbonate (LITHOBID) 300 MG ER tablet Take 1 tablet (300 mg total) by mouth 2 (two) times daily. 180 tablet 1   LORazepam (ATIVAN) 1 MG tablet Take 1 tablet (1 mg total) by mouth every 8 (eight) hours as needed for anxiety. 90 tablet 0   meclizine (ANTIVERT) 25 MG tablet Take 1 tablet (25 mg total) by mouth 3 (three) times daily as needed for dizziness. 90 tablet 2   metoprolol succinate (TOPROL-XL) 50 MG 24 hr tablet Take 1 tablet by mouth daily. Take with or immediately following a meal. 90 tablet 2   Multiple Vitamin (MULTIVITAMIN WITH MINERALS) TABS tablet Take 1 tablet by mouth daily. Men's Multivitamin     nitroGLYCERIN (NITROSTAT) 0.4 MG SL tablet Dissolve 1 tablet (0.4 mg total) under the tongue every 5 (five) minutes up to 3 doses as needed for chest pain. If no relief after 3 doses call 911. 25 tablet 1   ondansetron (ZOFRAN) 4 MG tablet Take 1 tablet (4 mg total) by mouth every 8 (eight) hours as needed for nausea or vomiting. 20 tablet 0   pantoprazole (PROTONIX) 40 MG tablet Take 1 tablet (40 mg total) by mouth daily. 90 tablet 1   ranolazine (RANEXA) 1000 MG SR tablet Take 1 tablet by mouth 2 times daily. 180 tablet 2   rosuvastatin (CRESTOR) 40 MG tablet Take 1 tablet (40 mg total) by mouth daily. 90 tablet 1   Semaglutide-Weight Management (WEGOVY) 2.4 MG/0.75ML SOAJ Inject 2 mg into the skin once a week. Saturday     No facility-administered medications prior to visit.    Allergies  Allergen Reactions   Trintellix [Vortioxetine] Other (See Comments)    Headaches.   Penicillins Rash    Did it involve swelling of  the  face/tongue/throat, SOB, or low BP? Unknown Did it involve sudden or severe rash/hives, skin peeling, or any reaction on the inside of your mouth or nose? Unknown Did you need to seek medical attention at a hospital or doctor's office? Unknown When did it last happen? childhood reaction      If all above answers are "NO", may proceed with cephalosporin use.     Review of Systems  Constitutional:  Positive for fatigue. Negative for chills and fever.  HENT:  Positive for rhinorrhea and sore throat. Negative for congestion, ear pain, sinus pressure and sinus pain.   Respiratory:  Positive for cough and wheezing. Negative for shortness of breath.   Cardiovascular:  Negative for chest pain and palpitations.  Gastrointestinal:  Negative for abdominal pain, constipation, diarrhea, nausea and vomiting.  Genitourinary:  Negative for difficulty urinating and dysuria.  Musculoskeletal:  Negative for arthralgias, back pain and myalgias.  Skin:  Negative for rash.  Neurological:  Negative for dizziness and headaches.  Psychiatric/Behavioral:  Negative for dysphoric mood.        Objective:        12/02/2023    7:51 AM 11/26/2023   10:22 AM 11/07/2023   11:53 AM  Vitals with BMI  Height 5\' 10"  5\' 10"  5\' 10"   Weight 164 lbs 180 lbs 182 lbs  BMI 23.53 25.83 26.11  Systolic 112 100 409  Diastolic 62 68 73  Pulse 74 85 78    No data found.   Physical Exam Vitals reviewed.  Constitutional:      Appearance: Normal appearance.  Cardiovascular:     Rate and Rhythm: Normal rate and regular rhythm.     Heart sounds: Normal heart sounds.  Pulmonary:     Effort: Pulmonary effort is normal.     Breath sounds: Wheezing present.  Abdominal:     General: Bowel sounds are normal.     Palpations: Abdomen is soft.     Tenderness: There is no abdominal tenderness.  Neurological:     Mental Status: He is alert and oriented to person, place, and time.  Psychiatric:        Mood and Affect: Mood  normal.        Behavior: Behavior normal.     Health Maintenance Due  Topic Date Due   Colonoscopy  09/10/2022    There are no preventive care reminders to display for this patient.   Lab Results  Component Value Date   TSH 0.616 11/26/2023   Lab Results  Component Value Date   WBC 19.0 (H) 11/26/2023   HGB 13.6 11/26/2023   HCT 39.7 11/26/2023   MCV 102 (H) 11/26/2023   PLT 190 11/26/2023   Lab Results  Component Value Date   NA 140 11/26/2023   K 4.7 11/26/2023   CO2 21 11/26/2023   GLUCOSE 168 (H) 11/26/2023   BUN 19 11/26/2023   CREATININE 1.26 11/26/2023   BILITOT 0.6 11/26/2023   ALKPHOS 49 11/26/2023   AST 28 11/26/2023   ALT 32 11/26/2023   PROT 6.6 11/26/2023   ALBUMIN 4.4 11/26/2023   CALCIUM 9.3 11/26/2023   ANIONGAP 11 10/03/2023   EGFR 65 11/26/2023   Lab Results  Component Value Date   CHOL 131 11/26/2023   Lab Results  Component Value Date   HDL 65 11/26/2023   Lab Results  Component Value Date   LDLCALC 54 11/26/2023   Lab Results  Component Value Date   TRIG 55 11/26/2023  Lab Results  Component Value Date   CHOLHDL 2.0 11/26/2023   No results found for: "HGBA1C"     Assessment & Plan:  Dry cough Assessment & Plan: Dry cough since last Thursday, causing sleep disturbance. No fever, chills, or productive cough. Lungs clear on auscultation. Possible viral etiology or allergy flare. -Order Albuterol inhaler to relieve inflammation and open up the lungs.    Environmental and seasonal allergies Assessment & Plan: Patient reports having seasonal allergies and is currently on Allegra. -Continue Allegra as it seems to be effective. -The Albuterol inhaler will also help if the cough is related to an allergy irritant.       No orders of the defined types were placed in this encounter.   No orders of the defined types were placed in this encounter.      Follow-up: No follow-ups on file.  An After Visit Summary was  printed and given to the patient.  Langley Gauss, [facility] [phone]

## 2023-12-03 ENCOUNTER — Other Ambulatory Visit: Payer: Self-pay

## 2023-12-03 LAB — CBC WITH DIFFERENTIAL/PLATELET
Basophils Absolute: 0 10*3/uL (ref 0.0–0.2)
Basos: 0 %
EOS (ABSOLUTE): 0.2 10*3/uL (ref 0.0–0.4)
Eos: 3 %
Hematocrit: 39.7 % (ref 37.5–51.0)
Hemoglobin: 13.9 g/dL (ref 13.0–17.7)
Immature Grans (Abs): 0.1 10*3/uL (ref 0.0–0.1)
Immature Granulocytes: 1 %
Lymphocytes Absolute: 0.8 10*3/uL (ref 0.7–3.1)
Lymphs: 10 %
MCH: 35.2 pg — ABNORMAL HIGH (ref 26.6–33.0)
MCHC: 35 g/dL (ref 31.5–35.7)
MCV: 101 fL — ABNORMAL HIGH (ref 79–97)
Monocytes Absolute: 1 10*3/uL — ABNORMAL HIGH (ref 0.1–0.9)
Monocytes: 12 %
Neutrophils Absolute: 5.9 10*3/uL (ref 1.4–7.0)
Neutrophils: 74 %
Platelets: 159 10*3/uL (ref 150–450)
RBC: 3.95 x10E6/uL — ABNORMAL LOW (ref 4.14–5.80)
RDW: 12.8 % (ref 11.6–15.4)
WBC: 8 10*3/uL (ref 3.4–10.8)

## 2023-12-03 LAB — HEMOGLOBIN A1C
Est. average glucose Bld gHb Est-mCnc: 105 mg/dL
Hgb A1c MFr Bld: 5.3 % (ref 4.8–5.6)

## 2023-12-04 ENCOUNTER — Other Ambulatory Visit (HOSPITAL_COMMUNITY): Payer: Self-pay

## 2023-12-04 ENCOUNTER — Other Ambulatory Visit: Payer: Self-pay

## 2023-12-05 ENCOUNTER — Encounter: Payer: Self-pay | Admitting: Neurology

## 2023-12-05 ENCOUNTER — Encounter: Payer: Self-pay | Admitting: Physician Assistant

## 2023-12-06 ENCOUNTER — Other Ambulatory Visit (HOSPITAL_BASED_OUTPATIENT_CLINIC_OR_DEPARTMENT_OTHER): Payer: Self-pay

## 2023-12-06 ENCOUNTER — Other Ambulatory Visit: Payer: Self-pay | Admitting: Physician Assistant

## 2023-12-06 ENCOUNTER — Other Ambulatory Visit (HOSPITAL_COMMUNITY): Payer: Self-pay

## 2023-12-06 MED ORDER — ISOSORBIDE MONONITRATE ER 60 MG PO TB24
120.0000 mg | ORAL_TABLET | Freq: Every day | ORAL | 1 refills | Status: DC
  Filled 2023-12-06: qty 180, 90d supply, fill #0
  Filled 2023-12-19 – 2024-01-23 (×8): qty 180, 90d supply, fill #1
  Filled 2024-02-17 (×2): qty 50, 25d supply, fill #1
  Filled 2024-02-24 – 2024-03-10 (×2): qty 50, 25d supply, fill #2
  Filled 2024-03-16: qty 60, 30d supply, fill #2
  Filled 2024-04-09: qty 60, 30d supply, fill #3

## 2023-12-06 NOTE — Telephone Encounter (Signed)
 done

## 2023-12-11 ENCOUNTER — Telehealth: Payer: Self-pay | Admitting: Neurology

## 2023-12-11 MED ORDER — SUNOSI 75 MG PO TABS: 75.0000 mg | ORAL_TABLET | ORAL | Status: DC

## 2023-12-11 MED ORDER — SUNOSI 150 MG PO TABS: 150.0000 mg | ORAL_TABLET | ORAL | Status: DC

## 2023-12-11 NOTE — Telephone Encounter (Signed)
 Received a call from the wife stating that the patient has been really struggling with focusing and being able to get work done. She states that his SS is not scheduled until April and was wanting to know if medication could be started in meantime. He was started on Strattera and that has not helped.  He does have CAD history and would like to make sure that is in mind when thinking about what medication to try. Advised I would ask Dr Vickey Huger what she feels may be best option for him to try and that he may still want to run it by his cardiologist to see if its ok.  She asked about the xywav and I advised that there is no way to get that approved until we have valid testing. Informed I would reach out once discussed with Dr Vickey Huger

## 2023-12-11 NOTE — Telephone Encounter (Signed)
 Spoke with Dr Dohmeier who states that she would be ok with the pt starting sunosi temp until further testing can be completed. Sunosi should be able to get approved from [facility]. I have routed this information to the pt and wife and awaiting to hear back.

## 2023-12-11 NOTE — Addendum Note (Signed)
 Addended by: Judi Cong on: 12/11/2023 01:26 PM   Modules accepted: Orders

## 2023-12-12 ENCOUNTER — Other Ambulatory Visit (HOSPITAL_COMMUNITY): Payer: Self-pay

## 2023-12-16 ENCOUNTER — Other Ambulatory Visit: Payer: Self-pay | Admitting: Neurology

## 2023-12-17 NOTE — Telephone Encounter (Signed)
 Dr Sedalia Muta reached out to the pt's MD to discuss medications to prepare weaning off of meds prior to [facility] scheduled in April.  Per Dr Jennelle Human: he could stop the lithium for 5-7 days ahead of the study and it would be out of his system.  he's on high dose duloxetine and it would be difficult to come off this but not imposssible but would take 2 weeks to stop and another week to clear his body.  of course he could get more depressed without them.  the Leafy Kindle has a very long half life and would take weeks to get out of his system.  it is not practical nor useful to stop it as he is likely to have a prolonged period of suffering waiting for it to get out of his system. I agree he would need to be off the Sunosi and lorazepam before the study.  that should not be too difficult as he can abruptly stop them.  Dr Vickey Huger has stated at least needs to be off the [facility] for 7 days best would be 14 days.   Pt will remain on Vraylar and not wean off of this for the upcoming test. Pt's wife was agreeable to this plan.

## 2023-12-17 NOTE — Telephone Encounter (Signed)
 [facility] a[employer]na no auth req for either studies.  Patient is scheduled at [facility] for 02/09/24 at 8 pm & Monday 02/10/24.  I mailed packet of information to the patient and also what he needs to wean off of with his medication.

## 2023-12-18 ENCOUNTER — Ambulatory Visit: Payer: 59 | Admitting: Psychiatry

## 2023-12-18 DIAGNOSIS — M545 Low back pain, unspecified: Secondary | ICD-10-CM | POA: Diagnosis not present

## 2023-12-19 ENCOUNTER — Other Ambulatory Visit: Payer: Self-pay

## 2023-12-19 ENCOUNTER — Other Ambulatory Visit: Payer: Self-pay | Admitting: Physician Assistant

## 2023-12-19 DIAGNOSIS — K219 Gastro-esophageal reflux disease without esophagitis: Secondary | ICD-10-CM

## 2023-12-20 ENCOUNTER — Telehealth: Payer: Self-pay | Admitting: Psychiatry

## 2023-12-20 NOTE — Telephone Encounter (Signed)
 RTC wife:

## 2023-12-23 ENCOUNTER — Other Ambulatory Visit: Payer: Self-pay

## 2023-12-23 MED ORDER — PANTOPRAZOLE SODIUM 40 MG PO TBEC
40.0000 mg | DELAYED_RELEASE_TABLET | Freq: Every day | ORAL | 1 refills | Status: DC
  Filled 2023-12-23 – 2024-02-03 (×9): qty 90, 90d supply, fill #0
  Filled 2024-02-10: qty 30, 30d supply, fill #0
  Filled 2024-02-24 – 2024-03-16 (×3): qty 30, 30d supply, fill #1
  Filled 2024-04-09: qty 30, 30d supply, fill #2
  Filled 2024-05-05 – 2024-05-06 (×2): qty 30, 30d supply, fill #3
  Filled 2024-06-02: qty 30, 30d supply, fill #4
  Filled 2024-07-07: qty 30, 30d supply, fill #5

## 2023-12-23 MED ORDER — ROSUVASTATIN CALCIUM 40 MG PO TABS
40.0000 mg | ORAL_TABLET | Freq: Every day | ORAL | 1 refills | Status: DC
  Filled 2023-12-23 – 2024-02-03 (×9): qty 90, 90d supply, fill #0
  Filled 2024-02-10: qty 30, 30d supply, fill #0
  Filled 2024-02-24 – 2024-03-16 (×3): qty 30, 30d supply, fill #1
  Filled 2024-04-09: qty 30, 30d supply, fill #2
  Filled 2024-05-05 – 2024-05-06 (×2): qty 30, 30d supply, fill #3
  Filled 2024-06-02: qty 30, 30d supply, fill #4
  Filled 2024-07-07: qty 30, 30d supply, fill #5

## 2023-12-23 MED ORDER — CLOPIDOGREL BISULFATE 75 MG PO TABS
75.0000 mg | ORAL_TABLET | Freq: Every day | ORAL | 1 refills | Status: DC
Start: 2023-12-23 — End: 2024-08-03
  Filled 2023-12-23 – 2024-02-03 (×9): qty 90, 90d supply, fill #0
  Filled 2024-02-10: qty 30, 30d supply, fill #0
  Filled 2024-02-24 – 2024-03-16 (×3): qty 30, 30d supply, fill #1
  Filled 2024-04-09: qty 30, 30d supply, fill #2
  Filled 2024-05-05 – 2024-05-06 (×2): qty 30, 30d supply, fill #3
  Filled 2024-06-02: qty 30, 30d supply, fill #4
  Filled 2024-07-07: qty 30, 30d supply, fill #5
  Filled ????-??-??: fill #0

## 2023-12-24 ENCOUNTER — Other Ambulatory Visit (HOSPITAL_COMMUNITY): Payer: Self-pay

## 2023-12-24 DIAGNOSIS — M545 Low back pain, unspecified: Secondary | ICD-10-CM | POA: Diagnosis not present

## 2023-12-25 ENCOUNTER — Encounter: Payer: Self-pay | Admitting: Psychiatry

## 2023-12-25 ENCOUNTER — Other Ambulatory Visit: Payer: Self-pay | Admitting: Psychiatry

## 2023-12-25 ENCOUNTER — Telehealth: Payer: Self-pay | Admitting: Neurology

## 2023-12-25 DIAGNOSIS — R6889 Other general symptoms and signs: Secondary | ICD-10-CM

## 2023-12-25 DIAGNOSIS — M6281 Muscle weakness (generalized): Secondary | ICD-10-CM

## 2023-12-25 DIAGNOSIS — F314 Bipolar disorder, current episode depressed, severe, without psychotic features: Secondary | ICD-10-CM

## 2023-12-25 DIAGNOSIS — R251 Tremor, unspecified: Secondary | ICD-10-CM

## 2023-12-25 DIAGNOSIS — G251 Drug-induced tremor: Secondary | ICD-10-CM

## 2023-12-25 DIAGNOSIS — G4711 Idiopathic hypersomnia with long sleep time: Secondary | ICD-10-CM

## 2023-12-25 DIAGNOSIS — R419 Unspecified symptoms and signs involving cognitive functions and awareness: Secondary | ICD-10-CM

## 2023-12-25 NOTE — Telephone Encounter (Signed)
 Left vm on wife's number, pt's number was no answer and unable to leave message due to vm is full.

## 2023-12-25 NOTE — Telephone Encounter (Signed)
 I called the patient at 18.20  hours and he was not yet aware of the lithium warning.  I related Dr Cottle's message to him and he will get a lithium serum level in AM , will stop po Lithium for right now.   He signaled understanding.   Melvyn Novas, MD

## 2023-12-25 NOTE — Telephone Encounter (Signed)
 I had already documented in another phone note but looks like wife had been in contact already with Dr Jennelle Human and his recommendation is below:  Per Dr Jennelle Human: he could stop the lithium for 5-7 days ahead of the study and it would be out of his system.  he's on high dose duloxetine and it would be difficult to come off this but not imposssible but would take 2 weeks to stop and another week to clear his body.  of course he could get more depressed without them. the Leafy Kindle has a very long half life and would take weeks to get out of his system.  it is not practical nor useful to stop it as he is likely to have a prolonged period of suffering waiting for it to get out of his system. I agree he would need to be off the Sunosi and lorazepam before the study.  that should not be too difficult as he can abruptly stop them."

## 2023-12-25 NOTE — Telephone Encounter (Signed)
 Phone conversation with Mr Chandran and wife dr Sedalia Muta.   Postural instability, appears sedated, incoherent. Dr Sandria Bales with weaning off meds, lithium?  Patient  has trouble walking.

## 2023-12-26 ENCOUNTER — Other Ambulatory Visit: Payer: Self-pay | Admitting: Physician Assistant

## 2023-12-26 DIAGNOSIS — M545 Low back pain, unspecified: Secondary | ICD-10-CM | POA: Diagnosis not present

## 2023-12-26 DIAGNOSIS — R29818 Other symptoms and signs involving the nervous system: Secondary | ICD-10-CM

## 2023-12-27 ENCOUNTER — Other Ambulatory Visit (HOSPITAL_COMMUNITY): Payer: Self-pay

## 2023-12-27 LAB — LITHIUM LEVEL: Lithium Lvl: 0.8 mmol/L (ref 0.5–1.2)

## 2023-12-30 ENCOUNTER — Encounter: Payer: Self-pay | Admitting: Physician Assistant

## 2023-12-31 ENCOUNTER — Other Ambulatory Visit: Payer: Self-pay

## 2023-12-31 ENCOUNTER — Other Ambulatory Visit: Payer: Self-pay | Admitting: Legal Medicine

## 2023-12-31 DIAGNOSIS — M545 Low back pain, unspecified: Secondary | ICD-10-CM | POA: Diagnosis not present

## 2023-12-31 DIAGNOSIS — I2510 Atherosclerotic heart disease of native coronary artery without angina pectoris: Secondary | ICD-10-CM

## 2024-01-01 ENCOUNTER — Institutional Professional Consult (permissible substitution): Payer: 59 | Admitting: Psychology

## 2024-01-01 ENCOUNTER — Ambulatory Visit: Payer: Self-pay

## 2024-01-01 ENCOUNTER — Other Ambulatory Visit: Payer: Self-pay

## 2024-01-02 ENCOUNTER — Other Ambulatory Visit: Payer: Self-pay | Admitting: Physician Assistant

## 2024-01-02 ENCOUNTER — Other Ambulatory Visit (HOSPITAL_COMMUNITY): Payer: Self-pay

## 2024-01-02 DIAGNOSIS — M25552 Pain in left hip: Secondary | ICD-10-CM

## 2024-01-02 DIAGNOSIS — M545 Low back pain, unspecified: Secondary | ICD-10-CM | POA: Diagnosis not present

## 2024-01-03 ENCOUNTER — Other Ambulatory Visit: Payer: Self-pay

## 2024-01-03 ENCOUNTER — Ambulatory Visit (HOSPITAL_BASED_OUTPATIENT_CLINIC_OR_DEPARTMENT_OTHER)
Admission: RE | Admit: 2024-01-03 | Discharge: 2024-01-03 | Disposition: A | Discharge status: Home / Self Care | Source: Ambulatory Visit | Attending: Physician Assistant | Admitting: Physician Assistant

## 2024-01-03 DIAGNOSIS — M25552 Pain in left hip: Secondary | ICD-10-CM

## 2024-01-03 DIAGNOSIS — I878 Other specified disorders of veins: Secondary | ICD-10-CM | POA: Diagnosis not present

## 2024-01-07 DIAGNOSIS — M545 Low back pain, unspecified: Secondary | ICD-10-CM | POA: Diagnosis not present

## 2024-01-08 ENCOUNTER — Encounter: Payer: Self-pay | Admitting: Physician Assistant

## 2024-01-08 ENCOUNTER — Encounter: Payer: 59 | Admitting: Psychology

## 2024-01-10 ENCOUNTER — Other Ambulatory Visit: Payer: Self-pay | Admitting: Physician Assistant

## 2024-01-10 ENCOUNTER — Telehealth: Payer: Self-pay

## 2024-01-10 MED ORDER — PREDNISONE 20 MG PO TABS: ORAL_TABLET | ORAL | 0 refills | Status: DC

## 2024-01-10 NOTE — Telephone Encounter (Signed)
 Pt was sent the following MyChart Msg by Dr. Jennelle Human. He did not read it.   Given the severity of his neurocognitive sx we must rule out lithium toxicity.  He's on hydrochlorothiazide and that can cause abrupt increase in lithium levels.  please go tomorrow to get lithium level.  Sent to [facility] labs..  If they don't go right away we could miss this necessary information.   Also immediately stop lithium.   Meredith Staggers, MD, DFAPA   I called him to FU on this. He had lithium level done and has stopped the lithium.

## 2024-01-14 ENCOUNTER — Other Ambulatory Visit (HOSPITAL_COMMUNITY): Payer: Self-pay

## 2024-01-14 ENCOUNTER — Other Ambulatory Visit: Payer: Self-pay

## 2024-01-15 ENCOUNTER — Other Ambulatory Visit: Payer: Self-pay

## 2024-01-15 DIAGNOSIS — H5213 Myopia, bilateral: Secondary | ICD-10-CM | POA: Diagnosis not present

## 2024-01-17 ENCOUNTER — Other Ambulatory Visit: Payer: Self-pay

## 2024-01-20 ENCOUNTER — Other Ambulatory Visit (HOSPITAL_COMMUNITY): Payer: Self-pay

## 2024-01-20 ENCOUNTER — Other Ambulatory Visit: Payer: Self-pay

## 2024-01-20 ENCOUNTER — Telehealth: Payer: Self-pay | Admitting: *Deleted

## 2024-01-20 DIAGNOSIS — M5416 Radiculopathy, lumbar region: Secondary | ICD-10-CM | POA: Diagnosis not present

## 2024-01-20 NOTE — Telephone Encounter (Signed)
   Pre-operative Risk Assessment    Patient Name: Jon Gonzales  DOB: [birth year] MRN: [id number]   Date of last office visit: 08/29/23 DR. MADIREDDY Date of next office visit: NONE   Request for Surgical Clearance    Procedure:   LUMBAR MICRODISCECTOMY   Date of Surgery:  Clearance TBD                                Surgeon:  DR. Lisbeth Renshaw Surgeon's Group or Practice Name:  [facility] Phone number:  [phone] Fax number:  [phone] OR [phone]   Type of Clearance Requested:   - Medical  - Pharmacy:  Hold Aspirin and Clopidogrel (Plavix)     Type of Anesthesia:  General    Additional requests/questions:    [name]   01/20/2024, 6:12 PM

## 2024-01-21 NOTE — Telephone Encounter (Signed)
 I s/w the pt Dr. Sedalia Muta and he is agreeable to in office appt with Wallis Bamberg, NP for preop clearance in the [facility] office. Pt has also been added to wait list fro sooner appt. I will update all parties involved.

## 2024-01-21 NOTE — Telephone Encounter (Signed)
   Name: Jon Gonzales  DOB: 1960-11-15  MRN: [id number]  Primary Cardiologist: Marlyn Corporal Madireddy, MD  Chart reviewed as part of pre-operative protocol coverage. Because of [name]'s past medical history and time since last visit, he will require a follow-up in-office visit in order to better assess preoperative cardiovascular risk.  Pre-op covering staff: - Please schedule appointment and call patient to inform them. If patient already had an upcoming appointment within acceptable timeframe, please add "pre-op clearance" to the appointment notes so provider is aware. - Please contact requesting surgeon's office via preferred method (i.e, phone, fax) to inform them of need for appointment prior to surgery.  This message will also be routed to pharmacy pool and/or Dr Madireddy for input on holding ASA and Plavix as requested below so that this information is available to the clearing provider at time of patient's appointment.   Denyce Robert, NP  01/21/2024, 8:07 AM

## 2024-01-22 ENCOUNTER — Other Ambulatory Visit: Payer: Self-pay | Admitting: Neurology

## 2024-01-22 ENCOUNTER — Other Ambulatory Visit: Payer: Self-pay

## 2024-01-22 ENCOUNTER — Other Ambulatory Visit (HOSPITAL_BASED_OUTPATIENT_CLINIC_OR_DEPARTMENT_OTHER): Payer: Self-pay

## 2024-01-22 MED ORDER — SUNOSI 150 MG PO TABS
150.0000 mg | ORAL_TABLET | ORAL | 0 refills | Status: DC
  Filled 2024-01-22: qty 30, 30d supply, fill #0

## 2024-01-22 NOTE — Telephone Encounter (Signed)
 Pt's wife called in stating that the pt needed a refill for the patient Sunosi he takes.

## 2024-01-23 ENCOUNTER — Other Ambulatory Visit (HOSPITAL_BASED_OUTPATIENT_CLINIC_OR_DEPARTMENT_OTHER): Payer: Self-pay

## 2024-01-23 ENCOUNTER — Other Ambulatory Visit: Payer: Self-pay

## 2024-01-23 ENCOUNTER — Other Ambulatory Visit (HOSPITAL_COMMUNITY): Payer: Self-pay | Admitting: Neurosurgery

## 2024-01-23 DIAGNOSIS — I720 Aneurysm of carotid artery: Secondary | ICD-10-CM

## 2024-01-24 ENCOUNTER — Telehealth: Payer: Self-pay

## 2024-01-24 ENCOUNTER — Ambulatory Visit (HOSPITAL_COMMUNITY)
Admission: RE | Admit: 2024-01-24 | Discharge: 2024-01-24 | Disposition: A | Discharge status: Home / Self Care | Source: Ambulatory Visit | Attending: Neurosurgery | Admitting: Neurosurgery

## 2024-01-24 ENCOUNTER — Other Ambulatory Visit (HOSPITAL_COMMUNITY): Payer: Self-pay

## 2024-01-24 ENCOUNTER — Encounter: Payer: Self-pay | Admitting: Physician Assistant

## 2024-01-24 ENCOUNTER — Other Ambulatory Visit (HOSPITAL_BASED_OUTPATIENT_CLINIC_OR_DEPARTMENT_OTHER): Payer: Self-pay

## 2024-01-24 DIAGNOSIS — I720 Aneurysm of carotid artery: Secondary | ICD-10-CM | POA: Diagnosis not present

## 2024-01-24 MED ORDER — HYDROCHLOROTHIAZIDE 12.5 MG PO TABS
12.5000 mg | ORAL_TABLET | Freq: Every day | ORAL | 2 refills | Status: DC
Start: 2024-01-24 — End: 2024-03-16

## 2024-01-24 NOTE — Telephone Encounter (Signed)
 Pharmacy Patient Advocate Encounter   Received notification from Fax that prior authorization for Sunosi 150MG  tablets is required/requested.   Insurance verification completed.   The patient is insured through [facility] .   Per test claim: PA required; PA submitted to above mentioned insurance via CoverMyMeds Key/confirmation #/EOC [id number] Status is pending

## 2024-01-27 ENCOUNTER — Other Ambulatory Visit: Payer: Self-pay

## 2024-01-27 ENCOUNTER — Other Ambulatory Visit (HOSPITAL_BASED_OUTPATIENT_CLINIC_OR_DEPARTMENT_OTHER): Payer: Self-pay

## 2024-01-28 ENCOUNTER — Other Ambulatory Visit (HOSPITAL_COMMUNITY): Payer: Self-pay

## 2024-01-28 ENCOUNTER — Other Ambulatory Visit: Payer: Self-pay

## 2024-01-28 ENCOUNTER — Other Ambulatory Visit (HOSPITAL_BASED_OUTPATIENT_CLINIC_OR_DEPARTMENT_OTHER): Payer: Self-pay

## 2024-01-28 ENCOUNTER — Other Ambulatory Visit: Payer: Self-pay | Admitting: Physician Assistant

## 2024-01-28 DIAGNOSIS — F314 Bipolar disorder, current episode depressed, severe, without psychotic features: Secondary | ICD-10-CM

## 2024-01-28 MED ORDER — CARIPRAZINE HCL 3 MG PO CAPS
3.0000 mg | ORAL_CAPSULE | Freq: Every day | ORAL | 0 refills | Status: DC
  Filled 2024-01-28 – 2024-02-01 (×3): qty 90, 90d supply, fill #0
  Filled 2024-02-10: qty 30, 30d supply, fill #0
  Filled 2024-02-24: qty 30, 30d supply, fill #1
  Filled ????-??-??: fill #1
  Filled ????-??-??: fill #0

## 2024-01-29 ENCOUNTER — Other Ambulatory Visit (HOSPITAL_COMMUNITY): Payer: Self-pay

## 2024-01-29 ENCOUNTER — Ambulatory Visit: Admitting: Neurology

## 2024-01-29 ENCOUNTER — Encounter (HOSPITAL_BASED_OUTPATIENT_CLINIC_OR_DEPARTMENT_OTHER): Payer: Self-pay

## 2024-01-29 ENCOUNTER — Other Ambulatory Visit (HOSPITAL_BASED_OUTPATIENT_CLINIC_OR_DEPARTMENT_OTHER): Payer: Self-pay

## 2024-01-29 ENCOUNTER — Encounter: Payer: Self-pay | Admitting: Neurology

## 2024-01-29 ENCOUNTER — Other Ambulatory Visit: Payer: Self-pay | Admitting: Neurosurgery

## 2024-01-29 ENCOUNTER — Encounter (HOSPITAL_COMMUNITY): Payer: Self-pay

## 2024-01-29 DIAGNOSIS — R419 Unspecified symptoms and signs involving cognitive functions and awareness: Secondary | ICD-10-CM | POA: Diagnosis not present

## 2024-01-29 DIAGNOSIS — G251 Drug-induced tremor: Secondary | ICD-10-CM

## 2024-01-29 DIAGNOSIS — G4711 Idiopathic hypersomnia with long sleep time: Secondary | ICD-10-CM

## 2024-01-29 DIAGNOSIS — R6889 Other general symptoms and signs: Secondary | ICD-10-CM

## 2024-01-29 DIAGNOSIS — M6281 Muscle weakness (generalized): Secondary | ICD-10-CM

## 2024-01-29 DIAGNOSIS — R251 Tremor, unspecified: Secondary | ICD-10-CM

## 2024-01-29 NOTE — Procedures (Signed)
 Full Name: Jon Gonzales Gender: Male MRN #: [id number] Date of Birth: 02-Jan-1961    Visit Date: 01/29/2024 08:53 Age: 63 Years Examining Physician: Levert Feinstein Referring Physician: Levert Feinstein Height: 5 feet 10 inch History: 63 year old male with worsening low back pain radiating pain to left lower extremity.  Summary of the test: Nerve conduction study: Right sural, superficial peroneal sensory responses were normal.  Bilateral tibial, peroneal to EDB motor responses were normal.  Bilateral tibial H reflex were normal and symmetric  Electromyography: Selected needle examinations of bilateral lower extremity muscles and lumbosacral paraspinal muscles were normal  Conclusion: This is a normal study.  There is no electrodiagnostic evidence of large fiber peripheral neuropathy or bilateral lumbosacral radiculopathy.   Levert Feinstein. M.D. Ph.D. -- Physician Name, M.D.  [facility] [address] [place], [place] [address] Tel: [phone] Fax: [phone]  Verbal informed consent was obtained from the patient, patient was informed of potential risk of procedure, including bruising, bleeding, hematoma formation, infection, muscle weakness, muscle pain, numbness, among others.        MNC    Nerve / Sites Muscle Latency Ref. Amplitude Ref. Rel Amp Segments Distance Velocity Ref. Area    ms ms mV mV %  cm m/s m/s mVms  R Peroneal - EDB     Ankle EDB 5.5 <=6.5 5.8 >=2.0 100 Ankle - EDB 9   20.4     Fib head EDB 12.0  5.4  93.2 Fib head - Ankle 29 45 >=44 20.9     Pop fossa EDB 14.8  4.8  88.6 Pop fossa - Fib head 13 45 >=44 19.2         Pop fossa - Ankle      L Peroneal - EDB     Ankle EDB 4.5 <=6.5 4.1 >=2.0 100 Ankle - EDB 9   16.9     Fib head EDB 11.3  3.8  93 Fib head - Ankle 30 44 >=44 16.8     Pop fossa EDB 15.2  4.2  111 Pop fossa - Fib head 18 46 >=44 20.8         Pop fossa - Ankle      R Tibial - AH     Ankle AH 3.6 <=5.8 16.9 >=4.0  100 Ankle - AH 9   36.4     Pop fossa AH 14.5  9.6  56.9 Pop fossa - Ankle 50 46 >=41 28.6  L Tibial - AH     Ankle AH 4.1 <=5.8 20.3 >=4.0 100 Ankle - AH 9   40.2     Pop fossa AH 14.6  12.0  59.2 Pop fossa - Ankle 47 45 >=41 32.8             SNC    Nerve / Sites Rec. Site Peak Lat Ref.  Amp Ref. Segments Distance    ms ms V V  cm  R Sural - Ankle (Calf)     Calf Ankle 4.0 <=4.4 6 >=6 Calf - Ankle 14  L Sural - Ankle (Calf)     Calf Ankle 4.2 <=4.4 8 >=6 Calf - Ankle 14  R Superficial peroneal - Ankle     Lat leg Ankle 4.4 <=4.4 8 >=6 Lat leg - Ankle 14  L Superficial peroneal - Ankle     Lat leg Ankle 4.0 <=4.4 7 >=6 Lat leg - Ankle 14  F  Wave    Nerve F Lat Ref.   ms ms  R Tibial - AH 55.3 <=56.0  L Tibial - AH 56.8 <=56.0         H Reflex    Nerve Lat Hmax   ms   Both Ref.  Tibial - Soleus 34.3 <=35.0  Tibial - Soleus 32.9 <=35.0         EMG Summary Table    Spontaneous MUAP Recruitment  Muscle IA Fib PSW Fasc Other Amp Dur. Poly Pattern  L. Tibialis anterior Normal None None None _______ Normal Normal Normal Normal  L. Tibialis posterior Normal None None None _______ Normal Normal Normal Normal  L. Gastrocnemius (Medial head) Normal None None None _______ Normal Normal Normal Normal  L. Vastus lateralis Normal None None None _______ Normal Normal Normal Normal  R. Tibialis anterior Normal None None None _______ Normal Normal Normal Normal  R. Tibialis posterior Normal None None None _______ Normal Normal Normal Normal  R. Peroneus longus Normal None None None _______ Normal Normal Normal Normal  R. Gastrocnemius (Medial head) Normal None None None _______ Normal Normal Normal Normal  R. Vastus lateralis Normal None None None _______ Normal Normal Normal Normal  R. Adductor longus Normal None None None _______ Normal Normal Normal Normal  L. Adductor longus Normal None None None _______ Normal Normal Normal Normal  L. Lumbar paraspinals (low) Normal  None None None _______ Normal Normal Normal Normal  L. Lumbar paraspinals (mid) Normal None None None _______ Normal Normal Normal Normal  R. Lumbar paraspinals (low) Normal None None None _______ Normal Normal Normal Normal  R. Lumbar paraspinals (mid) Normal None None None _______ Normal Normal Normal Normal

## 2024-01-29 NOTE — Progress Notes (Signed)
 Chief Complaint  Patient presents with   NERVE CONDUCTION STUDY    Emg rm4,    EMG    Rm 4, having lower back surgery Monday      ASSESSMENT AND PLAN  Jon Gonzales is a 63 y.o. male   Chronic low back pain  MRI lumbar showed multilevel degenerative changes, most noticeable at L2-3, left subarticular and foraminal stenosis secondary to leftward disc protrusion in the moderate facet hypertrophy  EMG nerve conduction study January 29, 2024 are normal, there is no evidence of peripheral neuropathy or bilateral lumbosacral radiculopathy.  On schedule for decompression on February 03, 2024 due to moderate to severe intractable back pain  DIAGNOSTIC DATA (LABS, IMAGING, TESTING) - I reviewed patient records, labs, notes, testing and imaging myself where available.   MEDICAL HISTORY:  Jon Gonzales is a 63 year old male, seen in request by Dr. Vickey Huger for electrodiagnostic study for evaluation of low back pain    History is obtained from the patient and review of electronic medical records. I personally reviewed pertinent available imaging films in PACS.   PMHx of  HTN Anxiety HLD Bipolar disorder CAD  He has chronic low back pain, since 2024, has increased low back pain, radiating pain to left lower extremity, occasionally involvement to right lower extremity, getting worse bearing weight, denied persistent bilateral lower extremity motor or sensory deficit, denied bowel and bladder incontinence  He is already on schedule for lumbar decompression surgery by Dr. Conchita Paris on February 06, 2024  Reviewed MRI of the lumbar from December 2024, multilevel degenerative changes, leftward disc protrusion at L2-3, moderate facet hypertrophy, moderate left foraminal stenosis, rightward curvature at L2-3, there was no significant canal stenosis,  EMG nerve conduction study January 29, 2024 was essentially normal   PHYSICAL EXAM:   Vitals:   01/29/24 0827  BP: 120/82  Pulse: 84  SpO2: 94%   Weight: 179 lb (81.2 kg)  Height: 5\' 10"  (1.778 m)   Not recorded     Body mass index is 25.68 kg/m.  PHYSICAL EXAMNIATION:  Gen: NAD, conversant, well nourised, well groomed                     Cardiovascular: Regular rate rhythm, no peripheral edema, warm, nontender. Eyes: Conjunctivae clear without exudates or hemorrhage Neck: Supple, no carotid bruits. Pulmonary: Clear to auscultation bilaterally   NEUROLOGICAL EXAM:  MENTAL STATUS: Speech/cognition: Awake, alert, oriented to history taking and casual conversation CRANIAL NERVES: CN II: Visual fields are full to confrontation. Pupils are round equal and briskly reactive to light. CN III, IV, VI: extraocular movement are normal. No ptosis. CN V: Facial sensation is intact to light touch CN VII: Face is symmetric with normal eye closure  CN VIII: Hearing is normal to causal conversation. CN IX, X: Phonation is normal. CN XI: Head turning and shoulder shrug are intact  MOTOR: There is no pronator drift of out-stretched arms. Muscle bulk and tone are normal. Muscle strength is normal.  REFLEXES: Reflexes are 2+ and symmetric at the biceps, triceps, knees, and ankles. Plantar responses are flexor.  SENSORY: Intact to light touch, pinprick and vibratory sensation are intact in fingers and toes.  COORDINATION: There is no trunk or limb dysmetria noted.  GAIT/STANCE: Posture is normal. Gait is steady with normal steps, base, arm swing, and turning. Heel and toe walking are normal. Tandem gait is normal.  Romberg is absent.  REVIEW OF SYSTEMS:  Full 14 system review  of systems performed and notable only for as above All other review of systems were negative.   ALLERGIES: Allergies  Allergen Reactions   Trintellix [Vortioxetine] Other (See Comments)    Headaches.   Penicillins Rash    Did it involve swelling of the face/tongue/throat, SOB, or low BP? Unknown Did it involve sudden or severe rash/hives, skin  peeling, or any reaction on the inside of your mouth or nose? Unknown Did you need to seek medical attention at a hospital or doctor's office? Unknown When did it last happen? childhood reaction      If all above answers are "NO", may proceed with cephalosporin use.     HOME MEDICATIONS: Current Outpatient Medications  Medication Sig Dispense Refill   aspirin EC 81 MG tablet Take 1 tablet (81 mg total) by mouth daily. 90 tablet 2   cariprazine (VRAYLAR) 3 MG capsule Take 1 capsule (3 mg total) by mouth daily. 90 capsule 0   clopidogrel (PLAVIX) 75 MG tablet Take 1 tablet (75 mg total) by mouth daily. 90 tablet 1   DULoxetine (CYMBALTA) 60 MG capsule Take 1 capsule (60 mg total) by mouth 2 (two) times daily. 180 capsule 2   EPINEPHrine (EPIPEN 2-PAK) 0.3 mg/0.3 mL IJ SOAJ injection Inject 0.3 mg into the muscle as needed for anaphylaxis. 1 each 3   ezetimibe (ZETIA) 10 MG tablet Take 1 tablet (10 mg total) by mouth daily. 90 tablet 1   fexofenadine (ALLEGRA) 60 MG tablet Take 1 tablet (60 mg total) by mouth daily. (Patient taking differently: Take 60 mg by mouth at bedtime.) 90 tablet 2   hydrochlorothiazide (HYDRODIURIL) 12.5 MG tablet Take 1 tablet (12.5 mg total) by mouth daily. 90 tablet 2   isosorbide mononitrate (IMDUR) 60 MG 24 hr tablet Take 2 tablets (120 mg total) by mouth daily. 180 tablet 1   LORazepam (ATIVAN) 1 MG tablet Take 1 tablet (1 mg total) by mouth every 8 (eight) hours as needed for anxiety. 90 tablet 0   meclizine (ANTIVERT) 25 MG tablet Take 1 tablet (25 mg total) by mouth 3 (three) times daily as needed for dizziness. 90 tablet 2   metoprolol succinate (TOPROL-XL) 50 MG 24 hr tablet Take 1 tablet by mouth daily. Take with or immediately following a meal. 90 tablet 2   Multiple Vitamin (MULTIVITAMIN WITH MINERALS) TABS tablet Take 1 tablet by mouth daily. Men's Multivitamin     nitroGLYCERIN (NITROSTAT) 0.4 MG SL tablet Dissolve 1 tablet (0.4 mg total) under the tongue  every 5 (five) minutes up to 3 doses as needed for chest pain. If no relief after 3 doses call 911. 25 tablet 1   ondansetron (ZOFRAN) 4 MG tablet Take 1 tablet (4 mg total) by mouth every 8 (eight) hours as needed for nausea or vomiting. 20 tablet 0   pantoprazole (PROTONIX) 40 MG tablet Take 1 tablet (40 mg total) by mouth daily. 90 tablet 1   predniSONE (DELTASONE) 20 MG tablet 1 po tid for 3 days then 1 po bid for 3 days then 1 po qd for 3 days 18 tablet 0   ranolazine (RANEXA) 1000 MG SR tablet Take 1 tablet by mouth 2 times daily. 180 tablet 2   rosuvastatin (CRESTOR) 40 MG tablet Take 1 tablet (40 mg total) by mouth daily. 90 tablet 1   Semaglutide-Weight Management (WEGOVY) 2.4 MG/0.75ML SOAJ Inject 2 mg into the skin once a week. Saturday     Solriamfetol HCl (SUNOSI) 150 MG TABS  Take 1 tablet (150 mg total) by mouth every morning. 30 tablet 0   lithium carbonate (LITHOBID) 300 MG ER tablet Take 1 tablet (300 mg total) by mouth 2 (two) times daily. (Patient not taking: Reported on 01/29/2024) 180 tablet 1   No current facility-administered medications for this visit.    PAST MEDICAL HISTORY: Past Medical History:  Diagnosis Date   Abnormal EKG 06/13/2023   Abnormal stress test small area of mild ischemia inferior wall 05/24/2022   Aphasia 06/13/2023   Arthropathy of lumbar facet joint 07/03/2023   Bilateral sacroiliitis (HCC) 08/29/2023   Bipolar 1 disorder 02/04/2023   Chronic bilateral low back pain with right-sided sciatica 06/24/2023   Chronic headaches 01/30/2021   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   Coronary artery disease involving coronary bypass graft of native heart with angina pectoris 05/22/2016   Cath 2013 LAD 95% stenosis, D1 90% stenosis, RCA 20%, Circ 30%. Previous stent to D1 2012. EF 60%.   Degeneration of lumbar intervertebral disc 07/03/2023   Dizziness 10/27/2020    Excessive daytime sleepiness 09/11/2016   Gastroesophageal reflux disease 02/04/2023   Generalized anxiety disorder 05/22/2016   Herpes zoster 01/20/2020   History of small bowel obstruction    Admitted 10-14-2007 for partical small bowel obstruction and N.G. tube was placed. Admitted by Dr Dimas Chyle. 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 Small internal hemorrhoids. Otherwise normal colonoscopy to terminal ileum   CORONARY ARTERY BYPASS GRAFT  02/19/2012   L - LAD, SVG - D1, SVG - OM   CORONARY STENT INTERVENTION N/A 11/05/2019   Procedure: CORONARY STENT INTERVENTION;  Surgeon: Runell Gess, MD;  Location: [facility];  Service: Cardiovascular;  Laterality: N/A;  SVG to DIAG   ESOPHAGOGASTRODUODENOSCOPY  01/08/2007   Multiple duodenal ulcers with stenosis/stricture of the distal first portion of the duodenum (measuring approximantely 9mm) Mild gastritis.   LEFT HEART CATH AND CORS/GRAFTS ANGIOGRAPHY N/A 11/05/2019   Procedure: LEFT HEART CATH  AND CORS/GRAFTS ANGIOGRAPHY;  Surgeon: Runell Gess, MD;  Location: [facility];  Service: Cardiovascular;  Laterality: N/A;   LEFT HEART CATH AND CORS/GRAFTS ANGIOGRAPHY N/A 08/29/2023   Procedure: LEFT HEART CATH AND CORS/GRAFTS ANGIOGRAPHY;  Surgeon: Kathleene Hazel, MD;  Location: [facility];  Service: Cardiovascular;  Laterality: N/A;   RENAL ANGIOGRAPHY N/A 11/05/2019   Procedure: RENAL ANGIOGRAPHY;  Surgeon: Runell Gess, MD;  Location: [facility];  Service: Cardiovascular;  Laterality: N/A;  TONSILLECTOMY AND ADENOIDECTOMY     WRIST SURGERY      FAMILY HISTORY: Family History  Problem Relation Age of Onset   Aneurysm Mother 51       cerebral   CAD Father 4   Heart disease Sister        Unclear   Healthy Brother    Dementia Other        several individuals on extended paternal side of family    SOCIAL HISTORY: Social History   Socioeconomic History   Marital status: Married    Spouse name: Not on file   Number of children: Not on file   Years of education: 16   Highest education level: Bachelor's degree (e.g., BA, AB, BS)  Occupational History   Occupation: Oncologist: [employer]  Tobacco Use   Smoking status: Never   Smokeless tobacco: Never  Vaping Use   Vaping status: Never Used  Substance and Sexual Activity   Alcohol use: Yes    Alcohol/week: 14.0 standard drinks of alcohol    Types: 14 Cans of beer per week    Comment: ~ 2 beers nightly   Drug use: Never   Sexual activity: Not on file  Other Topics Concern   Not on file  Social History Narrative   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        Right Handed   Lives in two story home    Lives with wife   2 step children'   Social Drivers of Health   Financial Resource Strain: Low Risk  (06/04/2022)   Overall Financial Resource Strain (CARDIA)    Difficulty of Paying Living Expenses:  Not hard at all  Food Insecurity: No Food Insecurity (06/04/2022)   Hunger Vital Sign    Worried About Running Out of Food in the Last Year: Never true    Ran Out of Food in the Last Year: Never true  Transportation Needs: No Transportation Needs (06/04/2022)   PRAPARE - Administrator, Civil Service (Medical): No    Lack of Transportation (Non-Medical): No  Physical Activity: Inactive (06/04/2022)   Exercise Vital Sign    Days of Exercise per Week: 0 days    Minutes of Exercise per Session: 0 min  Stress: No Stress Concern Present (06/04/2022)   [employer] of Occupational Health - Occupational Stress Questionnaire    Feeling of Stress : Not at all  Social Connections: Moderately Integrated (06/04/2022)   Social Connection and Isolation Panel [NHANES]    Frequency of Communication with Friends and Family: More than three times a week    Frequency of Social Gatherings with Friends and Family: More than three times a week    Attends Religious Services: Never    Database administrator or Organizations: Yes    Attends Engineer, structural: More than 4 times per year    Marital Status: Married  Catering manager Violence: Not At Risk (06/04/2022)   Humiliation, Afraid, Rape, and Kick questionnaire    Fear of Current or Ex-Partner: No    Emotionally Abused: No    Physically Abused: No    Sexually Abused: No      Levert Feinstein, M.D. Ph.D.  [facility] [address], [address] [place], [place] [address] Ph: [phone] Fax: [phone]  CC:  [name], PA-C [address] [place],  [place] [address]  Cyndi Drain, PA-C

## 2024-01-29 NOTE — Telephone Encounter (Signed)
 Pharmacy Patient Advocate Encounter  Received notification from [facility] that Prior Authorization for Sunosi 150MG  tablets has been APPROVED from 01/29/2024 to 07/30/2024   PA #/Case ID/Reference #: [id number]

## 2024-01-30 ENCOUNTER — Other Ambulatory Visit: Payer: Self-pay

## 2024-01-30 ENCOUNTER — Other Ambulatory Visit (HOSPITAL_COMMUNITY): Payer: Self-pay

## 2024-01-30 ENCOUNTER — Other Ambulatory Visit (HOSPITAL_BASED_OUTPATIENT_CLINIC_OR_DEPARTMENT_OTHER): Payer: Self-pay

## 2024-01-30 NOTE — Progress Notes (Signed)
 Surgical Instructions   Your procedure is scheduled on  February 04, 2024 . Report to [facility] Main Entrance "A" at 11:15  A.M., then check in with the Admitting office. Any questions or running late day of surgery: call [phone]  Questions prior to your surgery date: call [phone], Monday-Friday, 8am-4pm. If you experience any cold or flu symptoms such as cough, fever, chills, shortness of breath, etc. between now and your scheduled surgery, please notify us at the above number.     Remember:  Do not eat after midnight the night before your surgery  You may drink clear liquids until 10:15  the morning of your surgery.   Clear liquids allowed are: Water, Non-Citrus Juices (without pulp), Carbonated Beverages, Clear Tea (no milk, honey, etc.), Black Coffee Only (NO MILK, CREAM OR POWDERED CREAMER of any kind), and Gatorade.    Take these medicines the morning of surgery with A SIP OF WATER  cariprazine (VRAYLAR)  DULoxetine (CYMBALTA)  ezetimibe (ZETIA)  isosorbide mononitrate (IMDUR)  metoprolol succinate (TOPROL-XL)  pantoprazole (PROTONIX)  ranolazine (RANEXA)  predniSONE (DELTASONE)  rosuvastatin (CRESTOR)   May take these medicines IF NEEDED:  EPIPEN 2-PAK  LORazepam (ATIVAN)  meclizine (ANTIVERT)  ondansetron (ZOFRAN)  nitroGLYCERIN (NITROSTAT) PLEASE CALL [phone] if medication is used before surgery     aspirin LAST DOSE: clopidogrel (PLAVIX) LAST DOSE: Semaglutide-Weight Management (WEGOVY) LAST DOSE   One week prior to surgery, STOP taking any Aspirin (unless otherwise instructed by your surgeon) Aleve, Naproxen, Ibuprofen, Motrin, Advil, Goody's, BC's, all herbal medications, fish oil, and non-prescription vitamins.                     Do NOT Smoke (Tobacco/Vaping) for 24 hours prior to your procedure.  If you use a CPAP at night, you may bring your mask/headgear for your overnight stay.   You will be asked to remove any contacts, glasses,  piercing's, hearing aid's, dentures/partials prior to surgery. Please bring cases for these items if needed.    Patients discharged the day of surgery will not be allowed to drive home, and someone needs to stay with them for 24 hours.  SURGICAL WAITING ROOM VISITATION Patients may have no more than 2 support people in the waiting area - these visitors may rotate.   Pre-op nurse will coordinate an appropriate time for 1 ADULT support person, who may not rotate, to accompany patient in pre-op.  Children under the age of 27 must have an adult with them who is not the patient and must remain in the main waiting area with an adult.  If the patient needs to stay at the hospital during part of their recovery, the visitor guidelines for inpatient rooms apply.  Please refer to the [facility] [place] [facility] website for the visitor guidelines for any additional information.   If you received a COVID test during your pre-op visit  it is requested that you wear a mask when out in public, stay away from anyone that may not be feeling well and notify your surgeon if you develop symptoms. If you have been in contact with anyone that has tested positive in the last 10 days please notify you surgeon.      Pre-operative 5 CHG Bathing Instructions   You can play a key role in reducing the risk of infection after surgery. Your skin needs to be as free of germs as possible. You can reduce the number of germs on your skin by washing with CHG (chlorhexidine  gluconate) soap before surgery. CHG is an antiseptic soap that kills germs and continues to kill germs even after washing.   DO NOT use if you have an allergy to chlorhexidine/CHG or antibacterial soaps. If your skin becomes reddened or irritated, stop using the CHG and notify one of our RNs at [phone].   Please shower with the CHG soap starting 4 days before surgery using the following schedule:     Please keep in mind the following:  DO NOT shave, including legs  and underarms, starting the day of your first shower.   You may shave your face at any point before/day of surgery.  Place clean sheets on your bed the day you start using CHG soap. Use a clean washcloth (not used since being washed) for each shower. DO NOT sleep with pets once you start using the CHG.   CHG Shower Instructions:  Wash your face and private area with normal soap. If you choose to wash your hair, wash first with your normal shampoo.  After you use shampoo/soap, rinse your hair and body thoroughly to remove shampoo/soap residue.  Turn the water OFF and apply about 3 tablespoons (45 ml) of CHG soap to a CLEAN washcloth.  Apply CHG soap ONLY FROM YOUR NECK DOWN TO YOUR TOES (washing for 3-5 minutes)  DO NOT use CHG soap on face, private areas, open wounds, or sores.  Pay special attention to the area where your surgery is being performed.  If you are having back surgery, having someone wash your back for you may be helpful. Wait 2 minutes after CHG soap is applied, then you may rinse off the CHG soap.  Pat dry with a clean towel  Put on clean clothes/pajamas   If you choose to wear lotion, please use ONLY the CHG-compatible lotions that are listed below.  Additional instructions for the day of surgery: DO NOT APPLY any lotions, deodorants, cologne, or perfumes.   Do not bring valuables to the hospital. [facility] is not responsible for any belongings/valuables. Do not wear nail polish, gel polish, artificial nails, or any other type of covering on natural nails (fingers and toes) Do not wear jewelry or makeup Put on clean/comfortable clothes.  Please brush your teeth.  Ask your nurse before applying any prescription medications to the skin.     CHG Compatible Lotions   Aveeno Moisturizing lotion  Cetaphil Moisturizing Cream  Cetaphil Moisturizing Lotion  Clairol Herbal Essence Moisturizing Lotion, Dry Skin  Clairol Herbal Essence Moisturizing Lotion, Extra Dry Skin   Clairol Herbal Essence Moisturizing Lotion, Normal Skin  Curel Age Defying Therapeutic Moisturizing Lotion with Alpha Hydroxy  Curel Extreme Care Body Lotion  Curel Soothing Hands Moisturizing Hand Lotion  Curel Therapeutic Moisturizing Cream, Fragrance-Free  Curel Therapeutic Moisturizing Lotion, Fragrance-Free  Curel Therapeutic Moisturizing Lotion, Original Formula  Eucerin Daily Replenishing Lotion  Eucerin Dry Skin Therapy Plus Alpha Hydroxy Crme  Eucerin Dry Skin Therapy Plus Alpha Hydroxy Lotion  Eucerin Original Crme  Eucerin Original Lotion  Eucerin Plus Crme Eucerin Plus Lotion  Eucerin TriLipid Replenishing Lotion  Keri Anti-Bacterial Hand Lotion  Keri Deep Conditioning Original Lotion Dry Skin Formula Softly Scented  Keri Deep Conditioning Original Lotion, Fragrance Free Sensitive Skin Formula  Keri Lotion Fast Absorbing Fragrance Free Sensitive Skin Formula  Keri Lotion Fast Absorbing Softly Scented Dry Skin Formula  Keri Original Lotion  Keri Skin Renewal Lotion Keri Silky Smooth Lotion  Keri Silky Smooth Sensitive Skin Lotion  Nivea Body Creamy Conditioning  Oil  Nivea Body Extra Enriched Teacher, adult education Moisturizing Lotion Nivea Crme  Nivea Skin Firming Lotion  NutraDerm 30 Skin Lotion  NutraDerm Skin Lotion  NutraDerm Therapeutic Skin Cream  NutraDerm Therapeutic Skin Lotion  ProShield Protective Hand Cream  Provon moisturizing lotion  Please read over the following fact sheets that you were given.

## 2024-01-30 NOTE — Progress Notes (Deleted)
 Surgical Instructions   Your procedure is scheduled on February 04, 2024. Report to [facility] Main Entrance "A" at 11:15 A.M., then check in with the Admitting office. Any questions or running late day of surgery: call [phone]  Questions prior to your surgery date: call [phone], Monday-Friday, 8am-4pm. If you experience any cold or flu symptoms such as cough, fever, chills, shortness of breath, etc. between now and your scheduled surgery, please notify us at the above number.     Remember:  Do not eat after midnight the night before your surgery   You may drink clear liquids until 10:15 the morning of your surgery.   Clear liquids allowed are: Water, Non-Citrus Juices (without pulp), Carbonated Beverages, Clear Tea (no milk, honey, etc.), Black Coffee Only (NO MILK, CREAM OR POWDERED CREAMER of any kind), and Gatorade.    Take these medicines the morning of surgery with A SIP OF WATER  cariprazine (VRAYLAR)  DULoxetine (CYMBALTA)  ezetimibe (ZETIA)  isosorbide mononitrate (IMDUR)  metoprolol succinate (TOPROL-XL)  pantoprazole (PROTONIX)  ranolazine (RANEXA)  predniSONE (DELTASONE)  rosuvastatin (CRESTOR)   May take these medicines IF NEEDED: EPIPEN 2-PAK  LORazepam (ATIVAN)  meclizine (ANTIVERT)  ondansetron (ZOFRAN)  nitroGLYCERIN (NITROSTAT) PLEASE CALL [phone] if medication is used before surgery   aspirin LAST DOSE: clopidogrel (PLAVIX) LAST DOSE: Semaglutide-Weight Management (WEGOVY) LAST DOSE:   One week prior to surgery, STOP taking any Aspirin (unless otherwise instructed by your surgeon) Aleve, Naproxen, Ibuprofen, Motrin, Advil, Goody's, BC's, all herbal medications, fish oil, and non-prescription vitamins.                     Do NOT Smoke (Tobacco/Vaping) for 24 hours prior to your procedure.  If you use a CPAP at night, you may bring your mask/headgear for your overnight stay.   You will be asked to remove any contacts, glasses, piercing's,  hearing aid's, dentures/partials prior to surgery. Please bring cases for these items if needed.    Patients discharged the day of surgery will not be allowed to drive home, and someone needs to stay with them for 24 hours.  SURGICAL WAITING ROOM VISITATION Patients may have no more than 2 support people in the waiting area - these visitors may rotate.   Pre-op nurse will coordinate an appropriate time for 1 ADULT support person, who may not rotate, to accompany patient in pre-op.  Children under the age of 56 must have an adult with them who is not the patient and must remain in the main waiting area with an adult.  If the patient needs to stay at the hospital during part of their recovery, the visitor guidelines for inpatient rooms apply.  Please refer to the [facility] website for the visitor guidelines for any additional information.   If you received a COVID test during your pre-op visit  it is requested that you wear a mask when out in public, stay away from anyone that may not be feeling well and notify your surgeon if you develop symptoms. If you have been in contact with anyone that has tested positive in the last 10 days please notify you surgeon.      Pre-operative CHG Bathing Instructions   You can play a key role in reducing the risk of infection after surgery. Your skin needs to be as free of germs as possible. You can reduce the number of germs on your skin by washing with CHG (chlorhexidine gluconate) soap before surgery. CHG is an  antiseptic soap that kills germs and continues to kill germs even after washing.   DO NOT use if you have an allergy to chlorhexidine/CHG or antibacterial soaps. If your skin becomes reddened or irritated, stop using the CHG and notify one of our RNs at [phone].              TAKE A SHOWER THE NIGHT BEFORE SURGERY AND THE DAY OF SURGERY    Please keep in mind the following:  DO NOT shave, including legs and underarms, 48 hours prior to  surgery.   You may shave your face before/day of surgery.  Place clean sheets on your bed the night before surgery Use a clean washcloth (not used since being washed) for each shower. DO NOT sleep with pet's night before surgery.  CHG Shower Instructions:  Wash your face and private area with normal soap. If you choose to wash your hair, wash first with your normal shampoo.  After you use shampoo/soap, rinse your hair and body thoroughly to remove shampoo/soap residue.  Turn the water OFF and apply half the bottle of CHG soap to a CLEAN washcloth.  Apply CHG soap ONLY FROM YOUR NECK DOWN TO YOUR TOES (washing for 3-5 minutes)  DO NOT use CHG soap on face, private areas, open wounds, or sores.  Pay special attention to the area where your surgery is being performed.  If you are having back surgery, having someone wash your back for you may be helpful. Wait 2 minutes after CHG soap is applied, then you may rinse off the CHG soap.  Pat dry with a clean towel  Put on clean pajamas    Additional instructions for the day of surgery: DO NOT APPLY any lotions, deodorants, cologne, or perfumes.   Do not wear jewelry or makeup Do not wear nail polish, gel polish, artificial nails, or any other type of covering on natural nails (fingers and toes) Do not bring valuables to the hospital. [facility] is not responsible for valuables/personal belongings. Put on clean/comfortable clothes.  Please brush your teeth.  Ask your nurse before applying any prescription medications to the skin.

## 2024-01-31 ENCOUNTER — Other Ambulatory Visit: Payer: Self-pay

## 2024-01-31 ENCOUNTER — Other Ambulatory Visit (HOSPITAL_BASED_OUTPATIENT_CLINIC_OR_DEPARTMENT_OTHER): Payer: Self-pay

## 2024-02-01 ENCOUNTER — Other Ambulatory Visit (HOSPITAL_COMMUNITY): Payer: Self-pay

## 2024-02-01 ENCOUNTER — Other Ambulatory Visit (HOSPITAL_BASED_OUTPATIENT_CLINIC_OR_DEPARTMENT_OTHER): Payer: Self-pay

## 2024-02-03 ENCOUNTER — Encounter (HOSPITAL_COMMUNITY): Payer: Self-pay

## 2024-02-03 ENCOUNTER — Other Ambulatory Visit: Payer: Self-pay

## 2024-02-03 ENCOUNTER — Other Ambulatory Visit (HOSPITAL_BASED_OUTPATIENT_CLINIC_OR_DEPARTMENT_OTHER): Payer: Self-pay

## 2024-02-03 ENCOUNTER — Encounter: Payer: Self-pay | Admitting: Neurology

## 2024-02-03 ENCOUNTER — Encounter (HOSPITAL_COMMUNITY)
Admission: RE | Admit: 2024-02-03 | Discharge: 2024-02-03 | Disposition: A | Discharge status: Home / Self Care | Source: Ambulatory Visit | Attending: Neurosurgery | Admitting: Neurosurgery

## 2024-02-03 VITALS — BP 126/91 | HR 75 | Temp 97.9°F | Resp 18 | Ht 70.0 in | Wt 174.8 lb

## 2024-02-03 DIAGNOSIS — Z951 Presence of aortocoronary bypass graft: Secondary | ICD-10-CM | POA: Insufficient documentation

## 2024-02-03 DIAGNOSIS — F319 Bipolar disorder, unspecified: Secondary | ICD-10-CM | POA: Insufficient documentation

## 2024-02-03 DIAGNOSIS — Z955 Presence of coronary angioplasty implant and graft: Secondary | ICD-10-CM | POA: Insufficient documentation

## 2024-02-03 DIAGNOSIS — E7800 Pure hypercholesterolemia, unspecified: Secondary | ICD-10-CM | POA: Diagnosis not present

## 2024-02-03 DIAGNOSIS — G459 Transient cerebral ischemic attack, unspecified: Secondary | ICD-10-CM | POA: Insufficient documentation

## 2024-02-03 DIAGNOSIS — I10 Essential (primary) hypertension: Secondary | ICD-10-CM | POA: Insufficient documentation

## 2024-02-03 DIAGNOSIS — Z01812 Encounter for preprocedural laboratory examination: Secondary | ICD-10-CM | POA: Diagnosis present

## 2024-02-03 DIAGNOSIS — Z7982 Long term (current) use of aspirin: Secondary | ICD-10-CM | POA: Diagnosis not present

## 2024-02-03 DIAGNOSIS — M5416 Radiculopathy, lumbar region: Secondary | ICD-10-CM | POA: Diagnosis not present

## 2024-02-03 DIAGNOSIS — I2510 Atherosclerotic heart disease of native coronary artery without angina pectoris: Secondary | ICD-10-CM | POA: Diagnosis not present

## 2024-02-03 DIAGNOSIS — Z01818 Encounter for other preprocedural examination: Secondary | ICD-10-CM

## 2024-02-03 LAB — SURGICAL PCR SCREEN
MRSA, PCR: NEGATIVE
Staphylococcus aureus: NEGATIVE

## 2024-02-03 LAB — BASIC METABOLIC PANEL WITH GFR
Anion gap: 11 (ref 5–15)
BUN: 13 mg/dL (ref 8–23)
CO2: 26 mmol/L (ref 22–32)
Calcium: 9.4 mg/dL (ref 8.9–10.3)
Chloride: 101 mmol/L (ref 98–111)
Creatinine, Ser: 1.03 mg/dL (ref 0.61–1.24)
GFR, Estimated: >60 mL/min (ref >60)
Glucose, Bld: 114 mg/dL — ABNORMAL HIGH (ref 70–99)
Potassium: 4.1 mmol/L (ref 3.5–5.1)
Sodium: 138 mmol/L (ref 135–145)

## 2024-02-03 LAB — CBC
HCT: 41.7 % (ref 39.0–52.0)
Hemoglobin: 14.1 g/dL (ref 13.0–17.0)
MCH: 34.1 pg — ABNORMAL HIGH (ref 26.0–34.0)
MCHC: 33.8 g/dL (ref 30.0–36.0)
MCV: 101 fL — ABNORMAL HIGH (ref 80.0–100.0)
Platelets: 122 10*3/uL — ABNORMAL LOW (ref 150–400)
RBC: 4.13 MIL/uL — ABNORMAL LOW (ref 4.22–5.81)
RDW: 12 % (ref 11.5–15.5)
WBC: 4 10*3/uL (ref 4.0–10.5)
nRBC: 0 % (ref 0.0–0.2)

## 2024-02-03 NOTE — Anesthesia Preprocedure Evaluation (Addendum)
 Anesthesia Evaluation  Patient identified by MRN, date of birth, ID band Patient awake    Reviewed: Allergy & Precautions, NPO status , Patient's Chart, lab work & pertinent test results  History of Anesthesia Complications Negative for: history of anesthetic complications  Airway Mallampati: II  TM Distance: >3 FB Neck ROM: Full    Dental  (+) Dental Advisory Given   Pulmonary sleep apnea (does not require CPAP)    breath sounds clear to auscultation       Cardiovascular hypertension, Pt. on medications (-) angina + CAD, + Cardiac Stents and + CABG   Rhythm:Regular Rate:Normal  '22 ECHO: E 60-65%,  1. LV has normal function, no regional wall motion abnormalities, diastolic parameters normal 2. RVF normal, normal Pulm arterial pressure 3. Mild MR 4. No AI, no AS  08/2023 Cath: LAD: severe stenosis with patent LIMA graft Cx: severe stenosis, with patent vein graft to OM RCA:  chronically occluded with L to R collateral filling   Neuro/Psych  Headaches  Anxiety Depression Bipolar Disorder   narcolepsy TIA   GI/Hepatic Neg liver ROS, PUD,GERD  Medicated and Controlled,,  Endo/Other  semaglutide   Renal/GU Renal InsufficiencyRenal disease     Musculoskeletal   Abdominal   Peds  Hematology Plavix  Hb 14.1, plt 122k   Anesthesia Other Findings   Reproductive/Obstetrics                              Anesthesia Physical Anesthesia Plan  ASA: 4  Anesthesia Plan: General   Post-op Pain Management: Tylenol  PO (pre-op)*   Induction: Intravenous  PONV Risk Score and Plan: 2 and Ondansetron  and Dexamethasone   Airway Management Planned: Oral ETT  Additional Equipment: ClearSight  Intra-op Plan:   Post-operative Plan: Extubation in OR  Informed Consent: I have reviewed the patients History and Physical, chart, labs and discussed the procedure including the risks, benefits and  alternatives for the proposed anesthesia with the patient or authorized representative who has indicated his/her understanding and acceptance.     Dental advisory given  Plan Discussed with: CRNA and Surgeon  Anesthesia Plan Comments: (See PAT note written 02/13/2024 by Allison Zelenak, PA-C.   Cardiac cath 08/29/23:   Ost LAD to Prox LAD lesion is 80% stenosed.   Mid LAD lesion is 80% stenosed.   1st Diag lesion is 90% stenosed.   Prox RCA to Mid RCA lesion is 100% stenosed.   Prox Cx lesion is 80% stenosed.   Previously placed Origin to Prox Graft stent of unknown type is  widely patent.   Non-stenotic Mid Cx lesion was previously treated.   LIMA graft was visualized by angiography.   SVG graft was visualized by angiography.   SVG graft was visualized by angiography.   The left ventricular systolic function is normal.   LV end diastolic pressure is normal.   The left ventricular ejection fraction is 50-55% by visual estimate.   1. Severe stenosis proximal and mid LAD. Patent LIMA graft to the mid LAD. Severe stenosis in the ostium of the small to moderate caliber Diagonal branch. Patent vein graft to the Diagonal branch with patent stent in the ostium of the Diagonal branch. The stent appears to extend into the aorta.  2. Severe stenosis in the mid Circumflex artery. Patent vein graft to the obtuse marginal branch.  3. Large dominant RCA with chronic occlusion of the proximal vessel. The distal vessel and branches fill from left  to right collaterals.  4. Normal LV systolic function. LVEDP 11 mmHg.    Recommendations: The RCA occlusion is new since his cardiac cath in 2021. This vessel has brisk collateral filling. His CAD is otherwise stable. No targets for PCI. Continue medical management of CAD. He will resume his Ranexa  as his angina started when he stopped his Ranexa  this week. Discharge home tonight after bedrest.  )        Anesthesia Quick Evaluation

## 2024-02-03 NOTE — Progress Notes (Signed)
 Anesthesia Chart Review:  Case: [id number] Date/Time: 02/04/24 1451   Procedure: LUMBAR LAMINECTOMY/DECOMPRESSION MICRODISCECTOMY 1 LEVEL (Left) - MICRODISCECTOMY, LT, L23   Anesthesia type: General   Diagnosis: Radiculopathy, lumbar region [M54.16]   Pre-op diagnosis: RADICULOPATHY, LUMBAR REGION   [facility] ROOM 20 / [facility] OR   Surgeons: Augusto Blonder, MD       DISCUSSION: Patient is a 63 year old male scheduled for the above procedure.   History includes never smoker, HTN, hypercholesterolemia, CAD (CABG 2013: LIMA-LAD, SVG-D1, SVG-OM; prior mCX stent; DES D1 11/05/19; new CTO RCA with distal filling from left-to-right collaterals, no good PCI targets, continue medical therapy 08/29/23), TIA (2021), partial SBO (10/14/07, non-operative management), GERD, Bipolar 1 disorder, tremor/drug induced secondary parkinsonism. 14 standard alcohol beverages/week documented.   He has seen several cardiologists with [facility] in [place]. His wife is Dr. Mercy Stall from [facility]. In review of cardiology notes dating back to June 2024, He had a stress test in June 2023 for chest pain evaluation. There was a small defect with mild reversibility consistent with ischemia, EF 72%. Nitroglycerin  use was infrequent, bo decision made to maximize medical therapy. Troponins negative x 2. On  08/29/23, he had follow-up with Dr. Ronell Coe for more frequent chest pain. He had also misses some doses of Ranexa . Decision made to pursue LHC which was done on 08/29/23 showing patent LIMA-LAD, patient vein graft to the DIAG with patent stent in the ostium of the DIAG branch, severe mid CX stenosis with patent vein graft to OM branch, large dominant RCA with CTO of the proximal vessel with distal vessel and branching filling from left-to-right collaterals. The RCA occlusion was new since 2021 but with brisk collateral filling and otherwise stable CAD. No targets for PCI, so continued medical management  recommended. Advised to resume Ranexa .   Dr. Nat Badger did request preoperative cardiology risk assessment. Initial review with Palmer Bobo, NP with plans at that time for in office visit, but in the interim, also reached out to Dr. Ronell Coe for input regarding perioperative ASA and Plavix . Per Dr. Ronell Coe on 01/21/24, ""He does have history of coronary artery disease and s/p CABG and last cath showed no significant change in anatomy and no revascularization done.  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  However I would recommend continuing aspirin  81 mg uninterrupted given the risk for perioperative MI.  If continuing aspirin  is considered prohibitive from the procedural standpoint, risk of withholding aspirin  should be discussed closely with the surgeon performing the procedure and discussed the risk benefits and proceed accordingly." He has not had office follow-up visit. Reportedly his wife Dr. Reinhold Carbine had indicated to Dr. Cleven Dallas office they he had received cardiology "clearance." Discussed with anesthesiologist Jerrell Mora, MD and attempted to call Mr. Duggar but call went to voicemail. Would advise written cardiology input regarding risk assessment, especially since he is still requiring at least occasional Nitroglycerin  (reported use ~ one week ago per PAT RN). Sherian Dimitri at Dr. Cleven Dallas office to follow-up. I also let her know that Dr. Ronell Coe had advised continuing ASA 81 mg and if prohibitive advised discussion with surgeon regarding risks/benefits. Reportedly Plavix  is on hold, and currently still on ASA. Of note, surgery is still pending insurance approval.      VS: BP (!) 126/91   Pulse 75   Temp 36.6 C (Oral)   Resp 18   Ht 5\' 10"  (1.778 m)   Wt 79.3 kg  SpO2 100%   BMI 25.08 kg/m    PROVIDERS: Cyndi Drain, PA-C is PCP  Madireddy, Tereasa Felty, MD is last cardiologist who evaluated him at [facility]. Phebe Brasil, MD and  Dohmeier, Raoul Byes, MD are his neurologists. Last visit 01/29/24 with Dr. Gracie Lav. She is aware of surgery plans. He saw Dr. Albertina Hugger on 11/07/23 following ED visit for memory changes, recent ED visit for possible seizure. Had negative EEG and negative code stroke evaluation. Lithium  levels were normal. His cognitive changes felt "probably related to medication and/ or depression,( pseudo dementia) , he has normal MRIs and Negative ATN testing." Spells "not epileptiform and stroke work up was negative.Aaron AasAaron AasThinking of cataplexy/ narcolepsy". Consider Strattera . Follow-up in 4 months planned.   LABS: Labs reviewed: Acceptable for surgery. (all labs ordered are listed, but only abnormal results are displayed)  Labs Reviewed  BASIC METABOLIC PANEL WITH GFR - Abnormal; Notable for the following components:      Result Value   Glucose, Bld 114 (*)    All other components within normal limits  CBC - Abnormal; Notable for the following components:   RBC 4.13 (*)    MCV 101.0 (*)    MCH 34.1 (*)    Platelets 122 (*)    All other components within normal limits  SURGICAL PCR SCREEN    OTHER: EEG 10/07/23: IMPRESSION: This study is within normal limits. No seizures or epileptiform discharges were seen throughout the recording. A normal interictal EEG does not exclude the diagnosis of epilepsy.   IMAGES: MRA Neck 01/24/24: In process.  MRI Brain 10/03/23: IMPRESSION: Unremarkable appearance of the brain for age.  CXR 12/02/23: FINDINGS: The heart size and mediastinal contours are within normal limits. Prior CABG again noted. Both lungs are clear. The visualized skeletal structures are unremarkable. IMPRESSION: No active cardiopulmonary disease.  CTA Head/Neck 10/03/23: IMPRESSION: 1. No intracranial large vessel occlusion or significant stenosis. 2. No hemodynamically significant stenosis in the neck. 3. 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  MRI L-spine 08/10/23: IMPRESSION: 1. Moderate left subarticular and foraminal stenosis at L2-3 secondary to a leftward disc protrusion and moderate facet hypertrophy on the left. 2. Focal rightward curvature at L2-3 with asymmetric type 1 Modic changes on the left at L2-3 suggesting more recent changes. 3. Mild right foraminal narrowing at L2-3. 4. Mild left foraminal narrowing at L3-4. 5. Mild foraminal narrowing bilaterally at L4-5 is worse on the left. 6. Shallow right paramedian disc protrusion at L5-S1 potentially contacts the traversing right S1 nerve roots.     EKG: 10/03/23: Sinus rhythm Prominent P waves, nondiagnostic Incomplete right bundle branch block Confirmed by Rueben Cote [phone]) on 10/03/2023 2:06:00 PM    CV: Cardiac cath 08/29/23:   Estella Helling LAD to Prox LAD lesion is 80% stenosed.   Mid LAD lesion is 80% stenosed.   1st Diag lesion is 90% stenosed.   Prox RCA to Mid RCA lesion is 100% stenosed.   Prox Cx lesion is 80% stenosed.   Previously placed Origin to Prox Graft stent of unknown type is  widely patent.   Non-stenotic Mid Cx lesion was previously treated.   LIMA graft was visualized by angiography.   SVG graft was visualized by angiography.   SVG graft was visualized by angiography.   The left ventricular systolic function is normal.   LV end diastolic pressure is normal.   The left ventricular ejection fraction is 50-55% by visual estimate.  Severe stenosis proximal and mid LAD. Patent LIMA graft to the mid LAD. Severe stenosis in the ostium of the small to moderate caliber Diagonal branch. Patent vein graft to the Diagonal branch with patent stent in the ostium of the Diagonal branch. The stent appears to extend into the aorta.  Severe stenosis in the mid Circumflex artery. Patent vein graft to the obtuse marginal branch.  Large dominant RCA with chronic occlusion of the proximal vessel. The distal vessel and branches fill from  left to right collaterals.  Normal LV systolic function. LVEDP 11 mmHg.    Recommendations: The RCA occlusion is new since his cardiac cath in 2021. This vessel has brisk collateral filling. His CAD is otherwise stable. No targets for PCI. Continue medical management of CAD. He will resume his Ranexa  as his angina started when he stopped his Ranexa  this week. Discharge home tonight after bedrest.    Nuclear stress test 03/22/22:   Findings are consistent with ischemia. The study is low risk.   LV perfusion is abnormal. Defect 1: There is a small defect with mild reduction in uptake present in the inferior location(s) that is reversible. There is normal wall motion in the defect area. Consistent with ischemia.   Left ventricular function is normal. Nuclear stress EF: 72 %. The left ventricular ejection fraction is hyperdynamic (>65%). End diastolic cavity size is normal.   Prior study not available for comparison.    12 day Long Term Monitor 11/09/20:  Indication: Dizziness - The minimum heart rate was 48 bpm, maximum heart rate was 108 bpm, and average heart rate was 68 bpm. Predominant underlying rhythm was Sinus Rhythm. - Premature atrial complexes were rare less than 1%. Premature Ventricular complexes were rare less than 1%. - No no ventricular tachycardia, no pauses, No AV block, no supraventricular tachycardia and no atrial fibrillation present. No patient triggered events or diary events reported.  - Conclusion: Normal/unremarkable study   Echo 12/06/20: IMPRESSIONS   1. GLS: -16.5. Left ventricular ejection fraction, by estimation, is 60  to 65%. The left ventricle has normal function. The left ventricle has no  regional wall motion abnormalities. Left ventricular diastolic parameters  were normal.   2. Right ventricular systolic function is normal. The right ventricular  size is normal. There is normal pulmonary artery systolic pressure.   3. The mitral valve is normal in  structure. Mild mitral valve  regurgitation. No evidence of mitral stenosis.   4. The aortic valve is normal in structure. Aortic valve regurgitation is  not visualized. No aortic stenosis is present.   5. The inferior vena cava is normal in size with greater than 50%  respiratory variability, suggesting right atrial pressure of 3 mmHg.    Past Medical History:  Diagnosis Date   Abnormal EKG 06/13/2023   Abnormal stress test small area of mild ischemia inferior wall 05/24/2022   Aphasia 06/13/2023   Arthropathy of lumbar facet joint 07/03/2023   Bilateral sacroiliitis (HCC) 08/29/2023   Bipolar 1 disorder 02/04/2023   Chronic bilateral low back pain with right-sided sciatica 06/24/2023   Chronic headaches 01/30/2021   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   Coronary artery disease involving coronary bypass graft of native heart with angina pectoris 05/22/2016   Cath 2013 LAD 95% stenosis, D1 90% stenosis, RCA 20%, Circ 30%. Previous stent to D1 2012. EF 60%.   Degeneration of lumbar intervertebral disc 07/03/2023  Dizziness 10/27/2020   Excessive daytime sleepiness 09/11/2016   Gastroesophageal reflux disease 02/04/2023   Generalized anxiety disorder 05/22/2016   Herpes zoster 01/20/2020   History of small bowel obstruction    Admitted 10-14-2007 for partical small bowel obstruction and N.G. tube was placed. Admitted by Dr Maple Seltzer. 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   Nephrolithiasis    Neurological abnormality 06/13/2023    Obstructive sleep apnea 09/11/2016   Other drug induced secondary parkinsonism 01/03/2017   Other fatigue 06/13/2023   Paronychia of finger, left 08/22/2020   PLMD (periodic limb movement disorder) 09/11/2016   PUD (peptic ulcer disease)    Syncope 06/13/2023   TIA (transient ischemic attack)    Tinnitus of both ears 06/26/2022   Tremor 02/04/2023    Past Surgical History:  Procedure Laterality Date   CHOLECYSTECTOMY     COLONOSCOPY  09/10/2012   Mild sigmoid diverticulosis. Small internal hemorrhoids. Otherwise normal colonoscopy to terminal ileum   CORONARY ARTERY BYPASS GRAFT  02/19/2012   L - LAD, SVG - D1, SVG - OM   CORONARY STENT INTERVENTION N/A 11/05/2019   Procedure: CORONARY STENT INTERVENTION;  Surgeon: Avanell Leigh, MD;  Location: [facility] INVASIVE CV LAB;  Service: Cardiovascular;  Laterality: N/A;  SVG to DIAG   ESOPHAGOGASTRODUODENOSCOPY  01/08/2007   Multiple duodenal ulcers with stenosis/stricture of the distal first portion of the duodenum (measuring approximantely 9mm) Mild gastritis.   LEFT HEART CATH AND CORS/GRAFTS ANGIOGRAPHY N/A 11/05/2019   Procedure: LEFT HEART CATH AND CORS/GRAFTS ANGIOGRAPHY;  Surgeon: Avanell Leigh, MD;  Location: [facility]VAS[facility] CV LAB;  Service: Cardiovascular;  Laterality: N/A;   LEFT HEART CATH AND CORS/GRAFTS ANGIOGRAPHY N/A 08/29/2023   Procedure: LEFT HEART CATH AND CORS/GRAFTS ANGIOGRAPHY;  Surgeon: Odie Benne, MD;  Location: [facility] INVASIVE CV LAB;  Service: Cardiovascular;  Laterality: N/A;   RENAL ANGIOGRAPHY N/A 11/05/2019   Procedure: RENAL ANGIOGRAPHY;  Surgeon: Avanell Leigh, MD;  Location: [facility]AS[facility] CV LAB;  Service: Cardiovascular;  Laterality: N/A;   TONSILLECTOMY AND ADENOIDECTOMY     WRIST SURGERY Right     MEDICATIONS:  acetaminophen  (TYLENOL ) 500 MG tablet   aspirin  EC 81 MG tablet   cariprazine  (VRAYLAR ) 3 MG capsule   clopidogrel  (PLAVIX ) 75 MG tablet   DULoxetine  (CYMBALTA ) 60 MG capsule    ezetimibe  (ZETIA ) 10 MG tablet   fexofenadine  (ALLEGRA ) 60 MG tablet   hydrochlorothiazide  (HYDRODIURIL ) 12.5 MG tablet   isosorbide  mononitrate (IMDUR ) 60 MG 24 hr tablet   lithium  carbonate (LITHOBID ) 300 MG ER tablet   LORazepam  (ATIVAN ) 1 MG tablet   meclizine  (ANTIVERT ) 25 MG tablet   Multiple Vitamin (MULTIVITAMIN WITH MINERALS) TABS tablet   nitroGLYCERIN  (NITROSTAT ) 0.4 MG SL tablet   ondansetron  (ZOFRAN ) 4 MG tablet   pantoprazole  (PROTONIX ) 40 MG tablet   ranolazine  (RANEXA ) 1000 MG SR tablet   rosuvastatin  (CRESTOR ) 40 MG tablet   Semaglutide -Weight Management (WEGOVY ) 2.4 MG/0.75ML SOAJ   Solriamfetol  HCl (SUNOSI ) 150 MG TABS   No current facility-administered medications for this encounter.    Ella Gun, PA-C Surgical Short Stay/Anesthesiology [facility] Phone [phone] [facility] Phone [phone] 02/03/2024  3:53 PM

## 2024-02-03 NOTE — Progress Notes (Signed)
 PCP - Cyndi Drain PA-C Cardiologist - Tereasa Felty Madireddy,MD  PPM/ICD - denies Device Orders -  Rep Notified -   Chest x-ray - 12/02/23 EKG - 02/03/24 Stress Test - 03/22/22 ECHO - 12/06/20 Cardiac Cath - 12/06/19  Sleep Study - 10/16/2016 CPAP - pt stated he has mild sleep apnea and he does not use a CPAP  Fasting Blood Sugar - na Checks Blood Sugar _____ times a day  Last dose of GLP1 agonist- pt states his last dose of Wegovy  was taken 01/18/24. GLP1 instructions: Hold Wegovy  7 days prior to surgery.   Blood Thinner Instructions: Pt states he was instructed to hold his Plavix  one week before surgery. States he took his last dose 01/27/24. Aspirin  Instructions:Pt stated he was told to continue his baby ASA. He took it this am 02/03/24. Will hold DOS 02/04/24.  ERAS Protcol -no  PRE-SURGERY Ensure or G2-   COVID TEST- na   Anesthesia review: yes - cardiac clearance- has not received it;reports using nitroglycerin  one week ago for chest pain.Notified Madison Fountain,NP in [facility] office.  Patient denies shortness of breath, fever, cough and chest pain at PAT appointment   All instructions explained to the patient, with a verbal understanding of the material. Patient agrees to go over the instructions while at home for a better understanding. Patient also instructed to wear a mask when out in public. The opportunity to ask questions was provided.

## 2024-02-03 NOTE — Progress Notes (Addendum)
 Surgical Instructions   Your procedure is scheduled on  February 04, 2024 . Report to [facility] Main Entrance "A" at 1:00PM, then check in with the Admitting office. Any questions or running late day of surgery: call [phone]  Questions prior to your surgery date: call [phone], Monday-Friday, 8am-4pm. If you experience any cold or flu symptoms such as cough, fever, chills, shortness of breath, etc. between now and your scheduled surgery, please notify us  at the above number.     Remember:  Do not eat after midnight the night before your surgery  You may drink clear liquids until 12:00pm  the dayof your surgery.   Clear liquids allowed are: Water, Non-Citrus Juices (without pulp), Carbonated Beverages, Clear Tea (no milk, honey, etc.), Black Coffee Only (NO MILK, CREAM OR POWDERED CREAMER of any kind), and Gatorade.    Take these medicines the morning of surgery with A SIP OF WATER  cariprazine  (VRAYLAR )  DULoxetine  (CYMBALTA )  ezetimibe  (ZETIA )  isosorbide  mononitrate (IMDUR )  metoprolol  succinate (TOPROL -XL)  pantoprazole  (PROTONIX )  ranolazine  (RANEXA )  predniSONE  (DELTASONE )  rosuvastatin  (CRESTOR )   May take these medicines IF NEEDED:  EPIPEN  2-PAK  LORazepam  (ATIVAN )  meclizine  (ANTIVERT )  ondansetron  (ZOFRAN )  nitroGLYCERIN  (NITROSTAT ) PLEASE CALL [phone] if medication is used before surgery     One week prior to surgery, STOP taking any Aspirin   Aleve, Naproxen, Ibuprofen, Motrin, Advil, Goody's, BC's, all herbal medications, fish oil, and non-prescription vitamins.                     Do NOT Smoke (Tobacco/Vaping) for 24 hours prior to your procedure.  If you use a CPAP at night, you may bring your mask/headgear for your overnight stay.   You will be asked to remove any contacts, glasses, piercing's, hearing aid's, dentures/partials prior to surgery. Please bring cases for these items if needed.    Patients discharged the day of surgery will not be  allowed to drive home, and someone needs to stay with them for 24 hours.  SURGICAL WAITING ROOM VISITATION Patients may have no more than 2 support people in the waiting area - these visitors may rotate.   Pre-op nurse will coordinate an appropriate time for 1 ADULT support person, who may not rotate, to accompany patient in pre-op.  Children under the age of 2 must have an adult with them who is not the patient and must remain in the main waiting area with an adult.  If the patient needs to stay at the hospital during part of their recovery, the visitor guidelines for inpatient rooms apply.  Please refer to the [facility] website for the visitor guidelines for any additional information.   If you received a COVID test during your pre-op visit  it is requested that you wear a mask when out in public, stay away from anyone that may not be feeling well and notify your surgeon if you develop symptoms. If you have been in contact with anyone that has tested positive in the last 10 days please notify you surgeon.      Pre-operative 5 CHG Bathing Instructions   You can play a key role in reducing the risk of infection after surgery. Your skin needs to be as free of germs as possible. You can reduce the number of germs on your skin by washing with CHG (chlorhexidine gluconate) soap before surgery. CHG is an antiseptic soap that kills germs and continues to kill germs even after washing.  DO NOT use if you have an allergy to chlorhexidine/CHG or antibacterial soaps. If your skin becomes reddened or irritated, stop using the CHG and notify one of our RNs at [phone].   Please shower with the CHG soap starting 4 days before surgery using the following schedule:     Please keep in mind the following:  DO NOT shave, including legs and underarms, starting the day of your first shower.   You may shave your face at any point before/day of surgery.  Place clean sheets on your bed the day you  start using CHG soap. Use a clean washcloth (not used since being washed) for each shower. DO NOT sleep with pets once you start using the CHG.   CHG Shower Instructions:  Wash your face and private area with normal soap. If you choose to wash your hair, wash first with your normal shampoo.  After you use shampoo/soap, rinse your hair and body thoroughly to remove shampoo/soap residue.  Turn the water OFF and apply about 3 tablespoons (45 ml) of CHG soap to a CLEAN washcloth.  Apply CHG soap ONLY FROM YOUR NECK DOWN TO YOUR TOES (washing for 3-5 minutes)  DO NOT use CHG soap on face, private areas, open wounds, or sores.  Pay special attention to the area where your surgery is being performed.  If you are having back surgery, having someone wash your back for you may be helpful. Wait 2 minutes after CHG soap is applied, then you may rinse off the CHG soap.  Pat dry with a clean towel  Put on clean clothes/pajamas   If you choose to wear lotion, please use ONLY the CHG-compatible lotions that are listed below.  Additional instructions for the day of surgery: DO NOT APPLY any lotions, deodorants, cologne, or perfumes.   Do not bring valuables to the hospital. [facility] is not responsible for any belongings/valuables. Do not wear nail polish, gel polish, artificial nails, or any other type of covering on natural nails (fingers and toes) Do not wear jewelry or makeup Put on clean/comfortable clothes.  Please brush your teeth.  Ask your nurse before applying any prescription medications to the skin.     CHG Compatible Lotions   Aveeno Moisturizing lotion  Cetaphil Moisturizing Cream  Cetaphil Moisturizing Lotion  Clairol Herbal Essence Moisturizing Lotion, Dry Skin  Clairol Herbal Essence Moisturizing Lotion, Extra Dry Skin  Clairol Herbal Essence Moisturizing Lotion, Normal Skin  Curel Age Defying Therapeutic Moisturizing Lotion with Alpha Hydroxy  Curel Extreme Care Body Lotion   Curel Soothing Hands Moisturizing Hand Lotion  Curel Therapeutic Moisturizing Cream, Fragrance-Free  Curel Therapeutic Moisturizing Lotion, Fragrance-Free  Curel Therapeutic Moisturizing Lotion, Original Formula  Eucerin Daily Replenishing Lotion  Eucerin Dry Skin Therapy Plus Alpha Hydroxy Crme  Eucerin Dry Skin Therapy Plus Alpha Hydroxy Lotion  Eucerin Original Crme  Eucerin Original Lotion  Eucerin Plus Crme Eucerin Plus Lotion  Eucerin TriLipid Replenishing Lotion  Keri Anti-Bacterial Hand Lotion  Keri Deep Conditioning Original Lotion Dry Skin Formula Softly Scented  Keri Deep Conditioning Original Lotion, Fragrance Free Sensitive Skin Formula  Keri Lotion Fast Absorbing Fragrance Free Sensitive Skin Formula  Keri Lotion Fast Absorbing Softly Scented Dry Skin Formula  Keri Original Lotion  Keri Skin Renewal Lotion Keri Silky Smooth Lotion  Keri Silky Smooth Sensitive Skin Lotion  Nivea Body Creamy Conditioning Oil  Nivea Body Extra Enriched Teacher, adult education Moisturizing Lotion Nivea Crme  Nivea  Skin Firming Lotion  NutraDerm 30 Skin Lotion  NutraDerm Skin Lotion  NutraDerm Therapeutic Skin Cream  NutraDerm Therapeutic Skin Lotion  ProShield Protective Hand Cream  Provon moisturizing lotion  Please read over the following fact sheets that you were given.

## 2024-02-04 ENCOUNTER — Other Ambulatory Visit (HOSPITAL_BASED_OUTPATIENT_CLINIC_OR_DEPARTMENT_OTHER): Payer: Self-pay

## 2024-02-04 MED ORDER — TRAMADOL HCL 50 MG PO TABS
50.0000 mg | ORAL_TABLET | Freq: Four times a day (QID) | ORAL | 0 refills | Status: DC | PRN
  Filled 2024-02-04: qty 20, 5d supply, fill #0

## 2024-02-05 NOTE — Telephone Encounter (Signed)
LVM for pt to call about results. °

## 2024-02-05 NOTE — Telephone Encounter (Signed)
 Has returned call to [place], [place]

## 2024-02-05 NOTE — Telephone Encounter (Signed)
 Took call from phone room and spoke w/ pt about results. He verbalized understanding.

## 2024-02-06 ENCOUNTER — Encounter: Payer: Self-pay | Admitting: Neurology

## 2024-02-06 ENCOUNTER — Ambulatory Visit: Admitting: Cardiology

## 2024-02-10 ENCOUNTER — Other Ambulatory Visit: Payer: Self-pay

## 2024-02-10 ENCOUNTER — Other Ambulatory Visit (HOSPITAL_COMMUNITY): Payer: Self-pay

## 2024-02-11 ENCOUNTER — Other Ambulatory Visit: Payer: Self-pay

## 2024-02-12 ENCOUNTER — Other Ambulatory Visit: Payer: Self-pay

## 2024-02-13 NOTE — H&P (Signed)
 Chief Complaint   Left back and leg pain  History of Present Illness  Jon Gonzales is a 63 year old man I am seeing in follow-up.  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  Initially felt to be more SI joint related, he did undergo diagnostic SI joint injection which was negative.  He then did complain of left-sided posterolateral buttock pain with some radiation into the anterior thigh.  He therefore did also undergo a epidural steroid injection.  Unfortunately this injection only provided about a day or so of relief.  He was seen again reporting similar left-sided buttock and thigh pain, which at times is 10/10 in severity.  We have therefore elected to proceed with surgical decompression.  Past Medical History   Past Medical History:  Diagnosis Date   Abnormal EKG 06/13/2023   Abnormal stress test small area of mild ischemia inferior wall 05/24/2022   Aphasia 06/13/2023   Arthropathy of lumbar facet joint 07/03/2023   Bilateral sacroiliitis (HCC) 08/29/2023   Bipolar 1 disorder 02/04/2023   Chronic bilateral low back pain with right-sided sciatica 06/24/2023   Chronic headaches 01/30/2021   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   Coronary artery disease involving coronary bypass graft of native heart with angina pectoris 05/22/2016   Cath 2013 LAD 95% stenosis, D1 90% stenosis, RCA 20%, Circ 30%. Previous stent to D1 2012. EF 60%.   Degeneration of lumbar intervertebral disc 07/03/2023   Dizziness 10/27/2020   Excessive daytime sleepiness 09/11/2016   Gastroesophageal reflux disease 02/04/2023   Generalized anxiety disorder 05/22/2016   Herpes zoster 01/20/2020   History of small bowel obstruction     Admitted 10-14-2007 for partical small bowel obstruction and N.G. tube was placed. Admitted by Dr Maple Seltzer. 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 Small internal hemorrhoids. Otherwise normal colonoscopy to terminal ileum   CORONARY ARTERY BYPASS GRAFT  02/19/2012   L - LAD, SVG - D1, SVG - OM   CORONARY STENT INTERVENTION N/A 11/05/2019   Procedure: CORONARY STENT INTERVENTION;  Surgeon: Avanell Leigh, MD;  Location: [facility] INVAS[facility];  Service: Cardiovascular;  Laterality: N/A;  SVG to DIAG   ESOPHAGOGASTRODUODENOSCOPY  01/08/2007   Multiple duodenal ulcers with stenosis/stricture of the distal first portion of the duodenum (measuring approximantely 9mm) Mild gastritis.   LEFT HEART  CATH AND CORS/GRAFTS ANGIOGRAPHY N/A 11/05/2019   Procedure: LEFT HEART CATH AND CORS/GRAFTS ANGIOGRAPHY;  Surgeon: Avanell Leigh, MD;  Location: [facility]VAS[facility] CV LAB;  Service: Cardiovascular;  Laterality: N/A;   LEFT HEART CATH AND CORS/GRAFTS ANGIOGRAPHY N/A  08/29/2023   Procedure: LEFT HEART CATH AND CORS/GRAFTS ANGIOGRAPHY;  Surgeon: Odie Benne, MD;  Location: [facility]VAS[facility] CV LAB;  Service: Cardiovascular;  Laterality: N/A;   RENAL ANGIOGRAPHY N/A 11/05/2019   Procedure: RENAL ANGIOGRAPHY;  Surgeon: Avanell Leigh, MD;  Location: [facility];  Service: Cardiovascular;  Laterality: N/A;   TONSILLECTOMY AND ADENOIDECTOMY     WRIST SURGERY Right     Social History   Social History   Tobacco Use   Smoking status: Never   Smokeless tobacco: Never  Vaping Use   Vaping status: Never Used  Substance Use Topics   Alcohol use: Yes    Alcohol/week: 14.0 standard drinks of alcohol    Types: 14 Cans of beer per week    Comment: ~ 2 beers nightly   Drug use: Never    Medications   Prior to Admission medications   Medication Sig Start Date End Date Taking? Authorizing Provider  acetaminophen  (TYLENOL ) 500 MG tablet Take 500-1,000 mg by mouth every 6 (six) hours as needed for moderate pain (pain score 4-6).   Yes [provider]  aspirin  EC 81 MG tablet Take 1 tablet (81 mg total) by mouth daily. 08/29/23  Yes Johnie Nailer B, NP  cariprazine  (VRAYLAR ) 3 MG capsule Take 1 capsule (3 mg total) by mouth daily. 01/28/24  Yes Cyndi Drain, PA-C  clopidogrel  (PLAVIX ) 75 MG tablet Take 1 tablet (75 mg total) by mouth daily. 12/23/23  Yes Cyndi Drain, PA-C  DULoxetine  (CYMBALTA ) 60 MG capsule Take 1 capsule (60 mg total) by mouth 2 (two) times daily. 07/29/23  Yes Cottle, Kennedy Peabody., MD  ezetimibe  (ZETIA ) 10 MG tablet Take 1 tablet (10 mg total) by mouth daily. 11/14/23  Yes Cyndi Drain, PA-C  fexofenadine  (ALLEGRA ) 60 MG tablet Take 1 tablet (60 mg total) by mouth daily. 10/03/22  Yes Audie Bleacher, MD  hydrochlorothiazide  (HYDRODIURIL ) 12.5 MG tablet Take 1 tablet (12.5 mg total) by mouth daily. 01/24/24  Yes Craft, Mirian Ames, PA  isosorbide  mononitrate (IMDUR ) 60 MG 24 hr tablet Take 2 tablets (120 mg total) by mouth  daily. 12/06/23  Yes Cyndi Drain, PA-C  LORazepam  (ATIVAN ) 1 MG tablet Take 1 tablet (1 mg total) by mouth every 8 (eight) hours as needed for anxiety. 11/29/23  Yes White, Mora Apple, NP  meclizine  (ANTIVERT ) 25 MG tablet Take 1 tablet (25 mg total) by mouth 3 (three) times daily as needed for dizziness. 10/03/22  Yes Audie Bleacher, MD  Multiple Vitamin (MULTIVITAMIN WITH MINERALS) TABS tablet Take 1 tablet by mouth daily. Men's Multivitamin   Yes [provider]  nitroGLYCERIN  (NITROSTAT ) 0.4 MG SL tablet Dissolve 1 tablet (0.4 mg total) under the tongue every 5 (five) minutes up to 3 doses as needed for chest pain. If no relief after 3 doses call 911. 09/04/23  Yes Cyndi Drain, PA-C  ondansetron  (ZOFRAN ) 4 MG tablet Take 1 tablet (4 mg total) by mouth every 8 (eight) hours as needed for nausea or vomiting. 10/13/22  Yes Allegra Arch, NP  pantoprazole  (PROTONIX ) 40 MG tablet Take 1 tablet (40 mg total) by mouth daily. 12/23/23  Yes Cyndi Drain, PA-C  ranolazine  (RANEXA ) 1000 MG SR tablet Take 1 tablet  by mouth 2 times daily. 08/29/23  Yes Johnie Nailer B, NP  rosuvastatin  (CRESTOR ) 40 MG tablet Take 1 tablet (40 mg total) by mouth daily. Patient taking differently: Take 40 mg by mouth 2 (two) times daily. 12/23/23  Yes Cyndi Drain, PA-C  Semaglutide -Weight Management (WEGOVY ) 2.4 MG/0.75ML SOAJ Inject 2 mg into the skin once a week. Saturday   Yes [provider]  Solriamfetol  HCl (SUNOSI ) 150 MG TABS Take 1 tablet (150 mg total) by mouth every morning. 01/22/24  Yes Athar, Saima, MD  lithium  carbonate (LITHOBID ) 300 MG ER tablet Take 1 tablet (300 mg total) by mouth 2 (two) times daily. Patient not taking: Reported on 01/29/2024 09/23/23   Cyndi Drain, PA-C  traMADol  (ULTRAM ) 50 MG tablet Take 1 tablet (50 mg total) by mouth every 6 (six) hours as needed. 02/04/24     diltiazem  (CARDIZEM  SR) 60 MG 12 hr capsule Take 1 capsule (60 mg total) by mouth 2 (two) times daily.  05/02/20 11/07/20  Audie Bleacher, MD  pantoprazole  (PROTONIX ) 40 MG tablet Take 1 tablet (40 mg total) by mouth daily. 01/17/22   Allegra Arch, NP  triamterene -hydrochlorothiazide  (DYAZIDE) 37.5-25 MG capsule Take 1 each (1 capsule total) by mouth daily. 08/08/20 10/25/20  Audie Bleacher, MD    Allergies   Allergies  Allergen Reactions   Trintellix [Vortioxetine] Other (See Comments)    Headaches.   Penicillins Rash    Review of Systems  ROS  Neurologic Exam  Awake, alert, oriented Memory and concentration grossly intact Speech fluent, appropriate CN grossly intact Motor exam: Upper Extremities Deltoid Bicep Tricep Grip  Right 5/5 5/5 5/5 5/5  Left 5/5 5/5 5/5 5/5   Lower Extremities IP Quad PF DF EHL  Right 5/5 5/5 5/5 5/5 5/5  Left 5/5 5/5 5/5 5/5 5/5   Sensation grossly intact to LT  Imaging  MRI of the lumbar spine also personally reviewed.  This demonstrates primary finding at L2-3 where there is a left-sided subarticular/lateral recess disc bulge with associated lateral recess and foraminal stenosis.  I do not see any other high-grade central or lateral recess/neural foraminal stenosis in the lumbar spine.  Impression  - 63 y.o. male with left-sided back and leg pain likely related to L2-3 disc herniation.  He has not had meaningful improvement with conservative treatment including epidural steroid injection.  Plan  - Will proceed with left L2-3 laminotomy and microdiscectomy  I have reviewed the indications for the procedure as well as the details of the procedure and the expected postoperative course and recovery at length with the patient in the office. We have also reviewed in detail the risks, benefits, and alternatives to the procedure. All questions were answered and Jon Gonzales provided informed consent to proceed.  Augusto Blonder, MD [facility]

## 2024-02-13 NOTE — Progress Notes (Signed)
 Anesthesia Chart Review: SAME DAY WORK-UP  Case: [id number] Date/Time: 02/14/24 0715   Procedure: LUMBAR LAMINECTOMY/DECOMPRESSION MICRODISCECTOMY 1 LEVEL (Left) - MICRODISCECTOMY, LT, L23   Anesthesia type: General   Diagnosis: Radiculopathy, lumbar region [M54.16]   Pre-op diagnosis: RADICULOPATHY, LUMBAR REGION   [facility]: [facility]OM [facility] / [facility]   Surgeons: Augusto Blonder, MD       DISCUSSION: Patient is a 63 year old male scheduled for the above procedure. Surgery was initially scheduled for 02/04/24, but postponed while awaiting insurance approval.   History includes never smoker, HTN, hypercholesterolemia, CAD (CABG 2013: LIMA-LAD, SVG-D1, SVG-OM; prior mCX stent; DES D1 11/05/19; new CTO RCA with distal filling from left-to-right collaterals, no good PCI targets, continue medical therapy 08/29/23), TIA (2021), partial SBO (10/14/07, non-operative management), GERD, Bipolar 1 disorder, tremor/drug induced secondary parkinsonism. 14 standard alcohol beverages/week documented.    He has seen several cardiologists with [facility] in [place]. His wife is Dr. Mercy Stall from [place]. In review of cardiology notes dating back to June 2024, He had a stress test in June 2023 for chest pain evaluation. There was a small defect with mild reversibility consistent with ischemia, EF 72%. Nitroglycerin  use was infrequent, so decision made to maximize medical therapy. Troponins negative x 2. On  08/29/23, he had follow-up with Dr. Ronell Coe for more frequent chest pain. He had also missed some doses of Ranexa . Decision made to pursue LHC which was done on 08/29/23 showing patent LIMA-LAD, patient vein graft to the DIAG with patent stent in the ostium of the DIAG branch, severe mid CX stenosis with patent vein graft to OM branch, large dominant RCA with CTO of the proximal vessel with distal vessel and branching filling from left-to-right collaterals. The RCA occlusion was new since 2021 but  with brisk collateral filling and otherwise stable CAD. No targets for PCI, so continued medical management recommended. Advised to resume Ranexa .   Dr. Nat Badger did request preoperative cardiology input. Per initial review by Palmer Bobo, NP, the plan was for office visit, but in the interim, he also reached out for Dr. Ronell Coe for input regarding perioperative ASA and Plavix . Per Dr. Ronell Coe, "He does have history of coronary artery disease and s/p CABG and last cath showed no significant change in anatomy and no revascularization done.  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  However I would recommend continuing aspirin  81 mg uninterrupted given the risk for perioperative MI.  If continuing aspirin  is considered prohibitive from the procedural standpoint, risk of withholding aspirin  should be discussed closely with the surgeon performing the procedure and discussed the risk benefits and proceed accordingly." He still has to take an occasional Nitroglycerin , so initially anesthesiologist had advised continue with APP evaluation (see previous note signed on 02/03/24); However APP visit was canceled as Mr. Hoon and/or Dr. Reinhold Carbine were under the impression that Dr. Madireddy's input was "clearance" for surgery.  I got voice mail when I attempted to call Mr. Olt, but reportedly he and his wife Dr. Reinhold Carbine insistent his symptoms have been stable.   I reviewed available information with anesthesiologist Adora Aland, MD. Mr. Tamburro had recent LHC in 08/2023 for chest pain and was found to have new CTO RCA with distal filling from left-to-right collaterals, no good PCI targets, and otherwise stable anatomy. Continued medical therapy recommended. He previously reported Plavix  on hold for surgery, but continuing ASA. PAT staff are still trying to reach him  to update history and instructions. If new findings, will plan to update, otherwise anesthesia team will further assess on  the day of surgery. Definitive plan at that time. Of note, per communication with neurology on 02/06/24, Dr. Reinhold Carbine says that she recently weaned Mr. Pacis off duloxetine  and Lithium . At previous PAT visit on 02/03/24, he was advised to hold Wegovy  for 7 days prior to surgery.    VS:  Wt Readings from Last 3 Encounters:  02/03/24 79.3 kg  01/29/24 81.2 kg  12/02/23 74.4 kg   BP Readings from Last 3 Encounters:  02/03/24 (!) 126/91  01/29/24 120/82  12/02/23 112/62   Pulse Readings from Last 3 Encounters:  02/03/24 75  01/29/24 84  12/02/23 74     PROVIDERS:  Cyndi Drain, PA-C is PCP  Madireddy, Tereasa Felty, MD is last cardiologist who evaluated him at [facility]. Phebe Brasil, MD and Dohmeier, Raoul Byes, MD are his neurologists. Last visit 01/29/24 with Dr. Gracie Lav. She is aware of surgery plans. He saw Dr. Albertina Hugger on 11/07/23 following ED visit for memory changes, recent ED visit for possible seizure. Had negative EEG and negative code stroke evaluation. Lithium  levels were normal. His cognitive changes felt "probably related to medication and/ or depression,( pseudo dementia) , he has normal MRIs and Negative ATN testing." Spells "not epileptiform and stroke work up was negative.Aaron AasAaron AasThinking of cataplexy/ narcolepsy". Consider Strattera . Follow-up in 4 months planned.    LABS: Most recent lab results in [facility] include: Lab Results  Component Value Date   WBC 4.0 02/03/2024   HGB 14.1 02/03/2024   HCT 41.7 02/03/2024   PLT 122 (L) 02/03/2024   GLUCOSE 114 (H) 02/03/2024   CHOL 131 11/26/2023   TRIG 55 11/26/2023   HDL 65 11/26/2023   LDLCALC 54 11/26/2023   ALT 32 11/26/2023   AST 28 11/26/2023   NA 138 02/03/2024   K 4.1 02/03/2024   CL 101 02/03/2024   CREATININE 1.03 02/03/2024   BUN 13 02/03/2024   CO2 26 02/03/2024   TSH 0.616 11/26/2023   INR 1.0 10/03/2023   HGBA1C 5.3 12/02/2023    OTHER: EEG 10/07/23: IMPRESSION: This study is within normal limits. No seizures  or epileptiform discharges were seen throughout the recording. A normal interictal EEG does not exclude the diagnosis of epilepsy.     IMAGES: MRA Neck 01/24/24: In process.   MRI Brain 10/03/23: IMPRESSION: Unremarkable appearance of the brain for age.   CXR 12/02/23: FINDINGS: The heart size and mediastinal contours are within normal limits. Prior CABG again noted. Both lungs are clear. The visualized skeletal structures are unremarkable. IMPRESSION: No active cardiopulmonary disease.   CTA Head/Neck 10/03/23: IMPRESSION: 1. No intracranial large vessel occlusion or significant stenosis. 2. No hemodynamically significant stenosis in the neck. 3. 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   MRI L-spine 08/10/23: IMPRESSION: 1. Moderate left subarticular and foraminal stenosis at L2-3 secondary to a leftward disc protrusion and moderate facet hypertrophy on the left. 2. Focal rightward curvature at L2-3 with asymmetric type 1 Modic changes on the left at L2-3 suggesting more recent changes. 3. Mild right foraminal narrowing at L2-3. 4. Mild left foraminal narrowing at L3-4. 5. Mild foraminal narrowing bilaterally at L4-5 is worse on the left. 6. Shallow right paramedian disc protrusion at L5-S1 potentially contacts the traversing right S1 nerve roots.       EKG: 10/03/23: Sinus rhythm Prominent P waves, nondiagnostic  Incomplete right bundle branch block Confirmed by Davis, Jonathon [phone]) on 10/03/2023 2:06:00 PM       CV: Cardiac cath 08/29/23:   Estella Helling LAD to Prox LAD lesion is 80% stenosed.   Mid LAD lesion is 80% stenosed.   1st Diag lesion is 90% stenosed.   Prox RCA to Mid RCA lesion is 100% stenosed.   Prox Cx lesion is 80% stenosed.   Previously placed Origin to Prox Graft stent of unknown type is  widely patent.   Non-stenotic Mid Cx lesion was previously treated.   LIMA graft was visualized by  angiography.   SVG graft was visualized by angiography.   SVG graft was visualized by angiography.   The left ventricular systolic function is normal.   LV end diastolic pressure is normal.   The left ventricular ejection fraction is 50-55% by visual estimate.   Severe stenosis proximal and mid LAD. Patent LIMA graft to the mid LAD. Severe stenosis in the ostium of the small to moderate caliber Diagonal branch. Patent vein graft to the Diagonal branch with patent stent in the ostium of the Diagonal branch. The stent appears to extend into the aorta.  Severe stenosis in the mid Circumflex artery. Patent vein graft to the obtuse marginal branch.  Large dominant RCA with chronic occlusion of the proximal vessel. The distal vessel and branches fill from left to right collaterals.  Normal LV systolic function. LVEDP 11 mmHg.    Recommendations: The RCA occlusion is new since his cardiac cath in 2021. This vessel has brisk collateral filling. His CAD is otherwise stable. No targets for PCI. Continue medical management of CAD. He will resume his Ranexa  as his angina started when he stopped his Ranexa  this week. Discharge home tonight after bedrest.      Nuclear stress test 03/22/22:   Findings are consistent with ischemia. The study is low risk.   LV perfusion is abnormal. Defect 1: There is a small defect with mild reduction in uptake present in the inferior location(s) that is reversible. There is normal wall motion in the defect area. Consistent with ischemia.   Left ventricular function is normal. Nuclear stress EF: 72 %. The left ventricular ejection fraction is hyperdynamic (>65%). End diastolic cavity size is normal.   Prior study not available for comparison.     12 day Long Term Monitor 11/09/20:  Indication: Dizziness - The minimum heart rate was 48 bpm, maximum heart rate was 108 bpm, and average heart rate was 68 bpm. Predominant underlying rhythm was Sinus Rhythm. - Premature atrial  complexes were rare less than 1%. Premature Ventricular complexes were rare less than 1%. - No no ventricular tachycardia, no pauses, No AV block, no supraventricular tachycardia and no atrial fibrillation present. No patient triggered events or diary events reported.  - Conclusion: Normal/unremarkable study     Echo 12/06/20: IMPRESSIONS   1. GLS: -16.5. Left ventricular ejection fraction, by estimation, is 60  to 65%. The left ventricle has normal function. The left ventricle has no  regional wall motion abnormalities. Left ventricular diastolic parameters  were normal.   2. Right ventricular systolic function is normal. The right ventricular  size is normal. There is normal pulmonary artery systolic pressure.   3. The mitral valve is normal in structure. Mild mitral valve  regurgitation. No evidence of mitral stenosis.   4. The aortic valve is normal in structure. Aortic valve regurgitation is  not visualized. No aortic stenosis is present.   5.  The inferior vena cava is normal in size with greater than 50%  respiratory variability, suggesting right atrial pressure of 3 mmHg.     Past Medical History:  Diagnosis Date   Abnormal EKG 06/13/2023   Abnormal stress test small area of mild ischemia inferior wall 05/24/2022   Aphasia 06/13/2023   Arthropathy of lumbar facet joint 07/03/2023   Bilateral sacroiliitis (HCC) 08/29/2023   Bipolar 1 disorder 02/04/2023   Chronic bilateral low back pain with right-sided sciatica 06/24/2023   Chronic headaches 01/30/2021   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   Coronary artery disease involving coronary bypass graft of native heart with angina pectoris 05/22/2016   Cath 2013 LAD 95% stenosis, D1 90% stenosis, RCA 20%, Circ 30%. Previous stent to D1 2012. EF 60%.   Degeneration of lumbar intervertebral disc 07/03/2023   Dizziness 10/27/2020   Excessive  daytime sleepiness 09/11/2016   Gastroesophageal reflux disease 02/04/2023   Generalized anxiety disorder 05/22/2016   Herpes zoster 01/20/2020   History of small bowel obstruction    Admitted 10-14-2007 for partical small bowel obstruction and N.G. tube was placed. Admitted by Dr Maple Seltzer. 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 Small internal hemorrhoids. Otherwise normal colonoscopy to terminal ileum   CORONARY ARTERY BYPASS GRAFT  02/19/2012   L - LAD, SVG - D1, SVG - OM   CORONARY STENT INTERVENTION N/A 11/05/2019   Procedure: CORONARY STENT INTERVENTION;  Surgeon: Avanell Leigh, MD;  [facility]: [facility] INV[facility];  Service: Cardiovascular;  Laterality: N/A;  SVG to DIAG   ESOPHAGOGASTRODUODENOSCOPY  01/08/2007   Multiple duodenal ulcers with stenosis/stricture of the distal first portion of the duodenum (measuring approximantely 9mm) Mild gastritis.   LEFT HEART CATH AND CORS/GRAFTS ANGIOGRAPHY N/A 11/05/2019   Procedure: LEFT HEART CATH AND CORS/GRAFTS ANGIOGRAPHY;   Surgeon: Avanell Leigh, MD;  [facility]: [facility];  Service: Cardiovascular;  Laterality: N/A;   LEFT HEART CATH AND CORS/GRAFTS ANGIOGRAPHY N/A 08/29/2023   Procedure: LEFT HEART CATH AND CORS/GRAFTS ANGIOGRAPHY;  Surgeon: Odie Benne, MD;  Location: [facility];  Service: Cardiovascular;  Laterality: N/A;   RENAL ANGIOGRAPHY N/A 11/05/2019   Procedure: RENAL ANGIOGRAPHY;  Surgeon: Avanell Leigh, MD;  [facility]: [facility];  Service: Cardiovascular;  Laterality: N/A;   TONSILLECTOMY AND ADENOIDECTOMY     WRIST SURGERY Right     MEDICATIONS: No current facility-administered medications for this encounter.    acetaminophen  (TYLENOL ) 500 MG tablet   aspirin  EC 81  MG tablet   cariprazine  (VRAYLAR ) 3 MG capsule   clopidogrel  (PLAVIX ) 75 MG tablet   DULoxetine  (CYMBALTA ) 60 MG capsule   ezetimibe  (ZETIA ) 10 MG tablet   fexofenadine  (ALLEGRA ) 60 MG tablet   hydrochlorothiazide  (HYDRODIURIL ) 12.5 MG tablet   isosorbide  mononitrate (IMDUR ) 60 MG 24 hr tablet   LORazepam  (ATIVAN ) 1 MG tablet   meclizine  (ANTIVERT ) 25 MG tablet   Multiple Vitamin (MULTIVITAMIN WITH MINERALS) TABS tablet   nitroGLYCERIN  (NITROSTAT ) 0.4 MG SL tablet   ondansetron  (ZOFRAN ) 4 MG tablet   pantoprazole  (PROTONIX ) 40 MG tablet   ranolazine  (RANEXA ) 1000 MG SR tablet   rosuvastatin  (CRESTOR ) 40 MG tablet   Semaglutide -Weight Management (WEGOVY ) 2.4 MG/0.75ML SOAJ   Solriamfetol  HCl (SUNOSI ) 150 MG TABS   lithium  carbonate (LITHOBID ) 300 MG ER tablet   traMADol  (ULTRAM ) 50 MG tablet    Ella Gun, PA-C Surgical Short Stay/Anesthesiology [facility] Phone [phone] [facility] Phone [phone] 02/13/2024 11:32 AM

## 2024-02-14 ENCOUNTER — Encounter (HOSPITAL_COMMUNITY): Payer: Self-pay | Admitting: Neurosurgery

## 2024-02-14 ENCOUNTER — Other Ambulatory Visit (HOSPITAL_BASED_OUTPATIENT_CLINIC_OR_DEPARTMENT_OTHER): Payer: Self-pay

## 2024-02-14 ENCOUNTER — Ambulatory Visit (HOSPITAL_BASED_OUTPATIENT_CLINIC_OR_DEPARTMENT_OTHER): Payer: Self-pay | Admitting: Physician Assistant

## 2024-02-14 ENCOUNTER — Encounter (HOSPITAL_COMMUNITY)
Admission: RE | Disposition: A | Discharge status: Home / Self Care | Payer: Self-pay | Source: Home / Self Care | Attending: Neurosurgery

## 2024-02-14 ENCOUNTER — Ambulatory Visit (HOSPITAL_COMMUNITY): Payer: Self-pay | Admitting: Vascular Surgery

## 2024-02-14 ENCOUNTER — Ambulatory Visit (HOSPITAL_COMMUNITY)

## 2024-02-14 ENCOUNTER — Other Ambulatory Visit: Payer: Self-pay

## 2024-02-14 ENCOUNTER — Ambulatory Visit (HOSPITAL_COMMUNITY)
Admission: RE | Admit: 2024-02-14 | Discharge: 2024-02-14 | Disposition: A | Discharge status: Home / Self Care | Attending: Neurosurgery | Admitting: Neurosurgery

## 2024-02-14 DIAGNOSIS — K219 Gastro-esophageal reflux disease without esophagitis: Secondary | ICD-10-CM | POA: Insufficient documentation

## 2024-02-14 DIAGNOSIS — I10 Essential (primary) hypertension: Secondary | ICD-10-CM | POA: Diagnosis not present

## 2024-02-14 DIAGNOSIS — M48061 Spinal stenosis, lumbar region without neurogenic claudication: Secondary | ICD-10-CM | POA: Diagnosis not present

## 2024-02-14 DIAGNOSIS — M5116 Intervertebral disc disorders with radiculopathy, lumbar region: Secondary | ICD-10-CM | POA: Diagnosis not present

## 2024-02-14 DIAGNOSIS — M5126 Other intervertebral disc displacement, lumbar region: Secondary | ICD-10-CM | POA: Diagnosis not present

## 2024-02-14 DIAGNOSIS — I2510 Atherosclerotic heart disease of native coronary artery without angina pectoris: Secondary | ICD-10-CM | POA: Insufficient documentation

## 2024-02-14 DIAGNOSIS — F319 Bipolar disorder, unspecified: Secondary | ICD-10-CM | POA: Diagnosis not present

## 2024-02-14 DIAGNOSIS — M5416 Radiculopathy, lumbar region: Secondary | ICD-10-CM | POA: Diagnosis present

## 2024-02-14 DIAGNOSIS — Z79899 Other long term (current) drug therapy: Secondary | ICD-10-CM | POA: Diagnosis not present

## 2024-02-14 DIAGNOSIS — Z7985 Long-term (current) use of injectable non-insulin antidiabetic drugs: Secondary | ICD-10-CM | POA: Diagnosis not present

## 2024-02-14 DIAGNOSIS — G4733 Obstructive sleep apnea (adult) (pediatric): Secondary | ICD-10-CM | POA: Insufficient documentation

## 2024-02-14 DIAGNOSIS — Z8673 Personal history of transient ischemic attack (TIA), and cerebral infarction without residual deficits: Secondary | ICD-10-CM | POA: Insufficient documentation

## 2024-02-14 DIAGNOSIS — E7800 Pure hypercholesterolemia, unspecified: Secondary | ICD-10-CM | POA: Insufficient documentation

## 2024-02-14 DIAGNOSIS — Z0189 Encounter for other specified special examinations: Secondary | ICD-10-CM | POA: Diagnosis not present

## 2024-02-14 HISTORY — PX: LUMBAR LAMINECTOMY/DECOMPRESSION MICRODISCECTOMY: SHX5026

## 2024-02-14 SURGERY — LUMBAR LAMINECTOMY/DECOMPRESSION MICRODISCECTOMY 1 LEVEL
Anesthesia: General | Laterality: Left

## 2024-02-14 MED ORDER — BUPIVACAINE HCL (PF) 0.5 % IJ SOLN
INTRAMUSCULAR | Status: DC | PRN
  Administered 2024-02-14: 5 mL

## 2024-02-14 MED ORDER — CEFAZOLIN SODIUM-DEXTROSE 2-3 GM-%(50ML) IV SOLR
INTRAVENOUS | Status: DC | PRN
  Administered 2024-02-14: 2 g via INTRAVENOUS

## 2024-02-14 MED ORDER — LIDOCAINE 2% (20 MG/ML) 5 ML SYRINGE
INTRAMUSCULAR | Status: DC | PRN
  Administered 2024-02-14: 20 mg via INTRAVENOUS

## 2024-02-14 MED ORDER — HYDROMORPHONE HCL 1 MG/ML IJ SOLN: 0.2500 mg | INTRAMUSCULAR | Status: DC | PRN

## 2024-02-14 MED ORDER — LIDOCAINE-EPINEPHRINE 1 %-1:100000 IJ SOLN
INTRAMUSCULAR | Status: AC
Start: 2024-02-14 — End: ?
  Filled 2024-02-14: qty 1

## 2024-02-14 MED ORDER — MIDAZOLAM HCL 2 MG/2ML IJ SOLN
INTRAMUSCULAR | Status: DC | PRN
Start: 2024-02-14 — End: 2024-02-14
  Administered 2024-02-14: 2 mg via INTRAVENOUS

## 2024-02-14 MED ORDER — METHYLPREDNISOLONE ACETATE 80 MG/ML IJ SUSP
INTRAMUSCULAR | Status: DC | PRN
  Administered 2024-02-14: 80 mg

## 2024-02-14 MED ORDER — METHYLPREDNISOLONE ACETATE 80 MG/ML IJ SUSP
INTRAMUSCULAR | Status: AC
  Filled 2024-02-14: qty 1

## 2024-02-14 MED ORDER — PROPOFOL 10 MG/ML IV BOLUS
INTRAVENOUS | Status: AC
  Filled 2024-02-14: qty 20

## 2024-02-14 MED ORDER — CYCLOBENZAPRINE HCL 10 MG PO TABS
10.0000 mg | ORAL_TABLET | Freq: Three times a day (TID) | ORAL | 0 refills | Status: DC | PRN
  Filled 2024-02-14: qty 30, 10d supply, fill #0

## 2024-02-14 MED ORDER — ACETAMINOPHEN 500 MG PO TABS
1000.0000 mg | ORAL_TABLET | Freq: Once | ORAL | Status: AC
  Administered 2024-02-14: 1000 mg via ORAL
  Filled 2024-02-14: qty 2

## 2024-02-14 MED ORDER — ORAL CARE MOUTH RINSE: 15.0000 mL | Freq: Once | OROMUCOSAL | Status: AC

## 2024-02-14 MED ORDER — MEPERIDINE HCL 25 MG/ML IJ SOLN: 6.2500 mg | INTRAMUSCULAR | Status: DC | PRN

## 2024-02-14 MED ORDER — SUGAMMADEX SODIUM 200 MG/2ML IV SOLN
INTRAVENOUS | Status: DC | PRN
  Administered 2024-02-14: 200 mg via INTRAVENOUS

## 2024-02-14 MED ORDER — OXYCODONE HCL 5 MG PO TABS: 5.0000 mg | ORAL_TABLET | Freq: Once | ORAL | Status: DC | PRN

## 2024-02-14 MED ORDER — FENTANYL CITRATE (PF) 250 MCG/5ML IJ SOLN
INTRAMUSCULAR | Status: AC
  Filled 2024-02-14: qty 5

## 2024-02-14 MED ORDER — OXYCODONE-ACETAMINOPHEN 5-325 MG PO TABS
1.0000 | ORAL_TABLET | Freq: Four times a day (QID) | ORAL | 0 refills | Status: AC | PRN
  Filled 2024-02-14: qty 28, 7d supply, fill #0

## 2024-02-14 MED ORDER — THROMBIN (RECOMBINANT) 5000 UNITS EX SOLR: CUTANEOUS | Status: DC | PRN

## 2024-02-14 MED ORDER — BUPIVACAINE HCL (PF) 0.5 % IJ SOLN
INTRAMUSCULAR | Status: AC
  Filled 2024-02-14: qty 30

## 2024-02-14 MED ORDER — LACTATED RINGERS IV SOLN
INTRAVENOUS | Status: DC | PRN
Start: 2024-02-14 — End: 2024-02-14

## 2024-02-14 MED ORDER — THROMBIN 5000 UNITS EX KIT
PACK | CUTANEOUS | Status: AC
  Filled 2024-02-14: qty 3

## 2024-02-14 MED ORDER — PROPOFOL 10 MG/ML IV BOLUS
INTRAVENOUS | Status: DC | PRN
  Administered 2024-02-14: 100 mg via INTRAVENOUS

## 2024-02-14 MED ORDER — LIDOCAINE-EPINEPHRINE 1 %-1:100000 IJ SOLN
INTRAMUSCULAR | Status: DC | PRN
  Administered 2024-02-14: 5 mL

## 2024-02-14 MED ORDER — MIDAZOLAM HCL 2 MG/2ML IJ SOLN
INTRAMUSCULAR | Status: AC
  Filled 2024-02-14: qty 2

## 2024-02-14 MED ORDER — 0.9 % SODIUM CHLORIDE (POUR BTL) OPTIME
TOPICAL | Status: DC | PRN
  Administered 2024-02-14: 1000 mL

## 2024-02-14 MED ORDER — FENTANYL CITRATE (PF) 250 MCG/5ML IJ SOLN
INTRAMUSCULAR | Status: DC | PRN
Start: 2024-02-14 — End: 2024-02-14
  Administered 2024-02-14 (×3): 50 ug via INTRAVENOUS

## 2024-02-14 MED ORDER — PHENYLEPHRINE HCL-NACL 20-0.9 MG/250ML-% IV SOLN
INTRAVENOUS | Status: DC | PRN
Start: 2024-02-14 — End: 2024-02-14
  Administered 2024-02-14: 20 ug/min via INTRAVENOUS

## 2024-02-14 MED ORDER — CHLORHEXIDINE GLUCONATE 0.12 % MT SOLN
15.0000 mL | Freq: Once | OROMUCOSAL | Status: AC
  Administered 2024-02-14: 15 mL via OROMUCOSAL
  Filled 2024-02-14: qty 15

## 2024-02-14 MED ORDER — MIDAZOLAM HCL 2 MG/2ML IJ SOLN: 0.5000 mg | Freq: Once | INTRAMUSCULAR | Status: DC | PRN

## 2024-02-14 MED ORDER — THROMBIN 5000 UNITS EX SOLR
OROMUCOSAL | Status: DC | PRN
  Administered 2024-02-14: 5 mL via TOPICAL

## 2024-02-14 MED ORDER — ONDANSETRON HCL 4 MG/2ML IJ SOLN
INTRAMUSCULAR | Status: DC | PRN
  Administered 2024-02-14: 4 mg via INTRAVENOUS

## 2024-02-14 MED ORDER — DEXAMETHASONE SODIUM PHOSPHATE 10 MG/ML IJ SOLN
INTRAMUSCULAR | Status: DC | PRN
  Administered 2024-02-14: 10 mg via INTRAVENOUS

## 2024-02-14 MED ORDER — OXYCODONE HCL 5 MG/5ML PO SOLN: 5.0000 mg | Freq: Once | ORAL | Status: DC | PRN

## 2024-02-14 MED ORDER — ROCURONIUM BROMIDE 10 MG/ML (PF) SYRINGE
PREFILLED_SYRINGE | INTRAVENOUS | Status: DC | PRN
  Administered 2024-02-14: 60 mg via INTRAVENOUS

## 2024-02-14 SURGICAL SUPPLY — 49 items
BAG COUNTER SPONGE SURGICOUNT (BAG) ×1 IMPLANT
BENZOIN TINCTURE PRP APPL 2/3 (GAUZE/BANDAGES/DRESSINGS) IMPLANT
BLADE CLIPPER SURG (BLADE) IMPLANT
BLADE SURG 11 STRL SS (BLADE) ×1 IMPLANT
BUR MATCHSTICK NEURO 3.0 LAGG (BURR) ×1 IMPLANT
BUR PRECISION FLUTE 5.0 (BURR) IMPLANT
CANISTER SUCT 3000ML PPV (MISCELLANEOUS) ×1 IMPLANT
DERMABOND ADVANCED .7 DNX12 (GAUZE/BANDAGES/DRESSINGS) ×1 IMPLANT
DRAPE LAPAROTOMY 100X72X124 (DRAPES) ×1 IMPLANT
DRAPE MICROSCOPE SLANT 54X150 (MISCELLANEOUS) ×1 IMPLANT
DRAPE SURG 17X23 STRL (DRAPES) ×1 IMPLANT
DRSG OPSITE POSTOP 3X4 (GAUZE/BANDAGES/DRESSINGS) ×1 IMPLANT
DRSG OPSITE POSTOP 4X6 (GAUZE/BANDAGES/DRESSINGS) IMPLANT
DURAPREP 26ML APPLICATOR (WOUND CARE) ×1 IMPLANT
ELECTRODE REM PT RTRN 9FT ADLT (ELECTROSURGICAL) ×1 IMPLANT
GAUZE 4X4 16PLY ~~LOC~~+RFID DBL (SPONGE) IMPLANT
GAUZE SPONGE 4X4 12PLY STRL (GAUZE/BANDAGES/DRESSINGS) IMPLANT
GLOVE BIOGEL PI IND STRL 7.5 (GLOVE) ×1 IMPLANT
GLOVE ECLIPSE 7.0 STRL STRAW (GLOVE) ×1 IMPLANT
GLOVE EXAM NITRILE XL STR (GLOVE) IMPLANT
GOWN STRL REUS W/ TWL LRG LVL3 (GOWN DISPOSABLE) ×2 IMPLANT
GOWN STRL REUS W/ TWL XL LVL3 (GOWN DISPOSABLE) IMPLANT
GOWN STRL REUS W/TWL 2XL LVL3 (GOWN DISPOSABLE) IMPLANT
HEMOSTAT POWDER KIT SURGIFOAM (HEMOSTASIS) ×1 IMPLANT
KIT BASIN OR (CUSTOM PROCEDURE TRAY) ×1 IMPLANT
KIT TURNOVER KIT B (KITS) ×1 IMPLANT
NDL HYPO 18GX1.5 BLUNT FILL (NEEDLE) IMPLANT
NDL HYPO 22X1.5 SAFETY MO (MISCELLANEOUS) ×1 IMPLANT
NDL SPNL 18GX3.5 QUINCKE PK (NEEDLE) IMPLANT
NEEDLE HYPO 18GX1.5 BLUNT FILL (NEEDLE) IMPLANT
NEEDLE HYPO 22X1.5 SAFETY MO (MISCELLANEOUS) ×1 IMPLANT
NEEDLE SPNL 18GX3.5 QUINCKE PK (NEEDLE) ×1 IMPLANT
NS IRRIG 1000ML POUR BTL (IV SOLUTION) ×1 IMPLANT
PACK LAMINECTOMY NEURO (CUSTOM PROCEDURE TRAY) ×1 IMPLANT
PAD ARMBOARD POSITIONER FOAM (MISCELLANEOUS) ×3 IMPLANT
PATTIES SURGICAL .5 X.5 (GAUZE/BANDAGES/DRESSINGS) ×1 IMPLANT
PATTIES SURGICAL 1X1 (DISPOSABLE) ×1 IMPLANT
SPIKE FLUID TRANSFER (MISCELLANEOUS) ×1 IMPLANT
SPONGE SURGIFOAM ABS GEL SZ50 (HEMOSTASIS) ×1 IMPLANT
SPONGE T-LAP 4X18 ~~LOC~~+RFID (SPONGE) IMPLANT
STRIP CLOSURE SKIN 1/2X4 (GAUZE/BANDAGES/DRESSINGS) IMPLANT
SUT VIC AB 0 CT1 18XCR BRD8 (SUTURE) ×1 IMPLANT
SUT VIC AB 2-0 CT1 18 (SUTURE) IMPLANT
SUT VICRYL 3-0 RB1 18 ABS (SUTURE) ×2 IMPLANT
SYR 30ML SLIP (SYRINGE) ×1 IMPLANT
SYR 3ML LL SCALE MARK (SYRINGE) IMPLANT
TOWEL GREEN STERILE (TOWEL DISPOSABLE) ×1 IMPLANT
TOWEL GREEN STERILE FF (TOWEL DISPOSABLE) ×1 IMPLANT
WATER STERILE IRR 1000ML POUR (IV SOLUTION) ×1 IMPLANT

## 2024-02-14 NOTE — Anesthesia Procedure Notes (Signed)
 Procedure Name: Intubation Date/Time: 02/14/2024 8:25 AM  Performed by: Hershall Lory, CRNAPre-anesthesia Checklist: Patient identified, Emergency Drugs available, Suction available and Patient being monitored Patient Re-evaluated:Patient Re-evaluated prior to induction Oxygen Delivery Method: Circle system utilized Preoxygenation: Pre-oxygenation with 100% oxygen Induction Type: IV induction Ventilation: Mask ventilation without difficulty Laryngoscope Size: Glidescope Grade View: Grade I Tube type: Oral Tube size: 7.5 mm Number of attempts: 1 Airway Equipment and Method: Stylet and Oral airway Placement Confirmation: ETT inserted through vocal cords under direct vision, positive ETCO2 and breath sounds checked- equal and bilateral Secured at: 23 cm Tube secured with: Tape Dental Injury: Teeth and Oropharynx as per pre-operative assessment

## 2024-02-14 NOTE — Discharge Summary (Signed)
 Physician Discharge Summary  Patient ID: Jon Gonzales MRN: [id number] DOB/AGE: [birth year] 63 y.o.  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 *   Discharged Condition: Stable  Hospital Course:  [name] is a 63 y.o. male who underwent Left L2-3 laminotomy and microdiscectomy which was done without complication. Postoperatively the patient was at neurologic baseline. Back pain was controlled with oral medication, he was ambulating without difficulty, voiding normally, and tolerating diet.  Treatments: Surgery - left L2-3 laminotomy, microdiscectomy  Discharge Exam: Blood pressure 111/84, pulse 79, temperature 97.8 F (36.6 C), temperature source Oral, resp. rate 18, height 5\' 10"  (1.778 m), weight 76.7 kg, SpO2 96%. Awake, alert, oriented Speech fluent, appropriate CN grossly intact 5/5 BUE/BLE Wound c/d/i  Follow-up: Follow-up in my office [facility] [phone]) in 2-3 weeks  Disposition: Discharge disposition: 01-Home or Self Care       Discharge Instructions     Call MD for:  redness, tenderness, or signs of infection (pain, swelling, redness, odor or green/yellow discharge around incision site)   Complete by: As directed    Call MD for:  temperature >100.4   Complete by: As directed    Diet - low sodium heart healthy   Complete by: As directed    Discharge instructions   Complete by: As directed    Walk at home as much as possible, at least 4 times / day   Increase activity slowly   Complete by: As directed    Lifting restrictions   Complete by: As directed    No lifting > 10 lbs   May shower / Bathe   Complete by: As directed    48 hours after surgery   May walk up steps   Complete by: As directed    Other Restrictions   Complete by: As directed    No  bending/twisting at waist   Remove dressing in 48 hours   Complete by: As directed       Allergies as of 02/14/2024       Reactions   Trintellix [vortioxetine] Other (See Comments)   Headaches.   Penicillins Rash        Medication List     PAUSE taking these medications    Aspirin  Low Dose 81 MG tablet Wait to take this until: Feb 17, 2024 Generic drug: aspirin  EC Take 1 tablet (81 mg total) by mouth daily.   clopidogrel  75 MG tablet Wait to take this until: Feb 19, 2024 Commonly known as: PLAVIX  Take 1 tablet (75 mg total) by mouth daily.       STOP taking these medications    lithium  carbonate 300 MG ER tablet Commonly known as: LITHOBID        TAKE these medications    acetaminophen  500 MG tablet Commonly known as: TYLENOL  Take 500-1,000 mg by mouth every 6 (six) hours as needed for moderate pain (pain score 4-6).   cyclobenzaprine  10 MG tablet Commonly known as: FLEXERIL  Take 1 tablet (10 mg total) by mouth 3 (three) times daily as needed for muscle spasms.   DULoxetine  60 MG capsule Commonly known as: CYMBALTA  Take 1 capsule (60 mg total) by mouth 2 (two) times daily.   ezetimibe  10 MG tablet Commonly known as: ZETIA  Take 1 tablet (10 mg total) by mouth daily.   fexofenadine  60 MG tablet Commonly  known as: ALLEGRA  Take 1 tablet (60 mg total) by mouth daily.   hydrochlorothiazide  12.5 MG tablet Commonly known as: HYDRODIURIL  Take 1 tablet (12.5 mg total) by mouth daily.   isosorbide  mononitrate 60 MG 24 hr tablet Commonly known as: IMDUR  Take 2 tablets (120 mg total) by mouth daily.   LORazepam  1 MG tablet Commonly known as: ATIVAN  Take 1 tablet (1 mg total) by mouth every 8 (eight) hours as needed for anxiety.   meclizine  25 MG tablet Commonly known as: ANTIVERT  Take 1 tablet (25 mg total) by mouth 3 (three) times daily as needed for dizziness.   metoprolol  succinate 50 MG 24 hr tablet Commonly known as: TOPROL -XL Take 50 mg by mouth  daily. Take with or immediately following a meal.   multivitamin with minerals Tabs tablet Take 1 tablet by mouth daily. Men's Multivitamin   nitroGLYCERIN  0.4 MG SL tablet Commonly known as: NITROSTAT  Dissolve 1 tablet (0.4 mg total) under the tongue every 5 (five) minutes up to 3 doses as needed for chest pain. If no relief after 3 doses call 911.   ondansetron  4 MG tablet Commonly known as: Zofran  Take 1 tablet (4 mg total) by mouth every 8 (eight) hours as needed for nausea or vomiting.   oxyCODONE -acetaminophen  5-325 MG tablet Commonly known as: Percocet Take 1 tablet by mouth every 6 (six) hours as needed for up to 7 days for severe pain (pain score 7-10).   pantoprazole  40 MG tablet Commonly known as: PROTONIX  Take 1 tablet (40 mg total) by mouth daily.   ranolazine  1000 MG SR tablet Commonly known as: RANEXA  Take 1 tablet by mouth 2 times daily.   rosuvastatin  40 MG tablet Commonly known as: CRESTOR  Take 1 tablet (40 mg total) by mouth daily. What changed: when to take this   Sunosi  150 MG Tabs Generic drug: Solriamfetol  HCl Take 1 tablet (150 mg total) by mouth every morning.   traMADol  50 MG tablet Commonly known as: ULTRAM  Take 1 tablet (50 mg total) by mouth every 6 (six) hours as needed.   Vraylar  3 MG capsule Generic drug: cariprazine  Take 1 capsule (3 mg total) by mouth daily.   Wegovy  2.4 MG/0.75ML Soaj Generic drug: Semaglutide -Weight Management Inject 2 mg into the skin once a week. Saturday        Follow-up Information     Augusto Blonder, MD Follow up in 3 week(s).   Specialty: Neurosurgery Contact information: [address] [place] [address] [phone]                 Signed: Deatra Face 02/14/2024, 10:22 AM

## 2024-02-14 NOTE — Transfer of Care (Signed)
 Immediate Anesthesia Transfer of Care Note  Patient: Jon Gonzales  Procedure(s) Performed: LUMBAR LAMINECTOMY/DECOMPRESSION MICRODISCECTOMY LEFT LUMBAR TWO-LUMBAR THREE (Left)  Patient Location: PACU  Anesthesia Type:General  Level of Consciousness: awake, alert , and oriented  Airway & Oxygen Therapy: Patient Spontanous Breathing  Post-op Assessment: Report given to RN  Post vital signs: Reviewed and stable  Last Vitals:  Vitals Value Taken Time  BP 108/75 02/14/24 1045  Temp 36.6 C 02/14/24 1042  Pulse 75 02/14/24 1048  Resp 17 02/14/24 1048  SpO2 94 % 02/14/24 1048  Vitals shown include unfiled device data.  Last Pain:  Vitals:   02/14/24 0623  TempSrc:   PainSc: 0-No pain         Complications: No notable events documented.

## 2024-02-14 NOTE — Op Note (Signed)
  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 Left L2-3 laminotomy and microdiscectomy for decompression of nerve root 2. Use of operating microscope  SURGEON: Dr. Augusto Blonder, MD  ASSISTANT: Easter Golden, PA-C  ANESTHESIA: General Endotracheal  EBL: Minimal  SPECIMENS: None  DRAINS: None  COMPLICATIONS: None immediate  CONDITION: Hemodynamically stable to PACU  HISTORY: Jon Gonzales is a 63 y.o. male with a chronic history of left-sided back and leg pain.  Previous imaging has revealed a large partially calcified disc herniation on the left at L2-3 likely explaining his symptoms.  He attempted conservative treatment over the course of several months without significant improvement in his back and leg pain.  He therefore elected to proceed with surgical decompression.  Risks, benefits, and alternatives to surgery were all reviewed in detail with the patient and his wife.  After all questions were answered informed consent was obtained and witnessed.  PROCEDURE IN DETAIL: After informed consent was obtained and witnessed, the patient was brought to the operating room. After induction of general anesthesia, the patient was positioned on the operative table in the prone position with all pressure points meticulously padded. The skin of the low back was then prepped and draped in the usual sterile fashion.  After timeout was conducted, spinal needle was introduced in order to identify the surface projection of the L2-3 interspace.  Midline skin incision was then infiltrated with local anesthetic with epinephrine .  Skin incision was then made sharply and Bovie electrocautery was used to dissect the subcutaneous tissue until the lumbodorsal fascia was identified. The fashion was then incised using Bovie electrocautery and the lamina at the 2 L3 levels was identified and dissection was carried out in the  subperiosteal plane. Self-retaining retractor was then placed, and intraoperative x-ray was taken to confirm we were at the correct level.  Using a high-speed drill, laminotomy was completed with a partial medial facetectomy. The ligamentum flavum was then identified and removed and the lateral edge of the thecal sac was identified.  I then carefully dissected in the ventral epidural space.  Identified the L2-3 disc space.  There appeared to be a large calcified disc herniation displacing the traversing right L3 nerve root laterally.  Using a combination of curettes, the calcified disc was removed piecemeal.  As this occurred, the L3 nerve root took up a more normal position in the lateral recess.  After decompression I was able to easily pass a small ball dissector within the ventral epidural space and underneath the L3 nerve root indicating good decompression.  Hemostasis was then secured using a combination of morcellized Gelfoam and thrombin  and bipolar electrocautery. The wound is irrigated with copious amounts of antibiotic saline irrigation. The nerve root was then covered with a long-acting steroid solution. Self-retaining retractor was then removed, and the wound is closed in layers using a combination of interrupted 0 Vicryl and 3-0 Vicryl stitches. The skin was closed using standard skin glue.  At the end of the case all sponge, needle, and instrument counts were correct. The patient was then transferred to the stretcher and taken to the postanesthesia care unit in stable hemodynamic condition.   Augusto Blonder, MD [facility]

## 2024-02-14 NOTE — Anesthesia Postprocedure Evaluation (Signed)
 Anesthesia Post Note  Patient: Jon Gonzales  Procedure(s) Performed: LUMBAR LAMINECTOMY/DECOMPRESSION MICRODISCECTOMY LEFT LUMBAR TWO-LUMBAR THREE (Left)     Patient location during evaluation: PACU Anesthesia Type: General Level of consciousness: awake and alert, patient cooperative and oriented Pain management: pain level controlled Vital Signs Assessment: post-procedure vital signs reviewed and stable Respiratory status: spontaneous breathing, nonlabored ventilation and respiratory function stable Cardiovascular status: blood pressure returned to baseline and stable Postop Assessment: no apparent nausea or vomiting and able to ambulate Anesthetic complications: no   No notable events documented.  Last Vitals:  Vitals:   02/14/24 1100 02/14/24 1115  BP: 101/74 103/71  Pulse: 77 75  Resp: 20 11  Temp:  36.4 C  SpO2: 93% 96%    Last Pain:  Vitals:   02/14/24 1115  TempSrc:   PainSc: 0-No pain                 Soul Deveney,E. Boruch Manuele

## 2024-02-15 ENCOUNTER — Encounter (HOSPITAL_COMMUNITY): Payer: Self-pay | Admitting: Neurosurgery

## 2024-02-17 ENCOUNTER — Other Ambulatory Visit (HOSPITAL_COMMUNITY): Payer: Self-pay

## 2024-02-17 ENCOUNTER — Other Ambulatory Visit: Payer: Self-pay

## 2024-02-17 MED FILL — Thrombin For Soln 5000 Unit: CUTANEOUS | Qty: 2 | Status: AC

## 2024-02-18 ENCOUNTER — Other Ambulatory Visit: Payer: Self-pay

## 2024-02-21 ENCOUNTER — Encounter (HOSPITAL_COMMUNITY): Payer: Self-pay

## 2024-02-24 ENCOUNTER — Other Ambulatory Visit: Payer: Self-pay

## 2024-02-28 ENCOUNTER — Ambulatory Visit: Payer: 59 | Admitting: Physician Assistant

## 2024-02-28 ENCOUNTER — Encounter: Payer: Self-pay | Admitting: Physician Assistant

## 2024-02-28 VITALS — BP 102/68 | HR 73 | Temp 98.2°F | Resp 18 | Ht 70.0 in | Wt 177.0 lb

## 2024-02-28 DIAGNOSIS — I2510 Atherosclerotic heart disease of native coronary artery without angina pectoris: Secondary | ICD-10-CM | POA: Diagnosis not present

## 2024-02-28 DIAGNOSIS — F314 Bipolar disorder, current episode depressed, severe, without psychotic features: Secondary | ICD-10-CM | POA: Diagnosis not present

## 2024-02-28 DIAGNOSIS — Z1211 Encounter for screening for malignant neoplasm of colon: Secondary | ICD-10-CM | POA: Diagnosis not present

## 2024-02-28 DIAGNOSIS — Z7901 Long term (current) use of anticoagulants: Secondary | ICD-10-CM | POA: Insufficient documentation

## 2024-02-28 DIAGNOSIS — Z23 Encounter for immunization: Secondary | ICD-10-CM

## 2024-02-28 DIAGNOSIS — F411 Generalized anxiety disorder: Secondary | ICD-10-CM | POA: Diagnosis not present

## 2024-02-28 DIAGNOSIS — R739 Hyperglycemia, unspecified: Secondary | ICD-10-CM | POA: Insufficient documentation

## 2024-02-28 DIAGNOSIS — G4719 Other hypersomnia: Secondary | ICD-10-CM

## 2024-02-28 DIAGNOSIS — I10 Essential (primary) hypertension: Secondary | ICD-10-CM | POA: Diagnosis not present

## 2024-02-28 NOTE — Progress Notes (Signed)
 Subjective:  Patient ID: Jon Gonzales, male    DOB: 1961-02-02  Age: 63 y.o. MRN: [id number]  Chief Complaint  Patient presents with   Medical Management of Chronic Issues    HPI   Pt presents for follow up of hypertension. The patient is tolerating the medication well without side effects. Compliance with treatment has been good; including taking medication as directed , maintains a healthy diet and regular exercise regimen , and following up as directed. He is currently taking HCTZ12.5mg  and  Toprol  XL 50mg  ,  Pt also has history of CAD with open heart surgery in 2010- he is taking imdur  60mg  qd , Ranexa  , crestor  40mg  ,zetia  10  has nitrostat  to use if needed , plavix  75mg  and ASA qd He follows with Dr Gordan Latina regularly and is due for appt  Mixed hyperlipidemia  Pt presents with hyperlipidemia.  Compliance with treatment has been good -The patient is compliant with medications, maintains a low cholesterol diet , follows up as directed , and maintains an exercise regimen . The patient denies experiencing any hypercholesterolemia related symptoms.  He is currently taking Crestor  40mg  qd and Zetia  10mg  qd   Pt with history of GERD - stable on protonix  40mg  qd  Pt has followed with [facility] for probable Menieres disease.  - states he uses meclizine  as needed which does help relieve his symptoms.  He has been having these issues for about 2 years He states that he has not had problems with this in several weeks  Pt with history of bipolar disorder with anxiety and depression.  He states at this time symptoms are controlled with medication.  He is currently following with psychiatry and has appt on Monday -   Currently takes cymbalta  60mg  bid , ativan  prn Voices no concerns or problems today  Pt recently had back surgery for lumbar laminectomy and is doing well - uses flexeril  and advil as needed for pain  Pt is following with neurologist Dr Sinda Duel for excessive daytime sleepiness -  he is prescribed Sunosi  - has follow up appt next week  Pt agreeable to get pneumo 20 vaccine Pt agreeable to schedule for screening colonscopy      02/28/2024   11:31 AM 06/11/2023    3:30 PM 04/18/2022   10:08 PM 01/17/2022    4:14 PM  Depression screen PHQ 2/9  Decreased Interest 2 1 3 3   Down, Depressed, Hopeless 1 2 3 2   PHQ - 2 Score 3 3 6 5   Altered sleeping 1 0 3 3  Tired, decreased energy 1 0 3 1  Change in appetite 0 0 2 1  Feeling bad or failure about yourself  1 1 3 3   Trouble concentrating 2 3 2 2   Moving slowly or fidgety/restless 1 0 0 0  Suicidal thoughts 1 2 1 1   PHQ-9 Score 10 9 20 16   Difficult doing work/chores Somewhat difficult Somewhat difficult Somewhat difficult          08/11/2022    9:58 AM 08/28/2022    1:33 PM 09/18/2022    9:35 AM 06/11/2023    3:30 PM 09/20/2023    1:31 PM  Fall Risk  Falls in the past year? 0 0 0 1 0  Was there an injury with Fall? 0 0 0 0 0  Fall Risk Category Calculator 0 0 0 1 0  Fall Risk Category (Retired) Low Low Low    (RETIRED) Patient Fall Risk Level  Low fall risk Low fall risk    Patient at Risk for Falls Due to   No Fall Risks No Fall Risks   Fall risk Follow up  Falls evaluation completed Falls evaluation completed Falls evaluation completed     CONSTITUTIONAL: Negative for chills, fatigue, fever, unintentional weight gain and unintentional weight loss.  E/N/T: Negative for ear pain, nasal congestion and sore throat.  CARDIOVASCULAR: Negative for chest pain, dizziness, palpitations and pedal edema.  RESPIRATORY: Negative for recent cough and dyspnea.  GASTROINTESTINAL: Negative for abdominal pain, acid reflux symptoms, constipation, diarrhea, nausea and vomiting.  MSK: see HPI INTEGUMENTARY: Negative for rash.  NEUROLOGICAL: see HPI PSYCHIATRIC: Negative for sleep disturbance and to question depression screen.  Negative for depression, negative for anhedonia.      Current Outpatient Medications on File Prior to  Visit  Medication Sig Dispense Refill   acetaminophen  (TYLENOL ) 500 MG tablet Take 500-1,000 mg by mouth every 6 (six) hours as needed for moderate pain (pain score 4-6).     aspirin  EC 81 MG tablet Take 1 tablet (81 mg total) by mouth daily. 90 tablet 2   cariprazine  (VRAYLAR ) 3 MG capsule Take 1 capsule (3 mg total) by mouth daily. 90 capsule 0   clopidogrel  (PLAVIX ) 75 MG tablet Take 1 tablet (75 mg total) by mouth daily. 90 tablet 1   cyclobenzaprine  (FLEXERIL ) 10 MG tablet Take 1 tablet (10 mg total) by mouth 3 (three) times daily as needed for muscle spasms. 30 tablet 0   DULoxetine  (CYMBALTA ) 60 MG capsule Take 1 capsule (60 mg total) by mouth 2 (two) times daily. 180 capsule 2   ezetimibe  (ZETIA ) 10 MG tablet Take 1 tablet (10 mg total) by mouth daily. 90 tablet 1   fexofenadine  (ALLEGRA ) 60 MG tablet Take 1 tablet (60 mg total) by mouth daily. 90 tablet 2   hydrochlorothiazide  (HYDRODIURIL ) 12.5 MG tablet Take 1 tablet (12.5 mg total) by mouth daily. 90 tablet 2   isosorbide  mononitrate (IMDUR ) 60 MG 24 hr tablet Take 2 tablets (120 mg total) by mouth daily. 180 tablet 1   LORazepam  (ATIVAN ) 1 MG tablet Take 1 tablet (1 mg total) by mouth every 8 (eight) hours as needed for anxiety. 90 tablet 0   meclizine  (ANTIVERT ) 25 MG tablet Take 1 tablet (25 mg total) by mouth 3 (three) times daily as needed for dizziness. 90 tablet 2   metoprolol  succinate (TOPROL -XL) 50 MG 24 hr tablet Take 50 mg by mouth daily. Take with or immediately following a meal.     Multiple Vitamin (MULTIVITAMIN WITH MINERALS) TABS tablet Take 1 tablet by mouth daily. Men's Multivitamin     nitroGLYCERIN  (NITROSTAT ) 0.4 MG SL tablet Dissolve 1 tablet (0.4 mg total) under the tongue every 5 (five) minutes up to 3 doses as needed for chest pain. If no relief after 3 doses call 911. 25 tablet 1   ondansetron  (ZOFRAN ) 4 MG tablet Take 1 tablet (4 mg total) by mouth every 8 (eight) hours as needed for nausea or vomiting. 20  tablet 0   pantoprazole  (PROTONIX ) 40 MG tablet Take 1 tablet (40 mg total) by mouth daily. 90 tablet 1   ranolazine  (RANEXA ) 1000 MG SR tablet Take 1 tablet by mouth 2 times daily. 180 tablet 2   rosuvastatin  (CRESTOR ) 40 MG tablet Take 1 tablet (40 mg total) by mouth daily. (Patient taking differently: Take 40 mg by mouth 2 (two) times daily.) 90 tablet 1   Solriamfetol  HCl (SUNOSI )  150 MG TABS Take 1 tablet (150 mg total) by mouth every morning. 30 tablet 0   [DISCONTINUED] diltiazem  (CARDIZEM  SR) 60 MG 12 hr capsule Take 1 capsule (60 mg total) by mouth 2 (two) times daily. 180 capsule 2   [DISCONTINUED] pantoprazole  (PROTONIX ) 40 MG tablet Take 1 tablet (40 mg total) by mouth daily. 30 tablet 3   [DISCONTINUED] triamterene -hydrochlorothiazide  (DYAZIDE) 37.5-25 MG capsule Take 1 each (1 capsule total) by mouth daily. 90 capsule 2   No current facility-administered medications on file prior to visit.   Past Medical History:  Diagnosis Date   Abnormal EKG 06/13/2023   Abnormal stress test small area of mild ischemia inferior wall 05/24/2022   Aphasia 06/13/2023   Arthropathy of lumbar facet joint 07/03/2023   Bilateral sacroiliitis (HCC) 08/29/2023   Bipolar 1 disorder 02/04/2023   Chronic bilateral low back pain with right-sided sciatica 06/24/2023   Chronic headaches 01/30/2021   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   Coronary artery disease involving coronary bypass graft of native heart with angina pectoris 05/22/2016   Cath 2013 LAD 95% stenosis, D1 90% stenosis, RCA 20%, Circ 30%. Previous stent to D1 2012. EF 60%.   Degeneration of lumbar intervertebral disc 07/03/2023   Dizziness 10/27/2020   Excessive daytime sleepiness 09/11/2016   Gastroesophageal reflux disease 02/04/2023   Generalized anxiety disorder 05/22/2016   Herpes zoster 01/20/2020   History of small bowel obstruction    Admitted  10-14-2007 for partical small bowel obstruction and N.G. tube was placed. Admitted by Dr Maple Seltzer. 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 Small internal hemorrhoids. Otherwise normal colonoscopy to terminal ileum   CORONARY ARTERY BYPASS GRAFT  02/19/2012   L - LAD, SVG - D1, SVG - OM   CORONARY STENT INTERVENTION N/A 11/05/2019   Procedure: CORONARY STENT INTERVENTION;  Surgeon: Avanell Leigh, MD;  Location: [facility];  Service: Cardiovascular;  Laterality: N/A;  SVG to DIAG   ESOPHAGOGASTRODUODENOSCOPY  01/08/2007   Multiple duodenal ulcers with stenosis/stricture of the distal first portion of the duodenum (measuring approximantely 9mm) Mild gastritis.   LEFT HEART CATH AND CORS/GRAFTS ANGIOGRAPHY N/A 11/05/2019   Procedure: LEFT HEART CATH AND CORS/GRAFTS ANGIOGRAPHY;  Surgeon: Avanell Leigh, MD;  Location: [facility];  Service: Cardiovascular;  Laterality: N/A;   LEFT HEART CATH AND CORS/GRAFTS ANGIOGRAPHY N/A 08/29/2023   Procedure: LEFT HEART CATH AND  CORS/GRAFTS ANGIOGRAPHY;  Surgeon: Odie Benne, MD;  Location: [facility];  Service: Cardiovascular;  Laterality: N/A;   LUMBAR LAMINECTOMY/DECOMPRESSION MICRODISCECTOMY Left 02/14/2024   Procedure: LUMBAR LAMINECTOMY/DECOMPRESSION MICRODISCECTOMY LEFT LUMBAR TWO-LUMBAR THREE;  Surgeon: Augusto Blonder, MD;  Location: [facility] OR;  Service: Neurosurgery;  Laterality: Left;  MICRODISCECTOMY, LT, L23   RENAL ANGIOGRAPHY N/A 11/05/2019   Procedure: RENAL ANGIOGRAPHY;  Surgeon: Avanell Leigh, MD;  Location: [facility];  Service: Cardiovascular;  Laterality: N/A;   TONSILLECTOMY AND ADENOIDECTOMY     WRIST SURGERY Right     Family History  Problem Relation Age of Onset   Aneurysm Mother 42       cerebral   CAD Father 61   Heart disease Sister        Unclear   Healthy Brother    Dementia Other        several individuals on extended paternal side of family   Social History   Socioeconomic History   Marital status: Married    Spouse name: Not on file   Number of children: Not on file   Years of education: 16   Highest education level: Bachelor's degree (e.g., BA, AB, BS)  Occupational History   Occupation: Oncologist: [employer]  Tobacco Use   Smoking status: Never   Smokeless tobacco: Never  Vaping Use   Vaping status: Never Used  Substance and Sexual Activity   Alcohol use: Yes    Alcohol/week: 14.0 standard drinks of alcohol    Types: 14 Cans of beer per week    Comment: ~ 2 beers nightly   Drug use: Never   Sexual activity: Not on file  Other Topics Concern   Not on file  Social History Narrative   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        Right Handed   Lives in two story home    Lives with wife   2 step children'   Social Drivers of Health   Financial Resource Strain: Low Risk  (06/04/2022)   Overall Financial Resource Strain (CARDIA)    Difficulty of Paying  Living Expenses: Not hard at all  Food Insecurity: No Food Insecurity (06/04/2022)   Hunger Vital Sign    Worried About Running Out of Food in the Last Year: Never true    Ran Out of Food in the Last Year: Never true  Transportation Needs: No Transportation Needs (06/04/2022)   PRAPARE - Administrator, Civil Service (Medical): No    Lack of Transportation (Non-Medical): No  Physical Activity: Inactive (06/04/2022)   Exercise Vital Sign    Days of Exercise per Week: 0 days    Minutes of Exercise per Session: 0 min  Stress: No Stress Concern Present (06/04/2022)   [employer] of Occupational Health - Occupational Stress Questionnaire    Feeling of Stress : Not at all  Social Connections: Moderately Integrated (06/04/2022)   Social Connection and Isolation Panel [NHANES]    Frequency of Communication with Friends and Family: More than three times a week    Frequency of Social Gatherings with Friends and Family: More than three times a week    Attends Religious Services: Never    Database administrator or Organizations: Yes    Attends Engineer, structural: More than 4 times per year    Marital Status: Married    Objective:  PHYSICAL EXAM:   VS: BP 102/68   Pulse 73   Temp  98.2 F (36.8 C) (Temporal)   Resp 18   Ht 5\' 10"  (1.778 m)   Wt 177 lb (80.3 kg)   SpO2 98%   BMI 25.40 kg/m   GEN: Well nourished, well developed, in no acute distress   Cardiac: RRR; no murmurs, rubs, or gallops,no edema - Respiratory:  normal respiratory rate and pattern with no distress - normal breath sounds with no rales, rhonchi, wheezes or rubs GI: normal bowel sounds, no masses or tenderness MS: no deformity or atrophy  Skin: warm and dry, no rash  Neuro:  Alert and Oriented x 3, - CN II-Xii grossly intact Psych: euthymic mood, appropriate affect and demeanor    Lab Results  Component Value Date   WBC 4.0 02/03/2024   HGB 14.1 02/03/2024   HCT 41.7 02/03/2024   PLT  122 (L) 02/03/2024   GLUCOSE 114 (H) 02/03/2024   CHOL 131 11/26/2023   TRIG 55 11/26/2023   HDL 65 11/26/2023   LDLCALC 54 11/26/2023   ALT 32 11/26/2023   AST 28 11/26/2023   NA 138 02/03/2024   K 4.1 02/03/2024   CL 101 02/03/2024   CREATININE 1.03 02/03/2024   BUN 13 02/03/2024   CO2 26 02/03/2024   TSH 0.616 11/26/2023   INR 1.0 10/03/2023   HGBA1C 5.3 12/02/2023      Assessment & Plan:    Coronary artery disease of native artery of native heart with stable angina pectoris Continue meds and follow up with cardiology as directed GAD (generalized anxiety disorder) Continue current meds Severe episode of recurrent major depressive disorder, without psychotic features Continue meds Follow up with psychiatry as directed Vertigo Continue meclizine  prn  Gastroesophageal reflux disease, unspecified whether esophagitis present Continue protonix  40mg  Primary hypertension -     CBC with Differential/Platelet; Future -     Comprehensive metabolic panel; Future -     TSH; Future   Hypercholesterolemia -     Lipid panel; Future Continue meds Watch diet Bipolar 1 disorder Continue current meds Follow up with psychiatry Need pneumo vaccine Pneumo 20 given  Excessive daytime sleepiness Follow up with neurology as directed  Colon cancer screening -     Ambulatory referral to Gastroenterology     No orders of the defined types were placed in this encounter.   Orders Placed This Encounter  Procedures   Pneumococcal conjugate vaccine 20-valent (Prevnar 20)   CBC with Differential/Platelet   Comprehensive metabolic panel with GFR   Hemoglobin A1c   Lipid panel   TSH   Ambulatory referral to Gastroenterology     Follow-up: Return in about 6 months (around 08/30/2024) for chronic fasting follow-up.   I,SARA R Aeric Burnham,acting as a scribe for [employer] R Irini Leet, PA-C.,have documented all relevant documentation on the behalf of SARA R Halbert Jesson, PA-C,as directed by  SARA R  Lakeyia Surber, PA-C while in the presence of SARA R Delena Casebeer, PA-C.   An After Visit Summary was printed and given to the patient.  Anthonette Bastos [facility] [phone]

## 2024-02-29 ENCOUNTER — Other Ambulatory Visit: Payer: Self-pay | Admitting: Physician Assistant

## 2024-02-29 ENCOUNTER — Other Ambulatory Visit (HOSPITAL_BASED_OUTPATIENT_CLINIC_OR_DEPARTMENT_OTHER): Payer: Self-pay

## 2024-02-29 ENCOUNTER — Other Ambulatory Visit (HOSPITAL_COMMUNITY): Payer: Self-pay

## 2024-02-29 DIAGNOSIS — I2510 Atherosclerotic heart disease of native coronary artery without angina pectoris: Secondary | ICD-10-CM

## 2024-03-02 ENCOUNTER — Other Ambulatory Visit: Payer: Self-pay | Admitting: Physician Assistant

## 2024-03-02 ENCOUNTER — Other Ambulatory Visit: Payer: Self-pay | Admitting: Neurology

## 2024-03-02 ENCOUNTER — Ambulatory Visit: Admitting: Psychiatry

## 2024-03-02 ENCOUNTER — Other Ambulatory Visit (HOSPITAL_COMMUNITY): Payer: Self-pay

## 2024-03-02 ENCOUNTER — Other Ambulatory Visit (HOSPITAL_BASED_OUTPATIENT_CLINIC_OR_DEPARTMENT_OTHER): Payer: Self-pay

## 2024-03-02 ENCOUNTER — Encounter: Payer: Self-pay | Admitting: Psychiatry

## 2024-03-02 ENCOUNTER — Other Ambulatory Visit: Payer: Self-pay

## 2024-03-02 DIAGNOSIS — F4001 Agoraphobia with panic disorder: Secondary | ICD-10-CM

## 2024-03-02 DIAGNOSIS — F314 Bipolar disorder, current episode depressed, severe, without psychotic features: Secondary | ICD-10-CM

## 2024-03-02 DIAGNOSIS — R4189 Other symptoms and signs involving cognitive functions and awareness: Secondary | ICD-10-CM | POA: Diagnosis not present

## 2024-03-02 DIAGNOSIS — F411 Generalized anxiety disorder: Secondary | ICD-10-CM

## 2024-03-02 MED ORDER — METOPROLOL SUCCINATE ER 50 MG PO TB24
50.0000 mg | ORAL_TABLET | Freq: Every day | ORAL | 5 refills | Status: DC
Start: 2024-03-13 — End: 2024-03-02
  Filled 2024-03-02: qty 30, 30d supply, fill #0

## 2024-03-02 MED ORDER — CARIPRAZINE HCL 3 MG PO CAPS
3.0000 mg | ORAL_CAPSULE | Freq: Every day | ORAL | 0 refills | Status: DC
  Filled 2024-03-02: qty 90, 90d supply, fill #0
  Filled 2024-03-16: qty 30, 30d supply, fill #0
  Filled 2024-04-09: qty 30, 30d supply, fill #1
  Filled 2024-05-05 – 2024-05-06 (×2): qty 30, 30d supply, fill #2

## 2024-03-02 MED ORDER — METOPROLOL SUCCINATE ER 50 MG PO TB24
50.0000 mg | ORAL_TABLET | Freq: Every day | ORAL | 1 refills | Status: DC
  Filled 2024-03-02: qty 90, 90d supply, fill #0
  Filled 2024-03-10 – 2024-03-16 (×2): qty 30, 30d supply, fill #0
  Filled 2024-04-09: qty 30, 30d supply, fill #1
  Filled 2024-05-05: qty 30, 30d supply, fill #2
  Filled 2024-05-06: qty 30, 30d supply, fill #0
  Filled 2024-06-02: qty 30, 30d supply, fill #1
  Filled 2024-07-07: qty 30, 30d supply, fill #2
  Filled 2024-08-03 – 2024-08-10 (×2): qty 30, 30d supply, fill #3
  Filled ????-??-??: fill #0

## 2024-03-02 MED ORDER — DULOXETINE HCL 60 MG PO CPEP
60.0000 mg | ORAL_CAPSULE | Freq: Two times a day (BID) | ORAL | 1 refills | Status: AC
  Filled 2024-03-02: qty 180, 90d supply, fill #0
  Filled 2024-06-02: qty 60, 30d supply, fill #0
  Filled 2024-07-07: qty 60, 30d supply, fill #1
  Filled 2024-08-03 – 2024-08-12 (×3): qty 60, 30d supply, fill #2
  Filled 2024-09-01 – 2024-09-09 (×3): qty 60, 30d supply, fill #3
  Filled 2024-10-01 – 2024-10-06 (×3): qty 60, 30d supply, fill #4
  Filled 2024-11-03 – 2024-11-04 (×2): qty 60, 30d supply, fill #5

## 2024-03-02 MED ORDER — METOPROLOL SUCCINATE ER 50 MG PO TB24
50.0000 mg | ORAL_TABLET | Freq: Every day | ORAL | 0 refills | Status: DC
  Filled 2024-03-02 (×2): qty 15, 15d supply, fill #0

## 2024-03-02 NOTE — Progress Notes (Signed)
 Jon Gonzales [id number] 11/02/1960 63 y.o.  Subjective:   Patient ID:  Jon Gonzales is a 63 y.o. (DOB 1961-10-13) male.  Chief Complaint:  Chief Complaint  Patient presents with   Follow-up   Depression   Anxiety    HPI Jon Gonzales presents to the office today for follow-up of bipolar 1, generalized anxiety disorder, panic disorder, pseudodementia.  First seen in this office 06/21/2023.  He was encouraged to stop cyclobenzaprine  to see if concentration and memory might be better and start Vraylar  1.5 mg for 10 days and if no improvement in depression increase to 3 mg daily.  Wife is Dr. Starling Eck Gulas who reports that his depression and anxiety are improved but his memory is not.  She notes resolution of suicidal ideation. He agrees with wife's observation.   He says maybe back is a little worse.  Is off cyclobenzaprine .  Psych meds:  duloxetine  120 mg daily, lithium  300 BID, lorazepam  1 mg BID and prn, Vraylar  3 mg daily. No SE known.   Vraylar  seemed to work pretty good.  Less anxious and keyed up.  Dep is lighter.  Dep is 3/10 and was 8/10 before.  No SI.  Definitely a change.   Still having the memory problems.  Not sure if he's on Robaxin.  Didn't recognize he had an appt until he got here today.   No problems with work.  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 To help sleep takes OTC , melatonin.   Sleep good usually.  No Sleep apnea and neg sleep study per Dr. Sinda Duel. No recent panic or Meniere's attacks lately.   Plan: (NAC) N-Acetylcysteine 2 of the  600 mg capsules daily to help with mild cognitive problems.  Try reducing the lorazepam  morning dose only to 1/2 tablet to see if memory is any better.     09/19/23 appt noted: Psych med:  duloxetine  120 mg daily, lithium  300 BID(out for a week), lorazepam  1 mg BID and prn, Vraylar  3 mg daily.  Forgot about NAC Didn't try reducing lorazepam  .  Was nervous about doing so. He thinks he's doing  pretty well.  Dep is under control.   Anxiety is still there but maybe not as bad. Function pretty good aside from back pain.   Sleep well. Enjoyed T'giving with sister in [place].  03/02/24 appt noted: Back surgery 2 week ago and pain better than it was. Med: duloxetine  60 mg daily,  no lithium , lorazepam  1 mg prn (avg 1 daily), Vraylar  3 mg daily.   Thinks Vraylar  "a world of difference" . Would like to increase duloxetine  bc seemed to help mood and anxiety more.   No SE noted. Will feel stressed out and tense and on edge if misses meds. Working to switch meds to pill pack.  M died when 63 yo.  So M's day is downer.   Mood overall is ok with depression. Dont' worry about job like he did. No mood swings  Past Psychiatric Medication Trials:   Wellbutrin  XL 150 ? Anxiety Antidepressants tried around 2008 2012 include sertraline, Lexapro headache, Effexor XR, Pristiq.,  Nefazodone 50 mg twice daily, Trintellix for 2 months 2014 with headache Duloxetine  120 mg daily Clonazepam December 2020 to January 2021, lorazepam  for years Lamotrigine for 2 months 2012, topiramate, Trileptal briefly 2018 Latuda 5 months 2018 with no apparent response and questionable dose Vraylar  3 mg daily August 2000 01 July 2016 Olanzapine 15 1  month September 2017 Buspirone 30 twice daily no response Mirtazapine May to December 2018 Ambien, Lunesta  Cyproheptadine  No history bipolar but 2017 onset mania with rapid speech and drivenness and paranoia. No further manic sx   GAD-7    Flowsheet Row Office Visit from 02/28/2024 in [facility] Office Visit from 06/11/2023 in [facility] Office Visit from 04/18/2022 in [facility]  Total GAD-7 Score 6 6 14       PHQ2-9    Flowsheet Row Office Visit from 02/28/2024 in [facility] Office Visit from 06/11/2023 in [facility] Office Visit from 04/18/2022 in [facility] Office Visit from 01/17/2022 in [facility]  PHQ-2 Total Score 3 3 6 5   PHQ-9 Total Score 10 9 20 16       Flowsheet Row Admission (Discharged) from 02/14/2024 in [place] ED from 10/03/2023 in [facility] Emergency Department at [facility] ED to Hosp-Admission (Discharged) from 08/29/2023 in [facility] CARDIAC CATH LAB  C-SSRS RISK CATEGORY No Risk No Risk No Risk        Review of Systems:  Review of Systems  Cardiovascular:  Negative for chest pain and palpitations.  Musculoskeletal:  Positive for back pain.  Psychiatric/Behavioral:  Negative for dysphoric mood and suicidal ideas. The patient is nervous/anxious.     Medications: I have reviewed the patient's current medications.  Current Outpatient Medications  Medication Sig Dispense Refill   acetaminophen  (TYLENOL ) 500 MG tablet Take 500-1,000 mg by mouth every 6 (six) hours as needed for moderate pain (pain score 4-6).     aspirin  EC 81 MG tablet Take 1 tablet (81 mg total) by mouth daily. 90 tablet 2   clopidogrel  (PLAVIX ) 75 MG tablet Take 1 tablet (75 mg total) by mouth daily. 90 tablet 1   cyclobenzaprine  (FLEXERIL ) 10 MG tablet Take 1 tablet (10 mg total) by mouth 3 (three) times daily as needed for muscle spasms. 30 tablet 0   ezetimibe  (ZETIA ) 10 MG tablet Take 1 tablet (10 mg total) by mouth daily. 90 tablet 1   fexofenadine  (ALLEGRA ) 60 MG tablet Take 1 tablet (60 mg total) by mouth daily. 90 tablet 2   hydrochlorothiazide  (HYDRODIURIL ) 12.5 MG tablet Take 1 tablet (12.5 mg total) by mouth daily. 90 tablet 2   isosorbide  mononitrate (IMDUR ) 60 MG 24 hr tablet Take 2 tablets (120 mg total) by mouth daily. 180 tablet 1   LORazepam  (ATIVAN ) 1 MG tablet Take 1 tablet (1 mg total) by mouth every 8 (eight) hours as needed for anxiety. 90 tablet 0   meclizine  (ANTIVERT ) 25 MG tablet Take 1 tablet (25 mg total) by mouth 3 (three) times  daily as needed for dizziness. 90 tablet 2   [START ON 03/13/2024] metoprolol  succinate (TOPROL -XL) 50 MG 24 hr tablet Take 1 tablet (50 mg total) by mouth daily. Take with or immediately following a meal. 15 tablet 0   Multiple Vitamin (MULTIVITAMIN WITH MINERALS) TABS tablet Take 1 tablet by mouth daily. Men's Multivitamin     nitroGLYCERIN  (NITROSTAT ) 0.4 MG SL tablet Dissolve 1 tablet (0.4 mg total) under the tongue every 5 (five) minutes up to 3 doses as needed for chest pain. If no relief after 3 doses call 911. 25 tablet 1   ondansetron  (ZOFRAN ) 4 MG tablet Take 1 tablet (4 mg total) by mouth every 8 (eight) hours  as needed for nausea or vomiting. 20 tablet 0   pantoprazole  (PROTONIX ) 40 MG tablet Take 1 tablet (40 mg total) by mouth daily. 90 tablet 1   ranolazine  (RANEXA ) 1000 MG SR tablet Take 1 tablet by mouth 2 times daily. 180 tablet 2   rosuvastatin  (CRESTOR ) 40 MG tablet Take 1 tablet (40 mg total) by mouth daily. (Patient taking differently: Take 40 mg by mouth 2 (two) times daily.) 90 tablet 1   Solriamfetol  HCl (SUNOSI ) 150 MG TABS Take 1 tablet (150 mg total) by mouth every morning. 30 tablet 0   cariprazine  (VRAYLAR ) 3 MG capsule Take 1 capsule (3 mg total) by mouth daily. 90 capsule 0   DULoxetine  (CYMBALTA ) 60 MG capsule Take 1 capsule (60 mg total) by mouth 2 (two) times daily. 180 capsule 1   No current facility-administered medications for this visit.    Medication Side Effects: None  Allergies:  Allergies  Allergen Reactions   Trintellix [Vortioxetine] Other (See Comments)    Headaches.   Penicillins Rash    Past Medical History:  Diagnosis Date   Abnormal EKG 06/13/2023   Abnormal stress test small area of mild ischemia inferior wall 05/24/2022   Aphasia 06/13/2023   Arthropathy of lumbar facet joint 07/03/2023   Bilateral sacroiliitis (HCC) 08/29/2023   Bipolar 1 disorder 02/04/2023   Chronic bilateral low back pain with right-sided sciatica 06/24/2023    Chronic headaches 01/30/2021   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   Coronary artery disease involving coronary bypass graft of native heart with angina pectoris 05/22/2016   Cath 2013 LAD 95% stenosis, D1 90% stenosis, RCA 20%, Circ 30%. Previous stent to D1 2012. EF 60%.   Degeneration of lumbar intervertebral disc 07/03/2023   Dizziness 10/27/2020   Excessive daytime sleepiness 09/11/2016   Gastroesophageal reflux disease 02/04/2023   Generalized anxiety disorder 05/22/2016   Herpes zoster 01/20/2020   History of small bowel obstruction    Admitted 10-14-2007 for partical small bowel obstruction and N.G. tube was placed. Admitted by Dr Maple Seltzer. 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   Please see review of systems for further details on the patient's review from today.   Objective:   Physical Exam:  There were no vitals taken for this visit.  Physical Exam Constitutional:      General: He is not  in acute distress.    Appearance: He is well-developed.  Musculoskeletal:        General: No deformity.  Neurological:     Mental Status: He is alert and  oriented to person, place, and time.     Coordination: Coordination normal.  Psychiatric:        Attention and Perception: Attention and perception normal.        Mood and Affect: Mood is anxious. Mood is not depressed. Affect is not labile, blunt, angry or inappropriate.        Speech: Speech normal.        Behavior: Behavior normal.        Thought Content: Thought content normal. Thought content is not delusional. Thought content does not include homicidal or suicidal ideation. Thought content does not include suicidal plan.        Cognition and Memory: He exhibits impaired recent memory.        Judgment: Judgment normal.     Comments: Less dep and memory concerns today     Lab Review:     Component Value Date/Time   NA 138 02/03/2024 1230   NA 140 11/26/2023 1051   K 4.1 02/03/2024 1230   CL 101 02/03/2024 1230   CO2 26 02/03/2024 1230   GLUCOSE 114 (H) 02/03/2024 1230   BUN 13 02/03/2024 1230   BUN 19 11/26/2023 1051   CREATININE 1.03 02/03/2024 1230   CALCIUM  9.4 02/03/2024 1230   PROT 6.6 11/26/2023 1051   ALBUMIN 4.4 11/26/2023 1051   AST 28 11/26/2023 1051   ALT 32 11/26/2023 1051   ALKPHOS 49 11/26/2023 1051   BILITOT 0.6 11/26/2023 1051   GFRNONAA >60 02/03/2024 1230   GFRAA 77 11/15/2020 0809       Component Value Date/Time   WBC 4.0 02/03/2024 1230   RBC 4.13 (L) 02/03/2024 1230   HGB 14.1 02/03/2024 1230   HGB 13.9 12/02/2023 1517   HCT 41.7 02/03/2024 1230   HCT 39.7 12/02/2023 1517   PLT 122 (L) 02/03/2024 1230   PLT 159 12/02/2023 1517   MCV 101.0 (H) 02/03/2024 1230   MCV 101 (H) 12/02/2023 1517   MCH 34.1 (H) 02/03/2024 1230   MCHC 33.8 02/03/2024 1230   RDW 12.0 02/03/2024 1230   RDW 12.8 12/02/2023 1517   LYMPHSABS 0.8 12/02/2023 1517   MONOABS 0.7 10/03/2023 1326   EOSABS 0.2  12/02/2023 1517   BASOSABS 0.0 12/02/2023 1517    Lithium  Lvl  Date Value Ref Range Status  12/26/2023 0.8 0.5 - 1.2 mmol/L Final    Comment:    A concentration of 0.5-0.8 mmol/L is advised for long-term use; concentrations of up to 1.2 mmol/L may be necessary during acute treatment.                                  Detection Limit = 0.1                           <0.1 indicates None Detected    02/05/23 lithium  level 0.9 stable sine 2021  05/31/23 D 51, B12 786, TSH 3.86 08/30/23 EKG normal QTc  .res Assessment: Plan:    Maurie Southern" was seen today for follow-up, depression and anxiety.  Diagnoses and all orders for this visit:  Bipolar I disorder with depression, severe (HCC) -     DULoxetine  (CYMBALTA ) 60 MG capsule; Take 1 capsule (60 mg total) by mouth 2 (two) times daily. -     cariprazine  (VRAYLAR ) 3 MG capsule; Take 1 capsule (3 mg total) by mouth daily.  Generalized anxiety disorder -     DULoxetine  (CYMBALTA ) 60 MG capsule; Take 1 capsule (60 mg total) by mouth 2 (two) times daily.  Panic disorder with agoraphobia -     DULoxetine  (CYMBALTA ) 60 MG capsule; Take 1 capsule (60 mg total) by mouth 2 (two) times daily.  Pseudodementia  30 min appt.  Disc dx with hx cog, bipolar, anxiety issues.  Disc dx.   Pleased with Vraylar  for mood.   Wants resume duloxetine  60 BID.  Disc SE risk in combo with Sunosi  from neuro.  Call if a problem.   He is less depressed with changes but still some memory concerns and residual dep perhaps.  We discussed the short-term risks associated with benzodiazepines including sedation and increased fall risk among others.  Discussed long-term side effect risk including dependence, potential withdrawal symptoms, and the potential eventual dose-related risk of dementia.  But recent studies from 2020 dispute this association between benzodiazepines and dementia risk. Newer studies in 2020 do not support an association with dementia. Rec try  reducing AM lorazepam  to see if memory is better.  Lab workup memory px per neuro was unremarkable:  05/31/23 D 51, B12 786, TSH 3.86  Stopped lithium  no prolem off it.  Disc mania risk but Vraylar  should prevent.  Discussed potential metabolic side effects associated with atypical antipsychotics, as well as potential risk for movement side effects. Advised pt to contact office if movement side effects occur.   Continue other meds for now including Vraylar  3, duloxetine  120, llorazepam except the following:    (NAC) N-Acetylcysteine 2 of the  600 mg capsules daily to help with mild cognitive problems.   Would like to increase duloxetine  back to 60 mg BID .  Ok for additonal mood and anxiety effects.  Rec resume B12 DT hx cog complaints and DT macrocytic RBC even though last B12 level was normal.  He's taken it on and off over time.  Option  Try reducing the lorazepam  morning dose only to 1/2 tablet to see if memory is any better.   He's reluctant  FU 8 weeks.  Jon Beat, MD, DFAPA   Please see After Visit Summary for patient specific instructions.  Future Appointments  Date Time Provider Department Center  03/05/2024  2:30 PM Dohmeier, Raoul Byes, MD [facility] None  04/19/2024  8:00 PM [facility] None  04/20/2024  7:00 AM [facility] None    No orders of the defined types were placed in this encounter.   -------------------------------

## 2024-03-03 ENCOUNTER — Other Ambulatory Visit: Payer: Self-pay

## 2024-03-03 DIAGNOSIS — I10 Essential (primary) hypertension: Secondary | ICD-10-CM

## 2024-03-03 DIAGNOSIS — R739 Hyperglycemia, unspecified: Secondary | ICD-10-CM

## 2024-03-03 DIAGNOSIS — F411 Generalized anxiety disorder: Secondary | ICD-10-CM

## 2024-03-03 DIAGNOSIS — I2510 Atherosclerotic heart disease of native coronary artery without angina pectoris: Secondary | ICD-10-CM | POA: Diagnosis not present

## 2024-03-04 ENCOUNTER — Other Ambulatory Visit (HOSPITAL_BASED_OUTPATIENT_CLINIC_OR_DEPARTMENT_OTHER): Payer: Self-pay

## 2024-03-04 ENCOUNTER — Other Ambulatory Visit: Payer: Self-pay | Admitting: Physician Assistant

## 2024-03-04 ENCOUNTER — Other Ambulatory Visit: Payer: Self-pay

## 2024-03-04 ENCOUNTER — Other Ambulatory Visit (HOSPITAL_COMMUNITY): Payer: Self-pay

## 2024-03-04 ENCOUNTER — Ambulatory Visit: Payer: Self-pay | Admitting: Physician Assistant

## 2024-03-04 DIAGNOSIS — R899 Unspecified abnormal finding in specimens from other organs, systems and tissues: Secondary | ICD-10-CM

## 2024-03-04 LAB — LIPID PANEL
Chol/HDL Ratio: 2.3 ratio (ref 0.0–5.0)
Cholesterol, Total: 127 mg/dL (ref 100–199)
HDL: 55 mg/dL (ref >39)
LDL Chol Calc (NIH): 54 mg/dL (ref 0–99)
Triglycerides: 97 mg/dL (ref 0–149)
VLDL Cholesterol Cal: 18 mg/dL (ref 5–40)

## 2024-03-04 LAB — COMPREHENSIVE METABOLIC PANEL WITH GFR
ALT: 17 IU/L (ref 0–44)
AST: 18 IU/L (ref 0–40)
Albumin: 4.2 g/dL (ref 3.9–4.9)
Alkaline Phosphatase: 51 IU/L (ref 44–121)
BUN/Creatinine Ratio: 14 (ref 10–24)
BUN: 14 mg/dL (ref 8–27)
Bilirubin Total: 0.6 mg/dL (ref 0.0–1.2)
CO2: 21 mmol/L (ref 20–29)
Calcium: 8.8 mg/dL (ref 8.6–10.2)
Chloride: 104 mmol/L (ref 96–106)
Creatinine, Ser: 1.02 mg/dL (ref 0.76–1.27)
Globulin, Total: 2.4 g/dL (ref 1.5–4.5)
Glucose: 96 mg/dL (ref 70–99)
Potassium: 4 mmol/L (ref 3.5–5.2)
Sodium: 142 mmol/L (ref 134–144)
Total Protein: 6.6 g/dL (ref 6.0–8.5)
eGFR: 83 mL/min/{1.73_m2} (ref >59)

## 2024-03-04 LAB — CBC WITH DIFFERENTIAL/PLATELET
Basophils Absolute: 0 10*3/uL (ref 0.0–0.2)
Basos: 1 %
EOS (ABSOLUTE): 0.2 10*3/uL (ref 0.0–0.4)
Eos: 5 %
Hematocrit: 38.7 % (ref 37.5–51.0)
Hemoglobin: 13.2 g/dL (ref 13.0–17.7)
Immature Grans (Abs): 0 10*3/uL (ref 0.0–0.1)
Immature Granulocytes: 0 %
Lymphocytes Absolute: 1.5 10*3/uL (ref 0.7–3.1)
Lymphs: 34 %
MCH: 34.3 pg — ABNORMAL HIGH (ref 26.6–33.0)
MCHC: 34.1 g/dL (ref 31.5–35.7)
MCV: 101 fL — ABNORMAL HIGH (ref 79–97)
Monocytes Absolute: 0.4 10*3/uL (ref 0.1–0.9)
Monocytes: 9 %
Neutrophils Absolute: 2.2 10*3/uL (ref 1.4–7.0)
Neutrophils: 51 %
Platelets: 124 10*3/uL — ABNORMAL LOW (ref 150–450)
RBC: 3.85 x10E6/uL — ABNORMAL LOW (ref 4.14–5.80)
RDW: 12.5 % (ref 11.6–15.4)
WBC: 4.2 10*3/uL (ref 3.4–10.8)

## 2024-03-04 LAB — HEMOGLOBIN A1C
Est. average glucose Bld gHb Est-mCnc: 103 mg/dL
Hgb A1c MFr Bld: 5.2 % (ref 4.8–5.6)

## 2024-03-04 LAB — TSH: TSH: 0.665 u[IU]/mL (ref 0.450–4.500)

## 2024-03-04 MED ORDER — SUNOSI 150 MG PO TABS
150.0000 mg | ORAL_TABLET | ORAL | 1 refills | Status: DC
  Filled 2024-03-04: qty 90, 90d supply, fill #0
  Filled 2024-04-13 – 2024-06-07 (×2): qty 90, 90d supply, fill #1

## 2024-03-04 NOTE — Telephone Encounter (Signed)
 Last seen 11/07/23 and next f/u 03/05/24. Last refilled 02/04/24 #30.

## 2024-03-05 ENCOUNTER — Ambulatory Visit: Payer: 59 | Admitting: Neurology

## 2024-03-05 ENCOUNTER — Other Ambulatory Visit (HOSPITAL_BASED_OUTPATIENT_CLINIC_OR_DEPARTMENT_OTHER): Payer: Self-pay

## 2024-03-05 ENCOUNTER — Telehealth: Payer: Self-pay | Admitting: Neurology

## 2024-03-05 ENCOUNTER — Other Ambulatory Visit (HOSPITAL_COMMUNITY): Payer: Self-pay

## 2024-03-05 NOTE — Telephone Encounter (Signed)
 request to cancel appointment Will call back to r/s

## 2024-03-10 ENCOUNTER — Other Ambulatory Visit: Payer: Self-pay

## 2024-03-10 ENCOUNTER — Other Ambulatory Visit (HOSPITAL_COMMUNITY): Payer: Self-pay

## 2024-03-12 ENCOUNTER — Other Ambulatory Visit: Payer: Self-pay

## 2024-03-13 ENCOUNTER — Other Ambulatory Visit: Payer: Self-pay

## 2024-03-16 ENCOUNTER — Other Ambulatory Visit: Payer: Self-pay

## 2024-03-16 MED ORDER — HYDROCHLOROTHIAZIDE 12.5 MG PO TABS
12.5000 mg | ORAL_TABLET | Freq: Every day | ORAL | 11 refills | Status: DC
  Filled 2024-03-16: qty 30, 30d supply, fill #0
  Filled 2024-04-09: qty 30, 30d supply, fill #1
  Filled 2024-05-05 – 2024-05-06 (×2): qty 30, 30d supply, fill #2
  Filled 2024-06-02: qty 30, 30d supply, fill #3
  Filled 2024-07-07: qty 30, 30d supply, fill #4
  Filled 2024-08-03: qty 30, 30d supply, fill #5

## 2024-03-18 ENCOUNTER — Ambulatory Visit (INDEPENDENT_AMBULATORY_CARE_PROVIDER_SITE_OTHER): Admitting: Physician Assistant

## 2024-03-18 ENCOUNTER — Encounter: Payer: Self-pay | Admitting: Physician Assistant

## 2024-03-18 ENCOUNTER — Ambulatory Visit: Admitting: Physician Assistant

## 2024-03-18 VITALS — BP 100/66 | HR 75 | Temp 98.0°F | Resp 18 | Ht 70.0 in | Wt 178.0 lb

## 2024-03-18 DIAGNOSIS — F411 Generalized anxiety disorder: Secondary | ICD-10-CM | POA: Diagnosis not present

## 2024-03-18 DIAGNOSIS — H9313 Tinnitus, bilateral: Secondary | ICD-10-CM | POA: Diagnosis not present

## 2024-03-18 DIAGNOSIS — G4719 Other hypersomnia: Secondary | ICD-10-CM

## 2024-03-18 DIAGNOSIS — R42 Dizziness and giddiness: Secondary | ICD-10-CM

## 2024-03-18 DIAGNOSIS — F314 Bipolar disorder, current episode depressed, severe, without psychotic features: Secondary | ICD-10-CM

## 2024-03-18 DIAGNOSIS — I2510 Atherosclerotic heart disease of native coronary artery without angina pectoris: Secondary | ICD-10-CM

## 2024-03-18 NOTE — Telephone Encounter (Signed)
 I am unable to get a hold of the patient to r/s his NPSG/MSLT.  I have left several mychart messaged and tried calling his phone but the voicemail box is full.

## 2024-03-18 NOTE — Progress Notes (Signed)
 Subjective:  Patient ID: Jon Gonzales, male    DOB: 24-Jan-1961  Age: 63 y.o. MRN: [id number]  Chief Complaint  Patient presents with   Anxiety    HPI Pt in today with wife Dr Mercy Stall for follow up of ongoing issues with recurrent dizziness and ringing of ears.  He states his ears ring every day and on some days worsens.  He has had multiple episodes of where the ringing gets worse , he feels dizzy and then his speech gets 'thick' . He had not had a spell in several weeks but then yesterday it happened again.  States symptoms occurred for about 5 hours and then afterward he felt drained.  He has had a prior ENT consult as well as neurology and cardiology consults.  He has had multiple scans/MRIs without any acute findings. Their concern is that he has Menieres disease but has not been given that diagnosis.  When he has these spells he does use meclizine  and zofran  and symptoms improve. They would like a referral to ENT Dr Lydia Sams for a second opinion When these spells happen patient is not able to think clearly and is too dizzy to walk without stability  Pt is following with neurology at this time for excessive sleepiness and is scheduled on 04/20/24 for a sleep study hoping to confirm diagnosis of narcolepsy.  He was placed on Sunosi  and had been doing well on that medication over the past few weeks and thinking it had helped with his spells of dizziness as well until yesterday  Pt with diagnosis of bipolar disorder with depression and anxiety which has worsened recently.  He was advised by his psychiatrist Dr Toi Foster to increase cymbalta  to bid which he does feel has been helpful.  He did have several panic attacks yesterday but realized his ativan  had inadvertantly been left out of his medication packaging and missed a few doses.  He has had harmful thoughts to himself but denies he would ever act upon them or commit suicide.    Pt is being treated by cardiology for medically managed angina.  He  denies any chest pain at this time.  He has had intermittent dyspnea but attributes that to anxiety and panic attacks Pt has had extensive cardiac evaluation as well     03/18/2024   11:19 AM 02/28/2024   11:31 AM 06/11/2023    3:30 PM 04/18/2022   10:08 PM 01/17/2022    4:14 PM  Depression screen PHQ 2/9  Decreased Interest 3 2 1 3 3   Down, Depressed, Hopeless 3 1 2 3 2   PHQ - 2 Score 6 3 3 6 5   Altered sleeping 2 1 0 3 3  Tired, decreased energy 1 1 0 3 1  Change in appetite 0 0 0 2 1  Feeling bad or failure about yourself  3 1 1 3 3   Trouble concentrating 3 2 3 2 2   Moving slowly or fidgety/restless 1 1 0 0 0  Suicidal thoughts 1 1 2 1 1   PHQ-9 Score 17 10 9 20 16   Difficult doing work/chores Very difficult Somewhat difficult Somewhat difficult Somewhat difficult         08/28/2022    1:33 PM 09/18/2022    9:35 AM 06/11/2023    3:30 PM 09/20/2023    1:31 PM 03/18/2024   11:18 AM  Fall Risk  Falls in the past year? 0 0 1 0 1  Was there an injury with Fall? 0  0 0 0 0  Fall Risk Category Calculator 0 0 1 0 2  Fall Risk Category (Retired) Low Low     (RETIRED) Patient Fall Risk Level Low fall risk Low fall risk     Patient at Risk for Falls Due to  No Fall Risks No Fall Risks  History of fall(s);No Fall Risks  Fall risk Follow up Falls evaluation completed Falls evaluation completed Falls evaluation completed  Falls evaluation completed     ROS CONSTITUTIONAL: Negative for chills, fatigue, fever,  E/N/T: Negative for ear pain, nasal congestion and sore throat.  CARDIOVASCULAR: see HPI RESPIRATORY: Negative for recent cough and dyspnea.  GASTROINTESTINAL: Negative for abdominal pain, acid reflux symptoms, constipation, diarrhea, nausea and vomiting.  MSK: Negative for arthralgias and myalgias.  INTEGUMENTARY: Negative for rash.  NEUROLOGICAL: see HPI PSYCHIATRIC: see HPI   Current Outpatient Medications:    acetaminophen  (TYLENOL ) 500 MG tablet, Take 500-1,000 mg by mouth every  6 (six) hours as needed for moderate pain (pain score 4-6)., Disp: , Rfl:    aspirin  EC 81 MG tablet, Take 1 tablet (81 mg total) by mouth daily., Disp: 90 tablet, Rfl: 2   cariprazine  (VRAYLAR ) 3 MG capsule, Take 1 capsule (3 mg total) by mouth daily., Disp: 90 capsule, Rfl: 0   clopidogrel  (PLAVIX ) 75 MG tablet, Take 1 tablet (75 mg total) by mouth daily., Disp: 90 tablet, Rfl: 1   cyclobenzaprine  (FLEXERIL ) 10 MG tablet, Take 1 tablet (10 mg total) by mouth 3 (three) times daily as needed for muscle spasms., Disp: 30 tablet, Rfl: 0   DULoxetine  (CYMBALTA ) 60 MG capsule, Take 1 capsule (60 mg total) by mouth 2 (two) times daily., Disp: 180 capsule, Rfl: 1   ezetimibe  (ZETIA ) 10 MG tablet, Take 1 tablet (10 mg total) by mouth daily., Disp: 90 tablet, Rfl: 1   fexofenadine  (ALLEGRA ) 60 MG tablet, Take 1 tablet (60 mg total) by mouth daily., Disp: 90 tablet, Rfl: 2   hydrochlorothiazide  (HYDRODIURIL ) 12.5 MG tablet, Take 1 tablet (12.5 mg total) by mouth daily., Disp: 30 tablet, Rfl: 11   isosorbide  mononitrate (IMDUR ) 60 MG 24 hr tablet, Take 2 tablets (120 mg total) by mouth daily., Disp: 180 tablet, Rfl: 1   LORazepam  (ATIVAN ) 1 MG tablet, Take 1 tablet (1 mg total) by mouth every 8 (eight) hours as needed for anxiety., Disp: 90 tablet, Rfl: 0   meclizine  (ANTIVERT ) 25 MG tablet, Take 1 tablet (25 mg total) by mouth 3 (three) times daily as needed for dizziness., Disp: 90 tablet, Rfl: 2   metoprolol  succinate (TOPROL -XL) 50 MG 24 hr tablet, Take 1 tablet by mouth daily. Take with or immediately following a meal., Disp: 90 tablet, Rfl: 1   Multiple Vitamin (MULTIVITAMIN WITH MINERALS) TABS tablet, Take 1 tablet by mouth daily. Men's Multivitamin, Disp: , Rfl:    nitroGLYCERIN  (NITROSTAT ) 0.4 MG SL tablet, Dissolve 1 tablet (0.4 mg total) under the tongue every 5 (five) minutes up to 3 doses as needed for chest pain. If no relief after 3 doses call 911., Disp: 25 tablet, Rfl: 1   ondansetron  (ZOFRAN )  4 MG tablet, Take 1 tablet (4 mg total) by mouth every 8 (eight) hours as needed for nausea or vomiting., Disp: 20 tablet, Rfl: 0   pantoprazole  (PROTONIX ) 40 MG tablet, Take 1 tablet (40 mg total) by mouth daily., Disp: 90 tablet, Rfl: 1   ranolazine  (RANEXA ) 1000 MG SR tablet, Take 1 tablet by mouth 2 times daily., Disp: 180  tablet, Rfl: 2   rosuvastatin  (CRESTOR ) 40 MG tablet, Take 1 tablet (40 mg total) by mouth daily. (Patient taking differently: Take 40 mg by mouth 2 (two) times daily.), Disp: 90 tablet, Rfl: 1   Solriamfetol  HCl (SUNOSI ) 150 MG TABS, Take 1 tablet (150 mg total) by mouth every morning., Disp: 90 tablet, Rfl: 1  Past Medical History:  Diagnosis Date   Abnormal EKG 06/13/2023   Abnormal stress test small area of mild ischemia inferior wall 05/24/2022   Aphasia 06/13/2023   Arthropathy of lumbar facet joint 07/03/2023   Bilateral sacroiliitis (HCC) 08/29/2023   Bipolar 1 disorder 02/04/2023   Chronic bilateral low back pain with right-sided sciatica 06/24/2023   Chronic headaches 01/30/2021   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   Coronary artery disease involving coronary bypass graft of native heart with angina pectoris 05/22/2016   Cath 2013 LAD 95% stenosis, D1 90% stenosis, RCA 20%, Circ 30%. Previous stent to D1 2012. EF 60%.   Degeneration of lumbar intervertebral disc 07/03/2023   Dizziness 10/27/2020   Excessive daytime sleepiness 09/11/2016   Gastroesophageal reflux disease 02/04/2023   Generalized anxiety disorder 05/22/2016   Herpes zoster 01/20/2020   History of small bowel obstruction    Admitted 10-14-2007 for partical small bowel obstruction and N.G. tube was placed. Admitted by Dr Maple Seltzer. 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   Other fatigue 06/13/2023   Paronychia of finger, left 08/22/2020   PLMD (periodic limb movement disorder) 09/11/2016   PUD (peptic ulcer disease)    Syncope 06/13/2023   TIA (transient ischemic attack)    Tinnitus of both ears 06/26/2022   Tremor 02/04/2023   Objective:  PHYSICAL EXAM:   BP 100/66   Pulse 75   Temp 98 F (36.7 C) (Temporal)   Resp 18   Ht 5\' 10"  (1.778 m)   Wt 178 lb (80.7 kg)   SpO2 97%   BMI 25.54 kg/m    GEN: Well nourished, well developed, in no acute distress  HEENT: normal external ears and nose - normal external auditory canals and TMS - hearing grossly normal -Cardiac: RRR; no murmurs, rubs, or gallops,no edema - no significant varicosities Respiratory:  normal respiratory rate and pattern with no distress - normal breath sounds with no rales, rhonchi, wheezes or rubs  Skin: warm and dry, no rash  Neuro:  Alert and Oriented x 3,  - CN II-Xii grossly intact Psych: euthymic mood, appropriate affect and demeanor  Assessment & Plan:    Dizziness -     Ambulatory referral to ENT Continue current meds as directed Tinnitus of both ears -     Ambulatory referral to ENT  Atherosclerosis of native coronary artery of native heart without angina pectoris Continue meds as directed Follow up with cardiology as directed GAD (generalized anxiety disorder) Continue meds Restart ativan  as prescribed Bipolar I disorder with depression, severe (HCC) Continue cymbalta  bid and other meds  as directed Follow up with psychiatry as directed Excessive daytime  sleepiness Follow up with neurology and for sleep study as scheduled    Follow-up: Return for for next chronic visit.  An After Visit Summary was printed and given to the patient.  Anthonette Bastos [facility] [phone]

## 2024-03-19 ENCOUNTER — Other Ambulatory Visit: Payer: Self-pay

## 2024-03-19 ENCOUNTER — Other Ambulatory Visit (HOSPITAL_COMMUNITY): Payer: Self-pay

## 2024-03-25 ENCOUNTER — Encounter: Payer: Self-pay | Admitting: Gastroenterology

## 2024-03-26 ENCOUNTER — Encounter: Payer: Self-pay | Admitting: Physician Assistant

## 2024-03-26 ENCOUNTER — Ambulatory Visit: Admitting: Physician Assistant

## 2024-03-26 VITALS — BP 110/62 | HR 71 | Temp 97.1°F | Ht 70.0 in | Wt 182.6 lb

## 2024-03-26 DIAGNOSIS — M7072 Other bursitis of hip, left hip: Secondary | ICD-10-CM | POA: Insufficient documentation

## 2024-03-26 DIAGNOSIS — M7062 Trochanteric bursitis, left hip: Secondary | ICD-10-CM

## 2024-03-26 NOTE — Progress Notes (Signed)
 Acute Office Visit  Subjective:    Patient ID: Jon Gonzales, male    DOB: [birth year], 62 y.o.   MRN: [id number]  Chief Complaint  Patient presents with  . Shot    HPI: Patient is in today for hip pain due to a recent fall.  Patient had a recent fall during the night when he lost his balance. Patient admits to continued pain around his left hip and low back. States that the pain in his hip has only gotten worse and continues to cause him issues. Patient is interested in receiving an injection to see if it will help with the pain.   Past Medical History:  Diagnosis Date  . Abnormal EKG 06/13/2023  . Abnormal stress test small area of mild ischemia inferior wall 05/24/2022  . Aphasia 06/13/2023  . Arthropathy of lumbar facet joint 07/03/2023  . Bilateral sacroiliitis (HCC) 08/29/2023  . Bipolar 1 disorder 02/04/2023  . Chronic bilateral low back pain with right-sided sciatica 06/24/2023  . Chronic headaches 01/30/2021  . Class 2 obesity due to excess calories without serious comorbidity in adult 09/11/2016  . Confusional arousals 09/11/2016  . Coronary artery disease involving coronary bypass graft of native heart with angina pectoris 05/22/2016   Cath 2013 LAD 95% stenosis, D1 90% stenosis, RCA 20%, Circ 30%. Previous stent to D1 2012. EF 60%.  . Degeneration of lumbar intervertebral disc 07/03/2023  . Dizziness 10/27/2020  . Excessive daytime sleepiness 09/11/2016  . Gastroesophageal reflux disease 02/04/2023  . Generalized anxiety disorder 05/22/2016  . Herpes zoster 01/20/2020  . History of small bowel obstruction    Admitted 10-14-2007 for partical small bowel obstruction and N.G. tube was placed. Admitted by Dr Maple Seltzer. Managed conseratively  . Hypercholesterolemia 05/22/2016  . Hyperesthesia 11/29/2021  . Hypertension 05/22/2016  . Hypogonadism in male 05/22/2016  . Hypotension 11/07/2020  . Long-term current use of lithium  02/04/2023  . Low testosterone    . Lumbar  radiculopathy 07/03/2023  . Major depressive disorder 09/11/2016  . Mild cognitive impairment of uncertain or unknown etiology 03/21/2023  . Morton neuroma, left 09/18/2022  . Nephrolithiasis   . Neurological abnormality 06/13/2023  . Obstructive sleep apnea 09/11/2016  . 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 Other fatigue 06/13/2023  . Paronychia of finger, left 08/22/2020  . PLMD (periodic limb movement disorder) 09/11/2016  . PUD (peptic ulcer disease)   . Syncope 06/13/2023  . TIA (transient ischemic attack)   . Tinnitus of both ears 06/26/2022  . Tremor 02/04/2023    Past Surgical History:  Procedure Laterality Date  . CHOLECYSTECTOMY    . COLONOSCOPY  09/10/2012   Mild sigmoid diverticulosis. Small internal hemorrhoids. Otherwise normal colonoscopy to terminal ileum  . CORONARY ARTERY BYPASS GRAFT  02/19/2012   L - LAD, SVG - D1, SVG - OM  . CORONARY STENT INTERVENTION N/A 11/05/2019   Procedure: CORONARY STENT INTERVENTION;  Surgeon: Avanell Leigh, MD;  Location: [facility];  Service: Cardiovascular;  Laterality: N/A;  SVG to DIAG  . ESOPHAGOGASTRODUODENOSCOPY  01/08/2007   Multiple duodenal ulcers with stenosis/stricture of the distal first portion of the duodenum (measuring approximantely 9mm) Mild gastritis.  Aaron Aas LEFT HEART CATH AND CORS/GRAFTS ANGIOGRAPHY N/A 11/05/2019   Procedure: LEFT HEART CATH AND CORS/GRAFTS ANGIOGRAPHY;  Surgeon: Avanell Leigh, MD;  Location: [facility];  Service: Cardiovascular;  Laterality: N/A;  . LEFT HEART CATH AND CORS/GRAFTS ANGIOGRAPHY N/A 08/29/2023   Procedure: LEFT HEART CATH AND  CORS/GRAFTS ANGIOGRAPHY;  Surgeon: Odie Benne, MD;  Location: [facility];  Service: Cardiovascular;  Laterality: N/A;  . LUMBAR LAMINECTOMY/DECOMPRESSION MICRODISCECTOMY Left 02/14/2024   Procedure: LUMBAR LAMINECTOMY/DECOMPRESSION MICRODISCECTOMY LEFT LUMBAR TWO-LUMBAR THREE;  Surgeon: Augusto Blonder, MD;  Location: [facility];  Service: Neurosurgery;  Laterality: Left;  MICRODISCECTOMY, LT, L23  . RENAL ANGIOGRAPHY N/A 11/05/2019   Procedure: RENAL ANGIOGRAPHY;  Surgeon: Avanell Leigh, MD;  Location: [facility];  Service: Cardiovascular;  Laterality: N/A;  . TONSILLECTOMY AND ADENOIDECTOMY    . WRIST SURGERY Right     Family History  Problem Relation Age of Onset  . Aneurysm Mother 53       cerebral  . CAD Father 64  . Heart disease Sister        Unclear  . Healthy Brother   . Dementia Other        several individuals on extended paternal side of family    Social History   Socioeconomic History  . Marital status: Married    Spouse name: Not on file  . Number of children: Not on file  . Years of education: 61  . Highest education level: Bachelor's degree (e.g., BA, AB, BS)  Occupational History  . Occupation: Oncologist: [employer]  Tobacco Use  . Smoking status: Never  . Smokeless tobacco: Never  Vaping Use  . Vaping status: Never Used  Substance and Sexual Activity  . Alcohol use: Yes    Alcohol/week: 14.0 standard drinks of alcohol    Types: 14 Cans of beer per week    Comment: ~ 2 beers nightly  . Drug use: Never  . Sexual activity: Not on file  Other Topics Concern  . 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        Right Handed   Lives in two story home    Lives with wife   2 step children'   Social Drivers of Health   Financial Resource Strain: Low Risk  (06/04/2022)   Overall Financial Resource Strain (CARDIA)   . Difficulty of Paying Living Expenses: Not hard at all  Food Insecurity: No Food Insecurity (06/04/2022)   Hunger Vital Sign   . Worried About Programme researcher, broadcasting/film/video in the Last Year: Never true   . Ran Out of Food in the Last Year: Never true  Transportation Needs: No Transportation Needs (06/04/2022)   PRAPARE -  Transportation   . Lack of Transportation (Medical): No   . Lack of Transportation (Non-Medical): No  Physical Activity: Inactive (06/04/2022)   Exercise Vital Sign   . Days of Exercise per Week: 0 days   . Minutes of Exercise per Session: 0 min  Stress: No Stress Concern Present (06/04/2022)   [employer] of Occupational Health - Occupational Stress Questionnaire   . Feeling of Stress : Not at all  Social Connections: Moderately Integrated (06/04/2022)   Social Connection and Isolation Panel   . Frequency of Communication with Friends and Family: More than three times a week   . Frequency of Social Gatherings with Friends and Family: More than three times a week   . Attends Religious Services: Never   . Active Member of Clubs or Organizations: Yes   . Attends Banker Meetings: More than 4 times per year   . Marital Status: Married  Intimate Partner Violence: Not At Risk (06/04/2022)   Humiliation, Afraid, Rape, and Kick questionnaire   . Fear of Current or Ex-Partner: No   . Emotionally Abused: No   . Physically Abused: No   . Sexually Abused: No    Outpatient Medications Prior to Visit  Medication Sig Dispense Refill  . acetaminophen  (TYLENOL ) 500 MG tablet Take 500-1,000 mg by mouth every 6 (six) hours as needed for moderate pain (pain score 4-6).    . aspirin  EC 81 MG tablet Take 1 tablet (81 mg total) by mouth daily. 90 tablet 2  . cariprazine  (VRAYLAR ) 3 MG capsule Take 1 capsule (3 mg total) by mouth daily. 90 capsule 0  . clopidogrel  (PLAVIX ) 75 MG tablet Take 1 tablet (75 mg total) by mouth daily. 90 tablet 1  . cyclobenzaprine  (FLEXERIL ) 10 MG tablet Take 1 tablet (10 mg total) by mouth 3 (three) times daily as needed for muscle spasms. 30 tablet 0  . DULoxetine  (CYMBALTA ) 60 MG capsule Take 1 capsule (60 mg total) by mouth 2 (two) times daily. 180 capsule 1  . ezetimibe  (ZETIA ) 10 MG tablet Take 1 tablet (10 mg total) by mouth daily. 90 tablet 1  .  fexofenadine  (ALLEGRA ) 60 MG tablet Take 1 tablet (60 mg total) by mouth daily. 90 tablet 2  . hydrochlorothiazide  (HYDRODIURIL ) 12.5 MG tablet Take 1 tablet (12.5 mg total) by mouth daily. 30 tablet 11  . isosorbide  mononitrate (IMDUR ) 60 MG 24 hr tablet Take 2 tablets (120 mg total) by mouth daily. 180 tablet 1  . LORazepam  (ATIVAN ) 1 MG tablet Take 1 tablet (1 mg total) by mouth every 8 (eight) hours as needed for anxiety. 90 tablet 0  . meclizine  (ANTIVERT ) 25 MG tablet Take 1 tablet (25 mg total) by mouth 3 (three) times daily as needed for dizziness. 90 tablet 2  . metoprolol  succinate (TOPROL -XL) 50 MG 24 hr tablet Take 1 tablet by mouth daily. Take with or immediately following a meal. 90 tablet 1  . Multiple Vitamin (MULTIVITAMIN WITH MINERALS) TABS tablet Take 1 tablet by mouth daily. Men's Multivitamin    . nitroGLYCERIN  (NITROSTAT ) 0.4 MG SL tablet Dissolve 1 tablet (0.4 mg total) under the tongue every 5 (five) minutes up to 3 doses as needed for chest pain. If no relief after 3 doses call 911. 25 tablet 1  . ondansetron  (ZOFRAN ) 4 MG tablet Take 1 tablet (4 mg total) by mouth every 8 (eight) hours as needed for nausea or vomiting. 20 tablet 0  . pantoprazole  (PROTONIX ) 40 MG tablet Take 1 tablet (40 mg total) by mouth daily. 90 tablet 1  . ranolazine  (RANEXA ) 1000 MG SR tablet Take 1 tablet by mouth 2 times daily. 180 tablet 2  . rosuvastatin  (CRESTOR ) 40 MG tablet Take 1 tablet (40 mg total) by mouth daily. (Patient taking differently: Take 40 mg by mouth 2 (two) times daily.) 90 tablet 1  . Solriamfetol  HCl (SUNOSI ) 150 MG TABS Take 1 tablet (150 mg total) by mouth every morning. 90 tablet 1   No facility-administered medications prior to visit.    Allergies  Allergen Reactions  . Trintellix [Vortioxetine] Other (See Comments)    Headaches.  . Penicillins Rash    Review of Systems  Constitutional:  Negative for appetite change, fatigue and fever.  HENT:  Negative for  congestion, ear pain, sinus pressure and sore throat.   Respiratory:  Negative for cough, chest tightness, shortness of breath and wheezing.   Cardiovascular:  Negative for chest pain and palpitations.  Gastrointestinal:  Negative for abdominal pain, constipation, diarrhea, nausea and vomiting.  Genitourinary:  Negative for dysuria and hematuria.  Musculoskeletal:  Negative for arthralgias, back pain, joint swelling and myalgias.  Skin:  Negative for rash.  Neurological:  Negative for dizziness, weakness and headaches.  Psychiatric/Behavioral:  Negative for dysphoric mood. The patient is not nervous/anxious.        Objective:        03/26/2024    1:19 PM 03/18/2024   11:13 AM 02/28/2024   10:12 AM  Vitals with BMI  Height 5' 10 5' 10 5' 10  Weight 182 lbs 10 oz 178 lbs 177 lbs  BMI 26.2 25.54 25.4  Systolic 110 100 409  Diastolic 62 66 68  Pulse 71 75 73    Orthostatic VS for the past 72 hrs (Last 3 readings):  Patient Position BP Location  03/26/24 1319 Sitting Left Arm     Physical Exam  Musculoskeletal:     Right hip: Normal.     Left hip: Tenderness present. No deformity, lacerations, bony tenderness or crepitus.     Comments: Pain directly inferior to greater trochanter. On left leg    Health Maintenance Due  Topic Date Due  . Colonoscopy  09/10/2022  . COVID-19 Vaccine (6 - Pfizer risk 2024-25 season) 01/28/2024    There are no preventive care reminders to display for this patient.   Lab Results  Component Value Date   TSH 0.665 03/03/2024   Lab Results  Component Value Date   WBC 4.2 03/03/2024   HGB 13.2 03/03/2024   HCT 38.7 03/03/2024   MCV 101 (H) 03/03/2024   PLT 124 (L) 03/03/2024   Lab Results  Component Value Date   NA 142 03/03/2024   K 4.0 03/03/2024   CO2 21 03/03/2024   GLUCOSE 96 03/03/2024   BUN 14 03/03/2024   CREATININE 1.02 03/03/2024   BILITOT 0.6 03/03/2024   ALKPHOS 51 03/03/2024   AST 18 03/03/2024   ALT 17  03/03/2024   PROT 6.6 03/03/2024   ALBUMIN 4.2 03/03/2024   CALCIUM  8.8 03/03/2024   ANIONGAP 11 02/03/2024   EGFR 83 03/03/2024   Lab Results  Component Value Date   CHOL 127 03/03/2024   Lab Results  Component Value Date   HDL 55 03/03/2024   Lab Results  Component Value Date   LDLCALC 54 03/03/2024   Lab Results  Component Value Date   TRIG 97 03/03/2024   Lab Results  Component Value Date   CHOLHDL 2.3 03/03/2024   Lab Results  Component Value Date   HGBA1C 5.2 03/03/2024   Joint Injection/Arthrocentesis  Date/Time: 03/26/2024 3:24 PM  Performed by: Odilia Bennett, PA Authorized by: Odilia Bennett, PA  Indications: pain  Body area: hip Joint: left hip Local anesthesia used: yes  Anesthesia: Local anesthesia used: yes Local Anesthetic: co-phenylcaine spray  Sedation: Patient sedated: no  Needle gauge: 23G. Ultrasound guidance: no Approach: lateral Aspirate: clear Aspirate amount: 1 mL Methylprednisolone  amount: 80 mg Lidocaine  1% amount: 5 mL Patient tolerance: patient tolerated the procedure well with no immediate complications       Assessment & Plan:  Trochanteric bursitis of left hip Assessment & Plan: Chronic pain since fall Will inject today Continue to monitor symptoms Will adjust treatment based on symptoms.      Other orders -     Arthrocentesis     No orders of the defined types were placed  in this encounter.   Orders Placed This Encounter  Procedures  . Joint Injection/Arthrocentesis     Follow-up: Return if symptoms worsen or fail to improve.  An After Visit Summary was printed and given to the patient.    I,Lauren M Auman,acting as a Neurosurgeon for [employer], PA.,have documented all relevant documentation on the behalf of Odilia Bennett, PA,as directed by  Odilia Bennett, PA while in the presence of Odilia Bennett, [place].    Odilia Bennett, [place] [facility] [phone]

## 2024-03-26 NOTE — Assessment & Plan Note (Addendum)
 Chronic pain since fall Will inject today Continue to monitor symptoms Will adjust treatment based on symptoms.

## 2024-03-30 ENCOUNTER — Telehealth: Payer: Self-pay | Admitting: Physician Assistant

## 2024-03-30 NOTE — Telephone Encounter (Signed)
 [employer] FML[employer] Forms

## 2024-04-01 ENCOUNTER — Encounter (INDEPENDENT_AMBULATORY_CARE_PROVIDER_SITE_OTHER): Payer: Self-pay

## 2024-04-01 ENCOUNTER — Other Ambulatory Visit (HOSPITAL_BASED_OUTPATIENT_CLINIC_OR_DEPARTMENT_OTHER): Payer: Self-pay

## 2024-04-01 MED ORDER — PREDNISONE 10 MG (21) PO TBPK
ORAL_TABLET | ORAL | 0 refills | Status: DC
  Filled 2024-04-01: qty 21, 6d supply, fill #0

## 2024-04-01 MED ORDER — GABAPENTIN 300 MG PO CAPS
300.0000 mg | ORAL_CAPSULE | Freq: Every evening | ORAL | 3 refills | Status: DC
  Filled 2024-04-01: qty 30, 30d supply, fill #0
  Filled 2024-05-05: qty 30, 30d supply, fill #1
  Filled 2024-05-06: qty 30, 30d supply, fill #0
  Filled 2024-06-02: qty 30, 30d supply, fill #1
  Filled 2024-07-07: qty 30, 30d supply, fill #2

## 2024-04-02 ENCOUNTER — Telehealth: Payer: Self-pay

## 2024-04-02 ENCOUNTER — Other Ambulatory Visit: Payer: Self-pay | Admitting: Physician Assistant

## 2024-04-02 DIAGNOSIS — I2510 Atherosclerotic heart disease of native coronary artery without angina pectoris: Secondary | ICD-10-CM

## 2024-04-02 DIAGNOSIS — I25118 Atherosclerotic heart disease of native coronary artery with other forms of angina pectoris: Secondary | ICD-10-CM

## 2024-04-02 MED ORDER — WEGOVY 2.4 MG/0.75ML ~~LOC~~ SOAJ: 2.4000 mg | SUBCUTANEOUS | 2 refills | Status: DC

## 2024-04-02 NOTE — Telephone Encounter (Signed)
 Received FMLA paperwork from [employer]. Sent mychart message to patient regarding form fee.

## 2024-04-09 ENCOUNTER — Other Ambulatory Visit: Payer: Self-pay

## 2024-04-10 ENCOUNTER — Other Ambulatory Visit: Payer: Self-pay | Admitting: Family Medicine

## 2024-04-10 ENCOUNTER — Other Ambulatory Visit: Payer: Self-pay

## 2024-04-10 MED ORDER — WEGOVY 2.4 MG/0.75ML ~~LOC~~ SOAJ: 2.4000 mg | SUBCUTANEOUS | 1 refills | Status: DC

## 2024-04-13 ENCOUNTER — Other Ambulatory Visit: Payer: Self-pay

## 2024-04-13 ENCOUNTER — Other Ambulatory Visit (HOSPITAL_BASED_OUTPATIENT_CLINIC_OR_DEPARTMENT_OTHER): Payer: Self-pay

## 2024-04-15 NOTE — Telephone Encounter (Signed)
 Mychart message has not been read, called and LVM for patient requesting a call back

## 2024-04-16 ENCOUNTER — Institutional Professional Consult (permissible substitution): Payer: 59 | Admitting: Psychology

## 2024-04-16 ENCOUNTER — Ambulatory Visit: Payer: Self-pay

## 2024-04-19 ENCOUNTER — Encounter

## 2024-04-20 ENCOUNTER — Encounter

## 2024-04-22 ENCOUNTER — Other Ambulatory Visit (HOSPITAL_COMMUNITY): Payer: Self-pay | Admitting: Neurosurgery

## 2024-04-22 DIAGNOSIS — M5416 Radiculopathy, lumbar region: Secondary | ICD-10-CM

## 2024-04-23 ENCOUNTER — Encounter: Payer: 59 | Admitting: Psychology

## 2024-04-26 ENCOUNTER — Other Ambulatory Visit: Payer: Self-pay | Admitting: Behavioral Health

## 2024-04-26 DIAGNOSIS — F411 Generalized anxiety disorder: Secondary | ICD-10-CM

## 2024-04-28 ENCOUNTER — Other Ambulatory Visit: Payer: Self-pay

## 2024-04-28 ENCOUNTER — Other Ambulatory Visit (HOSPITAL_COMMUNITY): Payer: Self-pay

## 2024-04-28 MED ORDER — LORAZEPAM 1 MG PO TABS
1.0000 mg | ORAL_TABLET | Freq: Three times a day (TID) | ORAL | 0 refills | Status: DC | PRN
  Filled 2024-04-28: qty 90, 30d supply, fill #0

## 2024-04-30 ENCOUNTER — Other Ambulatory Visit: Payer: Self-pay

## 2024-04-30 ENCOUNTER — Other Ambulatory Visit (HOSPITAL_COMMUNITY): Payer: Self-pay

## 2024-04-30 ENCOUNTER — Other Ambulatory Visit: Payer: Self-pay | Admitting: Physician Assistant

## 2024-04-30 ENCOUNTER — Other Ambulatory Visit: Payer: Self-pay | Admitting: Cardiology

## 2024-04-30 DIAGNOSIS — I25118 Atherosclerotic heart disease of native coronary artery with other forms of angina pectoris: Secondary | ICD-10-CM

## 2024-04-30 MED ORDER — ISOSORBIDE MONONITRATE ER 60 MG PO TB24
120.0000 mg | ORAL_TABLET | Freq: Every day | ORAL | 1 refills | Status: DC
  Filled 2024-04-30: qty 180, 90d supply, fill #0
  Filled 2024-05-05 – 2024-05-06 (×2): qty 60, 30d supply, fill #0
  Filled 2024-06-02: qty 60, 30d supply, fill #1
  Filled 2024-07-07: qty 60, 30d supply, fill #2
  Filled 2024-08-03 – 2024-08-10 (×4): qty 60, 30d supply, fill #3
  Filled 2024-09-01 – 2024-09-07 (×2): qty 60, 30d supply, fill #4

## 2024-04-30 MED ORDER — EZETIMIBE 10 MG PO TABS
10.0000 mg | ORAL_TABLET | Freq: Every day | ORAL | 1 refills | Status: DC
  Filled 2024-04-30: qty 90, 90d supply, fill #0
  Filled 2024-05-05 – 2024-05-06 (×2): qty 30, 30d supply, fill #0
  Filled 2024-06-02: qty 30, 30d supply, fill #1
  Filled 2024-07-07: qty 30, 30d supply, fill #2
  Filled 2024-08-03 – 2024-08-10 (×2): qty 30, 30d supply, fill #3
  Filled 2024-09-01 – 2024-09-07 (×2): qty 30, 30d supply, fill #4
  Filled 2024-10-01 – 2024-10-05 (×2): qty 30, 30d supply, fill #5

## 2024-04-30 MED ORDER — ASPIRIN 81 MG PO TBEC
81.0000 mg | DELAYED_RELEASE_TABLET | Freq: Every day | ORAL | 0 refills | Status: DC
  Filled 2024-04-30: qty 90, 90d supply, fill #0
  Filled 2024-05-05 – 2024-05-06 (×3): qty 30, 30d supply, fill #0
  Filled 2024-06-02: qty 30, 30d supply, fill #1
  Filled 2024-07-07: qty 30, 30d supply, fill #2

## 2024-05-01 ENCOUNTER — Ambulatory Visit (HOSPITAL_COMMUNITY)
Admission: RE | Admit: 2024-05-01 | Discharge: 2024-05-01 | Disposition: A | Discharge status: Home / Self Care | Source: Ambulatory Visit | Attending: Neurosurgery | Admitting: Neurosurgery

## 2024-05-01 ENCOUNTER — Other Ambulatory Visit: Payer: Self-pay

## 2024-05-01 ENCOUNTER — Other Ambulatory Visit (HOSPITAL_COMMUNITY): Payer: Self-pay

## 2024-05-01 DIAGNOSIS — M5126 Other intervertebral disc displacement, lumbar region: Secondary | ICD-10-CM | POA: Diagnosis not present

## 2024-05-01 DIAGNOSIS — M5416 Radiculopathy, lumbar region: Secondary | ICD-10-CM | POA: Diagnosis not present

## 2024-05-01 DIAGNOSIS — R609 Edema, unspecified: Secondary | ICD-10-CM | POA: Diagnosis not present

## 2024-05-01 DIAGNOSIS — M48061 Spinal stenosis, lumbar region without neurogenic claudication: Secondary | ICD-10-CM | POA: Diagnosis not present

## 2024-05-01 DIAGNOSIS — M47816 Spondylosis without myelopathy or radiculopathy, lumbar region: Secondary | ICD-10-CM | POA: Diagnosis not present

## 2024-05-04 ENCOUNTER — Other Ambulatory Visit: Payer: Self-pay

## 2024-05-04 ENCOUNTER — Other Ambulatory Visit: Payer: Self-pay | Admitting: Family Medicine

## 2024-05-04 DIAGNOSIS — I25118 Atherosclerotic heart disease of native coronary artery with other forms of angina pectoris: Secondary | ICD-10-CM

## 2024-05-04 MED ORDER — WEGOVY 2.4 MG/0.75ML ~~LOC~~ SOAJ: 2.4000 mg | SUBCUTANEOUS | 1 refills | Status: DC

## 2024-05-04 MED ORDER — WEGOVY 2.4 MG/0.75ML ~~LOC~~ SOAJ
2.4000 mg | SUBCUTANEOUS | 2 refills | Status: DC
  Filled 2024-05-04: qty 3, 28d supply, fill #0

## 2024-05-05 ENCOUNTER — Other Ambulatory Visit (HOSPITAL_COMMUNITY): Payer: Self-pay

## 2024-05-05 ENCOUNTER — Other Ambulatory Visit: Payer: Self-pay

## 2024-05-06 ENCOUNTER — Other Ambulatory Visit: Payer: Self-pay

## 2024-05-07 ENCOUNTER — Ambulatory Visit: Admitting: Psychiatry

## 2024-05-07 ENCOUNTER — Other Ambulatory Visit: Payer: Self-pay

## 2024-05-11 ENCOUNTER — Other Ambulatory Visit (HOSPITAL_BASED_OUTPATIENT_CLINIC_OR_DEPARTMENT_OTHER): Payer: Self-pay

## 2024-05-11 DIAGNOSIS — Z6826 Body mass index (BMI) 26.0-26.9, adult: Secondary | ICD-10-CM | POA: Diagnosis not present

## 2024-05-11 DIAGNOSIS — M5416 Radiculopathy, lumbar region: Secondary | ICD-10-CM | POA: Diagnosis not present

## 2024-05-14 ENCOUNTER — Ambulatory Visit: Admitting: Gastroenterology

## 2024-05-14 ENCOUNTER — Other Ambulatory Visit: Payer: Self-pay | Admitting: Physician Assistant

## 2024-05-14 ENCOUNTER — Other Ambulatory Visit (HOSPITAL_BASED_OUTPATIENT_CLINIC_OR_DEPARTMENT_OTHER): Payer: Self-pay

## 2024-05-14 DIAGNOSIS — M5416 Radiculopathy, lumbar region: Secondary | ICD-10-CM | POA: Diagnosis not present

## 2024-05-14 DIAGNOSIS — G8929 Other chronic pain: Secondary | ICD-10-CM

## 2024-05-14 MED ORDER — TRAMADOL HCL 50 MG PO TABS
50.0000 mg | ORAL_TABLET | Freq: Three times a day (TID) | ORAL | 0 refills | Status: AC | PRN
  Filled 2024-05-14: qty 15, 5d supply, fill #0

## 2024-05-14 NOTE — Progress Notes (Deleted)
 SABRA

## 2024-05-21 ENCOUNTER — Telehealth: Payer: Self-pay

## 2024-05-21 NOTE — Telephone Encounter (Signed)
   Pre-operative Risk Assessment    Patient Name: Jon Gonzales  DOB: 13-Apr-1961 MRN: [id number]   Date of last office visit: 08/28/2024, Dr. Liborio Date of next office visit: NONE   Request for Surgical Clearance    Procedure:  Lumbar fusion  Date of Surgery:  Clearance TBD                                Surgeon: Dr. Gerldine Maizes Surgeon's Group or Practice Name: [facility] Phone number: [phone] Fax number: [phone]   Type of Clearance Requested:   - Medical  - Pharmacy:  Hold Aspirin  and Clopidogrel  (Plavix )     Type of Anesthesia:  General    Additional requests/questions:    Bonney Asberry KANDICE Ethelene   05/21/2024, 9:39 AM

## 2024-05-21 NOTE — Telephone Encounter (Signed)
   Name: Jon Gonzales  DOB: 01/21/1961  MRN: [id number]  Primary Cardiologist: Alean SAUNDERS Madireddy, MD  Chart reviewed as part of pre-operative protocol coverage. Because of Jon Gonzales's past medical history and time since last visit, he will require a follow-up in-office visit in order to better assess preoperative cardiovascular risk.  Pre-op covering staff: - Please schedule appointment and call patient to inform them. If patient already had an upcoming appointment within acceptable timeframe, please add pre-op clearance to the appointment notes so provider is aware. - Please contact requesting surgeon's office via preferred method (i.e, phone, fax) to inform them of need for appointment prior to surgery.  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  However I would recommend continuing aspirin  81 mg uninterrupted given the risk for perioperative MI.  If continuing aspirin  is considered prohibitive from the procedural standpoint, risk of withholding aspirin  should be discussed closely with the surgeon performing the procedure and discussed the risk benefits and proceed accordingly.   -Dr. Liborio (from 01/21/24 clearance request)  Jon LOISE Fabry, PA-C  05/21/2024, 11:34 AM

## 2024-05-21 NOTE — Telephone Encounter (Signed)
 Pt. Scheduled for pre-op clearance on 06/10/24 with Delon Hoover, NP.

## 2024-05-26 ENCOUNTER — Other Ambulatory Visit: Payer: Self-pay | Admitting: Neurosurgery

## 2024-05-29 ENCOUNTER — Other Ambulatory Visit (HOSPITAL_COMMUNITY): Payer: Self-pay

## 2024-05-29 ENCOUNTER — Other Ambulatory Visit: Payer: Self-pay | Admitting: Psychiatry

## 2024-05-29 ENCOUNTER — Other Ambulatory Visit: Payer: Self-pay

## 2024-05-29 ENCOUNTER — Ambulatory Visit: Attending: Cardiology | Admitting: Cardiology

## 2024-05-29 ENCOUNTER — Encounter: Payer: Self-pay | Admitting: Cardiology

## 2024-05-29 VITALS — BP 100/70 | HR 81 | Ht 70.0 in | Wt 181.0 lb

## 2024-05-29 DIAGNOSIS — F314 Bipolar disorder, current episode depressed, severe, without psychotic features: Secondary | ICD-10-CM

## 2024-05-29 DIAGNOSIS — I25709 Atherosclerosis of coronary artery bypass graft(s), unspecified, with unspecified angina pectoris: Secondary | ICD-10-CM | POA: Diagnosis not present

## 2024-05-29 DIAGNOSIS — I10 Essential (primary) hypertension: Secondary | ICD-10-CM | POA: Diagnosis not present

## 2024-05-29 DIAGNOSIS — M5416 Radiculopathy, lumbar region: Secondary | ICD-10-CM

## 2024-05-29 DIAGNOSIS — Z01818 Encounter for other preprocedural examination: Secondary | ICD-10-CM

## 2024-05-29 DIAGNOSIS — G4733 Obstructive sleep apnea (adult) (pediatric): Secondary | ICD-10-CM | POA: Diagnosis not present

## 2024-05-29 MED ORDER — CARIPRAZINE HCL 3 MG PO CAPS
3.0000 mg | ORAL_CAPSULE | Freq: Every day | ORAL | 0 refills | Status: DC
  Filled 2024-05-29: qty 30, 30d supply, fill #0
  Filled 2024-07-07: qty 30, 30d supply, fill #1
  Filled 2024-08-03 – 2024-08-10 (×3): qty 30, 30d supply, fill #2

## 2024-05-29 NOTE — Progress Notes (Unsigned)
 Cardiology Office Note:    Date:  05/29/2024   ID:  Jon Gonzales, DOB 09/16/1961, MRN [id number]  PCP:  Jon Credit, PA-C  Cardiologist:  Jon Fitch, MD    Referring MD: Jon Credit, PA-C   Chief Complaint  Patient presents with   Pre-op Exam    History of Present Illness:    Jon Gonzales is a 64 y.o. male    with past medical history significant for premature coronary artery disease.  In 2012 he got coronary bypass graft with 3 vessels LIMA to LAD SVG diagonal SVG to obtuse marginal.  In January 2021 he required drug-eluting stent to native diagonal branch.  He also had history of depression, anxiety, essential hypertension, dyslipidemia.  Also recently he started having some difficulty hearing to some suspicion for Mnire's disease. He presented to my office in May with chief complaint of chest pain.  Since that time he did have a stress test done stress to show ischemia only mild and small area involving inferior wall, prior cardiac catheterization from 2021 show RCA lesion however was only 50%, we gradually try to put him on medication he said since he seen him last time he have to use nitroglycerin  only once In about 3 weeks ago he said the episode of chest pain happens every few weeks.  We had a long discussion about what to do with the situation.  I think we have a level that appropriate guideline directed medical therapy need to be maximized. Last cardiac catheterization done in October 2024, he did not required any intervention at that time.  Comes today to my office because he required repeated surgery on his back.  He did have 1 surgery in May now it looks like another surgery is needed.  Cardiac wise doing well.  Denies of any chest pain tightness squeezing pressure burning chest no palpitation.  He works slow but look like he is able to do 4 METS.  Past Medical History:  Diagnosis Date   Abnormal EKG 06/13/2023   Abnormal stress test small area of mild ischemia inferior  wall 05/24/2022   Aphasia 06/13/2023   Arthropathy of lumbar facet joint 07/03/2023   Bilateral sacroiliitis (HCC) 08/29/2023   Bipolar 1 disorder 02/04/2023   Chronic bilateral low back pain with right-sided sciatica 06/24/2023   Chronic headaches 01/30/2021   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   Coronary artery disease involving coronary bypass graft of native heart with angina pectoris 05/22/2016   Cath 2013 LAD 95% stenosis, D1 90% stenosis, RCA 20%, Circ 30%. Previous stent to D1 2012. EF 60%.   Degeneration of lumbar intervertebral disc 07/03/2023   Dizziness 10/27/2020   Excessive daytime sleepiness 09/11/2016   Gastroesophageal reflux disease 02/04/2023   Generalized anxiety disorder 05/22/2016   Herpes zoster 01/20/2020   History of small bowel obstruction    Admitted 10-14-2007 for partical small bowel obstruction and N.G. tube was placed. Admitted by Dr Gerard. 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 Small internal hemorrhoids. Otherwise normal colonoscopy to terminal ileum   CORONARY ARTERY BYPASS GRAFT  02/19/2012   L - LAD, SVG - D1, SVG - OM   CORONARY STENT INTERVENTION N/A 11/05/2019   Procedure: CORONARY STENT INTERVENTION;  Surgeon: Court Dorn PARAS, MD;  Location: [facility];  Service: Cardiovascular;  Laterality: N/A;  SVG to DIAG   ESOPHAGOGASTRODUODENOSCOPY  01/08/2007   Multiple duodenal ulcers with stenosis/stricture of the distal first portion of the duodenum (measuring approximantely 9mm) Mild gastritis.   LEFT HEART CATH AND CORS/GRAFTS ANGIOGRAPHY N/A 11/05/2019   Procedure: LEFT HEART CATH AND CORS/GRAFTS ANGIOGRAPHY;  Surgeon: Court Dorn PARAS, MD;  Location: [facility];  Service: Cardiovascular;  Laterality: N/A;   LEFT HEART CATH AND CORS/GRAFTS ANGIOGRAPHY N/A 08/29/2023   Procedure: LEFT HEART CATH AND CORS/GRAFTS ANGIOGRAPHY;  Surgeon: Verlin Lonni BIRCH, MD;  Location: [facility];  Service: Cardiovascular;  Laterality: N/A;   LUMBAR LAMINECTOMY/DECOMPRESSION MICRODISCECTOMY Left 02/14/2024   Procedure: LUMBAR LAMINECTOMY/DECOMPRESSION MICRODISCECTOMY LEFT LUMBAR TWO-LUMBAR THREE;  Surgeon: Lanis Pupa, MD;  Location: [facility] OR;  Service: Neurosurgery;  Laterality: Left;  MICRODISCECTOMY, LT, L23   RENAL ANGIOGRAPHY N/A 11/05/2019   Procedure: RENAL ANGIOGRAPHY;  Surgeon: Court Dorn PARAS, MD;  Location: [facility];  Service: Cardiovascular;  Laterality: N/A;   TONSILLECTOMY AND ADENOIDECTOMY     WRIST SURGERY Right     Current Medications: Current Meds  Medication Sig   acetaminophen  (TYLENOL ) 500 MG tablet Take 500-1,000 mg by mouth every 6 (six) hours as needed for moderate pain (pain score 4-6).   aspirin  EC (ASPIRIN  LOW DOSE) 81 MG tablet Take 1 tablet (81 mg total) by mouth daily.   cariprazine  (VRAYLAR ) 3 MG capsule Take 1 capsule (3 mg total) by mouth daily.   clopidogrel  (PLAVIX ) 75 MG  tablet Take 1 tablet (75 mg total) by mouth daily.   cyclobenzaprine  (FLEXERIL ) 10 MG tablet Take 1 tablet (10 mg total) by mouth 3 (three) times daily as needed for muscle spasms.   DULoxetine  (CYMBALTA ) 60 MG capsule Take 1 capsule (60 mg total) by mouth 2 (two) times daily.   ezetimibe  (ZETIA ) 10 MG tablet Take 1 tablet (10 mg total) by mouth daily.   fexofenadine  (ALLEGRA ) 60 MG tablet Take 1 tablet (60 mg total) by mouth daily.   gabapentin  (NEURONTIN ) 300 MG capsule Take 1 capsule (300 mg total) by mouth every evening.   hydrochlorothiazide  (HYDRODIURIL ) 12.5 MG tablet Take 1 tablet (12.5 mg total) by mouth daily.   isosorbide  mononitrate (IMDUR ) 60 MG 24 hr tablet Take 2 tablets (120 mg total) by mouth daily.   LORazepam  (ATIVAN ) 1 MG tablet Take 1 tablet (1 mg total) by mouth every 8 (eight) hours as needed for anxiety.   meclizine  (ANTIVERT ) 25 MG tablet Take 1 tablet (25 mg total) by mouth 3 (three) times daily as needed for dizziness.   metoprolol  succinate (TOPROL -XL) 50 MG 24 hr tablet Take 1 tablet by mouth daily. Take with or immediately  following a meal.   Multiple Vitamin (MULTIVITAMIN WITH MINERALS) TABS tablet Take 1 tablet by mouth daily. Men's Multivitamin   nitroGLYCERIN  (NITROSTAT ) 0.4 MG SL tablet Dissolve 1 tablet (0.4 mg total) under the tongue every 5 (five) minutes up to 3 doses as needed for chest pain. If no relief after 3 doses call 911.   ondansetron  (ZOFRAN ) 4 MG tablet Take 1 tablet (4 mg total) by mouth every 8 (eight) hours as needed for nausea or vomiting.   pantoprazole  (PROTONIX ) 40 MG tablet Take 1 tablet (40 mg total) by mouth daily.   ranolazine  (RANEXA ) 1000 MG SR tablet Take 1 tablet by mouth 2 times daily.   rosuvastatin  (CRESTOR ) 40 MG tablet Take 1 tablet (40 mg total) by mouth daily.   Semaglutide -Weight Management (WEGOVY ) 2.4 MG/0.75ML SOAJ Inject 2.4 mg into the skin once a week.   Solriamfetol  HCl (SUNOSI ) 150 MG TABS Take 1 tablet (150 mg total)  by mouth every morning.     Allergies:   Trintellix [vortioxetine] and Penicillins   Social History   Socioeconomic History   Marital status: Married    Spouse name: Not on file   Number of children: Not on file   Years of education: 16   Highest education level: Bachelor's degree (e.g., BA, AB, BS)  Occupational History   Occupation: Oncologist: [employer]  Tobacco Use   Smoking status: Never   Smokeless tobacco: Never  Vaping Use   Vaping status: Never Used  Substance and Sexual Activity   Alcohol use: Yes    Alcohol/week: 14.0 standard drinks of alcohol    Types: 14 Cans of beer per week    Comment: ~ 2 beers nightly   Drug use: Never   Sexual activity: Not on file  Other Topics Concern   Not on file  Social History Narrative   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        Right Handed   Lives in two story home    Lives with wife   2 step children'   Social Drivers of Health   Financial Resource Strain: Low Risk  (06/04/2022)   Overall Financial Resource Strain (CARDIA)    Difficulty of Paying Living Expenses: Not hard at all  Food Insecurity: No Food Insecurity (06/04/2022)   Hunger Vital Sign    Worried About Running Out of Food in the Last Year: Never true    Ran Out of Food in the Last Year: Never true  Transportation Needs: No Transportation Needs (06/04/2022)   PRAPARE - Administrator, Civil Service (Medical): No    Lack of Transportation (Non-Medical): No  Physical Activity: Inactive (06/04/2022)   Exercise Vital Sign    Days of Exercise per Week: 0 days    Minutes of Exercise per Session: 0 min  Stress: No Stress Concern Present (06/04/2022)   [employer] of Occupational Health - Occupational Stress Questionnaire    Feeling of Stress : Not at all  Social Connections: Moderately Integrated (06/04/2022)   Social Connection and Isolation Panel    Frequency of  Communication with Friends and Family: More than three times a week    Frequency of Social Gatherings with Friends and Family: More than three times a week    Attends Religious Services: Never    Database administrator or Organizations: Yes    Attends Banker  Meetings: More than 4 times per year    Marital Status: Married     Family History: The patient's family history includes Aneurysm (age of onset: 82) in his mother; CAD (age of onset: 32) in his father; Dementia in an other family member; Healthy in his brother; Heart disease in his sister. ROS:   Please see the history of present illness.    All 14 point review of systems negative except as described per history of present illness  EKGs/Labs/Other Studies Reviewed:    EKG Interpretation Date/Time:  Friday May 29 2024 15:53:41 EDT Ventricular Rate:  81 PR Interval:  156 QRS Duration:  112 QT Interval:  414 QTC Calculation: 480 R Axis:   49  Text Interpretation: Normal sinus rhythm Possible Left atrial enlargement Prolonged QT Abnormal ECG When compared with ECG of 03-Oct-2023 13:58, PREVIOUS ECG IS PRESENT Confirmed by Bernie Charleston [phone]) on 05/29/2024 4:52:28 PM    Recent Labs: 03/03/2024: ALT 17; BUN 14; Creatinine, Ser 1.02; Hemoglobin 13.2; Platelets 124; Potassium 4.0; Sodium 142; TSH 0.665  Recent Lipid Panel    Component Value Date/Time   CHOL 127 03/03/2024 0737   TRIG 97 03/03/2024 0737   HDL 55 03/03/2024 0737   CHOLHDL 2.3 03/03/2024 0737   LDLCALC 54 03/03/2024 0737    Physical Exam:    VS:  BP 100/70   Pulse 81   Ht 5' 10 (1.778 m)   Wt 181 lb (82.1 kg)   SpO2 93%   BMI 25.97 kg/m     Wt Readings from Last 3 Encounters:  05/29/24 181 lb (82.1 kg)  03/26/24 182 lb 9.6 oz (82.8 kg)  03/18/24 178 lb (80.7 kg)     GEN:  Well nourished, well developed in no acute distress HEENT: Normal NECK: No JVD; No carotid bruits LYMPHATICS: No lymphadenopathy CARDIAC: RRR, no murmurs,  no rubs, no gallops RESPIRATORY:  Clear to auscultation without rales, wheezing or rhonchi  ABDOMEN: Soft, non-tender, non-distended MUSCULOSKELETAL:  No edema; No deformity  SKIN: Warm and dry LOWER EXTREMITIES: no swelling NEUROLOGIC:  Alert and oriented x 3 PSYCHIATRIC:  Normal affect   ASSESSMENT:    1. Primary hypertension   2. Preoperative clearance   3. Coronary artery disease involving coronary bypass graft of native heart with angina pectoris   4. Obstructive sleep apnea   5. Lumbar radiculopathy    PLAN:    In order of problems listed above:  Coronary disease.  Stable no recent events.  Last cardiac catheterization November of last year showing no target lesion for interventions, will continue present management which includes antiplatelet therapy. Cardiovascular preop evaluation.  Stable from cardiac point of view to proceed.  He is able to do 4 METS no recent symptoms.  Last cardiac catheterization reviewed, he is on dual antiplatelet therapy.  This is more than 6 months after event, therefore we will be able to hold Plavix  before surgery would be advisable to keep aspirin  on board during the surgery but if surgeon thinks it is from surgical point review it is prohibitive should be fine to stop aspirin  for few days before surgery and restart both medication as quickly as possible after that. Obstructive sleep apnea that is being managed by antimedicine team. Essential hypertension blood pressure well-controlled.   Medication Adjustments/Labs and Tests Ordered: Current medicines are reviewed at length with the patient today.  Concerns regarding medicines are outlined above.  Orders Placed This Encounter  Procedures   EKG 12-Lead   Medication  changes: No orders of the defined types were placed in this encounter.   Signed, Jon DOROTHA Fitch, MD, [facility] 05/29/2024 4:53 PM    [facility]

## 2024-05-29 NOTE — Patient Instructions (Signed)
 Medication Instructions:  Your physician recommends that you continue on your current medications as directed. Please refer to the Current Medication list given to you today.  *If you need a refill on your cardiac medications before your next appointment, please call your pharmacy*   Lab Work: None Ordered If you have labs (blood work) drawn today and your tests are completely normal, you will receive your results only by: MyChart Message (if you have MyChart) OR A paper copy in the mail If you have any lab test that is abnormal or we need to change your treatment, we will call you to review the results.   Testing/Procedures: None Ordered   Follow-Up: At [facility], you and your health needs are our priority.  As part of our continuing mission to provide you with exceptional heart care, we have created designated Provider Care Teams.  These Care Teams include your primary Cardiologist (physician) and Advanced Practice Providers (APPs -  Physician Assistants and Nurse Practitioners) who all work together to provide you with the care you need, when you need it.  We recommend signing up for the patient portal called MyChart.  Sign up information is provided on this After Visit Summary.  MyChart is used to connect with patients for Virtual Visits (Telemedicine).  Patients are able to view lab/test results, encounter notes, upcoming appointments, etc.  Non-urgent messages can be sent to your provider as well.   To learn more about what you can do with MyChart, go to [email or web address].    Your next appointment:   4 month(s)  The format for your next appointment:   In Person  Provider:   Lamar Fitch, MD    Other Instructions NA

## 2024-05-30 ENCOUNTER — Other Ambulatory Visit (HOSPITAL_COMMUNITY)

## 2024-05-30 ENCOUNTER — Encounter (HOSPITAL_COMMUNITY): Payer: Self-pay

## 2024-05-30 ENCOUNTER — Emergency Department (HOSPITAL_COMMUNITY)

## 2024-05-30 ENCOUNTER — Emergency Department (HOSPITAL_COMMUNITY)
Admission: EM | Admit: 2024-05-30 | Discharge: 2024-05-30 | Disposition: A | Discharge status: Home / Self Care | Attending: Emergency Medicine | Admitting: Emergency Medicine

## 2024-05-30 ENCOUNTER — Other Ambulatory Visit (HOSPITAL_COMMUNITY): Payer: Self-pay

## 2024-05-30 ENCOUNTER — Other Ambulatory Visit: Payer: Self-pay

## 2024-05-30 DIAGNOSIS — R29818 Other symptoms and signs involving the nervous system: Secondary | ICD-10-CM | POA: Diagnosis not present

## 2024-05-30 DIAGNOSIS — I639 Cerebral infarction, unspecified: Secondary | ICD-10-CM | POA: Diagnosis not present

## 2024-05-30 DIAGNOSIS — R110 Nausea: Secondary | ICD-10-CM | POA: Diagnosis not present

## 2024-05-30 DIAGNOSIS — I4510 Unspecified right bundle-branch block: Secondary | ICD-10-CM | POA: Diagnosis not present

## 2024-05-30 DIAGNOSIS — Z7982 Long term (current) use of aspirin: Secondary | ICD-10-CM | POA: Insufficient documentation

## 2024-05-30 DIAGNOSIS — H532 Diplopia: Secondary | ICD-10-CM | POA: Diagnosis not present

## 2024-05-30 DIAGNOSIS — R42 Dizziness and giddiness: Secondary | ICD-10-CM

## 2024-05-30 DIAGNOSIS — H538 Other visual disturbances: Secondary | ICD-10-CM | POA: Diagnosis not present

## 2024-05-30 DIAGNOSIS — I10 Essential (primary) hypertension: Secondary | ICD-10-CM | POA: Diagnosis not present

## 2024-05-30 DIAGNOSIS — R531 Weakness: Secondary | ICD-10-CM | POA: Diagnosis not present

## 2024-05-30 LAB — COMPREHENSIVE METABOLIC PANEL WITH GFR
ALT: 28 U/L (ref 0–44)
AST: 29 U/L (ref 15–41)
Albumin: 3.7 g/dL (ref 3.5–5.0)
Alkaline Phosphatase: 38 U/L (ref 38–126)
Anion gap: 9 (ref 5–15)
BUN: 10 mg/dL (ref 8–23)
CO2: 25 mmol/L (ref 22–32)
Calcium: 8.8 mg/dL — ABNORMAL LOW (ref 8.9–10.3)
Chloride: 106 mmol/L (ref 98–111)
Creatinine, Ser: 1.17 mg/dL (ref 0.61–1.24)
GFR, Estimated: >60 mL/min (ref >60)
Glucose, Bld: 97 mg/dL (ref 70–99)
Potassium: 3.9 mmol/L (ref 3.5–5.1)
Sodium: 140 mmol/L (ref 135–145)
Total Bilirubin: 1.2 mg/dL (ref 0.0–1.2)
Total Protein: 6.7 g/dL (ref 6.5–8.1)

## 2024-05-30 LAB — TROPONIN I (HIGH SENSITIVITY)
Troponin I (High Sensitivity): 5 ng/L (ref <18)
Troponin I (High Sensitivity): 5 ng/L (ref <18)

## 2024-05-30 LAB — CBC
HCT: 41.2 % (ref 39.0–52.0)
Hemoglobin: 14.2 g/dL (ref 13.0–17.0)
MCH: 34 pg (ref 26.0–34.0)
MCHC: 34.5 g/dL (ref 30.0–36.0)
MCV: 98.6 fL (ref 80.0–100.0)
Platelets: 129 K/uL — ABNORMAL LOW (ref 150–400)
RBC: 4.18 MIL/uL — ABNORMAL LOW (ref 4.22–5.81)
RDW: 13.6 % (ref 11.5–15.5)
WBC: 5.1 K/uL (ref 4.0–10.5)
nRBC: 0 % (ref 0.0–0.2)

## 2024-05-30 LAB — ETHANOL: Alcohol, Ethyl (B): <15 mg/dL (ref <15)

## 2024-05-30 LAB — I-STAT CHEM 8, ED
BUN: 11 mg/dL (ref 8–23)
Calcium, Ion: 1.08 mmol/L — ABNORMAL LOW (ref 1.15–1.40)
Chloride: 104 mmol/L (ref 98–111)
Creatinine, Ser: 1.2 mg/dL (ref 0.61–1.24)
Glucose, Bld: 93 mg/dL (ref 70–99)
HCT: 40 % (ref 39.0–52.0)
Hemoglobin: 13.6 g/dL (ref 13.0–17.0)
Potassium: 3.9 mmol/L (ref 3.5–5.1)
Sodium: 142 mmol/L (ref 135–145)
TCO2: 27 mmol/L (ref 22–32)

## 2024-05-30 LAB — DIFFERENTIAL
Abs Immature Granulocytes: 0.03 K/uL (ref 0.00–0.07)
Basophils Absolute: 0 K/uL (ref 0.0–0.1)
Basophils Relative: 1 %
Eosinophils Absolute: 0.1 K/uL (ref 0.0–0.5)
Eosinophils Relative: 3 %
Immature Granulocytes: 1 %
Lymphocytes Relative: 27 %
Lymphs Abs: 1.4 K/uL (ref 0.7–4.0)
Monocytes Absolute: 0.5 K/uL (ref 0.1–1.0)
Monocytes Relative: 9 %
Neutro Abs: 3.1 K/uL (ref 1.7–7.7)
Neutrophils Relative %: 59 %

## 2024-05-30 LAB — APTT: aPTT: 27 s (ref 24–36)

## 2024-05-30 LAB — CBG MONITORING, ED: Glucose-Capillary: 95 mg/dL (ref 70–99)

## 2024-05-30 LAB — D-DIMER, QUANTITATIVE: D-Dimer, Quant: 0.49 ug{FEU}/mL (ref 0.00–0.50)

## 2024-05-30 LAB — PROTIME-INR
INR: 1 (ref 0.8–1.2)
Prothrombin Time: 13.6 s (ref 11.4–15.2)

## 2024-05-30 MED ORDER — SODIUM CHLORIDE 0.9% FLUSH
3.0000 mL | Freq: Once | INTRAVENOUS | Status: AC
  Administered 2024-05-30: 3 mL via INTRAVENOUS

## 2024-05-30 MED ORDER — LORAZEPAM 1 MG PO TABS
1.0000 mg | ORAL_TABLET | Freq: Once | ORAL | Status: AC
  Administered 2024-05-30: 1 mg via ORAL
  Filled 2024-05-30: qty 1

## 2024-05-30 NOTE — ED Notes (Signed)
 Patient wife approaches nurse's station and states I need a doctor to see my husband. He's just started having chest pain and his EKG looks a little funny. Upon across the room assessment, patient not in distress and denies chest pain at this time. EKG handed off to MD.

## 2024-05-30 NOTE — ED Provider Notes (Signed)
 [place] EMERGENCY DEPARTMENT AT [facility] Provider Note   CSN: [id number] Arrival date & time: 05/30/24  1410     Patient presents with: Code Stroke   Jon Gonzales is a 63 y.o. male.   63 year old male with prior medical history as detailed below presents for evaluation.  Patient with reported double vision.  Code stroke initiated by EMS.  Patient taken directly to CT with stroke team.  Dr. Merrianne evaluating patient.  Patient reports that he awoke with dizziness and double vision.  He reports that this is typical when he has a Mnire's attack.  He took 2 meclizine  without improvement.  He decided to come to the ED given the length of his symptoms.  He reports that typically the meclizine  causes resolution of his symptoms.  He denies other complaint.   The history is provided by the patient and medical records.       Prior to Admission medications   Medication Sig Start Date End Date Taking? Authorizing Provider  acetaminophen  (TYLENOL ) 500 MG tablet Take 500-1,000 mg by mouth every 6 (six) hours as needed for moderate pain (pain score 4-6).    [provider]  aspirin  EC (ASPIRIN  LOW DOSE) 81 MG tablet Take 1 tablet (81 mg total) by mouth daily. 04/30/24   Henry Manuelita NOVAK, NP  cariprazine  (VRAYLAR ) 3 MG capsule Take 1 capsule (3 mg total) by mouth daily. 05/29/24   Cottle, Lorene KANDICE Raddle., MD  clopidogrel  (PLAVIX ) 75 MG tablet Take 1 tablet (75 mg total) by mouth daily. 12/23/23   Nicholaus Credit, PA-C  cyclobenzaprine  (FLEXERIL ) 10 MG tablet Take 1 tablet (10 mg total) by mouth 3 (three) times daily as needed for muscle spasms. 02/14/24   Lanis Pupa, MD  DULoxetine  (CYMBALTA ) 60 MG capsule Take 1 capsule (60 mg total) by mouth 2 (two) times daily. 03/02/24   Cottle, Lorene KANDICE Raddle., MD  ezetimibe  (ZETIA ) 10 MG tablet Take 1 tablet (10 mg total) by mouth daily. 04/30/24   Nicholaus Credit, PA-C  fexofenadine  (ALLEGRA ) 60 MG tablet Take 1 tablet (60 mg total) by mouth  daily. 10/03/22   Abran Jerilynn Loving, MD  gabapentin  (NEURONTIN ) 300 MG capsule Take 1 capsule (300 mg total) by mouth every evening. 04/01/24     hydrochlorothiazide  (HYDRODIURIL ) 12.5 MG tablet Take 1 tablet (12.5 mg total) by mouth daily. 03/16/24   Sirivol, Mamatha, MD  isosorbide  mononitrate (IMDUR ) 60 MG 24 hr tablet Take 2 tablets (120 mg total) by mouth daily. 04/30/24   Nicholaus Credit, PA-C  LORazepam  (ATIVAN ) 1 MG tablet Take 1 tablet (1 mg total) by mouth every 8 (eight) hours as needed for anxiety. 04/28/24   Teresa Redell LABOR, NP  meclizine  (ANTIVERT ) 25 MG tablet Take 1 tablet (25 mg total) by mouth 3 (three) times daily as needed for dizziness. 10/03/22   Abran Jerilynn Loving, MD  metoprolol  succinate (TOPROL -XL) 50 MG 24 hr tablet Take 1 tablet by mouth daily. Take with or immediately following a meal. 03/13/24   Nicholaus Credit, PA-C  Multiple Vitamin (MULTIVITAMIN WITH MINERALS) TABS tablet Take 1 tablet by mouth daily. Men's Multivitamin    [provider]  nitroGLYCERIN  (NITROSTAT ) 0.4 MG SL tablet Dissolve 1 tablet (0.4 mg total) under the tongue every 5 (five) minutes up to 3 doses as needed for chest pain. If no relief after 3 doses call 911. 09/04/23   Nicholaus Credit, PA-C  ondansetron  (ZOFRAN ) 4 MG tablet Take 1 tablet (4 mg total)  by mouth every 8 (eight) hours as needed for nausea or vomiting. 10/13/22   Charlene Clotilda PARAS, NP  pantoprazole  (PROTONIX ) 40 MG tablet Take 1 tablet (40 mg total) by mouth daily. 12/23/23   Nicholaus Credit, PA-C  ranolazine  (RANEXA ) 1000 MG SR tablet Take 1 tablet by mouth 2 times daily. 08/29/23   Henry Manuelita NOVAK, NP  rosuvastatin  (CRESTOR ) 40 MG tablet Take 1 tablet (40 mg total) by mouth daily. 12/23/23   Nicholaus Credit, PA-C  Semaglutide -Weight Management (WEGOVY ) 2.4 MG/0.75ML SOAJ Inject 2.4 mg into the skin once a week. 05/04/24   Teressa Harrie HERO, FNP  Solriamfetol  HCl (SUNOSI ) 150 MG TABS Take 1 tablet (150 mg total) by mouth every morning. 03/04/24    Dohmeier, Dedra, MD  diltiazem  (CARDIZEM  SR) 60 MG 12 hr capsule Take 1 capsule (60 mg total) by mouth 2 (two) times daily. 05/02/20 11/07/20  Abran Jerilynn Loving, MD  pantoprazole  (PROTONIX ) 40 MG tablet Take 1 tablet (40 mg total) by mouth daily. 01/17/22   Charlene Clotilda PARAS, NP  triamterene -hydrochlorothiazide  (DYAZIDE) 37.5-25 MG capsule Take 1 each (1 capsule total) by mouth daily. 08/08/20 10/25/20  Abran Jerilynn Loving, MD    Allergies: Trintellix [vortioxetine] and Penicillins    Review of Systems  All other systems reviewed and are negative.   Updated Vital Signs BP (!) 168/112   Resp 20   Wt 79.8 kg   SpO2 100%   BMI 25.24 kg/m   Physical Exam Vitals and nursing note reviewed.  Constitutional:      General: He is not in acute distress.    Appearance: Normal appearance. He is well-developed.  HENT:     Head: Normocephalic and atraumatic.  Eyes:     Conjunctiva/sclera: Conjunctivae normal.     Pupils: Pupils are equal, round, and reactive to light.  Cardiovascular:     Rate and Rhythm: Normal rate and regular rhythm.     Heart sounds: Normal heart sounds.  Pulmonary:     Effort: Pulmonary effort is normal. No respiratory distress.     Breath sounds: Normal breath sounds.  Abdominal:     General: There is no distension.     Palpations: Abdomen is soft.     Tenderness: There is no abdominal tenderness.  Musculoskeletal:        General: No deformity. Normal range of motion.     Cervical back: Normal range of motion and neck supple.  Skin:    General: Skin is warm and dry.  Neurological:     General: No focal deficit present.     Mental Status: He is alert and oriented to person, place, and time. Mental status is at baseline.     Motor: No weakness.     Comments: Patient reports double vision     (all labs ordered are listed, but only abnormal results are displayed) Labs Reviewed  I-STAT CHEM 8, ED - Abnormal; Notable for the following components:       Result Value   Calcium , Ion 1.08 (*)    All other components within normal limits  PROTIME-INR  APTT  CBC  DIFFERENTIAL  COMPREHENSIVE METABOLIC PANEL WITH GFR  ETHANOL  CBG MONITORING, ED    EKG: None  Radiology: No results found.   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  Symptoms began early this morning when he awoke.  They have persisted longer than normal.  He took 2 meclizine  tablets.  Symptoms did not improve.  He therefore came to the ED.  Neuro team is evaluating patient for acute stroke.  Oncoming EDP aware of case.  Amount and/or Complexity of Data Reviewed Labs: ordered. Radiology: ordered.  Risk Prescription drug management.        Final diagnoses:  Vertigo  RBBB    ED Discharge Orders     None          Laurice Maude BROCKS, MD 05/31/24 1601

## 2024-05-30 NOTE — Consult Note (Signed)
 NEUROLOGY CONSULT NOTE   Date of service: May 30, 2024 Patient Name: Jon Gonzales MRN:  [id number] DOB:  22-Feb-1961 Chief Complaint: Double vision Requesting Provider: Laurice Maude BROCKS, MD  History of Present Illness  [name] is a 63 y.o. male who  has a past medical history of Abnormal EKG (06/13/2023), Abnormal stress test small area of mild ischemia inferior wall (05/24/2022), Aphasia (06/13/2023), Arthropathy of lumbar facet joint (07/03/2023), Bilateral sacroiliitis (HCC) (08/29/2023), Bipolar 1 disorder (02/04/2023), Chronic bilateral low back pain with right-sided sciatica (06/24/2023), Chronic headaches (01/30/2021), Class 2 obesity due to excess calories without serious comorbidity in adult (09/11/2016), Confusional arousals (09/11/2016), Coronary artery disease involving coronary bypass graft of native heart with angina pectoris (05/22/2016), Degeneration of lumbar intervertebral disc (07/03/2023), Dizziness (10/27/2020), Excessive daytime sleepiness (09/11/2016), Gastroesophageal reflux disease (02/04/2023), Generalized anxiety disorder (05/22/2016), Herpes zoster (01/20/2020), History of small bowel obstruction, Hypercholesterolemia (05/22/2016), Hyperesthesia (11/29/2021), Hypertension (05/22/2016), Hypogonadism in male (05/22/2016), Hypotension (11/07/2020), Long-term current use of lithium  (02/04/2023), Low testosterone , Lumbar radiculopathy (07/03/2023), Major depressive disorder (09/11/2016), Mild cognitive impairment of uncertain or unknown etiology (03/21/2023), Morton neuroma, left (09/18/2022), Nephrolithiasis, Neurological abnormality (06/13/2023), Obstructive sleep apnea (09/11/2016), 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 and Meniere's disease who presents with what he states is an unusually long bout of symptoms related to his Meniere's disease, consisting of binocular double vision, gait unsteadiness and dizziness. He states that he typically experiences double vision with his Meniere's attacks but that this time it lasted long enough to make him think that he might be having a stroke. Last known normal was 2000 yesterday, when he went to bed. He woke up with the symptoms. BP per EMS was 160/70. Code Stroke was called en route.   Home medications include ASA, Plavix  and rosuvastatin .   LKW: 2000 on Friday Modified rankin score: 0 IV Thrombolysis:  No: Outside of the time window EVT: No: Presentation is not consistent with LVO   NIHSS components Score: Comment  1a Level of Conscious 0[x]  1[]  2[]  3[]      1b LOC Questions 0[x]  1[]  2[]       1c LOC Commands 0[x]  1[]  2[]       2 Best Gaze 0[x]  1[]  2[]       3 Visual 0[x]  1[]  2[]  3[]      4 Facial Palsy 0[x]  1[]  2[]  3[]      5a Motor Arm - left 0[x]  1[]  2[]  3[]  4[]  UN[]    5b Motor Arm - Right 0[x]  1[]  2[]  3[]  4[]  UN[]    6a Motor Leg - Left 0[]  1[]  2[x]  3[]  4[]  UN[]    6b Motor Leg - Right 0[]  1[]  2[x]  3[]  4[]  UN[]    7 Limb Ataxia 0[x]  1[]  2[]  UN[]      8 Sensory 0[x]  1[]  2[]  UN[]      9 Best Language 0[x]  1[]  2[]  3[]      10 Dysarthria 0[x]  1[]  2[]  UN[]      11 Extinct. and Inattention 0[x]  1[]  2[]       TOTAL:   4      ROS  Comprehensive ROS performed and pertinent positives documented in HPI    Past History   Past Medical History:  Diagnosis Date   Abnormal EKG 06/13/2023   Abnormal stress test small area of mild ischemia inferior wall 05/24/2022   Aphasia 06/13/2023  Arthropathy of lumbar facet joint 07/03/2023   Bilateral sacroiliitis (HCC) 08/29/2023   Bipolar 1 disorder 02/04/2023   Chronic bilateral low back pain with right-sided sciatica 06/24/2023   Chronic headaches 01/30/2021   Class 2 obesity due to  excess calories without serious comorbidity in adult 09/11/2016   Confusional arousals 09/11/2016   Coronary artery disease involving coronary bypass graft of native heart with angina pectoris 05/22/2016   Cath 2013 LAD 95% stenosis, D1 90% stenosis, RCA 20%, Circ 30%. Previous stent to D1 2012. EF 60%.   Degeneration of lumbar intervertebral disc 07/03/2023   Dizziness 10/27/2020   Excessive daytime sleepiness 09/11/2016   Gastroesophageal reflux disease 02/04/2023   Generalized anxiety disorder 05/22/2016   Herpes zoster 01/20/2020   History of small bowel obstruction    Admitted 10-14-2007 for partical small bowel obstruction and N.G. tube was placed. Admitted by Dr Gerard. 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 Small internal hemorrhoids. Otherwise normal colonoscopy to terminal ileum   CORONARY ARTERY BYPASS GRAFT  02/19/2012   L - LAD, SVG - D1, SVG - OM   CORONARY STENT INTERVENTION N/A 11/05/2019   Procedure: CORONARY STENT INTERVENTION;  Surgeon: Court Dorn PARAS,  MD;  Location: [facility];  Service: Cardiovascular;  Laterality: N/A;  SVG to DIAG   ESOPHAGOGASTRODUODENOSCOPY  01/08/2007   Multiple duodenal ulcers with stenosis/stricture of the distal first portion of the duodenum (measuring approximantely 9mm) Mild gastritis.   LEFT HEART CATH AND CORS/GRAFTS ANGIOGRAPHY N/A 11/05/2019   Procedure: LEFT HEART CATH AND CORS/GRAFTS ANGIOGRAPHY;  Surgeon: Court Dorn PARAS, MD;  Location: [facility];  Service: Cardiovascular;  Laterality: N/A;   LEFT HEART CATH AND CORS/GRAFTS ANGIOGRAPHY N/A 08/29/2023   Procedure: LEFT HEART CATH AND CORS/GRAFTS ANGIOGRAPHY;  Surgeon: Verlin Lonni BIRCH, MD;  Location: [facility];  Service: Cardiovascular;  Laterality: N/A;   LUMBAR LAMINECTOMY/DECOMPRESSION MICRODISCECTOMY Left 02/14/2024   Procedure: LUMBAR LAMINECTOMY/DECOMPRESSION MICRODISCECTOMY LEFT LUMBAR TWO-LUMBAR THREE;  Surgeon: Lanis Pupa, MD;  Location: [facility] OR;  Service: Neurosurgery;  Laterality: Left;  MICRODISCECTOMY, LT, L23   RENAL ANGIOGRAPHY N/A 11/05/2019   Procedure: RENAL ANGIOGRAPHY;  Surgeon: Court Dorn PARAS, MD;  Location: [facility];  Service: Cardiovascular;  Laterality: N/A;   TONSILLECTOMY AND ADENOIDECTOMY     WRIST SURGERY Right     Family History: Family History  Problem Relation Age of Onset   Aneurysm Mother 22       cerebral   CAD Father 43   Heart disease Sister        Unclear  Healthy Brother    Dementia Other        several individuals on extended paternal side of family    Social History  reports that he has never smoked. He has never used smokeless tobacco. He reports current alcohol use of about 14.0 standard drinks of alcohol per week. He reports that he does not use drugs.  Allergies  Allergen Reactions   Trintellix [Vortioxetine] Other (See Comments)    Headaches.   Penicillins Rash    Medications  No current facility-administered medications for this  encounter.  Current Outpatient Medications:    acetaminophen  (TYLENOL ) 500 MG tablet, Take 500-1,000 mg by mouth every 6 (six) hours as needed for moderate pain (pain score 4-6)., Disp: , Rfl:    aspirin  EC (ASPIRIN  LOW DOSE) 81 MG tablet, Take 1 tablet (81 mg total) by mouth daily., Disp: 90 tablet, Rfl: 0   cariprazine  (VRAYLAR ) 3 MG capsule, Take 1 capsule (3 mg total) by mouth daily., Disp: 90 capsule, Rfl: 0   clopidogrel  (PLAVIX ) 75 MG tablet, Take 1 tablet (75 mg total) by mouth daily., Disp: 90 tablet, Rfl: 1   cyclobenzaprine  (FLEXERIL ) 10 MG tablet, Take 1 tablet (10 mg total) by mouth 3 (three) times daily as needed for muscle spasms., Disp: 30 tablet, Rfl: 0   DULoxetine  (CYMBALTA ) 60 MG capsule, Take 1 capsule (60 mg total) by mouth 2 (two) times daily., Disp: 180 capsule, Rfl: 1   ezetimibe  (ZETIA ) 10 MG tablet, Take 1 tablet (10 mg total) by mouth daily., Disp: 90 tablet, Rfl: 1   fexofenadine  (ALLEGRA ) 60 MG tablet, Take 1 tablet (60 mg total) by mouth daily., Disp: 90 tablet, Rfl: 2   gabapentin  (NEURONTIN ) 300 MG capsule, Take 1 capsule (300 mg total) by mouth every evening., Disp: 30 capsule, Rfl: 3   hydrochlorothiazide  (HYDRODIURIL ) 12.5 MG tablet, Take 1 tablet (12.5 mg total) by mouth daily., Disp: 30 tablet, Rfl: 11   isosorbide  mononitrate (IMDUR ) 60 MG 24 hr tablet, Take 2 tablets (120 mg total) by mouth daily., Disp: 180 tablet, Rfl: 1   LORazepam  (ATIVAN ) 1 MG tablet, Take 1 tablet (1 mg total) by mouth every 8 (eight) hours as needed for anxiety., Disp: 90 tablet, Rfl: 0   meclizine  (ANTIVERT ) 25 MG tablet, Take 1 tablet (25 mg total) by mouth 3 (three) times daily as needed for dizziness., Disp: 90 tablet, Rfl: 2   metoprolol  succinate (TOPROL -XL) 50 MG 24 hr tablet, Take 1 tablet by mouth daily. Take with or immediately following a meal., Disp: 90 tablet, Rfl: 1   Multiple Vitamin (MULTIVITAMIN WITH MINERALS) TABS tablet, Take 1 tablet by mouth daily. Men's  Multivitamin, Disp: , Rfl:    nitroGLYCERIN  (NITROSTAT ) 0.4 MG SL tablet, Dissolve 1 tablet (0.4 mg total) under the tongue every 5 (five) minutes up to 3 doses as needed for chest pain. If no relief after 3 doses call 911., Disp: 25 tablet, Rfl: 1   ondansetron  (ZOFRAN ) 4 MG tablet, Take 1 tablet (4 mg total) by mouth every 8 (eight) hours as needed for nausea or vomiting., Disp: 20 tablet, Rfl: 0   pantoprazole  (PROTONIX ) 40 MG tablet, Take 1 tablet (40 mg total) by mouth daily., Disp: 90 tablet, Rfl: 1   ranolazine  (RANEXA ) 1000 MG SR tablet, Take 1 tablet by mouth 2 times daily., Disp: 180 tablet, Rfl: 2   rosuvastatin  (CRESTOR ) 40 MG tablet, Take 1 tablet (40 mg total) by mouth daily., Disp: 90 tablet, Rfl: 1  Semaglutide -Weight Management (WEGOVY ) 2.4 MG/0.75ML SOAJ, Inject 2.4 mg into the skin once a week., Disp: 3 mL, Rfl: 1   Solriamfetol  HCl (SUNOSI ) 150 MG TABS, Take 1 tablet (150 mg total) by mouth every morning., Disp: 90 tablet, Rfl: 1  Vitals   Vitals:   09-Jun-2024 1417 Jun 09, 2024 1417 06/09/24 1423  BP:  (!) 168/112 (!) 143/99  Pulse:   74  Resp:  20 18  SpO2:  100% 99%  Weight: 79.8 kg      Body mass index is 25.24 kg/m.   Physical Exam   Constitutional: Appears well-developed and well-nourished.  Psych: Dysthymic affect Eyes: No scleral injection.  HENT: No OP obstruction.  Head: Normocephalic.  Respiratory: Effort normal, non-labored breathing.    Neurologic Examination   See NIHSS  Labs/Imaging/Neurodiagnostic studies   CBC:  Recent Labs  Lab 06-09-2024 1411 06/09/2024 1418  WBC 5.1  --   NEUTROABS 3.1  --   HGB 14.2 13.6  HCT 41.2 40.0  MCV 98.6  --   PLT 129*  --    Basic Metabolic Panel:  Lab Results  Component Value Date   NA 142 2024/06/09   K 3.9 06/09/2024   CO2 21 03/03/2024   GLUCOSE 93 2024-06-09   BUN 11 2024/06/09   CREATININE 1.20 2024-06-09   CALCIUM  8.8 03/03/2024   GFRNONAA >60 02/03/2024   GFRAA 77 11/15/2020   Lipid Panel:   Lab Results  Component Value Date   LDLCALC 54 03/03/2024   HgbA1c:  Lab Results  Component Value Date   HGBA1C 5.2 03/03/2024   Urine Drug Screen:     Component Value Date/Time   LABOPIA NONE DETECTED 06/12/2023 1120   COCAINSCRNUR NONE DETECTED 06/12/2023 1120   LABBENZ NONE DETECTED 06/12/2023 1120   AMPHETMU NONE DETECTED 06/12/2023 1120   THCU NONE DETECTED 06/12/2023 1120   LABBARB NONE DETECTED 06/12/2023 1120    Alcohol Level     Component Value Date/Time   ETH <10 10/03/2023 1336   INR  Lab Results  Component Value Date   INR 1.0 10/03/2023   APTT  Lab Results  Component Value Date   APTT 25 10/03/2023     ASSESSMENT  Zacary Bauer Luscher is a 63 y.o. male with an extensive PMHx as documented above including Meniere's disease who presents with what he states is an unusually long bout of symptoms related to an exacerbation of his Meniere's disease, consisting of binocular double vision, gait unsteadiness and dizziness. He states that he typically experiences double vision with his Meniere's attacks but that this time it lasted long enough to make him think that he might be having a stroke. Last known normal was 2000 yesterday, when he went to bed. He woke up with the symptoms. BP per EMS was 160/70. Code Stroke was called en route.  - Exam reveals BLE weakness with poor effort and giveway weakness as well as inconsistencies suggestive of a possible functional component. NIHSS 4.  - CT head: No evidence of acute intracranial abnormality. ASPECTS of 10.  - Impression: DDx for the patient's presentation includes acute vertigo in the context of his history of Meniere's disease, psychogenic pseudostroke and possible small acute ischemic infarction.  RECOMMENDATIONS  - MRI brain - Can be discharged home with outpatient ENT follow up if MRI brain is negative.  ______________________________________________________________________    Bonney SHARK, Laynee Lockamy, MD [facility]

## 2024-05-30 NOTE — ED Triage Notes (Addendum)
 Patient bib [facility] coming from home. Pt activated as code stroke with LWK being 2000 yesterday. Pt woke up this morning around 0930 with symptoms of double vision, dizziness, and inability to stand or walk. EMS states the patient reports he sometimes does have these symptoms but they usually resolve quicker than they have today. Pt is on plavix  and does have cardiac hx. Patient is alert and oriented x4 during triage with respirations regular and unlabored.   EMS: 154/109 71 HR 96% RA 91 cbg

## 2024-05-30 NOTE — ED Provider Notes (Signed)
 Pt signed out by Dr. Laurice pending improvement and MRI.  MRI reviewed by me.  I agree with the radiologist.  No acute intracranial abnormality.  2. Multifocal hyperintense T2-weighted signal within the cerebral white matter,  most commonly due to chronic small vessel disease.    Electronically signed by: Franky Stanford MD 05/30/2024 0    Pt does have a new RBBB on EKG.  This is new from yesterday.  Pt's trop and ddimer neg.  Pt will f/u with cards regarding this finding.  Pt is feeling much better.  He is able to ambulate.  He is stable for d/c.  Return if worse.    Dean Clarity, MD 05/30/24 2115

## 2024-05-30 NOTE — ED Notes (Signed)
 Patient ambulated initially with minimal assistance. Pt then able to ambulated without assistance on the way back into room. Pt denied dizziness/weakness.

## 2024-05-30 NOTE — Code Documentation (Signed)
 Stroke Response Nurse Documentation Code Documentation  OWYN RAULSTON is a 63 y.o. male arriving to [facility]  via [facility] on 05-30-2024 with past medical hx of HTN, CABG, tinnitus, chronic back pain, meniere's. On aspirin  81 mg daily and clopidogrel  75 mg daily. Code stroke was activated by EMS.   Patient from home where he was LKW at 2000 at 8/15 and now complaining of double vision and difficulty walking and feels thick tongue with some slurred speech.  He states that he has these symptoms with his meniere's but the symptoms usually only last about 2 hours and are relieved by meclizine .  He woke up with symptoms this morning about 0930.    Stroke team at the bedside on patient arrival. Labs drawn and patient cleared for CT by Dr. Laurice. Patient to CT with team. NIHSS 5, see documentation for details and code stroke times. Patient with bilateral leg weakness and dysarthria  on exam. The following imaging was completed:  CT Head. Patient is not a candidate for IV Thrombolytic due to lkw last night at 8pm. Patient is not a candidate for IR due to no LVO suspected.   Code Stroke canceled at 1430.    Bedside handoff with ED RN Jari Elvin [place]  Stroke Response RN

## 2024-05-30 NOTE — ED Notes (Signed)
 ED Provider at bedside.

## 2024-06-01 ENCOUNTER — Other Ambulatory Visit: Payer: Self-pay

## 2024-06-02 ENCOUNTER — Other Ambulatory Visit: Payer: Self-pay

## 2024-06-02 ENCOUNTER — Other Ambulatory Visit: Payer: Self-pay | Admitting: Cardiology

## 2024-06-02 ENCOUNTER — Other Ambulatory Visit (HOSPITAL_COMMUNITY): Payer: Self-pay

## 2024-06-02 ENCOUNTER — Telehealth (INDEPENDENT_AMBULATORY_CARE_PROVIDER_SITE_OTHER): Payer: Self-pay | Admitting: Otolaryngology

## 2024-06-02 NOTE — Telephone Encounter (Signed)
 Tried to call patient to reschedule appointment to Aug 28 per Dr. Tobie.  NA NVM

## 2024-06-03 ENCOUNTER — Ambulatory Visit (INDEPENDENT_AMBULATORY_CARE_PROVIDER_SITE_OTHER): Admitting: Physician Assistant

## 2024-06-03 ENCOUNTER — Other Ambulatory Visit: Payer: Self-pay | Admitting: Physician Assistant

## 2024-06-03 ENCOUNTER — Other Ambulatory Visit: Payer: Self-pay

## 2024-06-03 ENCOUNTER — Encounter: Payer: Self-pay | Admitting: Physician Assistant

## 2024-06-03 ENCOUNTER — Other Ambulatory Visit (HOSPITAL_COMMUNITY): Payer: Self-pay

## 2024-06-03 VITALS — BP 122/64 | HR 69 | Temp 98.1°F | Resp 16 | Ht 70.0 in | Wt 179.2 lb

## 2024-06-03 DIAGNOSIS — G8929 Other chronic pain: Secondary | ICD-10-CM | POA: Diagnosis not present

## 2024-06-03 DIAGNOSIS — M5441 Lumbago with sciatica, right side: Secondary | ICD-10-CM

## 2024-06-03 DIAGNOSIS — I25118 Atherosclerotic heart disease of native coronary artery with other forms of angina pectoris: Secondary | ICD-10-CM

## 2024-06-03 DIAGNOSIS — I10 Essential (primary) hypertension: Secondary | ICD-10-CM | POA: Diagnosis not present

## 2024-06-03 DIAGNOSIS — M5442 Lumbago with sciatica, left side: Secondary | ICD-10-CM | POA: Diagnosis not present

## 2024-06-03 MED ORDER — TRAMADOL HCL 50 MG PO TABS: 50.0000 mg | ORAL_TABLET | Freq: Three times a day (TID) | ORAL | 0 refills | Status: AC | PRN

## 2024-06-03 NOTE — Progress Notes (Signed)
 Subjective:  Patient ID: Jon Gonzales, male    DOB: 24-Aug-1961  Age: 63 y.o. MRN: [id number]  Chief Complaint  Patient presents with   Pre-op Exam    HPI Pt in today for pre-op exam for Dr Lanis with [facility].  He will be having lumber fusion under general anesthesia.  Pt had disc repair surgery in 5/25 and a few weeks later had a fall onto his back and has had lower back pain that radiates to both thighs since that time.  Pt denies any numbness or weakness to extremities.  He describes as a 'throbbing' type pain to his lower back. He is taking tramadol  which relieves the pain some. Pt has had cardiology clearance exam done last week as well because he has a history of CAD/hypertension.  They have given him guidelines on holding his ASA and plavix  Pt was seen in ED on 8/16 for vertigo/dizziness.  He will be seeing ENT next week for further evaluation At that time cbc and cmp were stable Pt voices no other problems at this time     03/18/2024   11:19 AM 02/28/2024   11:31 AM 06/11/2023    3:30 PM 04/18/2022   10:08 PM 01/17/2022    4:14 PM  Depression screen PHQ 2/9  Decreased Interest 3 2 1 3 3   Down, Depressed, Hopeless 3 1 2 3 2   PHQ - 2 Score 6 3 3 6 5   Altered sleeping 2 1 0 3 3  Tired, decreased energy 1 1 0 3 1  Change in appetite 0 0 0 2 1  Feeling bad or failure about yourself  3 1 1 3 3   Trouble concentrating 3 2 3 2 2   Moving slowly or fidgety/restless 1 1 0 0 0  Suicidal thoughts 1 1 2 1 1   PHQ-9 Score 17 10 9 20 16   Difficult doing work/chores Very difficult Somewhat difficult Somewhat difficult Somewhat difficult         08/28/2022    1:33 PM 09/18/2022    9:35 AM 06/11/2023    3:30 PM 09/20/2023    1:31 PM 03/18/2024   11:18 AM  Fall Risk  Falls in the past year? 0 0 1 0 1  Was there an injury with Fall? 0 0 0 0 0  Fall Risk Category Calculator 0 0 1 0 2  Fall Risk Category (Retired) Low  Low      (RETIRED) Patient Fall Risk Level Low fall  risk  Low fall risk      Patient at Risk for Falls Due to  No Fall Risks No Fall Risks  History of fall(s);No Fall Risks  Fall risk Follow up Falls evaluation completed  Falls evaluation completed  Falls evaluation completed  Falls evaluation completed     Data saved with a previous flowsheet row definition     ROS CONSTITUTIONAL: Negative for chills, fatigue, fever, unintentional weight gain and unintentional weight loss.  E/N/T: Negative for ear pain, nasal congestion and sore throat.  CARDIOVASCULAR: Negative for chest pain, dizziness, palpitations and pedal edema.  RESPIRATORY: Negative for recent cough and dyspnea.  GASTROINTESTINAL: Negative for abdominal pain, acid reflux symptoms, constipation, diarrhea, nausea and vomiting.  MSK: see HPI INTEGUMENTARY: Negative for rash.  NEUROLOGICAL: see HPI    Current Outpatient Medications:    acetaminophen  (TYLENOL ) 500 MG tablet, Take 500-1,000 mg by mouth every 6 (six) hours as needed for moderate pain (pain score 4-6)., Disp: , Rfl:  aspirin  EC (ASPIRIN  LOW DOSE) 81 MG tablet, Take 1 tablet (81 mg total) by mouth daily., Disp: 90 tablet, Rfl: 0   cariprazine  (VRAYLAR ) 3 MG capsule, Take 1 capsule (3 mg total) by mouth daily., Disp: 90 capsule, Rfl: 0   clopidogrel  (PLAVIX ) 75 MG tablet, Take 1 tablet (75 mg total) by mouth daily., Disp: 90 tablet, Rfl: 1   cyclobenzaprine  (FLEXERIL ) 10 MG tablet, Take 1 tablet (10 mg total) by mouth 3 (three) times daily as needed for muscle spasms., Disp: 30 tablet, Rfl: 0   DULoxetine  (CYMBALTA ) 60 MG capsule, Take 1 capsule (60 mg total) by mouth 2 (two) times daily., Disp: 180 capsule, Rfl: 1   ezetimibe  (ZETIA ) 10 MG tablet, Take 1 tablet (10 mg total) by mouth daily., Disp: 90 tablet, Rfl: 1   fexofenadine  (ALLEGRA ) 60 MG tablet, Take 1 tablet (60 mg total) by mouth daily., Disp: 90 tablet, Rfl: 2   gabapentin  (NEURONTIN ) 300 MG capsule, Take 1 capsule (300 mg total) by mouth every evening., Disp:  30 capsule, Rfl: 3   hydrochlorothiazide  (HYDRODIURIL ) 12.5 MG tablet, Take 1 tablet (12.5 mg total) by mouth daily., Disp: 30 tablet, Rfl: 11   isosorbide  mononitrate (IMDUR ) 60 MG 24 hr tablet, Take 2 tablets (120 mg total) by mouth daily., Disp: 180 tablet, Rfl: 1   LORazepam  (ATIVAN ) 1 MG tablet, Take 1 tablet (1 mg total) by mouth every 8 (eight) hours as needed for anxiety., Disp: 90 tablet, Rfl: 0   meclizine  (ANTIVERT ) 25 MG tablet, Take 1 tablet (25 mg total) by mouth 3 (three) times daily as needed for dizziness., Disp: 90 tablet, Rfl: 2   metoprolol  succinate (TOPROL -XL) 50 MG 24 hr tablet, Take 1 tablet by mouth daily. Take with or immediately following a meal., Disp: 90 tablet, Rfl: 1   Multiple Vitamin (MULTIVITAMIN WITH MINERALS) TABS tablet, Take 1 tablet by mouth daily. Men's Multivitamin, Disp: , Rfl:    nitroGLYCERIN  (NITROSTAT ) 0.4 MG SL tablet, Dissolve 1 tablet (0.4 mg total) under the tongue every 5 (five) minutes up to 3 doses as needed for chest pain. If no relief after 3 doses call 911., Disp: 25 tablet, Rfl: 1   ondansetron  (ZOFRAN ) 4 MG tablet, Take 1 tablet (4 mg total) by mouth every 8 (eight) hours as needed for nausea or vomiting., Disp: 20 tablet, Rfl: 0   pantoprazole  (PROTONIX ) 40 MG tablet, Take 1 tablet (40 mg total) by mouth daily., Disp: 90 tablet, Rfl: 1   ranolazine  (RANEXA ) 1000 MG SR tablet, Take 1 tablet by mouth 2 times daily., Disp: 180 tablet, Rfl: 2   rosuvastatin  (CRESTOR ) 40 MG tablet, Take 1 tablet (40 mg total) by mouth daily., Disp: 90 tablet, Rfl: 1   Semaglutide -Weight Management (WEGOVY ) 2.4 MG/0.75ML SOAJ, Inject 2.4 mg into the skin once a week., Disp: 3 mL, Rfl: 1   Solriamfetol  HCl (SUNOSI ) 150 MG TABS, Take 1 tablet (150 mg total) by mouth every morning., Disp: 90 tablet, Rfl: 1   traMADol  (ULTRAM ) 50 MG tablet, Take 1 tablet (50 mg total) by mouth every 8 (eight) hours as needed for up to 5 days., Disp: 15 tablet, Rfl: 0  Past Medical  History:  Diagnosis Date   Abnormal EKG 06/13/2023   Abnormal stress test small area of mild ischemia inferior wall 05/24/2022   Aphasia 06/13/2023   Arthropathy of lumbar facet joint 07/03/2023   Bilateral sacroiliitis (HCC) 08/29/2023   Bipolar 1 disorder 02/04/2023   Chronic bilateral low back  pain with right-sided sciatica 06/24/2023   Chronic headaches 01/30/2021   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   Coronary artery disease involving coronary bypass graft of native heart with angina pectoris 05/22/2016   Cath 2013 LAD 95% stenosis, D1 90% stenosis, RCA 20%, Circ 30%. Previous stent to D1 2012. EF 60%.   Degeneration of lumbar intervertebral disc 07/03/2023   Dizziness 10/27/2020   Excessive daytime sleepiness 09/11/2016   Gastroesophageal reflux disease 02/04/2023   Generalized anxiety disorder 05/22/2016   Herpes zoster 01/20/2020   History of small bowel obstruction    Admitted 10-14-2007 for partical small bowel obstruction and N.G. tube was placed. Admitted by Dr Gerard. 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   Other fatigue 06/13/2023   Paronychia of finger, left 08/22/2020   PLMD (periodic limb movement disorder) 09/11/2016   PUD (peptic ulcer disease)    Syncope 06/13/2023   TIA (transient ischemic attack)    Tinnitus of both ears 06/26/2022   Tremor 02/04/2023    Objective:  PHYSICAL EXAM:   BP 122/64   Pulse 69   Temp 98.1 F (36.7 C) (Temporal)   Resp 16   Ht 5' 10 (1.778 m)   Wt 179 lb 3.2 oz (81.3 kg)   SpO2 95%   BMI 25.71 kg/m    GEN: Well nourished, well developed, in no acute distress  Cardiac: RRR; no murmurs,  Respiratory:  normal respiratory rate and pattern with no distress - normal breath sounds with no rales, rhonchi, wheezes or rubs MS: no deformity or atrophy  Skin: warm and dry, no rash  Neuro:  Alert and Oriented x 3, - CN II-Xii grossly intact Psych: euthymic mood, appropriate affect and demeanor  EKG - no acute changes Assessment & Plan:    Coronary artery disease of native artery of native heart with stable angina pectoris (HCC) -     EKG 12-Lead Hold ASA/Plavix  according to cardiology recommendations Primary hypertension -     EKG 12-Lead Continue meds as directed Chronic bilateral low back pain with bilateral sciatica Follow up for surgery as planned Continue tramadol  as needed    Follow-up: Return for as scheduled for next chronic visit.  An After Visit Summary was printed and given to the patient.  CAMIE JONELLE NICHOLAUS DEVONNA [facility] [phone]

## 2024-06-04 ENCOUNTER — Other Ambulatory Visit (HOSPITAL_COMMUNITY): Payer: Self-pay

## 2024-06-05 ENCOUNTER — Other Ambulatory Visit (HOSPITAL_COMMUNITY): Payer: Self-pay

## 2024-06-05 ENCOUNTER — Other Ambulatory Visit: Payer: Self-pay

## 2024-06-05 MED ORDER — RANOLAZINE ER 1000 MG PO TB12
1000.0000 mg | ORAL_TABLET | Freq: Two times a day (BID) | ORAL | 3 refills | Status: AC
  Filled 2024-06-05 – 2024-06-07 (×2): qty 180, 90d supply, fill #0
  Filled 2024-06-08: qty 60, 30d supply, fill #0
  Filled 2024-07-07: qty 60, 30d supply, fill #1
  Filled 2024-08-03 – 2024-08-10 (×2): qty 60, 30d supply, fill #2
  Filled 2024-09-01 – 2024-09-07 (×2): qty 60, 30d supply, fill #3
  Filled 2024-10-01 – 2024-10-05 (×2): qty 60, 30d supply, fill #4
  Filled 2024-11-03: qty 60, 30d supply, fill #5

## 2024-06-07 ENCOUNTER — Other Ambulatory Visit: Payer: Self-pay | Admitting: Behavioral Health

## 2024-06-07 DIAGNOSIS — F411 Generalized anxiety disorder: Secondary | ICD-10-CM

## 2024-06-08 ENCOUNTER — Other Ambulatory Visit: Payer: Self-pay

## 2024-06-08 ENCOUNTER — Other Ambulatory Visit (HOSPITAL_COMMUNITY): Payer: Self-pay

## 2024-06-08 ENCOUNTER — Other Ambulatory Visit (HOSPITAL_BASED_OUTPATIENT_CLINIC_OR_DEPARTMENT_OTHER): Payer: Self-pay

## 2024-06-08 MED ORDER — LORAZEPAM 1 MG PO TABS
1.0000 mg | ORAL_TABLET | Freq: Three times a day (TID) | ORAL | 0 refills | Status: DC | PRN
  Filled 2024-06-08: qty 180, 60d supply, fill #0

## 2024-06-08 NOTE — Pre-Procedure Instructions (Signed)
 Surgical Instructions   Your procedure is scheduled on June 12, 2024. Report to [facility] Main Entrance A at 5:30 A.M., then check in with the Admitting office. Any questions or running late day of surgery: call [phone]  Questions prior to your surgery date: call [phone], Monday-Friday, 8am-4pm. If you experience any cold or flu symptoms such as cough, fever, chills, shortness of breath, etc. between now and your scheduled surgery, please notify us  at the above number.     Remember:  Do not eat or drink after midnight the night before your surgery    Take these medicines the morning of surgery with A SIP OF WATER : cariprazine  (VRAYLAR )  DULoxetine  (CYMBALTA )  ezetimibe  (ZETIA )  fexofenadine  (ALLEGRA )  isosorbide  mononitrate (IMDUR )  metoprolol  succinate (TOPROL -XL)  pantoprazole  (PROTONIX )  ranolazine  (RANEXA )  rosuvastatin  (CRESTOR )    May take these medicines IF NEEDED: acetaminophen  (TYLENOL ) cyclobenzaprine  (FLEXERIL )   LORazepam  (ATIVAN )  meclizine  (ANTIVERT )  nitroGLYCERIN  (NITROSTAT ) - if dose taken prior to surgery, please call [phone] ondansetron  (ZOFRAN )  traMADol  (ULTRAM )    Follow your surgeon's instructions on when to stop clopidogrel  (PLAVIX ) and Aspirin .  If no instructions were given by your surgeon then you will need to call the office to get those instructions.    STOP taking your Semaglutide -Weight Management (WEGOVY ) one week prior to surgery. DO NOT take any doses after August 21st.   One week prior to surgery, STOP taking any Aleve, Naproxen, Ibuprofen, Motrin, Advil, Goody's, BC's, all herbal medications, fish oil, and non-prescription vitamins.                     Do NOT Smoke (Tobacco/Vaping) for 24 hours prior to your procedure.  If you use a CPAP at night, you may bring your mask/headgear for your overnight stay.   You will be asked to remove any contacts, glasses, piercing's, hearing aid's, dentures/partials prior to  surgery. Please bring cases for these items if needed.    Patients discharged the day of surgery will not be allowed to drive home, and someone needs to stay with them for 24 hours.  SURGICAL WAITING ROOM VISITATION Patients may have no more than 2 support people in the waiting area - these visitors may rotate.   Pre-op nurse will coordinate an appropriate time for 1 ADULT support person, who may not rotate, to accompany patient in pre-op.  Children under the age of 57 must have an adult with them who is not the patient and must remain in the main waiting area with an adult.  If the patient needs to stay at the hospital during part of their recovery, the visitor guidelines for inpatient rooms apply.  Please refer to the [facility] website for the visitor guidelines for any additional information.   If you received a COVID test during your pre-op visit  it is requested that you wear a mask when out in public, stay away from anyone that may not be feeling well and notify your surgeon if you develop symptoms. If you have been in contact with anyone that has tested positive in the last 10 days please notify you surgeon.      Pre-operative 5 CHG Bathing Instructions   You can play a key role in reducing the risk of infection after surgery. Your skin needs to be as free of germs as possible. You can reduce the number of germs on your skin by washing with CHG (chlorhexidine  gluconate) soap before surgery. CHG is an antiseptic  soap that kills germs and continues to kill germs even after washing.   DO NOT use if you have an allergy to chlorhexidine /CHG or antibacterial soaps. If your skin becomes reddened or irritated, stop using the CHG and notify one of our RNs at [phone].   Please shower with the CHG soap starting 4 days before surgery using the following schedule:     Please keep in mind the following:  DO NOT shave, including legs and underarms, starting the day of your first shower.    You may shave your face at any point before/day of surgery.  Place clean sheets on your bed the day you start using CHG soap. Use a clean washcloth (not used since being washed) for each shower. DO NOT sleep with pets once you start using the CHG.   CHG Shower Instructions:  Wash your face and private area with normal soap. If you choose to wash your hair, wash first with your normal shampoo.  After you use shampoo/soap, rinse your hair and body thoroughly to remove shampoo/soap residue.  Turn the water  OFF and apply about 3 tablespoons (45 ml) of CHG soap to a CLEAN washcloth.  Apply CHG soap ONLY FROM YOUR NECK DOWN TO YOUR TOES (washing for 3-5 minutes)  DO NOT use CHG soap on face, private areas, open wounds, or sores.  Pay special attention to the area where your surgery is being performed.  If you are having back surgery, having someone wash your back for you may be helpful. Wait 2 minutes after CHG soap is applied, then you may rinse off the CHG soap.  Pat dry with a clean towel  Put on clean clothes/pajamas   If you choose to wear lotion, please use ONLY the CHG-compatible lotions that are listed below.  Additional instructions for the day of surgery: DO NOT APPLY any lotions, deodorants, cologne, or perfumes.   Do not bring valuables to the hospital. [facility] is not responsible for any belongings/valuables. Do not wear nail polish, gel polish, artificial nails, or any other type of covering on natural nails (fingers and toes) Do not wear jewelry or makeup Put on clean/comfortable clothes.  Please brush your teeth.  Ask your nurse before applying any prescription medications to the skin.     CHG Compatible Lotions   Aveeno Moisturizing lotion  Cetaphil Moisturizing Cream  Cetaphil Moisturizing Lotion  Clairol Herbal Essence Moisturizing Lotion, Dry Skin  Clairol Herbal Essence Moisturizing Lotion, Extra Dry Skin  Clairol Herbal Essence Moisturizing Lotion, Normal  Skin  Curel Age Defying Therapeutic Moisturizing Lotion with Alpha Hydroxy  Curel Extreme Care Body Lotion  Curel Soothing Hands Moisturizing Hand Lotion  Curel Therapeutic Moisturizing Cream, Fragrance-Free  Curel Therapeutic Moisturizing Lotion, Fragrance-Free  Curel Therapeutic Moisturizing Lotion, Original Formula  Eucerin Daily Replenishing Lotion  Eucerin Dry Skin Therapy Plus Alpha Hydroxy Crme  Eucerin Dry Skin Therapy Plus Alpha Hydroxy Lotion  Eucerin Original Crme  Eucerin Original Lotion  Eucerin Plus Crme Eucerin Plus Lotion  Eucerin TriLipid Replenishing Lotion  Keri Anti-Bacterial Hand Lotion  Keri Deep Conditioning Original Lotion Dry Skin Formula Softly Scented  Keri Deep Conditioning Original Lotion, Fragrance Free Sensitive Skin Formula  Keri Lotion Fast Absorbing Fragrance Free Sensitive Skin Formula  Keri Lotion Fast Absorbing Softly Scented Dry Skin Formula  Keri Original Lotion  Keri Skin Renewal Lotion Keri Silky Smooth Lotion  Keri Silky Smooth Sensitive Skin Lotion  Nivea Body Creamy Conditioning Oil  Nivea Body Extra Enriched Lotion  Nivea Body Original Lotion  Nivea Body Sheer Moisturizing Lotion Nivea Crme  Nivea Skin Firming Lotion  NutraDerm 30 Skin Lotion  NutraDerm Skin Lotion  NutraDerm Therapeutic Skin Cream  NutraDerm Therapeutic Skin Lotion  ProShield Protective Hand Cream  Provon moisturizing lotion  Please read over the following fact sheets that you were given.

## 2024-06-09 ENCOUNTER — Encounter (HOSPITAL_COMMUNITY)
Admission: RE | Admit: 2024-06-09 | Discharge: 2024-06-09 | Disposition: A | Discharge status: Home / Self Care | Source: Ambulatory Visit | Attending: Neurosurgery | Admitting: Neurosurgery

## 2024-06-09 ENCOUNTER — Other Ambulatory Visit: Payer: Self-pay

## 2024-06-09 ENCOUNTER — Encounter (HOSPITAL_COMMUNITY): Payer: Self-pay

## 2024-06-09 VITALS — BP 114/80 | HR 72 | Temp 97.7°F | Resp 17 | Ht 70.0 in | Wt 182.2 lb

## 2024-06-09 DIAGNOSIS — I10 Essential (primary) hypertension: Secondary | ICD-10-CM | POA: Diagnosis not present

## 2024-06-09 DIAGNOSIS — K219 Gastro-esophageal reflux disease without esophagitis: Secondary | ICD-10-CM | POA: Diagnosis not present

## 2024-06-09 DIAGNOSIS — G2119 Other drug induced secondary parkinsonism: Secondary | ICD-10-CM | POA: Diagnosis not present

## 2024-06-09 DIAGNOSIS — E7800 Pure hypercholesterolemia, unspecified: Secondary | ICD-10-CM | POA: Diagnosis not present

## 2024-06-09 DIAGNOSIS — I2510 Atherosclerotic heart disease of native coronary artery without angina pectoris: Secondary | ICD-10-CM | POA: Diagnosis not present

## 2024-06-09 DIAGNOSIS — F1090 Alcohol use, unspecified, uncomplicated: Secondary | ICD-10-CM | POA: Diagnosis not present

## 2024-06-09 DIAGNOSIS — Z955 Presence of coronary angioplasty implant and graft: Secondary | ICD-10-CM | POA: Diagnosis not present

## 2024-06-09 DIAGNOSIS — Z9889 Other specified postprocedural states: Secondary | ICD-10-CM | POA: Insufficient documentation

## 2024-06-09 DIAGNOSIS — F319 Bipolar disorder, unspecified: Secondary | ICD-10-CM | POA: Insufficient documentation

## 2024-06-09 DIAGNOSIS — Z01812 Encounter for preprocedural laboratory examination: Secondary | ICD-10-CM | POA: Diagnosis not present

## 2024-06-09 DIAGNOSIS — Z01818 Encounter for other preprocedural examination: Secondary | ICD-10-CM | POA: Diagnosis present

## 2024-06-09 DIAGNOSIS — Z8673 Personal history of transient ischemic attack (TIA), and cerebral infarction without residual deficits: Secondary | ICD-10-CM | POA: Diagnosis not present

## 2024-06-09 DIAGNOSIS — Z951 Presence of aortocoronary bypass graft: Secondary | ICD-10-CM | POA: Insufficient documentation

## 2024-06-09 DIAGNOSIS — M5416 Radiculopathy, lumbar region: Secondary | ICD-10-CM | POA: Insufficient documentation

## 2024-06-09 DIAGNOSIS — G4733 Obstructive sleep apnea (adult) (pediatric): Secondary | ICD-10-CM | POA: Diagnosis not present

## 2024-06-09 DIAGNOSIS — K56600 Partial intestinal obstruction, unspecified as to cause: Secondary | ICD-10-CM | POA: Diagnosis not present

## 2024-06-09 LAB — SURGICAL PCR SCREEN
MRSA, PCR: NEGATIVE
Staphylococcus aureus: NEGATIVE

## 2024-06-09 LAB — BASIC METABOLIC PANEL WITH GFR
Anion gap: 8 (ref 5–15)
BUN: 15 mg/dL (ref 8–23)
CO2: 26 mmol/L (ref 22–32)
Calcium: 8.6 mg/dL — ABNORMAL LOW (ref 8.9–10.3)
Chloride: 102 mmol/L (ref 98–111)
Creatinine, Ser: 1.04 mg/dL (ref 0.61–1.24)
GFR, Estimated: >60 mL/min (ref >60)
Glucose, Bld: 79 mg/dL (ref 70–99)
Potassium: 3.6 mmol/L (ref 3.5–5.1)
Sodium: 136 mmol/L (ref 135–145)

## 2024-06-09 LAB — TYPE AND SCREEN
ABO/RH(D): O POS
Antibody Screen: NEGATIVE

## 2024-06-09 LAB — CBC
HCT: 39.1 % (ref 39.0–52.0)
Hemoglobin: 13.2 g/dL (ref 13.0–17.0)
MCH: 33.6 pg (ref 26.0–34.0)
MCHC: 33.8 g/dL (ref 30.0–36.0)
MCV: 99.5 fL (ref 80.0–100.0)
Platelets: 145 K/uL — ABNORMAL LOW (ref 150–400)
RBC: 3.93 MIL/uL — ABNORMAL LOW (ref 4.22–5.81)
RDW: 14.1 % (ref 11.5–15.5)
WBC: 6.5 K/uL (ref 4.0–10.5)
nRBC: 0 % (ref 0.0–0.2)

## 2024-06-09 NOTE — Progress Notes (Addendum)
 PCP - Nicholaus Credit, PA-C  Cardiologist -  Bernie Lamar PARAS, MD (LOV 05-29-24)  PPM/ICD - denies Device Orders - n/a Rep Notified - n/a  Chest x-ray - 12-02-23 EKG - 06-03-24 Stress Test - 03-22-22 ECHO - 12-06-20 Cardiac Cath - 08-29-23  Sleep Study - 10-16-2016 CPAP - does not use  Dm -denies  Last dose of GLP1 agonist-  (WEGOVY )  GLP1 instructions: Last dose per patient 05-23-24  Blood Thinner Instructions:clopidogrel  (PLAVIX ) LAST DOSE 06-02-24 per patient Aspirin  Instructions: instructed patient to reach out to surgeon for instructions  ERAS Protcol -NPO  COVID TEST- N/a  Anesthesia review:  Yes clearance, Hx of Cad, Htn, OSA  Patient denies shortness of breath, fever, cough and chest pain at PAT appointment   All instructions explained to the patient, with a verbal understanding of the material. Patient agrees to go over the instructions while at home for a better understanding. Patient also instructed to self quarantine after being tested for COVID-19. The opportunity to ask questions was provided.

## 2024-06-10 ENCOUNTER — Ambulatory Visit: Admitting: Cardiology

## 2024-06-10 NOTE — Anesthesia Preprocedure Evaluation (Signed)
 Anesthesia Evaluation  Patient identified by MRN, date of birth, ID band Patient awake    Reviewed: Allergy & Precautions, NPO status , Patient's Chart, lab work & pertinent test results  Airway Mallampati: II  TM Distance: >3 FB Neck ROM: Full    Dental no notable dental hx.    Pulmonary    Pulmonary exam normal        Cardiovascular hypertension, Pt. on home beta blockers + angina  + CAD, + Cardiac Stents (x 6) and + CABG  Normal cardiovascular exam     Neuro/Psych  Headaches PSYCHIATRIC DISORDERS Anxiety Depression Bipolar Disorder   TIA   GI/Hepatic PUD,GERD  Medicated and Controlled,,(+)     substance abuse    Endo/Other  Patient on GLP-1 Agonist  Renal/GU Renal disease     Musculoskeletal negative musculoskeletal ROS (+)    Abdominal   Peds  Hematology  (+) Blood dyscrasia (Plavix )   Anesthesia Other Findings RADICULOPATHY, LUMBAR REGION  Reproductive/Obstetrics                              Anesthesia Physical Anesthesia Plan  ASA: 3  Anesthesia Plan: General   Post-op Pain Management:    Induction: Intravenous  PONV Risk Score and Plan: 2 and Dexamethasone , Ondansetron , Midazolam  and Treatment may vary due to age or medical condition  Airway Management Planned: Oral ETT  Additional Equipment: ClearSight  Intra-op Plan:   Post-operative Plan: Extubation in OR  Informed Consent: I have reviewed the patients History and Physical, chart, labs and discussed the procedure including the risks, benefits and alternatives for the proposed anesthesia with the patient or authorized representative who has indicated his/her understanding and acceptance.     Dental advisory given  Plan Discussed with: CRNA  Anesthesia Plan Comments: (PAT note written 06/10/2024 by Mosetta Ferdinand, PA-C. Potential arterial line placement discussed   )         Anesthesia Quick  Evaluation

## 2024-06-10 NOTE — Progress Notes (Signed)
 Anesthesia Chart Review:  Case: [id number] Date/Time: 06/12/24 0715   Procedures:      ANTERIOR LATERAL LUMBAR FUSION 1 LEVEL - Prone transpsoas interbody fusion, L2-3. Posterior percutaneous non-segmental instrumentation, L2-3     APPLICATION OF O-ARM   Anesthesia type: General   Diagnosis: Radiculopathy, lumbar region [M54.16]   Pre-op diagnosis: RADICULOPATHY, LUMBAR REGION   [facility]: [facility] / [facility]   Surgeons: Lanis Pupa, MD       DISCUSSION:  Patient is a 63 year old male scheduled for the above procedure. Surgery was initially scheduled for 02/04/24, but postponed while awaiting insurance approval.   History includes never smoker, HTN (and hypotension), hypercholesterolemia, CAD (CABG 2013: LIMA-LAD, SVG-D1, SVG-OM; prior mCX stent; DES D1 11/05/19; new CTO RCA with distal filling from left-to-right collaterals, no good PCI targets, continue medical therapy 08/29/23), TIA (2021), syncope, partial SBO (10/14/07, non-operative management), GERD, OSA (does not use CPAP), Bipolar 1 disorder, low testosterone , tremor/drug induced secondary parkinsonism, spinal surgery (Left L2-3 laminotomy/microdiscectomy 5/02/14/24). 14 standard alcohol beverages/week documented.    His wife is Dr. Abigail Free from [facility].   He had preoperative medical evaluation on 06/03/2024 by Nicholaus Credit, PA-C. He already had preoperative cardiac clearance, but had been evaluated in the ED on 05/30/2024 for dizziness/vertigo with double vision. He had similar symptoms with a prior Mnire's attack, but decided to present to ED after meclizine  did not help his symptoms. Head CT without acute findings. EKG did show RBBB (previously incomplete RBBB). Troponin, D-dimer, ethanol normal. CBC and CMP unremarkable with stable mild thrombocytopenia. Neurology was consisted. Brain MRI recommended and if negative, follow-up with ENT. Brain MRI showed no acute intracranial abnormality, and multifocal hyperintense  T2-weighted signal within the cerebral white matter likely from chronic small vessel disease. EKG repeated at PCP follow-up and showed normal QRS interval. She advised  follow-up with surgery as planned.     He has seen several cardiologists with [facility] in [place]. In review of cardiology notes dating back to June 2024, He had a stress test in June 2023 for chest pain evaluation. There was a small defect with mild reversibility consistent with ischemia, EF 72%. Nitroglycerin  use was infrequent, so decision made to maximize medical therapy. Troponins negative x 2. On  08/29/23, he had follow-up with Dr. Liborio for more frequent chest pain. He had also missed some doses of Ranexa . Decision made to pursue LHC which was done on 08/29/23 showing patent LIMA-LAD, patient vein graft to the DIAG with patent stent in the ostium of the DIAG branch, severe mid CX stenosis with patent vein graft to OM branch, large dominant RCA with CTO of the proximal vessel with distal vessel and branching filling from left-to-right collaterals. The RCA occlusion was new since 2021 but with brisk collateral filling and otherwise stable CAD. No targets for PCI, so continued medical management recommended. Advised to resume Ranexa . Last visit was on 05/29/2024 with Dr. Krasowski for preoperative exam. He wrote, Cardiovascular preop evaluation. Stable from cardiac point of view to proceed. He is able to do 4 METS no recent symptoms. Last cardiac catheterization reviewed, he is on dual antiplatelet therapy. This is more than 6 months after event, therefore we will be able to hold Plavix  before surgery would be advisable to keep aspirin  on board during the surgery but if surgeon thinks it is from surgical point review it is prohibitive should be fine to stop aspirin  for few days before surgery and restart both  medication as quickly as possible after that. 4 month follow-up planned.   Last Plavix  reported as 06/02/2024 and advised  to clairfy with surgeon perioperative ASA instructions.  He has pending ENT visit scheduled for 06/11/2024 with Tobie Comp, MD for dizziness/tinnitus.   A1c 5.2% on 03/03/2024. Last Wegovy  05/23/2024.  Anesthesia team to evaluate on the day of surgery.    VS: BP 114/80   Pulse 72   Temp 36.5 C   Resp 17   Ht 5' 10 (1.778 m)   Wt 82.6 kg   SpO2 98%   BMI 26.14 kg/m    PROVIDERS: Nicholaus Credit, PA-C is PCP  Bernie Charleston, MD is cardiologist [facility]) Onita Duos, MD and Dohmeier, Dedra, MD are his neurologists. Last out-patient visit 01/29/24 with Dr. Onita. He saw Dr. Chalice on 11/07/23 following ED visit for memory changes, recent ED visit for possible seizure. Had negative EEG and negative code stroke evaluation. Lithium  levels were normal. His cognitive changes felt probably related to medication and/ or depression,( pseudo dementia) , he has normal MRIs and Negative ATN testing. Spells not epileptiform and stroke work up was negative.SABRASABRAThinking of cataplexy/ narcolepsy. Consider Strattera . Follow-up in 4 months planned.   LABS: Labs reviewed: Acceptable for surgery. LFTs normal 05/30/2024. (all labs ordered are listed, but only abnormal results are displayed)  Labs Reviewed  BASIC METABOLIC PANEL WITH GFR - Abnormal; Notable for the following components:      Result Value   Calcium  8.6 (*)    All other components within normal limits  CBC - Abnormal; Notable for the following components:   RBC 3.93 (*)    Platelets 145 (*)    All other components within normal limits  SURGICAL PCR SCREEN  TYPE AND SCREEN     OTHER: EEG 10/07/23: IMPRESSION: This study is within normal limits. No seizures or epileptiform discharges were seen throughout the recording. A normal interictal EEG does not exclude the diagnosis of epilepsy.     IMAGES: MRI Brain 05/30/2024: IMPRESSION: 1. No acute intracranial abnormality. 2. Multifocal hyperintense T2-weighted  signal within the cerebral white matter, most commonly due to chronic small vessel disease.  CT Head 05/30/2024: IMPRESSION: No evidence of acute intracranial abnormality. ASPECTS of 10.  MRI L-spine 05/01/2024: IMPRESSION: - Interval postsurgical changes of left hemilaminotomy at L2-3. Left subarticular disc protrusion at L2-3 is slightly decreased in size. Residual/recurrent disc protrusion at this level results in lateral recess narrowing with impingement upon the traversing left L3 nerve roots. - Interval worsening of degenerative endplate changes, discogenic edema, and posterior osteophytes along the left aspect of L2-3 with interval disc height loss on the left. Severe foraminal stenosis on the left at L2-3, increased from prior. - Additional degenerative changes as above.   MRA Neck 01/24/24: In process. 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  CXR 12/02/23: FINDINGS: The heart size and mediastinal contours are within normal limits. Prior CABG again noted. Both lungs are clear. The visualized skeletal structures are unremarkable. IMPRESSION: No active cardiopulmonary disease.      EKG:  EKG 06/03/2024: Sinus rhythm. Possible left atrial abnormality. rSr'(V1), probable normal variant.   EKG 05/30/2024: Sinus rhythm Left atrial enlargement RBBB and LAFB Confirmed by Jerral Meth [phone]) on 05/31/2024 5:53:31 PM  EKG 05/29/2024 ([facility]): Normal sinus rhythm Possible Left atrial enlargement Prolonged QT (QT 414 ms, QTcB 480 ms) Abnormal ECG When compared with ECG of 03-Oct-2023 13:58, PREVIOUS  ECG IS PRESENT Confirmed by Bernie Charleston [phone]) on  05/29/2024 4:52:28 PM  EKG 10/03/2023: Sinus rhythm Prominent P waves, nondiagnostic Incomplete right bundle branch block Confirmed by Nicholaus Dolphin [phone]) on 10/03/2023 2:06:00 PM       CV: Cardiac cath 08/29/2023:   Lida LAD to  Prox LAD lesion is 80% stenosed.   Mid LAD lesion is 80% stenosed.   1st Diag lesion is 90% stenosed.   Prox RCA to Mid RCA lesion is 100% stenosed.   Prox Cx lesion is 80% stenosed.   Previously placed Origin to Prox Graft stent of unknown type is  widely patent.   Non-stenotic Mid Cx lesion was previously treated.   LIMA graft was visualized by angiography.   SVG graft was visualized by angiography.   SVG graft was visualized by angiography.   The left ventricular systolic function is normal.   LV end diastolic pressure is normal.   The left ventricular ejection fraction is 50-55% by visual estimate.   Severe stenosis proximal and mid LAD. Patent LIMA graft to the mid LAD. Severe stenosis in the ostium of the small to moderate caliber Diagonal branch. Patent vein graft to the Diagonal branch with patent stent in the ostium of the Diagonal branch. The stent appears to extend into the aorta.  Severe stenosis in the mid Circumflex artery. Patent vein graft to the obtuse marginal branch.  Large dominant RCA with chronic occlusion of the proximal vessel. The distal vessel and branches fill from left to right collaterals.  Normal LV systolic function. LVEDP 11 mmHg.    Recommendations: The RCA occlusion is new since his cardiac cath in 2021. This vessel has brisk collateral filling. His CAD is otherwise stable. No targets for PCI. Continue medical management of CAD. He will resume his Ranexa  as his angina started when he stopped his Ranexa  this week. Discharge home tonight after bedrest.      Nuclear stress test 03/22/2022:   Findings are consistent with ischemia. The study is low risk.   LV perfusion is abnormal. Defect 1: There is a small defect with mild reduction in uptake present in the inferior location(s) that is reversible. There is normal wall motion in the defect area. Consistent with ischemia.   Left ventricular function is normal. Nuclear stress EF: 72 %. The left ventricular ejection  fraction is hyperdynamic (>65%). End diastolic cavity size is normal.   Prior study not available for comparison.     12 day Long Term Monitor 11/09/2020:  Indication: Dizziness - The minimum heart rate was 48 bpm, maximum heart rate was 108 bpm, and average heart rate was 68 bpm. Predominant underlying rhythm was Sinus Rhythm. - Premature atrial complexes were rare less than 1%. Premature Ventricular complexes were rare less than 1%. - No no ventricular tachycardia, no pauses, No AV block, no supraventricular tachycardia and no atrial fibrillation present. No patient triggered events or diary events reported.  - Conclusion: Normal/unremarkable study     Echo 12/06/2020: IMPRESSIONS   1. GLS: -16.5. Left ventricular ejection fraction, by estimation, is 60  to 65%. The left ventricle has normal function. The left ventricle has no  regional wall motion abnormalities. Left ventricular diastolic parameters  were normal.   2. Right ventricular systolic function is normal. The right ventricular  size is normal. There is normal pulmonary artery systolic pressure.   3. The mitral valve is normal in structure. Mild mitral valve  regurgitation. No evidence of mitral stenosis.   4.  The aortic valve is normal in structure. Aortic valve regurgitation is  not visualized. No aortic stenosis is present.   5. The inferior vena cava is normal in size with greater than 50%  respiratory variability, suggesting right atrial pressure of 3 mmHg.     Past Medical History:  Diagnosis Date   Abnormal EKG 06/13/2023   Abnormal stress test small area of mild ischemia inferior wall 05/24/2022   Aphasia 06/13/2023   Arthropathy of lumbar facet joint 07/03/2023   Bilateral sacroiliitis (HCC) 08/29/2023   Bipolar 1 disorder 02/04/2023   Chronic bilateral low back pain with right-sided sciatica 06/24/2023   Chronic headaches 01/30/2021   Class 2 obesity due to excess calories without serious comorbidity in  adult 09/11/2016   Confusional arousals 09/11/2016   Coronary artery disease involving coronary bypass graft of native heart with angina pectoris 05/22/2016   Cath 2013 LAD 95% stenosis, D1 90% stenosis, RCA 20%, Circ 30%. Previous stent to D1 2012. EF 60%.   Degeneration of lumbar intervertebral disc 07/03/2023   Dizziness 10/27/2020   Excessive daytime sleepiness 09/11/2016   Gastroesophageal reflux disease 02/04/2023   Generalized anxiety disorder 05/22/2016   Herpes zoster 01/20/2020   History of small bowel obstruction    Admitted 10-14-2007 for partical small bowel obstruction and N.G. tube was placed. Admitted by Dr Gerard. 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 Small internal hemorrhoids. Otherwise normal colonoscopy to terminal ileum   CORONARY ARTERY BYPASS GRAFT  02/19/2012   L - LAD, SVG - D1, SVG - OM   CORONARY STENT INTERVENTION N/A 11/05/2019   Procedure: CORONARY STENT INTERVENTION;  Surgeon: Court Dorn PARAS, MD;   [facility]: [facility] INV[facility]IVE CV [facility];  Service: Cardiovascular;  Laterality: N/A;  SVG to DIAG   ESOPHAGOGASTRODUODENOSCOPY  01/08/2007   Multiple duodenal ulcers with stenosis/stricture of the distal first portion of the duodenum (measuring approximantely 9mm) Mild gastritis.   LEFT HEART CATH AND CORS/GRAFTS ANGIOGRAPHY N/A 11/05/2019   Procedure: LEFT HEART CATH AND CORS/GRAFTS ANGIOGRAPHY;  Surgeon: Court Dorn PARAS, MD;  [facility]: [facility];  Service: Cardiovascular;  Laterality: N/A;   LEFT HEART CATH AND CORS/GRAFTS ANGIOGRAPHY N/A 08/29/2023   Procedure: LEFT HEART CATH AND CORS/GRAFTS ANGIOGRAPHY;  Surgeon: Verlin Lonni BIRCH, MD;  Location: [facility];  Service: Cardiovascular;  Laterality: N/A;   LUMBAR LAMINECTOMY/DECOMPRESSION MICRODISCECTOMY Left 02/14/2024   Procedure: LUMBAR LAMINECTOMY/DECOMPRESSION MICRODISCECTOMY LEFT LUMBAR TWO-LUMBAR THREE;  Surgeon: Lanis Pupa, MD;  Location: [facility] OR;  Service: Neurosurgery;  Laterality: Left;  MICRODISCECTOMY, LT, L23  RENAL ANGIOGRAPHY N/A 11/05/2019   Procedure: RENAL ANGIOGRAPHY;  Surgeon: Court Dorn PARAS, MD;  Location: [facility] INVASIVE CV LAB;  Service: Cardiovascular;  Laterality: N/A;   TONSILLECTOMY AND ADENOIDECTOMY     WRIST SURGERY Right     MEDICATIONS:  acetaminophen  (TYLENOL ) 500 MG tablet   aspirin  EC (ASPIRIN  LOW DOSE) 81 MG tablet   cariprazine  (VRAYLAR ) 3 MG capsule   clopidogrel  (PLAVIX ) 75 MG tablet   cyclobenzaprine  (FLEXERIL ) 10 MG tablet   DULoxetine  (CYMBALTA ) 60 MG capsule   ezetimibe  (ZETIA ) 10 MG tablet   fexofenadine  (ALLEGRA ) 180 MG tablet   fexofenadine  (ALLEGRA ) 60 MG tablet   gabapentin  (NEURONTIN ) 300 MG capsule   hydrochlorothiazide  (HYDRODIURIL ) 12.5 MG tablet   isosorbide  mononitrate (IMDUR ) 60 MG 24 hr tablet   LORazepam  (ATIVAN ) 1 MG tablet   meclizine  (ANTIVERT ) 25 MG tablet   metoprolol  succinate (TOPROL -XL) 50 MG 24 hr tablet   Multiple Vitamin (MULTIVITAMIN WITH  MINERALS) TABS tablet   nitroGLYCERIN  (NITROSTAT ) 0.4 MG SL tablet   ondansetron  (ZOFRAN ) 4 MG tablet   pantoprazole  (PROTONIX ) 40 MG tablet   ranolazine  (RANEXA ) 1000 MG SR tablet   rosuvastatin  (CRESTOR ) 40 MG tablet   Semaglutide -Weight Management (WEGOVY ) 2.4 MG/0.75ML SOAJ   Solriamfetol  HCl (SUNOSI ) 150 MG TABS   No current facility-administered medications for this encounter.    Isaiah Ruder, PA-C Surgical Short Stay/Anesthesiology [facility] Phone [phone] [facility] Phone [phone] 06/10/2024 2:36 PM

## 2024-06-11 ENCOUNTER — Encounter (INDEPENDENT_AMBULATORY_CARE_PROVIDER_SITE_OTHER): Payer: Self-pay | Admitting: Otolaryngology

## 2024-06-11 ENCOUNTER — Ambulatory Visit (INDEPENDENT_AMBULATORY_CARE_PROVIDER_SITE_OTHER): Admitting: Otolaryngology

## 2024-06-11 VITALS — BP 116/78 | HR 69 | Ht 70.0 in | Wt 180.0 lb

## 2024-06-11 DIAGNOSIS — R42 Dizziness and giddiness: Secondary | ICD-10-CM | POA: Diagnosis not present

## 2024-06-11 NOTE — Patient Instructions (Addendum)
 Betahistine (8mg  tablet - twice a day) [email or web address]   Vestibular testing number: [phone]

## 2024-06-11 NOTE — Progress Notes (Signed)
 Dear Dr. Nicholaus, Here is my assessment for our mutual patient, Jon Gonzales. Thank you for allowing me the opportunity to care for your patient. Please do not hesitate to contact me should you have any other questions. Sincerely, Dr. Eldora Blanch  Otolaryngology Clinic Note Referring provider: Dr. Nicholaus HPI:  Jon Gonzales is a 63 y.o. male kindly referred by Dr. Nicholaus for evaluation of vertigo  Initial visit (05/2024): Patient reports: episodic vertigo which has been ongoing for at least 2-3 years. Now having almost weekly or twice weekly attacks where each attack with last up to 6 hours, with symptoms including worsening ear fullness and tinnitus and nausea. Interestingly, he denies any hearing change (if anything hearing is more acute(?) during these episodes and sometimes does have other symptoms as well including some light sensitivity, bodily weakness, diplopia, and some tongue slurring as well. Wife has also noted some confusion/forgetfulness after these episodes as well. No facial weakness or numbness. Has intermittent headaches with this but not a primary features. No triggers or association with food. This is quite disabling. He reports that he has tried meclizine  and it helps somewhat. He has also tried nurtec recently and this may have helped(?) - no attacks over last week. He otherwise denies significant otologic sx.  Patient denies: ear pain, fullness, vertigo, drainage Patient additionally denies: deep pain in ear canal, eustachian tube symptoms such as popping, crackling, sensitive to pressure changes Patient also denies barotrauma, ototoxic medication use Prior ear surgery: no  He has had Neurology eval, multiple rounds of imaging - without definitive pathology or diagnosis. Most recent ED visit 05/2024.  He is on low dose hydrochlorothiazide  for query presumed Meniere's - cannot handle higher dose due to hypotension  Personal or FHx of bleeding dz or anesthesia difficulty: no    Tobacco: no  PMHx: HTN, CAD, OSA, BP 1  Independent Review of Additional Tests or Records:  ED visit 05/30/2024 Dr. 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, unchanged  Other CT and MR Brain/Head reviewed 10/03/2023: again no retrocochlear lesions appreciated, mastoid/ME/otic capsule without pathology noted  No recent audiogram noted; [facility] 06/26/2022 notes reviewed Neuro and PCP notes reviewed as well   PMH/Meds/All/SocHx/FamHx/ROS:   Past Medical History:  Diagnosis Date   Abnormal EKG 06/13/2023   Abnormal stress test small area of mild ischemia inferior wall 05/24/2022   Aphasia 06/13/2023   Arthropathy of lumbar facet joint 07/03/2023   Bilateral sacroiliitis (HCC) 08/29/2023   Bipolar 1 disorder 02/04/2023   Chronic bilateral low back pain with right-sided sciatica 06/24/2023   Chronic headaches 01/30/2021   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   Coronary artery disease involving coronary bypass graft of native heart with angina pectoris 05/22/2016   Cath 2013 LAD 95% stenosis, D1 90% stenosis, RCA 20%, Circ 30%. Previous stent to D1 2012. EF 60%.   Degeneration of lumbar intervertebral disc 07/03/2023   Dizziness 10/27/2020   Excessive daytime sleepiness 09/11/2016   Gastroesophageal reflux disease 02/04/2023   Generalized anxiety disorder 05/22/2016   Herpes zoster 01/20/2020   History of small bowel obstruction    Admitted 10-14-2007 for partical small bowel obstruction and N.G.  tube was placed. Admitted by Dr Gerard. 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 Small internal hemorrhoids. Otherwise normal colonoscopy to terminal ileum   CORONARY ARTERY BYPASS GRAFT  02/19/2012   L - LAD, SVG - D1, SVG - OM   CORONARY STENT INTERVENTION N/A 11/05/2019   Procedure: CORONARY STENT INTERVENTION;  Surgeon: Court Dorn PARAS, MD;  Location: [facility];  Service: Cardiovascular;  Laterality: N/A;  SVG to DIAG   ESOPHAGOGASTRODUODENOSCOPY  01/08/2007   Multiple duodenal ulcers with stenosis/stricture of the distal first portion of the duodenum (measuring approximantely 9mm) Mild gastritis.   LEFT HEART CATH AND CORS/GRAFTS ANGIOGRAPHY N/A 11/05/2019   Procedure: LEFT HEART CATH AND CORS/GRAFTS ANGIOGRAPHY;  Surgeon: Court Dorn PARAS, MD;  Location: [facility];  Service: Cardiovascular;  Laterality: N/A;   LEFT HEART CATH AND CORS/GRAFTS ANGIOGRAPHY N/A 08/29/2023   Procedure: LEFT HEART CATH AND CORS/GRAFTS ANGIOGRAPHY;  Surgeon: Verlin Lonni BIRCH, MD;  Location:  [facility];  Service: Cardiovascular;  Laterality: N/A;   LUMBAR LAMINECTOMY/DECOMPRESSION MICRODISCECTOMY Left 02/14/2024   Procedure: LUMBAR LAMINECTOMY/DECOMPRESSION MICRODISCECTOMY LEFT LUMBAR TWO-LUMBAR THREE;  Surgeon: Lanis Pupa, MD;  Location: [facility];  Service: Neurosurgery;  Laterality: Left;  MICRODISCECTOMY, LT, L23   RENAL ANGIOGRAPHY N/A 11/05/2019   Procedure: RENAL ANGIOGRAPHY;  Surgeon: Court Dorn PARAS, MD;  Location: [facility];  Service: Cardiovascular;  Laterality: N/A;   TONSILLECTOMY AND ADENOIDECTOMY     WRIST SURGERY Right     Family History  Problem Relation Age of Onset   Aneurysm Mother 20       cerebral   Early death Mother    CAD Father 3   Early death Father    Heart disease Sister        Unclear   Healthy Brother    Dementia Other        several individuals on extended paternal side of family     Social Connections: Moderately Integrated (06/04/2022)   Social Connection and Isolation Panel    Frequency of Communication with Friends and Family: More than three times a week    Frequency of Social Gatherings with Friends and Family: More than three times a week    Attends Religious Services: Never    Database administrator or Organizations: Yes    Attends Engineer, structural: More than 4 times per year    Marital Status: Married      Current Outpatient Medications:    acetaminophen  (TYLENOL ) 500 MG tablet, Take 500-1,000  mg by mouth every 6 (six) hours as needed for moderate pain (pain score 4-6)., Disp: , Rfl:    aspirin  EC (ASPIRIN  LOW DOSE) 81 MG tablet, Take 1 tablet (81 mg total) by mouth daily., Disp: 90 tablet, Rfl: 0   cariprazine  (VRAYLAR ) 3 MG capsule, Take 1 capsule (3 mg total) by mouth daily., Disp: 90 capsule, Rfl: 0   [Paused] clopidogrel  (PLAVIX ) 75 MG tablet, Take 1 tablet (75 mg total) by mouth daily., Disp: 90 tablet, Rfl: 1   DULoxetine  (CYMBALTA ) 60 MG capsule, Take 1 capsule (60 mg total) by  mouth 2 (two) times daily., Disp: 180 capsule, Rfl: 1   ezetimibe  (ZETIA ) 10 MG tablet, Take 1 tablet (10 mg total) by mouth daily., Disp: 90 tablet, Rfl: 1   fexofenadine  (ALLEGRA ) 180 MG tablet, Take 180 mg by mouth daily., Disp: , Rfl:    gabapentin  (NEURONTIN ) 300 MG capsule, Take 1 capsule (300 mg total) by mouth every evening., Disp: 30 capsule, Rfl: 3   hydrochlorothiazide  (HYDRODIURIL ) 12.5 MG tablet, Take 1 tablet (12.5 mg total) by mouth daily., Disp: 30 tablet, Rfl: 11   isosorbide  mononitrate (IMDUR ) 60 MG 24 hr tablet, Take 2 tablets (120 mg total) by mouth daily., Disp: 180 tablet, Rfl: 1   LORazepam  (ATIVAN ) 1 MG tablet, Take 1 tablet (1 mg total) by mouth every 8 (eight) hours as needed for anxiety., Disp: 180 tablet, Rfl: 0   meclizine  (ANTIVERT ) 25 MG tablet, Take 1 tablet (25 mg total) by mouth 3 (three) times daily as needed for dizziness., Disp: 90 tablet, Rfl: 2   metoprolol  succinate (TOPROL -XL) 50 MG 24 hr tablet, Take 1 tablet by mouth daily. Take with or immediately following a meal., Disp: 90 tablet, Rfl: 1   Multiple Vitamin (MULTIVITAMIN WITH MINERALS) TABS tablet, Take 1 tablet by mouth daily. Men's Multivitamin, Disp: , Rfl:    nitroGLYCERIN  (NITROSTAT ) 0.4 MG SL tablet, Dissolve 1 tablet (0.4 mg total) under the tongue every 5 (five) minutes up to 3 doses as needed for chest pain. If no relief after 3 doses call 911., Disp: 25 tablet, Rfl: 1   ondansetron  (ZOFRAN ) 4 MG tablet, Take 1 tablet (4 mg total) by mouth every 8 (eight) hours as needed for nausea or vomiting., Disp: 20 tablet, Rfl: 0   pantoprazole  (PROTONIX ) 40 MG tablet, Take 1 tablet (40 mg total) by mouth daily., Disp: 90 tablet, Rfl: 1   ranolazine  (RANEXA ) 1000 MG SR tablet, Take 1 tablet by mouth 2 times daily., Disp: 180 tablet, Rfl: 3   rosuvastatin  (CRESTOR ) 40 MG tablet, Take 1 tablet (40 mg total) by mouth daily., Disp: 90 tablet, Rfl: 1   Semaglutide -Weight Management (WEGOVY ) 2.4 MG/0.75ML SOAJ,  Inject 2.4 mg into the skin once a week., Disp: 3 mL, Rfl: 1   Solriamfetol  HCl (SUNOSI ) 150 MG TABS, Take 1 tablet (150 mg total) by mouth every morning., Disp: 90 tablet, Rfl: 1   fexofenadine  (ALLEGRA ) 60 MG tablet, Take 1 tablet (60 mg total) by mouth daily. (Patient not taking: Reported on 06/11/2024), Disp: 90 tablet, Rfl: 2   methocarbamol  (ROBAXIN ) 500 MG tablet, Take 1 tablet (500 mg total) by mouth every 6 (six) hours as needed for muscle spasms., Disp: 30 tablet, Rfl: 1   oxyCODONE -acetaminophen  (PERCOCET) 5-325 MG tablet, Take 1-2 tablets by mouth every 4 (four) hours as needed., Disp: 48 tablet, Rfl: 0 No current facility-administered medications for this visit.  Facility-Administered Medications Ordered in Other Visits:    0.9 %  sodium chloride  infusion, , Intravenous, Continuous, Lanis Pupa, MD, Stopped at 06/13/24 1211   acetaminophen  (TYLENOL ) tablet 650 mg, 650 mg, Oral, Q4H PRN **OR** acetaminophen  (TYLENOL ) suppository 650 mg, 650 mg, Rectal, Q4H PRN, Lanis Pupa, MD   bisacodyl  (DULCOLAX) suppository 10 mg, 10 mg, Rectal, Daily PRN, Lanis Pupa, MD   cariprazine  (VRAYLAR ) capsule 3 mg, 3 mg, Oral, Daily, Nundkumar, Neelesh, MD, 3 mg at 06/13/24 1024   docusate sodium  (COLACE) capsule 100 mg, 100 mg, Oral, BID, Nundkumar, Neelesh, MD, 100 mg at 06/13/24 1004   DULoxetine  (CYMBALTA ) DR capsule 60 mg, 60 mg, Oral, BID, Nundkumar, Neelesh, MD, 60 mg at 06/13/24 1025   ezetimibe  (ZETIA ) tablet 10 mg, 10 mg, Oral, Daily, Nundkumar, Neelesh, MD, 10 mg at 06/13/24 1025   gabapentin  (NEURONTIN ) capsule 300 mg, 300 mg, Oral, QPM, Lanis Pupa, MD, 300 mg at 06/12/24 8161   hydrochlorothiazide  (HYDRODIURIL ) tablet 12.5 mg, 12.5 mg, Oral, Daily, Nundkumar, Neelesh, MD, 12.5 mg at 06/13/24 1025   isosorbide  mononitrate (IMDUR ) 24 hr tablet 120 mg, 120 mg, Oral, Daily, Nundkumar, Neelesh, MD, 120 mg at 06/13/24 1025   loratadine  (CLARITIN ) tablet 10 mg, 10 mg,  Oral, Daily, Lanis Pupa, MD   LORazepam  (ATIVAN ) tablet 1 mg, 1 mg, Oral, Q8H PRN, Nundkumar, Neelesh, MD, 1 mg at 06/12/24 2101   meclizine  (ANTIVERT ) tablet 25 mg, 25 mg, Oral, TID PRN, Nundkumar, Neelesh, MD   menthol -cetylpyridinium (CEPACOL) lozenge 3 mg, 1 lozenge, Oral, PRN **OR** phenol (CHLORASEPTIC) mouth spray 1 spray, 1 spray, Mouth/Throat, PRN, Lanis Pupa, MD   methocarbamol  (ROBAXIN ) tablet 500 mg, 500 mg, Oral, Q6H PRN, 500 mg at 06/13/24 0500 **OR** methocarbamol  (ROBAXIN ) injection 500 mg, 500 mg, Intravenous, Q6H PRN, Nundkumar, Neelesh, MD   metoprolol  succinate (TOPROL -XL) 24 hr tablet 50 mg, 50 mg, Oral, Daily, Nundkumar, Neelesh, MD, 50 mg at 06/13/24 1025   morphine  (PF) 2 MG/ML injection 2 mg, 2 mg, Intravenous, Q2H PRN, Nundkumar, Neelesh, MD   multivitamin with minerals tablet 1 tablet, 1 tablet, Oral, Daily, Nundkumar, Neelesh, MD, 1 tablet at 06/13/24 1025   nitroGLYCERIN  (NITROSTAT ) SL tablet 0.4 mg, 0.4 mg, Sublingual, Q5 Min x 3 PRN, Nundkumar, Neelesh, MD   ondansetron  (ZOFRAN ) tablet 4 mg, 4 mg, Oral, Q6H PRN **OR** ondansetron  (ZOFRAN ) injection 4 mg, 4 mg, Intravenous, Q6H PRN, Nundkumar, Neelesh, MD   oxyCODONE  (Oxy IR/ROXICODONE ) immediate release tablet 10 mg, 10 mg, Oral, Q3H PRN, Nundkumar, Neelesh, MD, 10 mg at 06/13/24 1004   oxyCODONE  (Oxy IR/ROXICODONE ) immediate release tablet 5 mg, 5 mg, Oral, Q3H PRN, Nundkumar, Neelesh, MD   pantoprazole  (PROTONIX ) EC tablet 40 mg, 40 mg, Oral, Daily, Nundkumar, Neelesh, MD, 40 mg at 06/13/24 1004   ranolazine  (RANEXA ) 12 hr tablet 1,000 mg, 1,000 mg, Oral, BID, Nundkumar, Neelesh, MD, 1,000 mg at 06/13/24 1025   rosuvastatin  (CRESTOR ) tablet 40 mg, 40 mg, Oral, Daily, Nundkumar, Neelesh, MD, 40 mg at 06/13/24 1024   senna (SENOKOT) tablet 8.6 mg, 1 tablet, Oral, BID, Nundkumar, Neelesh, MD, 8.6 mg at 06/13/24 1004   sodium chloride  flush (NS) 0.9 % injection 3 mL, 3 mL, Intravenous, Q12H, Nundkumar,  Pupa, MD, 3 mL at 06/13/24 1007   sodium chloride  flush (NS) 0.9 % injection 3 mL, 3 mL, Intravenous, PRN, Nundkumar, Neelesh, MD   Solriamfetol  HCl TABS 150 mg, 150 mg, Oral, Landrum Lanis Pupa, MD   Physical Exam:   BP 116/78 (BP Location: Right Arm, Patient Position: Sitting, Cuff Size: Normal)   Pulse 69  Ht 5' 10 (1.778 m)   Wt 180 lb (81.6 kg)   SpO2 94%   BMI 25.83 kg/m   Salient findings:  CN II-XII intact Bilateral EAC clear and TM intact with well pneumatized middle ear spaces Anterior rhinoscopy: Septum intact; bilateral inferior turbinates without significant hypertrophy No lesions of oral cavity/oropharynx No obviously palpable neck masses/lymphadenopathy/thyromegaly No respiratory distress or stridor Gait normal, no nystagmus, DH negative, negative head shake  Seprately Identifiable Procedures:  Prior to initiating any procedures, risks/benefits/alternatives were explained to the patient and verbal consent obtained. None  Impression & Plans:  Morse Brueggemann is a 63 y.o. male with:  1. Vertigo    Has had multiple rounds of imaging and is on low dose hydrochlorothiazide  with persistent sx. Meclizine  helps some but given some of his other symptoms (weakness, slurring etc.), I wonder if there is a migraine component as well.  Tried nurtec last week and it helped.  Clearly given he is having attacks weekly, he needs further workup and I do think formal vestibular testing will be helpful. He is in agreement In interim, continue hydrochlorothiazide  and meclizine  (PRN) as well Will get baseline audio as well; if having attack, encouraged to call to get audio.  - f/u 6-8 weeks, sooner as needed  See below regarding exact medications prescribed this encounter including dosages and route: No orders of the defined types were placed in this encounter.     Thank you for allowing me the opportunity to care for your patient. Please do not hesitate to contact me  should you have any other questions.  Sincerely, Eldora Blanch, MD Otolaryngologist (ENT), [facility] Phone: [phone] Fax: [phone]  06/13/2024, 4:34 PM   I have personally spent 60 minutes involved in face-to-face and non-face-to-face activities for this patient on the day of the visit.  Professional time spent excludes any procedures performed but includes the following activities, in addition to those noted in the documentation: preparing to see the patient (review of outside documentation and results - extensive including multiple neuro notes, cardiology note, PCP notes), performing a medically appropriate examination, counseling, documenting in the electronic health record, independently interpreting results (multiple CT and MRI).

## 2024-06-12 ENCOUNTER — Encounter (HOSPITAL_COMMUNITY)
Admission: RE | Disposition: A | Discharge status: Home / Self Care | Payer: Self-pay | Source: Home / Self Care | Attending: Neurosurgery

## 2024-06-12 ENCOUNTER — Ambulatory Visit (HOSPITAL_COMMUNITY): Payer: Self-pay | Admitting: Vascular Surgery

## 2024-06-12 ENCOUNTER — Other Ambulatory Visit: Payer: Self-pay

## 2024-06-12 ENCOUNTER — Observation Stay (HOSPITAL_COMMUNITY)
Admission: RE | Admit: 2024-06-12 | Discharge: 2024-06-13 | Disposition: A | Discharge status: Home / Self Care | Attending: Neurosurgery | Admitting: Neurosurgery

## 2024-06-12 ENCOUNTER — Encounter (HOSPITAL_COMMUNITY): Payer: Self-pay | Admitting: Neurosurgery

## 2024-06-12 ENCOUNTER — Ambulatory Visit (HOSPITAL_COMMUNITY)

## 2024-06-12 ENCOUNTER — Ambulatory Visit (HOSPITAL_COMMUNITY): Admitting: Anesthesiology

## 2024-06-12 DIAGNOSIS — M4306 Spondylolysis, lumbar region: Principal | ICD-10-CM | POA: Diagnosis present

## 2024-06-12 DIAGNOSIS — F1090 Alcohol use, unspecified, uncomplicated: Secondary | ICD-10-CM | POA: Insufficient documentation

## 2024-06-12 DIAGNOSIS — I25119 Atherosclerotic heart disease of native coronary artery with unspecified angina pectoris: Secondary | ICD-10-CM | POA: Diagnosis not present

## 2024-06-12 DIAGNOSIS — I10 Essential (primary) hypertension: Secondary | ICD-10-CM

## 2024-06-12 DIAGNOSIS — E66812 Obesity, class 2: Secondary | ICD-10-CM | POA: Insufficient documentation

## 2024-06-12 DIAGNOSIS — I2510 Atherosclerotic heart disease of native coronary artery without angina pectoris: Secondary | ICD-10-CM | POA: Diagnosis not present

## 2024-06-12 DIAGNOSIS — Z7982 Long term (current) use of aspirin: Secondary | ICD-10-CM | POA: Insufficient documentation

## 2024-06-12 DIAGNOSIS — G4733 Obstructive sleep apnea (adult) (pediatric): Secondary | ICD-10-CM | POA: Diagnosis not present

## 2024-06-12 DIAGNOSIS — I25110 Atherosclerotic heart disease of native coronary artery with unstable angina pectoris: Secondary | ICD-10-CM | POA: Diagnosis not present

## 2024-06-12 DIAGNOSIS — M4726 Other spondylosis with radiculopathy, lumbar region: Principal | ICD-10-CM | POA: Insufficient documentation

## 2024-06-12 DIAGNOSIS — M47896 Other spondylosis, lumbar region: Secondary | ICD-10-CM | POA: Diagnosis present

## 2024-06-12 DIAGNOSIS — M5416 Radiculopathy, lumbar region: Secondary | ICD-10-CM

## 2024-06-12 DIAGNOSIS — M48061 Spinal stenosis, lumbar region without neurogenic claudication: Secondary | ICD-10-CM | POA: Insufficient documentation

## 2024-06-12 DIAGNOSIS — Z981 Arthrodesis status: Secondary | ICD-10-CM | POA: Diagnosis not present

## 2024-06-12 HISTORY — PX: ANTERIOR LAT LUMBAR FUSION: SHX1168

## 2024-06-12 LAB — GLUCOSE, CAPILLARY: Glucose-Capillary: 93 mg/dL (ref 70–99)

## 2024-06-12 LAB — ABO/RH: ABO/RH(D): O POS

## 2024-06-12 SURGERY — ANTERIOR LATERAL LUMBAR FUSION 1 LEVEL
Anesthesia: General | Site: Spine Lumbar

## 2024-06-12 MED ORDER — THROMBIN 5000 UNITS EX KIT
PACK | CUTANEOUS | Status: AC
  Filled 2024-06-12: qty 1

## 2024-06-12 MED ORDER — ALBUMIN HUMAN 5 % IV SOLN: INTRAVENOUS | Status: DC | PRN

## 2024-06-12 MED ORDER — ONDANSETRON HCL 4 MG PO TABS: 4.0000 mg | ORAL_TABLET | Freq: Four times a day (QID) | ORAL | Status: DC | PRN

## 2024-06-12 MED ORDER — GABAPENTIN 300 MG PO CAPS
300.0000 mg | ORAL_CAPSULE | Freq: Every evening | ORAL | Status: DC
  Administered 2024-06-12: 300 mg via ORAL
  Filled 2024-06-12: qty 1

## 2024-06-12 MED ORDER — HYDROCHLOROTHIAZIDE 12.5 MG PO TABS
12.5000 mg | ORAL_TABLET | Freq: Every day | ORAL | Status: DC
  Administered 2024-06-13: 12.5 mg via ORAL
  Filled 2024-06-12: qty 1

## 2024-06-12 MED ORDER — PHENOL 1.4 % MT LIQD: 1.0000 | OROMUCOSAL | Status: DC | PRN

## 2024-06-12 MED ORDER — SODIUM CHLORIDE 0.9 % IV SOLN
0.1500 ug/kg/min | Freq: Once | INTRAVENOUS | Status: AC
  Administered 2024-06-12: .1 ug/kg/min via INTRAVENOUS
  Filled 2024-06-12: qty 2000

## 2024-06-12 MED ORDER — CHLORHEXIDINE GLUCONATE CLOTH 2 % EX PADS: 6.0000 | MEDICATED_PAD | Freq: Once | CUTANEOUS | Status: DC

## 2024-06-12 MED ORDER — AMISULPRIDE (ANTIEMETIC) 5 MG/2ML IV SOLN: 10.0000 mg | Freq: Once | INTRAVENOUS | Status: DC | PRN

## 2024-06-12 MED ORDER — FENTANYL CITRATE (PF) 250 MCG/5ML IJ SOLN
INTRAMUSCULAR | Status: DC | PRN
  Administered 2024-06-12: 25 ug via INTRAVENOUS
  Administered 2024-06-12: 50 ug via INTRAVENOUS

## 2024-06-12 MED ORDER — MIDAZOLAM HCL 2 MG/2ML IJ SOLN
INTRAMUSCULAR | Status: AC
  Filled 2024-06-12: qty 2

## 2024-06-12 MED ORDER — ADULT MULTIVITAMIN W/MINERALS CH
1.0000 | ORAL_TABLET | Freq: Every day | ORAL | Status: DC
  Administered 2024-06-13: 1 via ORAL
  Filled 2024-06-12: qty 1

## 2024-06-12 MED ORDER — DEXAMETHASONE SODIUM PHOSPHATE 10 MG/ML IJ SOLN
INTRAMUSCULAR | Status: DC | PRN
  Administered 2024-06-12: 10 mg via INTRAVENOUS

## 2024-06-12 MED ORDER — MENTHOL 3 MG MT LOZG: 1.0000 | LOZENGE | OROMUCOSAL | Status: DC | PRN

## 2024-06-12 MED ORDER — LORAZEPAM 0.5 MG PO TABS
1.0000 mg | ORAL_TABLET | Freq: Three times a day (TID) | ORAL | Status: DC | PRN
  Administered 2024-06-12: 1 mg via ORAL
  Filled 2024-06-12: qty 2

## 2024-06-12 MED ORDER — CHLORHEXIDINE GLUCONATE 0.12 % MT SOLN
15.0000 mL | Freq: Once | OROMUCOSAL | Status: AC
  Administered 2024-06-12: 15 mL via OROMUCOSAL
  Filled 2024-06-12: qty 15

## 2024-06-12 MED ORDER — SODIUM CHLORIDE 0.9 % IV SOLN: 250.0000 mL | INTRAVENOUS | Status: AC

## 2024-06-12 MED ORDER — PHENYLEPHRINE 80 MCG/ML (10ML) SYRINGE FOR IV PUSH (FOR BLOOD PRESSURE SUPPORT)
PREFILLED_SYRINGE | INTRAVENOUS | Status: DC | PRN
  Administered 2024-06-12 (×2): 160 ug via INTRAVENOUS
  Administered 2024-06-12: 120 ug via INTRAVENOUS
  Administered 2024-06-12: 160 ug via INTRAVENOUS

## 2024-06-12 MED ORDER — MIDAZOLAM HCL 2 MG/2ML IJ SOLN
INTRAMUSCULAR | Status: DC | PRN
  Administered 2024-06-12: 2 mg via INTRAVENOUS

## 2024-06-12 MED ORDER — PHENYLEPHRINE HCL-NACL 20-0.9 MG/250ML-% IV SOLN
INTRAVENOUS | Status: DC | PRN
  Administered 2024-06-12: 20 ug/min via INTRAVENOUS

## 2024-06-12 MED ORDER — PROPOFOL 10 MG/ML IV BOLUS
INTRAVENOUS | Status: DC | PRN
  Administered 2024-06-12: 150 mg via INTRAVENOUS

## 2024-06-12 MED ORDER — NITROGLYCERIN 0.4 MG SL SUBL: 0.4000 mg | SUBLINGUAL_TABLET | SUBLINGUAL | Status: DC | PRN

## 2024-06-12 MED ORDER — CEFAZOLIN SODIUM-DEXTROSE 2-4 GM/100ML-% IV SOLN
2.0000 g | Freq: Three times a day (TID) | INTRAVENOUS | Status: AC
  Administered 2024-06-12 (×2): 2 g via INTRAVENOUS
  Filled 2024-06-12 (×2): qty 100

## 2024-06-12 MED ORDER — DOCUSATE SODIUM 100 MG PO CAPS
100.0000 mg | ORAL_CAPSULE | Freq: Two times a day (BID) | ORAL | Status: DC
  Administered 2024-06-12 – 2024-06-13 (×2): 100 mg via ORAL
  Filled 2024-06-12 (×2): qty 1

## 2024-06-12 MED ORDER — ACETAMINOPHEN 10 MG/ML IV SOLN
INTRAVENOUS | Status: DC | PRN
  Administered 2024-06-12: 1000 mg via INTRAVENOUS

## 2024-06-12 MED ORDER — ONDANSETRON HCL 4 MG/2ML IJ SOLN: 4.0000 mg | Freq: Four times a day (QID) | INTRAMUSCULAR | Status: DC | PRN

## 2024-06-12 MED ORDER — FENTANYL CITRATE (PF) 100 MCG/2ML IJ SOLN
INTRAMUSCULAR | Status: AC
Start: 2024-06-12 — End: 2024-06-12
  Filled 2024-06-12: qty 2

## 2024-06-12 MED ORDER — STERILE WATER FOR IRRIGATION IR SOLN
Status: DC | PRN
Start: 2024-06-12 — End: 2024-06-12
  Administered 2024-06-12: 1000 mL

## 2024-06-12 MED ORDER — METHOCARBAMOL 500 MG PO TABS
500.0000 mg | ORAL_TABLET | Freq: Four times a day (QID) | ORAL | Status: DC | PRN
  Administered 2024-06-12 – 2024-06-13 (×3): 500 mg via ORAL
  Filled 2024-06-12 (×3): qty 1

## 2024-06-12 MED ORDER — OXYCODONE HCL 5 MG PO TABS: 5.0000 mg | ORAL_TABLET | ORAL | Status: DC | PRN

## 2024-06-12 MED ORDER — BACITRACIN ZINC 500 UNIT/GM EX OINT
TOPICAL_OINTMENT | CUTANEOUS | Status: AC
  Filled 2024-06-12: qty 28.35

## 2024-06-12 MED ORDER — METHOCARBAMOL 1000 MG/10ML IJ SOLN: 500.0000 mg | Freq: Four times a day (QID) | INTRAMUSCULAR | Status: DC | PRN

## 2024-06-12 MED ORDER — BISACODYL 10 MG RE SUPP: 10.0000 mg | Freq: Every day | RECTAL | Status: DC | PRN

## 2024-06-12 MED ORDER — DULOXETINE HCL 60 MG PO CPEP
60.0000 mg | ORAL_CAPSULE | Freq: Two times a day (BID) | ORAL | Status: DC
  Administered 2024-06-12 – 2024-06-13 (×2): 60 mg via ORAL
  Filled 2024-06-12 (×2): qty 1

## 2024-06-12 MED ORDER — MORPHINE SULFATE (PF) 2 MG/ML IV SOLN: 2.0000 mg | INTRAVENOUS | Status: DC | PRN

## 2024-06-12 MED ORDER — OXYCODONE HCL 5 MG/5ML PO SOLN: 5.0000 mg | Freq: Once | ORAL | Status: DC | PRN

## 2024-06-12 MED ORDER — ORAL CARE MOUTH RINSE: 15.0000 mL | Freq: Once | OROMUCOSAL | Status: AC

## 2024-06-12 MED ORDER — THROMBIN 5000 UNITS EX SOLR
CUTANEOUS | Status: DC | PRN
  Administered 2024-06-12: 5000 [IU] via TOPICAL

## 2024-06-12 MED ORDER — SODIUM CHLORIDE 0.9% FLUSH: 3.0000 mL | INTRAVENOUS | Status: DC | PRN

## 2024-06-12 MED ORDER — ISOSORBIDE MONONITRATE ER 60 MG PO TB24
120.0000 mg | ORAL_TABLET | Freq: Every day | ORAL | Status: DC
  Administered 2024-06-13: 120 mg via ORAL
  Filled 2024-06-12: qty 2

## 2024-06-12 MED ORDER — ROCURONIUM BROMIDE 10 MG/ML (PF) SYRINGE
PREFILLED_SYRINGE | INTRAVENOUS | Status: DC | PRN
  Administered 2024-06-12: 40 mg via INTRAVENOUS
  Administered 2024-06-12: 20 mg via INTRAVENOUS

## 2024-06-12 MED ORDER — MECLIZINE HCL 25 MG PO TABS: 25.0000 mg | ORAL_TABLET | Freq: Three times a day (TID) | ORAL | Status: DC | PRN

## 2024-06-12 MED ORDER — LACTATED RINGERS IV SOLN: INTRAVENOUS | Status: DC

## 2024-06-12 MED ORDER — ACETAMINOPHEN 325 MG PO TABS
650.0000 mg | ORAL_TABLET | ORAL | Status: DC | PRN
Start: 2024-06-12 — End: 2024-06-13

## 2024-06-12 MED ORDER — METOPROLOL SUCCINATE ER 50 MG PO TB24
50.0000 mg | ORAL_TABLET | Freq: Every day | ORAL | Status: DC
  Administered 2024-06-13: 50 mg via ORAL
  Filled 2024-06-12: qty 1

## 2024-06-12 MED ORDER — LIDOCAINE 2% (20 MG/ML) 5 ML SYRINGE
INTRAMUSCULAR | Status: DC | PRN
  Administered 2024-06-12: 60 mg via INTRAVENOUS

## 2024-06-12 MED ORDER — RANOLAZINE ER 500 MG PO TB12
1000.0000 mg | ORAL_TABLET | Freq: Two times a day (BID) | ORAL | Status: DC
  Administered 2024-06-12 – 2024-06-13 (×2): 1000 mg via ORAL
  Filled 2024-06-12 (×2): qty 2

## 2024-06-12 MED ORDER — PANTOPRAZOLE SODIUM 40 MG PO TBEC
40.0000 mg | DELAYED_RELEASE_TABLET | Freq: Every day | ORAL | Status: DC
  Administered 2024-06-13: 40 mg via ORAL
  Filled 2024-06-12: qty 1

## 2024-06-12 MED ORDER — OXYCODONE HCL 5 MG PO TABS
10.0000 mg | ORAL_TABLET | ORAL | Status: DC | PRN
  Administered 2024-06-12 – 2024-06-13 (×5): 10 mg via ORAL
  Filled 2024-06-12 (×5): qty 2

## 2024-06-12 MED ORDER — LIDOCAINE-EPINEPHRINE 1 %-1:100000 IJ SOLN
INTRAMUSCULAR | Status: DC | PRN
  Administered 2024-06-12: 22 mL

## 2024-06-12 MED ORDER — PROPOFOL 500 MG/50ML IV EMUL
INTRAVENOUS | Status: DC | PRN
  Administered 2024-06-12: 120 ug/kg/min via INTRAVENOUS
  Administered 2024-06-12: 150 ug/kg/min via INTRAVENOUS

## 2024-06-12 MED ORDER — ACETAMINOPHEN 10 MG/ML IV SOLN
INTRAVENOUS | Status: AC
  Filled 2024-06-12: qty 100

## 2024-06-12 MED ORDER — SUGAMMADEX SODIUM 200 MG/2ML IV SOLN
INTRAVENOUS | Status: DC | PRN
  Administered 2024-06-12: 200 mg via INTRAVENOUS

## 2024-06-12 MED ORDER — FENTANYL CITRATE (PF) 100 MCG/2ML IJ SOLN
25.0000 ug | INTRAMUSCULAR | Status: DC | PRN
  Administered 2024-06-12: 50 ug via INTRAVENOUS

## 2024-06-12 MED ORDER — LIDOCAINE-EPINEPHRINE 1 %-1:100000 IJ SOLN
INTRAMUSCULAR | Status: AC
  Filled 2024-06-12: qty 1

## 2024-06-12 MED ORDER — ACETAMINOPHEN 650 MG RE SUPP: 650.0000 mg | RECTAL | Status: DC | PRN

## 2024-06-12 MED ORDER — OXYCODONE HCL 5 MG PO TABS: 5.0000 mg | ORAL_TABLET | Freq: Once | ORAL | Status: DC | PRN

## 2024-06-12 MED ORDER — VANCOMYCIN HCL IN DEXTROSE 1-5 GM/200ML-% IV SOLN
1000.0000 mg | INTRAVENOUS | Status: AC
  Administered 2024-06-12: 1000 mg via INTRAVENOUS
  Filled 2024-06-12: qty 200

## 2024-06-12 MED ORDER — SODIUM CHLORIDE 0.9% FLUSH
3.0000 mL | Freq: Two times a day (BID) | INTRAVENOUS | Status: DC
  Administered 2024-06-12 – 2024-06-13 (×2): 3 mL via INTRAVENOUS

## 2024-06-12 MED ORDER — SODIUM CHLORIDE 0.9 % IV SOLN: INTRAVENOUS | Status: DC

## 2024-06-12 MED ORDER — 0.9 % SODIUM CHLORIDE (POUR BTL) OPTIME
TOPICAL | Status: DC | PRN
  Administered 2024-06-12: 1000 mL

## 2024-06-12 MED ORDER — ROSUVASTATIN CALCIUM 20 MG PO TABS
40.0000 mg | ORAL_TABLET | Freq: Every day | ORAL | Status: DC
  Administered 2024-06-13: 40 mg via ORAL
  Filled 2024-06-12: qty 2

## 2024-06-12 MED ORDER — SOLRIAMFETOL HCL 150 MG PO TABS: 150.0000 mg | ORAL_TABLET | ORAL | Status: DC

## 2024-06-12 MED ORDER — SENNA 8.6 MG PO TABS
1.0000 | ORAL_TABLET | Freq: Two times a day (BID) | ORAL | Status: DC
  Administered 2024-06-12 – 2024-06-13 (×2): 8.6 mg via ORAL
  Filled 2024-06-12 (×2): qty 1

## 2024-06-12 MED ORDER — EPHEDRINE SULFATE-NACL 50-0.9 MG/10ML-% IV SOSY
PREFILLED_SYRINGE | INTRAVENOUS | Status: DC | PRN
  Administered 2024-06-12 (×3): 5 mg via INTRAVENOUS

## 2024-06-12 MED ORDER — CARIPRAZINE HCL 1.5 MG PO CAPS
3.0000 mg | ORAL_CAPSULE | Freq: Every day | ORAL | Status: DC
  Administered 2024-06-13: 3 mg via ORAL
  Filled 2024-06-12: qty 2

## 2024-06-12 MED ORDER — LACTATED RINGERS IV SOLN: INTRAVENOUS | Status: DC | PRN

## 2024-06-12 MED ORDER — LORATADINE 10 MG PO TABS
10.0000 mg | ORAL_TABLET | Freq: Every day | ORAL | Status: DC
  Filled 2024-06-12: qty 1

## 2024-06-12 MED ORDER — BUPIVACAINE HCL (PF) 0.5 % IJ SOLN
INTRAMUSCULAR | Status: AC
  Filled 2024-06-12: qty 30

## 2024-06-12 MED ORDER — ONDANSETRON HCL 4 MG/2ML IJ SOLN
INTRAMUSCULAR | Status: DC | PRN
  Administered 2024-06-12: 4 mg via INTRAVENOUS

## 2024-06-12 MED ORDER — FENTANYL CITRATE (PF) 250 MCG/5ML IJ SOLN
INTRAMUSCULAR | Status: AC
  Filled 2024-06-12: qty 5

## 2024-06-12 MED ORDER — EZETIMIBE 10 MG PO TABS
10.0000 mg | ORAL_TABLET | Freq: Every day | ORAL | Status: DC
  Administered 2024-06-13: 10 mg via ORAL
  Filled 2024-06-12: qty 1

## 2024-06-12 MED ORDER — ACETAMINOPHEN 10 MG/ML IV SOLN: 1000.0000 mg | Freq: Once | INTRAVENOUS | Status: DC | PRN

## 2024-06-12 SURGICAL SUPPLY — 61 items
ALLOGRAFT BONE FIBER KORE 5 (Bone Implant) IMPLANT
BAG COUNTER SPONGE SURGICOUNT (BAG) ×2 IMPLANT
BLADE CLIPPER SURG (BLADE) IMPLANT
CAP PUSHER PTP (ORTHOPEDIC DISPOSABLE SUPPLIES) IMPLANT
CATH ROBINSON RED A/P 12FR (CATHETERS) IMPLANT
CLIP SPRING STIM LLIF SAFEOP (CLIP) IMPLANT
DERMABOND ADVANCED .7 DNX12 (GAUZE/BANDAGES/DRESSINGS) ×4 IMPLANT
DILATOR INSULATED LLIF 8-13-18 (NEUROSURGERY SUPPLIES) IMPLANT
DISSECTOR BLUNT TIP ENDO 5MM (MISCELLANEOUS) ×2 IMPLANT
DRAPE C-ARM 42X72 X-RAY (DRAPES) ×2 IMPLANT
DRAPE C-ARMOR (DRAPES) ×2 IMPLANT
DRAPE LAPAROTOMY 100X72X124 (DRAPES) ×2 IMPLANT
DRAPE SHEET LG 3/4 BI-LAMINATE (DRAPES) ×8 IMPLANT
DRSG OPSITE POSTOP 3X4 (GAUZE/BANDAGES/DRESSINGS) IMPLANT
DRSG OPSITE POSTOP 4X6 (GAUZE/BANDAGES/DRESSINGS) IMPLANT
DURAPREP 26ML APPLICATOR (WOUND CARE) ×2 IMPLANT
ELECTRODE KT SAFEOP SSEP/SURF (KITS) IMPLANT
ELECTRODE REM PT RTRN 9FT ADLT (ELECTROSURGICAL) ×2 IMPLANT
EXTENDER BLADE LIF PTP 26 (INSTRUMENTS) IMPLANT
FEE COVERAGE SUPPORT O-ARM (MISCELLANEOUS) ×2 IMPLANT
GAUZE 4X4 16PLY ~~LOC~~+RFID DBL (SPONGE) IMPLANT
GLOVE BIO SURGEON STRL SZ7 (GLOVE) IMPLANT
GLOVE BIOGEL PI IND STRL 7.0 (GLOVE) IMPLANT
GLOVE BIOGEL PI IND STRL 7.5 (GLOVE) ×4 IMPLANT
GLOVE ECLIPSE 7.0 STRL STRAW (GLOVE) ×2 IMPLANT
GLOVE EXAM NITRILE XL STR (GLOVE) IMPLANT
GOWN STRL REUS W/ TWL LRG LVL3 (GOWN DISPOSABLE) IMPLANT
GOWN STRL REUS W/ TWL XL LVL3 (GOWN DISPOSABLE) ×4 IMPLANT
GOWN STRL REUS W/TWL 2XL LVL3 (GOWN DISPOSABLE) IMPLANT
GUIDEWIRE LLIF TT 310 (WIRE) IMPLANT
KIT BASIN OR (CUSTOM PROCEDURE TRAY) ×2 IMPLANT
KIT INFUSE X SMALL 1.4CC (Orthopedic Implant) IMPLANT
KIT TURNOVER KIT B (KITS) ×2 IMPLANT
KNIFE ANNULOTOMY SINGLE STL (ORTHOPEDIC DISPOSABLE SUPPLIES) IMPLANT
MARKER SPHERE PSV REFLC NDI (MISCELLANEOUS) ×10 IMPLANT
MODULE POSITIONING LIF 2.0 (INSTRUMENTS) IMPLANT
NDL HYPO 22X1.5 SAFETY MO (MISCELLANEOUS) ×2 IMPLANT
NEEDLE HYPO 22X1.5 SAFETY MO (MISCELLANEOUS) ×2 IMPLANT
NS IRRIG 1000ML POUR BTL (IV SOLUTION) ×2 IMPLANT
PACK LAMINECTOMY NEURO (CUSTOM PROCEDURE TRAY) ×2 IMPLANT
PIN BONE FIX 100 (PIN) IMPLANT
PROBE BALL TIP LLIF SAFEOP (NEUROSURGERY SUPPLIES) IMPLANT
ROD TI MIS LORDOTIC V12 5.5X45 (Rod) IMPLANT
SCREW CANN FENESTRA 5.5X50 (Screw) IMPLANT
SCREW CANN FENESTRA 6.5X50 (Screw) IMPLANT
SET SCREW SPNE (Screw) IMPLANT
SHIM INTRADISCAL PTP WIDE (ORTHOPEDIC DISPOSABLE SUPPLIES) IMPLANT
SPACER IDENTITI 8X22X50 10D (Spacer) IMPLANT
SPIKE FLUID TRANSFER (MISCELLANEOUS) ×2 IMPLANT
SPONGE SURGIFOAM ABS GEL SZ50 (HEMOSTASIS) IMPLANT
SPONGE T-LAP 4X18 ~~LOC~~+RFID (SPONGE) IMPLANT
SPONGE TONSIL 1 RF SGL (DISPOSABLE) IMPLANT
STAPLER SKIN PROX 35W (STAPLE) ×2 IMPLANT
SUT VIC AB 0 CT1 18XCR BRD8 (SUTURE) ×2 IMPLANT
SUT VIC AB 2-0 CT1 18 (SUTURE) ×2 IMPLANT
SUT VIC AB 3-0 SH 8-18 (SUTURE) ×2 IMPLANT
SYSTEM ILLUMINATION LIF STERIL (SYSTAGENIX WOUND MANAGEMENT) IMPLANT
TOWEL GREEN STERILE (TOWEL DISPOSABLE) ×2 IMPLANT
TOWEL GREEN STERILE FF (TOWEL DISPOSABLE) ×2 IMPLANT
TRAY FOLEY MTR SLVR 16FR STAT (SET/KITS/TRAYS/PACK) ×2 IMPLANT
WATER STERILE IRR 1000ML POUR (IV SOLUTION) ×2 IMPLANT

## 2024-06-12 NOTE — Progress Notes (Signed)
 Orthopedic Tech Progress Note Patient Details:  Jon Gonzales [birth year] [id number]  Ortho Devices Type of Ortho Device: Lumbar corsett Ortho Device/Splint Location: Back Ortho Device/Splint Interventions: Ordered      [name] 06/12/2024, 2:58 PM

## 2024-06-12 NOTE — Anesthesia Procedure Notes (Signed)
 Procedure Name: Intubation Date/Time: 06/12/2024 8:25 AM  Performed by: Virgil Ee, CRNAPre-anesthesia Checklist: Patient identified, Patient being monitored, Timeout performed, Emergency Drugs available and Suction available Patient Re-evaluated:Patient Re-evaluated prior to induction Oxygen Delivery Method: Circle system utilized Preoxygenation: Pre-oxygenation with 100% oxygen Induction Type: IV induction Ventilation: Mask ventilation without difficulty Laryngoscope Size: Glidescope and 4 Grade View: Grade I Tube type: Oral Tube size: 7.5 mm Number of attempts: 1 Airway Equipment and Method: Stylet Placement Confirmation: ETT inserted through vocal cords under direct vision, positive ETCO2 and breath sounds checked- equal and bilateral Secured at: 22 cm Tube secured with: Tape Dental Injury: Teeth and Oropharynx as per pre-operative assessment

## 2024-06-12 NOTE — H&P (Signed)
 Chief Complaint   No chief complaint on file.   History of Present Illness  Mr. Tallman is a 63 year old man seen now several months status post left L2-3 laminotomy microdiskectomy. While the patient had a few weeks of improvement in his left-sided radiculopathy postoperatively, he has been complaining of significant recurrence of similar pain significantly exacerbated after a fall suffered several weeks ago. At last visit I did order an MRI of the lumbar spine which has been completed. He continues to report essentially daily pain, limiting his ability to stand or walk for more than a few minutes at a time. He now presents for indirect decompression/fusion at L2-3.  Past Medical History   Past Medical History:  Diagnosis Date   Abnormal EKG 06/13/2023   Abnormal stress test small area of mild ischemia inferior wall 05/24/2022   Aphasia 06/13/2023   Arthropathy of lumbar facet joint 07/03/2023   Bilateral sacroiliitis (HCC) 08/29/2023   Bipolar 1 disorder 02/04/2023   Chronic bilateral low back pain with right-sided sciatica 06/24/2023   Chronic headaches 01/30/2021   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   Coronary artery disease involving coronary bypass graft of native heart with angina pectoris 05/22/2016   Cath 2013 LAD 95% stenosis, D1 90% stenosis, RCA 20%, Circ 30%. Previous stent to D1 2012. EF 60%.   Degeneration of lumbar intervertebral disc 07/03/2023   Dizziness 10/27/2020   Excessive daytime sleepiness 09/11/2016   Gastroesophageal reflux disease 02/04/2023   Generalized anxiety disorder 05/22/2016   Herpes zoster 01/20/2020   History of small bowel obstruction    Admitted 10-14-2007 for partical small bowel obstruction and N.G. tube was placed. Admitted by Dr Gerard. 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 Small internal hemorrhoids. Otherwise normal colonoscopy to terminal ileum   CORONARY ARTERY BYPASS GRAFT  02/19/2012   L - LAD, SVG - D1, SVG - OM   CORONARY STENT INTERVENTION N/A 11/05/2019   Procedure: CORONARY STENT INTERVENTION;  Surgeon: Court Dorn PARAS, MD;  Location: [facility];  Service: Cardiovascular;  Laterality: N/A;  SVG to DIAG   ESOPHAGOGASTRODUODENOSCOPY  01/08/2007   Multiple duodenal ulcers with stenosis/stricture of the distal first portion of the duodenum (measuring approximantely 9mm) Mild gastritis.   LEFT HEART CATH AND CORS/GRAFTS ANGIOGRAPHY N/A 11/05/2019   Procedure: LEFT HEART CATH AND CORS/GRAFTS ANGIOGRAPHY;  Surgeon: Court Dorn PARAS, MD;  Location: [facility];  Service: Cardiovascular;  Laterality: N/A;  LEFT HEART CATH AND CORS/GRAFTS ANGIOGRAPHY N/A 08/29/2023   Procedure: LEFT HEART CATH AND CORS/GRAFTS ANGIOGRAPHY;  Surgeon: Verlin Lonni BIRCH, MD;  Location: [facility];  Service: Cardiovascular;  Laterality: N/A;    LUMBAR LAMINECTOMY/DECOMPRESSION MICRODISCECTOMY Left 02/14/2024   Procedure: LUMBAR LAMINECTOMY/DECOMPRESSION MICRODISCECTOMY LEFT LUMBAR TWO-LUMBAR THREE;  Surgeon: Lanis Pupa, MD;  Location: [facility];  Service: Neurosurgery;  Laterality: Left;  MICRODISCECTOMY, LT, L23   RENAL ANGIOGRAPHY N/A 11/05/2019   Procedure: RENAL ANGIOGRAPHY;  Surgeon: Court Dorn PARAS, MD;  Location: [facility];  Service: Cardiovascular;  Laterality: N/A;   TONSILLECTOMY AND ADENOIDECTOMY     WRIST SURGERY Right     Social History   Social History   Tobacco Use   Smoking status: Never   Smokeless tobacco: Never  Vaping Use   Vaping status: Never Used  Substance Use Topics   Alcohol use: Yes    Alcohol/week: 14.0 standard drinks of alcohol    Types: 14 Cans of beer per week    Comment: every other day   Drug use: Never    Medications   Prior to Admission medications   Medication Sig Start Date End Date Taking? Authorizing Provider  acetaminophen  (TYLENOL ) 500 MG tablet Take 500-1,000 mg by mouth every 6 (six) hours as needed for moderate pain (pain score 4-6).   Yes [provider]  aspirin  EC (ASPIRIN  LOW DOSE) 81 MG tablet Take 1 tablet (81 mg total) by mouth daily. 04/30/24  Yes Henry Shaver B, NP  cariprazine  (VRAYLAR ) 3 MG capsule Take 1 capsule (3 mg total) by mouth daily. 05/29/24  Yes Cottle, Lorene KANDICE Raddle., MD  DULoxetine  (CYMBALTA ) 60 MG capsule Take 1 capsule (60 mg total) by mouth 2 (two) times daily. 03/02/24  Yes Cottle, Lorene KANDICE Raddle., MD  ezetimibe  (ZETIA ) 10 MG tablet Take 1 tablet (10 mg total) by mouth daily. 04/30/24  Yes Nicholaus Credit, PA-C  fexofenadine  (ALLEGRA ) 180 MG tablet Take 180 mg by mouth daily.   Yes [provider]  gabapentin  (NEURONTIN ) 300 MG capsule Take 1 capsule (300 mg total) by mouth every evening. 04/01/24  Yes   hydrochlorothiazide  (HYDRODIURIL ) 12.5 MG tablet Take 1 tablet (12.5 mg total) by mouth daily. 03/16/24  Yes Sirivol, Mamatha, MD   isosorbide  mononitrate (IMDUR ) 60 MG 24 hr tablet Take 2 tablets (120 mg total) by mouth daily. 04/30/24  Yes Nicholaus Credit, PA-C  LORazepam  (ATIVAN ) 1 MG tablet Take 1 tablet (1 mg total) by mouth every 8 (eight) hours as needed for anxiety. 06/08/24  Yes Cottle, Lorene KANDICE Raddle., MD  meclizine  (ANTIVERT ) 25 MG tablet Take 1 tablet (25 mg total) by mouth 3 (three) times daily as needed for dizziness. 10/03/22  Yes Abran Jerilynn Loving, MD  metoprolol  succinate (TOPROL -XL) 50 MG 24 hr tablet Take 1 tablet by mouth daily. Take with or immediately following a meal. 03/13/24  Yes Nicholaus Credit, PA-C  Multiple Vitamin (MULTIVITAMIN WITH MINERALS) TABS tablet Take 1 tablet by mouth daily. Men's Multivitamin   Yes [provider]  nitroGLYCERIN  (NITROSTAT ) 0.4 MG SL tablet Dissolve 1 tablet (0.4 mg total) under the tongue every 5 (five) minutes up to 3 doses as needed for chest pain. If no relief after 3 doses call 911. 09/04/23  Yes Nicholaus Credit, PA-C  pantoprazole  (PROTONIX ) 40 MG tablet Take 1 tablet (40 mg total) by mouth daily. 12/23/23  Yes Nicholaus Credit, PA-C  ranolazine  (RANEXA ) 1000 MG SR tablet Take 1 tablet by mouth  2 times daily. 06/05/24  Yes Krasowski, Robert J, MD  rosuvastatin  (CRESTOR ) 40 MG tablet Take 1 tablet (40 mg total) by mouth daily. 12/23/23  Yes Nicholaus Credit, PA-C  Semaglutide -Weight Management (WEGOVY ) 2.4 MG/0.75ML SOAJ Inject 2.4 mg into the skin once a week. 05/04/24  Yes Teressa Harrie HERO, FNP  Solriamfetol  HCl (SUNOSI ) 150 MG TABS Take 1 tablet (150 mg total) by mouth every morning. 03/04/24  Yes Dohmeier, Dedra, MD  clopidogrel  (PLAVIX ) 75 MG tablet Take 1 tablet (75 mg total) by mouth daily. 12/23/23   Nicholaus Credit, PA-C  cyclobenzaprine  (FLEXERIL ) 10 MG tablet Take 1 tablet (10 mg total) by mouth 3 (three) times daily as needed for muscle spasms. 02/14/24   Lanis Pupa, MD  fexofenadine  (ALLEGRA ) 60 MG tablet Take 1 tablet (60 mg total) by mouth daily. Patient not taking:  Reported on 06/11/2024 10/03/22   Abran Jerilynn Loving, MD  ondansetron  (ZOFRAN ) 4 MG tablet Take 1 tablet (4 mg total) by mouth every 8 (eight) hours as needed for nausea or vomiting. 10/13/22   Charlene Clotilda PARAS, NP  diltiazem  (CARDIZEM  SR) 60 MG 12 hr capsule Take 1 capsule (60 mg total) by mouth 2 (two) times daily. 05/02/20 11/07/20  Abran Jerilynn Loving, MD  pantoprazole  (PROTONIX ) 40 MG tablet Take 1 tablet (40 mg total) by mouth daily. 01/17/22   Charlene Clotilda PARAS, NP  triamterene -hydrochlorothiazide  (DYAZIDE) 37.5-25 MG capsule Take 1 each (1 capsule total) by mouth daily. 08/08/20 10/25/20  Abran Jerilynn Loving, MD    Allergies   Allergies  Allergen Reactions   Trintellix [Vortioxetine] Other (See Comments)    Headaches.   Penicillins Rash    Review of Systems  ROS  Neurologic Exam  Awake, alert, oriented Memory and concentration grossly intact Speech fluent, appropriate CN grossly intact Motor exam: Upper Extremities Deltoid Bicep Tricep Grip  Right 5/5 5/5 5/5 5/5  Left 5/5 5/5 5/5 5/5   Lower Extremities IP Quad PF DF EHL  Right 5/5 5/5 5/5 5/5 5/5  Left 5/5 5/5 5/5 5/5 5/5   Sensation grossly intact to LT  Imaging  MRI demonstrates continued lateral recess/foraminal stenosis due to disc height loss at L2-3.  Impression  - 63 y.o. male with recurrence of left lumbar radiculopathy several months after microdiscectomy related to stenosis at L2-3  Plan  - Will proceed with trans-psoas interbody fusion with posterior pedicle screw instrumentation, L2-3  I have reviewed the indications for the procedure as well as the details of the procedure and the expected postoperative course and recovery at length with the patient in the office. We have also reviewed in detail the risks, benefits, and alternatives to the procedure. All questions were answered and Elsie BIRCH Odonnell provided informed consent to proceed.  Pupa Lanis, MD [facility]

## 2024-06-12 NOTE — Transfer of Care (Signed)
 Immediate Anesthesia Transfer of Care Note  Patient: Jon Gonzales  Procedure(s) Performed: PRONE TRANSPOSOAS INTERBODY FUSION, LUMBAR TWO-LUMBAR THREE, POSTERIOR PERCUTANEOUS NON-SEGMENTAL  INSTRUMENTION, LUMBAR TWO-LUMBAR THREE (Spine Lumbar) APPLICATION OF O-ARM  Patient Location: PACU  Anesthesia Type:General  Level of Consciousness: drowsy, patient cooperative, and responds to stimulation  Airway & Oxygen Therapy: Patient Spontanous Breathing and Patient connected to face mask oxygen  Post-op Assessment: Report given to RN and Post -op Vital signs reviewed and stable  Post vital signs: Reviewed and stable  Last Vitals:  Vitals Value Taken Time  BP 131/81 06/12/24 11:51  Temp 36.6 C 06/12/24 11:51  Pulse 70 06/12/24 11:56  Resp 14 06/12/24 11:56  SpO2 96 % 06/12/24 11:56  Vitals shown include unfiled device data.  Last Pain:  Vitals:   06/12/24 0635  TempSrc: Oral  PainSc: 5          Complications: No notable events documented.

## 2024-06-13 ENCOUNTER — Other Ambulatory Visit (HOSPITAL_COMMUNITY): Payer: Self-pay

## 2024-06-13 DIAGNOSIS — M4726 Other spondylosis with radiculopathy, lumbar region: Secondary | ICD-10-CM | POA: Diagnosis not present

## 2024-06-13 MED ORDER — METHOCARBAMOL 500 MG PO TABS
500.0000 mg | ORAL_TABLET | Freq: Four times a day (QID) | ORAL | 1 refills | Status: DC | PRN
  Filled 2024-06-13: qty 30, 8d supply, fill #0

## 2024-06-13 MED ORDER — OXYCODONE-ACETAMINOPHEN 5-325 MG PO TABS
1.0000 | ORAL_TABLET | ORAL | 0 refills | Status: DC | PRN
  Filled 2024-06-13: qty 48, 4d supply, fill #0

## 2024-06-13 NOTE — Discharge Instructions (Signed)

## 2024-06-13 NOTE — Care Management (Signed)
 Patient with order to DC to home today. Unit staff to provide DME needed for home.   No HH needs identified Patient will have family/ friends provide transportation home. No other TOC needs identified for DC

## 2024-06-13 NOTE — Progress Notes (Signed)
 PT Note  Patient Details Name: Jon Gonzales MRN: [id number] DOB: 1961/09/05   Cancelled Treatment:    Reason Eval/Treat Not Completed: OT screened, no needs identified, will sign off  Spoke with occupational therapy after their initial evaluation. OT reports patient is functioning at a high level of independence and no physical therapy is indicated at this time. PT is signing-off. Please re-order if there is any significant change in status. Thank you for this referral.  Jon Gonzales, PT, DPT [facility] Services Physical Therapist Office: [phone] Website: [email or web address]   Jon Gonzales 06/13/2024, 9:09 AM

## 2024-06-13 NOTE — Anesthesia Postprocedure Evaluation (Signed)
 Anesthesia Post Note  Patient: Jon Gonzales  Procedure(s) Performed: PRONE TRANSPOSOAS INTERBODY FUSION, LUMBAR TWO-LUMBAR THREE, POSTERIOR PERCUTANEOUS NON-SEGMENTAL  INSTRUMENTION, LUMBAR TWO-LUMBAR THREE (Spine Lumbar) APPLICATION OF O-ARM     Patient location during evaluation: PACU Anesthesia Type: General Level of consciousness: awake Pain management: pain level controlled Vital Signs Assessment: post-procedure vital signs reviewed and stable Respiratory status: spontaneous breathing, nonlabored ventilation and respiratory function stable Cardiovascular status: blood pressure returned to baseline and stable Postop Assessment: no apparent nausea or vomiting Anesthetic complications: no   No notable events documented.  Last Vitals:  Vitals:   06/12/24 2239 06/13/24 0441  BP: 119/75 121/77  Pulse: 88 90  Resp: 18 20  Temp: 36.8 C 36.8 C  SpO2: 95% 96%    Last Pain:  Vitals:   06/13/24 0545  TempSrc:   PainSc: Asleep                 Morning Halberg P Mate Alegria

## 2024-06-13 NOTE — Progress Notes (Signed)
 Patient alert and oriented, void, ambulate. Surgical site clean and dry no sign of infection. D/c instructions explain and given to the patient. All questions answered.

## 2024-06-13 NOTE — Evaluation (Signed)
 Occupational Therapy Evaluation Patient Details Name: Jon Gonzales MRN: [id number] DOB: Mar 13, 1961 Today's Date: 06/13/2024   History of Present Illness   Pt is a 63 yr old male who presented 06/12/24 due to pain post L2-3 laminotomy. Pt s/p 06/12/24 trans-psoas interbody fusion.  Pt s/p PMH: aphasia, bipolar, chonic back pain, CAD, TIA     Clinical Impressions Pt reported at PLOF they were indep but limited by pain. He reported he will have the assist of his wife PRN with the return to home. Pt at this time was able to simulate UE/LB with mod I, ambulated without any DME and completed one flight of steps with rail on R side with only cues on back precautions. Pt was educated on brace, back precautions, IS use, progression of mobility, DME/AE if needed to modify tasks and car transfers. Acute Occupational Therapy signing off. Thank you.      If plan is discharge home, recommend the following:   Assistance with cooking/housework     Functional Status Assessment   Patient has had a recent decline in their functional status and demonstrates the ability to make significant improvements in function in a reasonable and predictable amount of time.     Equipment Recommendations   None recommended by OT     Recommendations for Other Services         Precautions/Restrictions   Precautions Precautions: Back Precaution Booklet Issued: Yes (comment) Recall of Precautions/Restrictions: Intact Required Braces or Orthoses: Spinal Brace Spinal Brace: Lumbar corset Restrictions Weight Bearing Restrictions Per Provider Order: No     Mobility Bed Mobility               General bed mobility comments: pt verbally reviewed back precautions with mobility as wanted to be up    Transfers Overall transfer level: Modified independent Equipment used: None                      Balance Overall balance assessment: Modified Independent                                          ADL either performed or assessed with clinical judgement   ADL Overall ADL's : Modified independent                                       General ADL Comments: cued just on back precautions but able to complete without DME/AE     Vision Baseline Vision/History: 1 Wears glasses Ability to See in Adequate Light: 0 Adequate Patient Visual Report: No change from baseline Vision Assessment?: Wears glasses for driving     Perception Perception: Within Functional Limits       Praxis Praxis: WFL       Pertinent Vitals/Pain Pain Assessment Pain Assessment: Faces Faces Pain Scale: Hurts a little bit Pain Location: sx site soreness Pain Descriptors / Indicators: Aching Pain Intervention(s): Limited activity within patient's tolerance, Monitored during session, Repositioned     Extremity/Trunk Assessment Upper Extremity Assessment Upper Extremity Assessment: Overall WFL for tasks assessed   Lower Extremity Assessment Lower Extremity Assessment:  (s/p back)   Cervical / Trunk Assessment Cervical / Trunk Assessment: Back Surgery   Communication Communication Communication: No apparent difficulties   Cognition Arousal: Alert Behavior During Therapy: [facility] for  tasks assessed/performed Cognition: No apparent impairments                               Following commands: Intact       Cueing  General Comments   Cueing Techniques: Verbal cues      Exercises     Shoulder Instructions      Home Living Family/patient expects to be discharged to:: Private residence Living Arrangements: Spouse/significant other Available Help at Discharge: Family Type of Home: House Home Access: Stairs to enter Secretary/administrator of Steps: flight Entrance Stairs-Rails: Right;Left Home Layout: One level     Bathroom Shower/Tub: Producer, television/film/video: Standard Bathroom Accessibility: Yes   Home Equipment: Shower  seat          Prior Functioning/Environment Prior Level of Function : Independent/Modified Independent             Mobility Comments: indep but in pain ADLs Comments: indep    OT Problem List: Decreased strength;Impaired balance (sitting and/or standing);Decreased safety awareness;Decreased knowledge of use of DME or AE;Pain   OT Treatment/Interventions:        OT Goals(Current goals can be found in the care plan section)   Acute Rehab OT Goals Patient Stated Goal: to go home OT Goal Formulation: With patient Time For Goal Achievement: 06/27/24 Potential to Achieve Goals: Good   OT Frequency:       Co-evaluation              AM-PAC OT 6 Clicks Daily Activity     Outcome Measure Help from another person eating meals?: None Help from another person taking care of personal grooming?: None Help from another person toileting, which includes using toliet, bedpan, or urinal?: None Help from another person bathing (including washing, rinsing, drying)?: None Help from another person to put on and taking off regular upper body clothing?: None Help from another person to put on and taking off regular lower body clothing?: None 6 Click Score: 24   End of Session Equipment Utilized During Treatment: Gait belt Nurse Communication: Mobility status  Activity Tolerance: Patient tolerated treatment well Patient left: with call bell/phone within reach (standing in room)  OT Visit Diagnosis: Unsteadiness on feet (R26.81);Other abnormalities of gait and mobility (R26.89);Repeated falls (R29.6);Muscle weakness (generalized) (M62.81);Pain Pain - Right/Left:  (back)                Time: 9175-9155 OT Time Calculation (min): 20 min Charges:  OT General Charges $OT Visit: 1 Visit OT Evaluation $OT Eval Low Complexity: 1 Low  Warrick POUR OTR/L  Acute Rehab Services  [phone] office number   Warrick Berber 06/13/2024, 8:58 AM

## 2024-06-13 NOTE — Discharge Summary (Signed)
 Physician Discharge Summary     Providing Compassionate, Quality Care - Together   Patient ID: Jon Gonzales MRN: [id number] DOB/AGE: [birth year] 62 y.o.  Admit date: 06/12/2024 Discharge date: 06/13/2024  Admission Diagnoses: Lumbar spondylosis  Discharge Diagnoses:  Principal Problem:   Lumbar spondylolysis   Discharged Condition: good  Hospital Course: Patient underwent an L2-3 transpsoas interbody fusion by Dr. Lanis on 06/13/2024. He was admitted to 3C05 following recovery from anesthesia in the PACU. His postoperative course has been uncomplicated. He has worked with occupational therapy who feels the patient is ready for discharge home. He is ambulating independently and without difficulty. He is tolerating a normal diet. He is not having any bowel or bladder dysfunction. His pain is well-controlled with oral pain medication. He is ready for discharge home.   Consults: OT/TOC  Significant Diagnostic Studies: radiology: DG Lumbar Spine 2-3 Views Result Date: 06/12/2024 CLINICAL DATA:  Surgical posterior fusion of L2-3. EXAM: LUMBAR SPINE - 2-3 VIEW; DG C-ARM 1-60 MIN-NO REPORT Radiation exposure index: 13.9 mGy. COMPARISON:  MRI of May 01, 2024. FINDINGS: Two intraoperative fluoroscopic images were obtained of the lumbar spine. These demonstrate the patient be status post surgical posterior fusion of L2-3 with bilateral in pedicular screw placement and interbody fusion. IMPRESSION: Fluoroscopic guidance provided during surgical posterior fusion of L2-3. Electronically Signed   By: Lynwood Landy Raddle M.D.   On: 06/12/2024 13:29   DG C-Arm 1-60 Min-No Report Result Date: 06/12/2024 Fluoroscopy was utilized by the requesting physician.  No radiographic interpretation.   DG C-Arm 1-60 Min-No Report Result Date: 06/12/2024 Fluoroscopy was utilized by the requesting physician.  No radiographic interpretation.   DG O-ARM IMAGE ONLY/NO REPORT Result Date: 06/12/2024 There is no  Radiologist interpretation  for this exam.    Treatments: surgery:   PRONE TRANSPOSOAS INTERBODY FUSION, LUMBAR TWO-LUMBAR THREE, POSTERIOR PERCUTANEOUS NON-SEGMENTAL  INSTRUMENTION, LUMBAR TWO-LUMBAR THREE (Spine Lumbar) APPLICATION OF O-ARM    Discharge Exam: Blood pressure 127/87, pulse 87, temperature (!) 97.5 F (36.4 C), temperature source Oral, resp. rate 20, SpO2 95%.  Alert and oriented x 4 PERRLA CN II-XII grossly intact MAE, Strength and sensation intact Incision is covered with Honeycomb dressing and Steri Strips; Dressing is clean, dry, and intact   Disposition: Discharge disposition: 01-Home or Self Care       Discharge Instructions     Call MD for:  difficulty breathing, headache or visual disturbances   Complete by: As directed    Call MD for:  hives   Complete by: As directed    Call MD for:  persistant nausea and vomiting   Complete by: As directed    Call MD for:  redness, tenderness, or signs of infection (pain, swelling, redness, odor or green/yellow discharge around incision site)   Complete by: As directed    Call MD for:  severe uncontrolled pain   Complete by: As directed    Diet - low sodium heart healthy   Complete by: As directed    If the dressing is still on your incision site when you go home, remove it on the third day after your surgery date. Remove dressing if it begins to fall off, or if it is dirty or damaged before the third day.   Complete by: As directed    Incentive spirometry RT   Complete by: As directed    Increase activity slowly   Complete by: As directed       Allergies as of 06/13/2024  Reactions   Trintellix [vortioxetine] Other (See Comments)   Headaches.   Penicillins Rash        Medication List     PAUSE taking these medications    clopidogrel  75 MG tablet Wait to take this until: June 15, 2024 Commonly known as: PLAVIX  Take 1 tablet (75 mg total) by mouth daily.       STOP taking  these medications    cyclobenzaprine  10 MG tablet Commonly known as: FLEXERIL        TAKE these medications    acetaminophen  500 MG tablet Commonly known as: TYLENOL  Take 500-1,000 mg by mouth every 6 (six) hours as needed for moderate pain (pain score 4-6).   Aspirin  Low Dose 81 MG tablet Generic drug: aspirin  EC Take 1 tablet (81 mg total) by mouth daily.   DULoxetine  60 MG capsule Commonly known as: CYMBALTA  Take 1 capsule (60 mg total) by mouth 2 (two) times daily.   ezetimibe  10 MG tablet Commonly known as: ZETIA  Take 1 tablet (10 mg total) by mouth daily.   fexofenadine  180 MG tablet Commonly known as: ALLEGRA  Take 180 mg by mouth daily.   fexofenadine  60 MG tablet Commonly known as: ALLEGRA  Take 1 tablet (60 mg total) by mouth daily.   gabapentin  300 MG capsule Commonly known as: NEURONTIN  Take 1 capsule (300 mg total) by mouth every evening.   hydrochlorothiazide  12.5 MG tablet Commonly known as: HYDRODIURIL  Take 1 tablet (12.5 mg total) by mouth daily.   isosorbide  mononitrate 60 MG 24 hr tablet Commonly known as: IMDUR  Take 2 tablets (120 mg total) by mouth daily.   LORazepam  1 MG tablet Commonly known as: ATIVAN  Take 1 tablet (1 mg total) by mouth every 8 (eight) hours as needed for anxiety.   meclizine  25 MG tablet Commonly known as: ANTIVERT  Take 1 tablet (25 mg total) by mouth 3 (three) times daily as needed for dizziness.   methocarbamol  500 MG tablet Commonly known as: ROBAXIN  Take 1 tablet (500 mg total) by mouth every 6 (six) hours as needed for muscle spasms.   metoprolol  succinate 50 MG 24 hr tablet Commonly known as: TOPROL -XL Take 1 tablet by mouth daily. Take with or immediately following a meal.   multivitamin with minerals Tabs tablet Take 1 tablet by mouth daily. Men's Multivitamin   nitroGLYCERIN  0.4 MG SL tablet Commonly known as: NITROSTAT  Dissolve 1 tablet (0.4 mg total) under the tongue every 5 (five) minutes up to 3  doses as needed for chest pain. If no relief after 3 doses call 911.   ondansetron  4 MG tablet Commonly known as: Zofran  Take 1 tablet (4 mg total) by mouth every 8 (eight) hours as needed for nausea or vomiting.   oxyCODONE -acetaminophen  5-325 MG tablet Commonly known as: Percocet Take 1-2 tablets by mouth every 4 (four) hours as needed.   pantoprazole  40 MG tablet Commonly known as: PROTONIX  Take 1 tablet (40 mg total) by mouth daily.   ranolazine  1000 MG SR tablet Commonly known as: RANEXA  Take 1 tablet by mouth 2 times daily.   rosuvastatin  40 MG tablet Commonly known as: CRESTOR  Take 1 tablet (40 mg total) by mouth daily.   Sunosi  150 MG Tabs Generic drug: Solriamfetol  HCl Take 1 tablet (150 mg total) by mouth every morning.   Vraylar  3 MG capsule Generic drug: cariprazine  Take 1 capsule (3 mg total) by mouth daily.   Wegovy  2.4 MG/0.75ML Soaj SQ injection Generic drug: semaglutide -weight management Inject 2.4 mg into the  skin once a week.               Discharge Care Instructions  (From admission, onward)           Start     Ordered   06/13/24 0000  If the dressing is still on your incision site when you go home, remove it on the third day after your surgery date. Remove dressing if it begins to fall off, or if it is dirty or damaged before the third day.        06/13/24 1147            Follow-up Information     Lanis Pupa, MD. Go on 07/08/2024.   Specialty: Neurosurgery Why: First post op appointment with x-rays is on 07/08/2024 at 3:30 PM. Contact information: [address] [place] [address] [phone]                 Signed: Gerard Beck, DNP, AGNP-C Nurse Practitioner  [facility] [address], [place], [place] [address] P: [phone]    F: [phone]  06/13/2024, 11:48 AM

## 2024-06-15 NOTE — Op Note (Signed)
 NEUROSURGERY OPERATIVE NOTE   PREOP DIAGNOSIS:  Lumbar spondylosis Lumbar stenosis, L2-3 with radiculopathy   POSTOP DIAGNOSIS: Same  PROCEDURE: Trans-psoas approach for discectomy, interbody cage placement L2-3 - Alphatec static cage  Interbody arthrodesis, L2-3 Posterior non-segmental instrumentation, L2-3 - Alphatec percutaneous pedicle screws Use of morcellized bone allograft, BMP Intraoperative electrophysiologic monitoring Use of intraoperative stereotactic navigation  SURGEON: Dr. Gerldine Maizes, MD  ASSISTANT: Camie Pickle, PA-C  ANESTHESIA: General Endotracheal  EBL: Minimal  SPECIMENS: None  DRAINS: None  COMPLICATIONS: None immediate  CONDITION: Hemodynamically stable to PACU  HISTORY: Jon Gonzales is a 63 y.o. male initially seen in the outpatient neurosurgery clinic complaining primarily of left-sided back and leg pain.  He initially underwent left-sided L2-3 laminotomy and microdiscectomy with transient improvement in symptoms.  Unfortunately he returned back to the clinic complaining of recurrence of similar left-sided back and leg pain.  Repeat MRI demonstrated continued lateral recess and foraminal stenosis primarily due to asymmetric disc height loss at L2-3 with significant associated disc degenerative disease and reactive endplate change.  We attempted conservative treatments including medical and injection therapy without significant improvement.  He therefore elected to proceed with surgical decompression and fusion.  The risks, benefits, and alternatives to surgery were all reviewed in detail with the patient.  After all his questions were answered informed consent was obtained and witnessed.  PROCEDURE IN DETAIL: The patient was brought to the operating room. After induction of general anesthesia, the patient was positioned on the operative table in the prone position. All pressure points were meticulously padded.  Skin of the low back and left  flank were then prepped and draped in the usual sterile fashion.  After timeout was conducted, a small stab incision was made over the right posterior superior iliac spine.  A Schanz pin was placed and connected to the stereotactic array.  Intraoperative CT scan was taken.  Excellent accuracy was achieved.  The stereotactic system was used to mark out the surface projection of the L2 and L3 pedicles.  Parallel paramedian skin incisions were then infiltrated with local anesthetic with epinephrine .  Incisions were then made sharply and carried down through subcutaneous tissue.  The lumbodorsal fascia was identified and incised.  Utilizing the stereotactic system, pilot holes were drilled, tapped to 4.5 mm at L2 and 5.5 mm at L3.  The pilot holes were checked with the sound and found to have good circumferential bone.  5.5 millimeter screws were placed at L2 bilaterally and 6.5 millimeter screws were placed at L3.  At this point attention was turned to the lateral portion of the procedure.  Again utilizing the stereotactic system, a oblique incision was infiltrated with local and made just underneath the 12th rib to allow access to the L2-3 disc space.  Subcutaneous tissue was dissected with the Bovie.  The oblique musculature was then bluntly divided.  The transversalis fascia was identified and pierced.  The retroperitoneal space was then identified and blunt finger dissection was carried out.  I was able to palpate the surface of the psoas muscle and the L3 transverse process.  The initial dilator with K wire was then placed on the surface of the psoas.  Utilizing intraoperative electrophysiologic monitoring, the L2-3 disc space was docked with a small dilator and the K wire was inserted.  Position was checked with fluoroscopy.  Sequential dilators were then used to place a retractor, utilizing the intraoperative electrophysiologic monitoring which indicated a stimulation threshold for the lumbar plexus greater  than 20 mA for all dilators.  Once the retractor was placed, I was able to identify a small amount of psoas muscle on the surface of the disc space.  The direct stimulation was used and the lumbar plexus was stimulated at a threshold of greater than 20 mA.  A posterior shim was placed.  I then opened the retractor anteriorly.  I was then able to bluntly dissect the psoas muscle anteriorly and placed another shim anteriorly.  At this point the lateral annulus was incised with a scalpel.  Curettes and rongeur's were used to initiate the discectomy.  Cobb elevators were used on the L2 and L3 endplates and to release the contralateral annulus.  Sequential trials were then used to select a 10 mm x 50 mm lordotic cage.  Rasps were used to prepare the endplates for interbody arthrodesis.  A static cage was then packed with morselized bone allograft mixed with BMP.  This was then tapped into place under live AP and lateral fluoroscopy.  Once the cage was in place, the shins were removed, the retractor was close down, and removed under direct vision with no active bleeding identified.  At this point a pre-bent lordotic rod was sized to 45 mm, placed within the L2 and L3 pedicle screws bilaterally, and setscrews were placed and final tightened.  Final AP and lateral fluoroscopic images were taken revealing good coronal and sagittal alignment, and excellent placement of the implanted hardware.  At this point the posterior and flank wounds were closed in standard fashion using a combination of interrupted 0 Vicryl stitches.  Skin was closed with staples.  Bacitracin  ointment and sterile dressing was applied.  The Schanz pin was removed from the PSIS stab incision and also closed.  At the end of the case all sponge, needle, and instrument counts were correct. The patient was then transferred to the stretcher, extubated, and taken to the post-anesthesia care unit in stable hemodynamic condition.   Gerldine Maizes,  MD [facility]

## 2024-06-16 ENCOUNTER — Encounter (HOSPITAL_COMMUNITY): Payer: Self-pay | Admitting: Neurosurgery

## 2024-06-19 ENCOUNTER — Encounter (INDEPENDENT_AMBULATORY_CARE_PROVIDER_SITE_OTHER): Payer: Self-pay

## 2024-06-26 ENCOUNTER — Other Ambulatory Visit: Payer: Self-pay

## 2024-06-26 ENCOUNTER — Other Ambulatory Visit (HOSPITAL_COMMUNITY): Payer: Self-pay

## 2024-07-03 ENCOUNTER — Encounter: Payer: Self-pay | Admitting: Gastroenterology

## 2024-07-07 ENCOUNTER — Other Ambulatory Visit: Payer: Self-pay

## 2024-07-08 ENCOUNTER — Ambulatory Visit (INDEPENDENT_AMBULATORY_CARE_PROVIDER_SITE_OTHER)

## 2024-07-08 ENCOUNTER — Other Ambulatory Visit: Payer: Self-pay

## 2024-07-08 DIAGNOSIS — M5416 Radiculopathy, lumbar region: Secondary | ICD-10-CM | POA: Diagnosis not present

## 2024-07-08 DIAGNOSIS — Z23 Encounter for immunization: Secondary | ICD-10-CM | POA: Diagnosis not present

## 2024-07-08 NOTE — Progress Notes (Signed)
 Patient is in office today for a nurse visit for Immunization. Patient He tolerated injection well.

## 2024-07-10 ENCOUNTER — Other Ambulatory Visit: Payer: Self-pay

## 2024-07-21 ENCOUNTER — Ambulatory Visit (INDEPENDENT_AMBULATORY_CARE_PROVIDER_SITE_OTHER): Payer: Self-pay | Admitting: Psychiatry

## 2024-07-21 DIAGNOSIS — Z91199 Patient's noncompliance with other medical treatment and regimen due to unspecified reason: Secondary | ICD-10-CM

## 2024-07-21 NOTE — Progress Notes (Signed)
 No show

## 2024-07-28 ENCOUNTER — Other Ambulatory Visit: Payer: Self-pay | Admitting: Physician Assistant

## 2024-07-28 DIAGNOSIS — R112 Nausea with vomiting, unspecified: Secondary | ICD-10-CM

## 2024-07-28 MED ORDER — ONDANSETRON HCL 4 MG PO TABS: 4.0000 mg | ORAL_TABLET | Freq: Three times a day (TID) | ORAL | 0 refills | Status: DC | PRN

## 2024-07-29 ENCOUNTER — Telehealth (INDEPENDENT_AMBULATORY_CARE_PROVIDER_SITE_OTHER): Payer: Self-pay | Admitting: Otolaryngology

## 2024-07-29 NOTE — Telephone Encounter (Signed)
 The patient's wife called in, she wanted to see if Dr Tobie wanted to wait on the follow up appointment until after the patient has his first visit with PT.  IF that is so, they will need to move the current followup out.  Please advise.

## 2024-07-30 ENCOUNTER — Institutional Professional Consult (permissible substitution) (INDEPENDENT_AMBULATORY_CARE_PROVIDER_SITE_OTHER): Admitting: Otolaryngology

## 2024-07-30 ENCOUNTER — Other Ambulatory Visit (HOSPITAL_COMMUNITY): Payer: Self-pay

## 2024-07-30 ENCOUNTER — Telehealth: Payer: Self-pay

## 2024-07-30 ENCOUNTER — Ambulatory Visit (INDEPENDENT_AMBULATORY_CARE_PROVIDER_SITE_OTHER): Admitting: Audiology

## 2024-07-30 DIAGNOSIS — H903 Sensorineural hearing loss, bilateral: Secondary | ICD-10-CM

## 2024-07-30 DIAGNOSIS — R42 Dizziness and giddiness: Secondary | ICD-10-CM

## 2024-07-30 NOTE — Telephone Encounter (Signed)
 Prior Authorization form/request asks a question that requires your assistance. Please see the question below and advise accordingly. The PA will not be submitted until the necessary information is received.

## 2024-07-30 NOTE — Progress Notes (Addendum)
  [address] [place], [place] [address] [phone]  Audiological Evaluation    Name: Jon Gonzales     DOB:   [birth year]      MRN:   [id number]                                                                                     Service Date: 07/30/2024     Accompanied by: wife (to discuss results)    Patient comes today after Dr. Tobie, ENT sent a referral for a hearing evaluation due to concerns with tinnitus.   Symptoms Yes Details  Hearing loss  []    Tinnitus  [x]  Both ears, reports it gets louder with vertigo spells.  Ear pain/ infections/pressure  []    Balance problems  [x]  Vertigo spell that last anywhere from 2 hours to 2 days. Unknown triggers. May feel lightheaded before vertigo. Reports its onset was one year ago.  Patient reports is scheduled for vestibular evaluation at [facility].  Noise exposure history  []    Previous ear surgeries  []    Family history of hearing loss  []    Amplification  []    Other  []      Otoscopy: Right ear: Clear external ear canal and notable landmarks visualized on the tympanic membrane. Left ear:  Clear external ear canal and notable landmarks visualized on the tympanic membrane.  Tympanometry: Right ear: Type As- Normal external ear canal volume with normal middle ear pressure and low tympanic membrane compliance. Left ear: Type A- Normal external ear canal volume with normal middle ear pressure and tympanic membrane compliance.  Pure tone Audiometry: Both ears - Normal hearing from [phone] Hz, then mild to moderate sensorineural hearing loss from 4000 Hz - 8000 Hz.  Speech Audiometry: Right ear- Speech Reception Threshold (SRT) was obtained at 10 dBHL. Left ear-Speech Reception Threshold (SRT) was obtained at 5 dBHL.   Word Recognition Score Tested using NU-6 (recorded) Right ear: 100% was obtained at a presentation level of 60 dBHL with contralateral masking which is deemed as  excellent. Left ear: 100% was  obtained at a presentation level of 60 dBHL with contralateral masking which is deemed as  excellent.   The hearing test results were completed under headphones and re-checked with inserts and results are deemed to be of good reliability. Test technique:  conventional    Impression: There is not a significant difference in pure-tone thresholds between ears. There is not a significant difference in the word recognition score in between ears.    Recommendations: Follow up with ENT as scheduled for today. Return for a hearing evaluation at least in 2-3 years, before if concerns with hearing or tinnitus changes arise or per MD recommendation. Consider various tinnitus strategies, including the use of a sound generator, possibly hearing aids, and/or tinnitus retraining therapy - if it becomes bothersome.   Zaul Hubers MARIE LEROUX-MARTINEZ, AUD

## 2024-08-03 ENCOUNTER — Other Ambulatory Visit: Payer: Self-pay | Admitting: Physician Assistant

## 2024-08-03 ENCOUNTER — Other Ambulatory Visit (HOSPITAL_COMMUNITY): Payer: Self-pay

## 2024-08-03 ENCOUNTER — Other Ambulatory Visit: Payer: Self-pay | Admitting: Cardiology

## 2024-08-03 ENCOUNTER — Other Ambulatory Visit: Payer: Self-pay

## 2024-08-03 DIAGNOSIS — K219 Gastro-esophageal reflux disease without esophagitis: Secondary | ICD-10-CM

## 2024-08-03 MED ORDER — PANTOPRAZOLE SODIUM 40 MG PO TBEC
40.0000 mg | DELAYED_RELEASE_TABLET | Freq: Every day | ORAL | 1 refills | Status: DC
  Filled 2024-08-03 – 2024-08-10 (×2): qty 30, 30d supply, fill #0
  Filled 2024-09-01 – 2024-09-07 (×2): qty 30, 30d supply, fill #1

## 2024-08-03 MED ORDER — GABAPENTIN 300 MG PO CAPS
300.0000 mg | ORAL_CAPSULE | Freq: Every evening | ORAL | 3 refills | Status: DC
  Filled 2024-08-03 – 2024-08-10 (×4): qty 30, 30d supply, fill #0

## 2024-08-03 MED ORDER — ROSUVASTATIN CALCIUM 40 MG PO TABS
40.0000 mg | ORAL_TABLET | Freq: Every day | ORAL | 1 refills | Status: AC
  Filled 2024-08-03 – 2024-08-10 (×2): qty 30, 30d supply, fill #0
  Filled 2024-09-01 – 2024-09-07 (×2): qty 30, 30d supply, fill #1
  Filled 2024-10-01: qty 30, 30d supply, fill #2
  Filled 2024-11-03: qty 30, 30d supply, fill #3
  Filled ????-??-??: fill #2

## 2024-08-03 MED ORDER — CLOPIDOGREL BISULFATE 75 MG PO TABS
75.0000 mg | ORAL_TABLET | Freq: Every day | ORAL | 1 refills | Status: AC
  Filled 2024-08-03 – 2024-08-10 (×2): qty 30, 30d supply, fill #0
  Filled 2024-09-01 – 2024-09-07 (×2): qty 30, 30d supply, fill #1
  Filled 2024-10-01 – 2024-10-05 (×2): qty 30, 30d supply, fill #2
  Filled 2024-11-03: qty 30, 30d supply, fill #3

## 2024-08-04 ENCOUNTER — Other Ambulatory Visit: Payer: Self-pay

## 2024-08-04 NOTE — Telephone Encounter (Signed)
 Previous ESS 12/24, now 1/24 which indicates >25% improvement.

## 2024-08-05 ENCOUNTER — Encounter: Payer: Self-pay | Admitting: Physician Assistant

## 2024-08-05 ENCOUNTER — Ambulatory Visit: Admitting: Physician Assistant

## 2024-08-05 ENCOUNTER — Other Ambulatory Visit (HOSPITAL_BASED_OUTPATIENT_CLINIC_OR_DEPARTMENT_OTHER): Payer: Self-pay

## 2024-08-05 ENCOUNTER — Other Ambulatory Visit: Payer: Self-pay

## 2024-08-05 VITALS — BP 96/68 | HR 75 | Temp 98.3°F | Resp 18 | Ht 70.0 in | Wt 189.0 lb

## 2024-08-05 DIAGNOSIS — G43809 Other migraine, not intractable, without status migrainosus: Secondary | ICD-10-CM | POA: Diagnosis not present

## 2024-08-05 DIAGNOSIS — I9589 Other hypotension: Secondary | ICD-10-CM | POA: Diagnosis not present

## 2024-08-05 MED ORDER — QULIPTA 60 MG PO TABS: 60.0000 mg | ORAL_TABLET | Freq: Every day | ORAL | Status: DC

## 2024-08-05 MED ORDER — UBRELVY 100 MG PO TABS: ORAL_TABLET | ORAL | 1 refills | Status: DC

## 2024-08-05 NOTE — Progress Notes (Signed)
 Subjective:  Patient ID: Jon Gonzales, male    DOB: [birth year]  Age: 63 y.o. MRN: [id number]  Chief Complaint  Patient presents with   Dizziness    HPI Pt in today to discuss his dizziness and syncopal spells.  He has had an extensive cardiac and neurologic workup which has been negative.  Given his history it had been thought to be Menieres disease however since his symptoms are bilateral that is not conducive to Menieres and his hearing test recently done did not correlate with Menieres.  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  He had been given samples of ubrelvy 100mg  to take a few weeks ago and he states overall after taking medication generally his symptoms completely resolve in - 1hour.  He did have one episode last weekend that the medication did not help.  He has these spells at least once to twice weekly and at times perhaps more.  He becomes totally incapacitated with symptoms of dizziness, weakness, fatigue, etc Pt does have follow up appt with Dr Gailen on 09/23/24  Of note pt bp today significantly low at 96/68.  He denies chest pain, dyspnea, palpitations.  Upon reviewing his medications he is taking hydrochlorothiazide  12.5mg  qd - this was started thinking it would help his symptoms if it were actually Menieres.  Given that it is not and he is hypotensive recommend to stop medication     03/18/2024   11:19 AM 02/28/2024   11:31 AM 06/11/2023    3:30 PM 04/18/2022   10:08 PM 01/17/2022    4:14 PM  Depression screen PHQ 2/9  Decreased Interest 3 2 1 3 3   Down, Depressed, Hopeless 3 1 2 3 2   PHQ - 2 Score 6 3 3 6 5   Altered sleeping 2 1 0 3 3  Tired, decreased energy 1 1 0 3 1  Change in appetite 0 0 0 2 1  Feeling bad or failure about yourself  3 1 1 3 3   Trouble concentrating 3 2 3 2 2   Moving slowly or fidgety/restless 1 1 0 0 0  Suicidal thoughts 1 1 2 1 1   PHQ-9 Score 17 10 9 20 16   Difficult doing work/chores Very  difficult Somewhat difficult Somewhat difficult Somewhat difficult         08/28/2022    1:33 PM 09/18/2022    9:35 AM 06/11/2023    3:30 PM 09/20/2023    1:31 PM 03/18/2024   11:18 AM  Fall Risk  Falls in the past year? 0 0 1 0 1  Was there an injury with Fall? 0 0 0 0 0  Fall Risk Category Calculator 0 0 1 0 2  Fall Risk Category (Retired) Low  Low      (RETIRED) Patient Fall Risk Level Low fall risk  Low fall risk      Patient at Risk for Falls Due to  No Fall Risks No Fall Risks  History of fall(s);No Fall Risks  Fall risk Follow up Falls evaluation completed  Falls evaluation completed  Falls evaluation completed  Falls evaluation completed     Data saved with a previous flowsheet row definition     ROS CONSTITUTIONAL: Negative for chills, fatigue, fever,  E/N/T: Negative for ear pain, nasal congestion and sore throat.  CARDIOVASCULAR: Negative for chest pain, dizziness, palpitations and pedal edema.  RESPIRATORY: Negative for recent cough and dyspnea.  GASTROINTESTINAL: Negative for abdominal pain, acid  reflux symptoms, constipation, diarrhea, nausea and vomiting.  NEUROLOGICAL: see HPI PSYCHIATRIC: Negative for sleep disturbance and to question depression screen.  Negative for depression, negative for anhedonia.    Current Outpatient Medications:    acetaminophen  (TYLENOL ) 500 MG tablet, Take 500-1,000 mg by mouth every 6 (six) hours as needed for moderate pain (pain score 4-6)., Disp: , Rfl:    aspirin  EC (ASPIRIN  LOW DOSE) 81 MG tablet, Take 1 tablet (81 mg total) by mouth daily., Disp: 90 tablet, Rfl: 0   Atogepant (QULIPTA) 60 MG TABS, Take 1 tablet (60 mg total) by mouth daily., Disp: , Rfl:    cariprazine  (VRAYLAR ) 3 MG capsule, Take 1 capsule (3 mg total) by mouth daily., Disp: 90 capsule, Rfl: 0   clopidogrel  (PLAVIX ) 75 MG tablet, Take 1 tablet (75 mg total) by mouth daily., Disp: 90 tablet, Rfl: 1   DULoxetine  (CYMBALTA ) 60 MG capsule, Take 1 capsule (60 mg total) by  mouth 2 (two) times daily., Disp: 180 capsule, Rfl: 1   ezetimibe  (ZETIA ) 10 MG tablet, Take 1 tablet (10 mg total) by mouth daily., Disp: 90 tablet, Rfl: 1   fexofenadine  (ALLEGRA ) 180 MG tablet, Take 180 mg by mouth daily., Disp: , Rfl:    gabapentin  (NEURONTIN ) 300 MG capsule, Take 1 capsule (300 mg total) by mouth every evening., Disp: 30 capsule, Rfl: 3   isosorbide  mononitrate (IMDUR ) 60 MG 24 hr tablet, Take 2 tablets (120 mg total) by mouth daily., Disp: 180 tablet, Rfl: 1   LORazepam  (ATIVAN ) 1 MG tablet, Take 1 tablet (1 mg total) by mouth every 8 (eight) hours as needed for anxiety., Disp: 180 tablet, Rfl: 0   meclizine  (ANTIVERT ) 25 MG tablet, Take 1 tablet (25 mg total) by mouth 3 (three) times daily as needed for dizziness., Disp: 90 tablet, Rfl: 2   methocarbamol  (ROBAXIN ) 500 MG tablet, Take 1 tablet (500 mg total) by mouth every 6 (six) hours as needed for muscle spasms., Disp: 30 tablet, Rfl: 1   metoprolol  succinate (TOPROL -XL) 50 MG 24 hr tablet, Take 1 tablet by mouth daily. Take with or immediately following a meal., Disp: 90 tablet, Rfl: 1   Multiple Vitamin (MULTIVITAMIN WITH MINERALS) TABS tablet, Take 1 tablet by mouth daily. Men's Multivitamin, Disp: , Rfl:    nitroGLYCERIN  (NITROSTAT ) 0.4 MG SL tablet, Dissolve 1 tablet (0.4 mg total) under the tongue every 5 (five) minutes up to 3 doses as needed for chest pain. If no relief after 3 doses call 911., Disp: 25 tablet, Rfl: 1   ondansetron  (ZOFRAN ) 4 MG tablet, Take 1 tablet (4 mg total) by mouth every 8 (eight) hours as needed for nausea or vomiting., Disp: 30 tablet, Rfl: 0   oxyCODONE -acetaminophen  (PERCOCET) 5-325 MG tablet, Take 1-2 tablets by mouth every 4 (four) hours as needed., Disp: 48 tablet, Rfl: 0   pantoprazole  (PROTONIX ) 40 MG tablet, Take 1 tablet (40 mg total) by mouth daily., Disp: 90 tablet, Rfl: 1   ranolazine  (RANEXA ) 1000 MG SR tablet, Take 1 tablet by mouth 2 times daily., Disp: 180 tablet, Rfl: 3    rosuvastatin  (CRESTOR ) 40 MG tablet, Take 1 tablet (40 mg total) by mouth daily., Disp: 90 tablet, Rfl: 1   Solriamfetol  HCl (SUNOSI ) 150 MG TABS, Take 1 tablet (150 mg total) by mouth every morning., Disp: 90 tablet, Rfl: 1   Ubrogepant (UBRELVY) 100 MG TABS, 1 po at onset of dizziness/headache.  May repeat once in 2 hours.  Max dose 200mg  qd,  Disp: 30 tablet, Rfl: 1   Semaglutide -Weight Management (WEGOVY ) 2.4 MG/0.75ML SOAJ, Inject 2.4 mg into the skin once a week., Disp: 3 mL, Rfl: 1  Past Medical History:  Diagnosis Date   Abnormal EKG 06/13/2023   Abnormal stress test small area of mild ischemia inferior wall 05/24/2022   Aphasia 06/13/2023   Arthropathy of lumbar facet joint 07/03/2023   Bilateral sacroiliitis 08/29/2023   Bipolar 1 disorder 02/04/2023   Chronic bilateral low back pain with right-sided sciatica 06/24/2023   Chronic headaches 01/30/2021   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   Coronary artery disease involving coronary bypass graft of native heart with angina pectoris 05/22/2016   Cath 2013 LAD 95% stenosis, D1 90% stenosis, RCA 20%, Circ 30%. Previous stent to D1 2012. EF 60%.   Degeneration of lumbar intervertebral disc 07/03/2023   Dizziness 10/27/2020   Excessive daytime sleepiness 09/11/2016   Gastroesophageal reflux disease 02/04/2023   Generalized anxiety disorder 05/22/2016   Herpes zoster 01/20/2020   History of small bowel obstruction    Admitted 10-14-2007 for partical small bowel obstruction and N.G. tube was placed. Admitted by Dr Gerard. 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   Other fatigue 06/13/2023   Paronychia of finger, left 08/22/2020   PLMD (periodic limb movement disorder) 09/11/2016   PUD (peptic ulcer disease)    Syncope 06/13/2023   TIA (transient ischemic attack)    Tinnitus of both ears 06/26/2022   Tremor 02/04/2023   Objective:  PHYSICAL EXAM:   BP 96/68   Pulse 75   Temp 98.3 F (36.8 C) (Temporal)   Resp 18   Ht 5' 10 (1.778 m)   Wt 189 lb (85.7 kg)   SpO2 97%   BMI 27.12 kg/m    GEN: Well nourished, well developed, in no acute distress but appears tired Cardiac: RRR; no murmurs, rubs, or gallops,no edema -  Respiratory:  normal respiratory rate and pattern with no distress - normal breath sounds with no rales, rhonchi, wheezes or rubs  MS: no deformity or atrophy  Skin: warm and dry, no rash  Neuro:  Alert and Oriented x 3, - CN II-Xii grossly intact Psych: euthymic mood, appropriate affect and demeanor  Assessment & Plan:    Vestibular migraine -     Ubrelvy; 1 po at onset of dizziness/headache.  May repeat once in 2 hours.  Max dose 200mg  qd  Dispense: 30 tablet; Refill: 1 Trial qulipta 60mg  at bedtime Follow up with neurology as directed Other specified hypotension Stop hydrochlorothiazide  -   Follow-up: Return in about 2 weeks (around 08/19/2024) for nurse visit BP check.  An After Visit Summary was printed and given to the patient.  [facility] [phone]

## 2024-08-06 ENCOUNTER — Other Ambulatory Visit (HOSPITAL_COMMUNITY): Payer: Self-pay

## 2024-08-06 ENCOUNTER — Other Ambulatory Visit: Payer: Self-pay

## 2024-08-06 MED ORDER — ASPIRIN 81 MG PO TBEC
81.0000 mg | DELAYED_RELEASE_TABLET | Freq: Every day | ORAL | 2 refills | Status: AC
  Filled 2024-08-06 – 2024-08-10 (×2): qty 30, 30d supply, fill #0
  Filled 2024-09-01 – 2024-09-07 (×2): qty 30, 30d supply, fill #1
  Filled 2024-10-01 – 2024-10-05 (×2): qty 30, 30d supply, fill #2
  Filled 2024-11-03: qty 30, 30d supply, fill #3

## 2024-08-06 NOTE — Telephone Encounter (Signed)
 Pharmacy Patient Advocate Encounter  Received notification from [facility] that Prior Authorization for Sunosi  150MG  tablets has been APPROVED from 08-06-2024 to 08-05-2025   PA #/Case ID/Reference #: [id number]

## 2024-08-06 NOTE — Telephone Encounter (Signed)
 Pharmacy Patient Advocate Encounter   Received notification from Pt Calls Messages that prior authorization for Sunosi  150MG  tablet is required/requested.   Insurance verification completed.   The patient is insured through [facility].   Per test claim: PA required; PA submitted to above mentioned insurance via Latent Key/confirmation #/EOC [id number] Status is pending

## 2024-08-07 ENCOUNTER — Other Ambulatory Visit: Payer: Self-pay

## 2024-08-10 ENCOUNTER — Other Ambulatory Visit: Payer: Self-pay

## 2024-08-12 ENCOUNTER — Other Ambulatory Visit: Payer: Self-pay

## 2024-08-13 ENCOUNTER — Other Ambulatory Visit (HOSPITAL_COMMUNITY): Payer: Self-pay

## 2024-08-13 ENCOUNTER — Ambulatory Visit (INDEPENDENT_AMBULATORY_CARE_PROVIDER_SITE_OTHER): Admitting: Otolaryngology

## 2024-08-14 ENCOUNTER — Other Ambulatory Visit (HOSPITAL_BASED_OUTPATIENT_CLINIC_OR_DEPARTMENT_OTHER): Payer: Self-pay

## 2024-08-14 ENCOUNTER — Other Ambulatory Visit (HOSPITAL_COMMUNITY): Payer: Self-pay

## 2024-08-14 ENCOUNTER — Other Ambulatory Visit: Payer: Self-pay

## 2024-08-14 ENCOUNTER — Other Ambulatory Visit: Payer: Self-pay | Admitting: Physician Assistant

## 2024-08-14 DIAGNOSIS — G43809 Other migraine, not intractable, without status migrainosus: Secondary | ICD-10-CM

## 2024-08-14 MED ORDER — QULIPTA 60 MG PO TABS
60.0000 mg | ORAL_TABLET | Freq: Every day | ORAL | 1 refills | Status: DC
  Filled 2024-08-14 (×3): qty 90, 90d supply, fill #0

## 2024-08-18 DIAGNOSIS — Z7982 Long term (current) use of aspirin: Secondary | ICD-10-CM | POA: Diagnosis not present

## 2024-08-18 DIAGNOSIS — Z79899 Other long term (current) drug therapy: Secondary | ICD-10-CM | POA: Diagnosis not present

## 2024-08-18 DIAGNOSIS — R42 Dizziness and giddiness: Secondary | ICD-10-CM | POA: Diagnosis not present

## 2024-08-18 DIAGNOSIS — Z8673 Personal history of transient ischemic attack (TIA), and cerebral infarction without residual deficits: Secondary | ICD-10-CM | POA: Diagnosis not present

## 2024-08-24 ENCOUNTER — Encounter: Payer: Self-pay | Admitting: Physician Assistant

## 2024-08-24 ENCOUNTER — Ambulatory Visit (INDEPENDENT_AMBULATORY_CARE_PROVIDER_SITE_OTHER): Admitting: Physician Assistant

## 2024-08-24 VITALS — BP 106/78 | HR 76 | Temp 97.8°F | Resp 16 | Ht 70.0 in | Wt 170.4 lb

## 2024-08-24 DIAGNOSIS — I2510 Atherosclerotic heart disease of native coronary artery without angina pectoris: Secondary | ICD-10-CM

## 2024-08-24 DIAGNOSIS — R634 Abnormal weight loss: Secondary | ICD-10-CM

## 2024-08-24 DIAGNOSIS — R9431 Abnormal electrocardiogram [ECG] [EKG]: Secondary | ICD-10-CM | POA: Diagnosis not present

## 2024-08-24 DIAGNOSIS — R0602 Shortness of breath: Secondary | ICD-10-CM

## 2024-08-24 DIAGNOSIS — R5383 Other fatigue: Secondary | ICD-10-CM | POA: Diagnosis not present

## 2024-08-24 DIAGNOSIS — R739 Hyperglycemia, unspecified: Secondary | ICD-10-CM | POA: Diagnosis not present

## 2024-08-24 LAB — POCT URINALYSIS DIP (CLINITEK)
Blood, UA: NEGATIVE
Glucose, UA: NEGATIVE mg/dL
Ketones, POC UA: NEGATIVE mg/dL
Nitrite, UA: NEGATIVE
POC PROTEIN,UA: 30 — AB
Spec Grav, UA: >=1.03 — AB (ref 1.010–1.025)
Urobilinogen, UA: NEGATIVE U/dL — AB
pH, UA: 5.5 (ref 5.0–8.0)

## 2024-08-24 LAB — POCT GLYCOSYLATED HEMOGLOBIN (HGB A1C): HbA1c POC (<> result, manual entry): 5.2 % (ref 4.0–5.6)

## 2024-08-24 LAB — POCT LIPID PANEL
HDL: 27
TC: <100
TRG: 123

## 2024-08-24 LAB — POC COVID19 BINAXNOW: SARS Coronavirus 2 Ag: NEGATIVE

## 2024-08-24 LAB — POCT INFLUENZA A/B
Influenza A, POC: NEGATIVE
Influenza B, POC: NEGATIVE

## 2024-08-24 NOTE — Progress Notes (Addendum)
 Acute Office Visit  Subjective:    Patient ID: Jon Gonzales, male    DOB: Mar 09, 1961, 63 y.o.   MRN: [id number]  Chief Complaint  Patient presents with   Fatigue    HPI: Patient is in today for complaints of chronic fatigue.  He states he first noted it started about 3-4 weeks ago.  Stays tired all day despite getting plenty of sleep.  He goes to bed tired and wakes up tired.   Pt with history of CAD.  Denies chest pain or palpitations.  He has a follow up appt with cardiology in December.  Last stress test for patient was 6/23. He also had a normal cardiac cath 11/24.  He does state over the past few weeks he can be sitting/resting and feel like he cannot get a good deep breath and feels panicky for just a few seconds. After he is able to get deep breath he feels better.   Pt states overall has felt bloated and has not much of an appetite.  He states that he has been on wegovy  for about 3 months.  He has been taking pepto bismol and has noted black stools but no melena.  States urine is dark but no dysuria or urgency.  Denies vomiting.  He has actually had a weight loss of almost 20 pounds in less than a month (pt was supposed to have taken wegovy  dose recently but has stopped that medication now) Pt is scheduled later this week with GI for screening colonoscopy consult  Pt being treated for vestibular migraines with holland and qulipta - states overall those symptoms and spells of dizziness he had been having has improved  Current Outpatient Medications:    acetaminophen  (TYLENOL ) 500 MG tablet, Take 500-1,000 mg by mouth every 6 (six) hours as needed for moderate pain (pain score 4-6)., Disp: , Rfl:    aspirin  EC (ASPIRIN  LOW DOSE) 81 MG tablet, Take 1 tablet (81 mg total) by mouth daily., Disp: 90 tablet, Rfl: 2   Atogepant (QULIPTA) 60 MG TABS, Take 1 tablet (60 mg total) by mouth daily., Disp: 90 tablet, Rfl: 1   cariprazine  (VRAYLAR ) 3 MG capsule, Take 1 capsule (3 mg total) by mouth  daily., Disp: 90 capsule, Rfl: 0   clopidogrel  (PLAVIX ) 75 MG tablet, Take 1 tablet (75 mg total) by mouth daily., Disp: 90 tablet, Rfl: 1   DULoxetine  (CYMBALTA ) 60 MG capsule, Take 1 capsule (60 mg total) by mouth 2 (two) times daily., Disp: 180 capsule, Rfl: 1   ezetimibe  (ZETIA ) 10 MG tablet, Take 1 tablet (10 mg total) by mouth daily., Disp: 90 tablet, Rfl: 1   fexofenadine  (ALLEGRA ) 180 MG tablet, Take 180 mg by mouth daily., Disp: , Rfl:    isosorbide  mononitrate (IMDUR ) 60 MG 24 hr tablet, Take 2 tablets (120 mg total) by mouth daily., Disp: 180 tablet, Rfl: 1   LORazepam  (ATIVAN ) 1 MG tablet, Take 1 tablet (1 mg total) by mouth every 8 (eight) hours as needed for anxiety., Disp: 180 tablet, Rfl: 0   meclizine  (ANTIVERT ) 25 MG tablet, Take 1 tablet (25 mg total) by mouth 3 (three) times daily as needed for dizziness., Disp: 90 tablet, Rfl: 2   methocarbamol  (ROBAXIN ) 500 MG tablet, Take 1 tablet (500 mg total) by mouth every 6 (six) hours as needed for muscle spasms., Disp: 30 tablet, Rfl: 1   metoprolol  succinate (TOPROL -XL) 50 MG 24 hr tablet, Take 1 tablet by mouth daily. Take with or  immediately following a meal., Disp: 90 tablet, Rfl: 1   Multiple Vitamin (MULTIVITAMIN WITH MINERALS) TABS tablet, Take 1 tablet by mouth daily. Men's Multivitamin, Disp: , Rfl:    nitroGLYCERIN  (NITROSTAT ) 0.4 MG SL tablet, Dissolve 1 tablet (0.4 mg total) under the tongue every 5 (five) minutes up to 3 doses as needed for chest pain. If no relief after 3 doses call 911., Disp: 25 tablet, Rfl: 1   ondansetron  (ZOFRAN ) 4 MG tablet, Take 1 tablet (4 mg total) by mouth every 8 (eight) hours as needed for nausea or vomiting., Disp: 30 tablet, Rfl: 0   oxyCODONE -acetaminophen  (PERCOCET) 5-325 MG tablet, Take 1-2 tablets by mouth every 4 (four) hours as needed., Disp: 48 tablet, Rfl: 0   pantoprazole  (PROTONIX ) 40 MG tablet, Take 1 tablet (40 mg total) by mouth daily., Disp: 90 tablet, Rfl: 1   ranolazine  (RANEXA )  1000 MG SR tablet, Take 1 tablet by mouth 2 times daily., Disp: 180 tablet, Rfl: 3   rosuvastatin  (CRESTOR ) 40 MG tablet, Take 1 tablet (40 mg total) by mouth daily., Disp: 90 tablet, Rfl: 1   Solriamfetol  HCl (SUNOSI ) 150 MG TABS, Take 1 tablet (150 mg total) by mouth every morning., Disp: 90 tablet, Rfl: 1   Ubrogepant (UBRELVY) 100 MG TABS, 1 po at onset of dizziness/headache.  May repeat once in 2 hours.  Max dose 200mg  qd, Disp: 30 tablet, Rfl: 1  Allergies  Allergen Reactions   Trintellix [Vortioxetine] Other (See Comments)    Headaches.   Penicillins Rash    ROS CONSTITUTIONAL: see HPI E/N/T: Negative for ear pain, nasal congestion and sore throat.  CARDIOVASCULAR: Negative for chest pain, dizziness, palpitations and pedal edema.  RESPIRATORY: see HPI GASTROINTESTINAL: see HPI MSK: Negative for arthralgias and myalgias.  INTEGUMENTARY: Negative for rash.  NEUROLOGICAL: Negative for dizziness and headaches.  PSYCHIATRIC: Negative for sleep disturbance and to question depression screen.  Negative for depression, negative for anhedonia.      Objective:    PHYSICAL EXAM:   BP 106/78   Pulse 76   Temp 97.8 F (36.6 C)   Resp 16   Ht 5' 10 (1.778 m)   Wt 170 lb 6.4 oz (77.3 kg)   SpO2 94%   BMI 24.45 kg/m    GEN: Well nourished, well developed, in no acute distress  HEENT: normal external ears and nose - normal external auditory canals and TMS - hearing grossly normal - normal nasal mucosa and septum - Lips, Teeth and Gums - normal  Oropharynx - normal mucosa, palate, and posterior pharynx Neck: no JVD or masses - no thyromegaly Cardiac: RRR; no murmurs, rubs, or gallops,no edema - no significant varicosities Respiratory:  normal respiratory rate and pattern with no distress - normal breath sounds with no rales, rhonchi, wheezes or rubs GI: normal bowel sounds, no masses or tenderness MS: no deformity or atrophy  Skin: warm and dry, no rash  Neuro:  Alert and  Oriented x 3, Strength and sensation are intact - CN II-Xii grossly intact Psych: euthymic mood, appropriate affect and demeanor    EKG - new RBBB noted Office Visit on 08/24/2024  Component Date Value Ref Range Status   Glucose, UA 08/24/2024 negative  negative mg/dL Final   Bilirubin, UA 88/89/7974 small (A)  negative Final   Ketones, POC UA 08/24/2024 negative  negative mg/dL Final   Spec Grav, UA 88/89/7974 >=1.030 (A)  1.010 - 1.025 Final   Blood, UA 08/24/2024 negative  negative Final  pH, UA 08/24/2024 5.5  5.0 - 8.0 Final   POC PROTEIN,UA 08/24/2024 =30 (A)  negative, trace Final   Urobilinogen, UA 08/24/2024 negative (A)  0.2 or 1.0 E.U./dL Final   Nitrite, UA 88/89/7974 Negative  Negative Final   Leukocytes, UA 08/24/2024 Trace (A)  Negative Final   HbA1c POC (<> result, manual entry) 08/24/2024 5.2  4.0 - 5.6 % Final   TC 08/24/2024 <100   Final   HDL 08/24/2024 27   Final   TRG 08/24/2024 123   Final   LDL 08/24/2024 N/A   Final   Non-HDL 08/24/2024 N/A   Final   TC/HDL 08/24/2024 N/A   Final   SARS Coronavirus 2 Ag 08/24/2024 Negative  Negative Final   Influenza A, POC 08/24/2024 Negative  Negative Final   Influenza B, POC 08/24/2024 Negative  Negative Final    Office Visit on 08/24/2024  Component Date Value Ref Range Status   Glucose, UA 08/24/2024 negative  negative mg/dL Final   Bilirubin, UA 88/89/7974 small (A)  negative Final   Ketones, POC UA 08/24/2024 negative  negative mg/dL Final   Spec Grav, UA 88/89/7974 >=1.030 (A)  1.010 - 1.025 Final   Blood, UA 08/24/2024 negative  negative Final   pH, UA 08/24/2024 5.5  5.0 - 8.0 Final   POC PROTEIN,UA 08/24/2024 =30 (A)  negative, trace Final   Urobilinogen, UA 08/24/2024 negative (A)  0.2 or 1.0 E.U./dL Final   Nitrite, UA 88/89/7974 Negative  Negative Final   Leukocytes, UA 08/24/2024 Trace (A)  Negative Final   HbA1c POC (<> result, manual entry) 08/24/2024 5.2  4.0 - 5.6 % Final   TC 08/24/2024 <100    Final   HDL 08/24/2024 27   Final   TRG 08/24/2024 123   Final   LDL 08/24/2024 N/A   Final   Non-HDL 08/24/2024 N/A   Final   TC/HDL 08/24/2024 N/A   Final   SARS Coronavirus 2 Ag 08/24/2024 Negative  Negative Final   Influenza A, POC 08/24/2024 Negative  Negative Final   Influenza B, POC 08/24/2024 Negative  Negative Final    Assessment & Plan:    Other fatigue -     CBC with Differential/Platelet -     Comprehensive metabolic panel with GFR -     TSH -     B12 and Folate Panel -     VITAMIN D  25 Hydroxy (Vit-D Deficiency, Fractures) -     Methylmalonic acid, serum -     POCT URINALYSIS DIP (CLINITEK) -     POC COVID-19 BinaxNow -     POCT Influenza A/B  Shortness of breath -     DG Chest 2 View; Future -     EKG 12-Lead  Weight loss -     CBC with Differential/Platelet -     Comprehensive metabolic panel with GFR -     TSH -     B12 and Folate Panel -     VITAMIN D  25 Hydroxy (Vit-D Deficiency, Fractures) -     Methylmalonic acid, serum -     PSA Stay off wegovy  Follow up with GI as scheduled this week Atherosclerosis of native coronary artery of native heart without angina pectoris -     POCT Lipid Panel -     EKG 12-Lead  Hyperglycemia -     POCT glycosylated hemoglobin (Hb A1C)  Abnormal EKG   Will  reach out to cardiology regarding this finding and  his symptoms to see about getting sooner follow up appt  Follow-up: Return for schedule for wellness physical.  An After Visit Summary was printed and given to the patient.  CAMIE JONELLE NICHOLAUS DEVONNA [facility] [phone]

## 2024-08-27 ENCOUNTER — Encounter: Payer: Self-pay | Admitting: Gastroenterology

## 2024-08-27 ENCOUNTER — Telehealth: Payer: Self-pay

## 2024-08-27 ENCOUNTER — Ambulatory Visit: Admitting: Gastroenterology

## 2024-08-27 ENCOUNTER — Other Ambulatory Visit (HOSPITAL_BASED_OUTPATIENT_CLINIC_OR_DEPARTMENT_OTHER): Payer: Self-pay

## 2024-08-27 VITALS — BP 106/68 | HR 84 | Ht 70.0 in | Wt 177.0 lb

## 2024-08-27 DIAGNOSIS — R634 Abnormal weight loss: Secondary | ICD-10-CM | POA: Diagnosis not present

## 2024-08-27 DIAGNOSIS — Z1211 Encounter for screening for malignant neoplasm of colon: Secondary | ICD-10-CM | POA: Diagnosis not present

## 2024-08-27 DIAGNOSIS — R194 Change in bowel habit: Secondary | ICD-10-CM

## 2024-08-27 DIAGNOSIS — K219 Gastro-esophageal reflux disease without esophagitis: Secondary | ICD-10-CM

## 2024-08-27 DIAGNOSIS — R630 Anorexia: Secondary | ICD-10-CM | POA: Diagnosis not present

## 2024-08-27 MED ORDER — NA SULFATE-K SULFATE-MG SULF 17.5-3.13-1.6 GM/177ML PO SOLN
1.0000 | Freq: Once | ORAL | 0 refills | Status: AC
  Filled 2024-08-27: qty 354, 1d supply, fill #0

## 2024-08-27 NOTE — Progress Notes (Addendum)
 Chief Complaint:colon cancer screening Primary GI Doctor: Dr. Charlanne  HPI:  Patient is a  63  year old male patient with past medical history of GERD,CAD,Bipolar disorder, who was referred to me by Nicholaus Credit, PA-C on 02/28/24 for a evaluation of colon cancer screening .    05/29/24 follow-up with cardiology for preop evaluation: stable no recent events.  Interval History  Patient presents to discuss colon screening colonoscopy as well as discuss changes in appetite and weight loss over the course of the last few weeks. Accompanied by Dr. Abigail Free.  Patient reports he he just does not feel hungry. He has lost 10lbs in last week. Denies early satiety or fullness. Patient does have a lot of bloating.  Patient admits he does drink carbonated beverages.  Patient has history of GERD and taking Pantoprazole  40 mg po daily. Patient denies dysphagia.  He has BM every 1-2 days and reports its small amount.   He has been on Wegovy  for 2 years. He has held it week ago Monday.   He reports fatigue and has been sleeping longer periods of time.  He drinks 1-2 (12 oz)  beer a day.   Patient on Plavix  75mg  and ASA 81 mg.  Patient taking Naproxen and Goody powders prn for headaches.   Patients last colonoscopy was 10 years ago at [facility] and per patient was normal.  Patients last EGD was 10 years and had pyloric stenosis from NSAID use.  Patient's family history includes: MGF prostate CA  Patient is print production planner at [employer].  Wt Readings from Last 3 Encounters:  08/27/24 177 lb (80.3 kg)  08/24/24 170 lb 6.4 oz (77.3 kg)  08/05/24 189 lb (85.7 kg)    Past Medical History:  Diagnosis Date   Abnormal EKG 06/13/2023   Abnormal stress test small area of mild ischemia inferior wall 05/24/2022   Aphasia 06/13/2023   Arthropathy of lumbar facet joint 07/03/2023   Bilateral sacroiliitis 08/29/2023   Bipolar 1 disorder 02/04/2023   Chronic bilateral low back pain  with right-sided sciatica 06/24/2023   Chronic headaches 01/30/2021   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   Coronary artery disease involving coronary bypass graft of native heart with angina pectoris 05/22/2016   Cath 2013 LAD 95% stenosis, D1 90% stenosis, RCA 20%, Circ 30%. Previous stent to D1 2012. EF 60%.   Degeneration of lumbar intervertebral disc 07/03/2023   Dizziness 10/27/2020   Excessive daytime sleepiness 09/11/2016   Gastroesophageal reflux disease 02/04/2023   Generalized anxiety disorder 05/22/2016   Herpes zoster 01/20/2020   History of small bowel obstruction    Admitted 10-14-2007 for partical small bowel obstruction and N.G. tube was placed. Admitted by Dr Gerard. 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   Other fatigue 06/13/2023   Paronychia of finger, left 08/22/2020   PLMD (periodic limb movement disorder) 09/11/2016   PUD (peptic ulcer disease)  Syncope 06/13/2023   TIA (transient ischemic attack)    Tinnitus of both ears 06/26/2022   Tremor 02/04/2023    Past Surgical History:  Procedure Laterality Date   ANTERIOR LAT LUMBAR FUSION N/A 06/12/2024   Procedure: PRONE TRANSPOSOAS INTERBODY FUSION, LUMBAR TWO-LUMBAR THREE, POSTERIOR PERCUTANEOUS NON-SEGMENTAL  INSTRUMENTION, LUMBAR TWO-LUMBAR THREE;  Surgeon: Lanis Pupa, MD;  Location: [facility] OR;   Service: Neurosurgery;  Laterality: N/A;  Prone transpsoas interbody fusion, L2-3. Posterior percutaneous non-segmental instrumentation, L2-3   CARDIAC CATHETERIZATION  10/2011, 01/2020   CHOLECYSTECTOMY     COLONOSCOPY  09/10/2012   Mild sigmoid diverticulosis. Small internal hemorrhoids. Otherwise normal colonoscopy to terminal ileum   CORONARY ARTERY BYPASS GRAFT  02/19/2012   L - LAD, SVG - D1, SVG - OM   CORONARY STENT INTERVENTION N/A 11/05/2019   Procedure: CORONARY STENT INTERVENTION;  Surgeon: Court Dorn PARAS, MD;  Location: [facility] IN[facility];  Service: Cardiovascular;  Laterality: N/A;  SVG to DIAG   ESOPHAGOGASTRODUODENOSCOPY  01/08/2007   Multiple duodenal ulcers with stenosis/stricture of the distal first portion of the duodenum (measuring approximantely 9mm) Mild gastritis.   LEFT HEART CATH AND CORS/GRAFTS ANGIOGRAPHY N/A 11/05/2019   Procedure: LEFT HEART CATH AND CORS/GRAFTS ANGIOGRAPHY;  Surgeon: Court Dorn PARAS, MD;  Location: [facility];  Service: Cardiovascular;  Laterality: N/A;   LEFT HEART CATH AND CORS/GRAFTS ANGIOGRAPHY N/A 08/29/2023   Procedure: LEFT HEART CATH AND CORS/GRAFTS ANGIOGRAPHY;  Surgeon: Verlin Lonni BIRCH, MD;  Location: [facility];  Service: Cardiovascular;  Laterality: N/A;   LUMBAR LAMINECTOMY/DECOMPRESSION MICRODISCECTOMY Left 02/14/2024   Procedure: LUMBAR LAMINECTOMY/DECOMPRESSION MICRODISCECTOMY LEFT LUMBAR TWO-LUMBAR THREE;  Surgeon: Lanis Pupa, MD;  Location: [facility] OR;  Service: Neurosurgery;  Laterality: Left;  MICRODISCECTOMY, LT, L23   RENAL ANGIOGRAPHY N/A 11/05/2019   Procedure: RENAL ANGIOGRAPHY;  Surgeon: Court Dorn PARAS, MD;  Location: [facility];  Service: Cardiovascular;  Laterality: N/A;   TONSILLECTOMY AND ADENOIDECTOMY     WRIST SURGERY Right     Current Outpatient Medications  Medication Sig Dispense Refill   aspirin  EC (ASPIRIN  LOW DOSE) 81 MG tablet Take 1 tablet (81 mg total) by mouth  daily. 90 tablet 2   cariprazine  (VRAYLAR ) 3 MG capsule Take 1 capsule (3 mg total) by mouth daily. 90 capsule 0   clopidogrel  (PLAVIX ) 75 MG tablet Take 1 tablet (75 mg total) by mouth daily. 90 tablet 1   DULoxetine  (CYMBALTA ) 60 MG capsule Take 1 capsule (60 mg total) by mouth 2 (two) times daily. 180 capsule 1   ezetimibe  (ZETIA ) 10 MG tablet Take 1 tablet (10 mg total) by mouth daily. 90 tablet 1   fexofenadine  (ALLEGRA ) 180 MG tablet Take 180 mg by mouth daily.     isosorbide  mononitrate (IMDUR ) 60 MG 24 hr tablet Take 2 tablets (120 mg total) by mouth daily. 180 tablet 1   LORazepam  (ATIVAN ) 1 MG tablet Take 1 tablet (1 mg total) by mouth every 8 (eight) hours as needed for anxiety. 180 tablet 0   meclizine  (ANTIVERT ) 25 MG tablet Take 1 tablet (25 mg total) by mouth 3 (three) times daily as needed for dizziness. 90 tablet 2   metoprolol  succinate (TOPROL -XL) 50 MG 24 hr tablet Take 1 tablet by mouth daily. Take with or immediately following a meal. 90 tablet 1   Multiple Vitamin (MULTIVITAMIN WITH MINERALS) TABS tablet Take 1 tablet by mouth daily. Men's Multivitamin     Na Sulfate-K Sulfate-Mg Sulfate concentrate (SUPREP)  17.5-3.13-1.6 GM/177ML SOLN Take 1 kit (354 mLs total) by mouth once for 1 dose. 354 mL 0   nitroGLYCERIN  (NITROSTAT ) 0.4 MG SL tablet Dissolve 1 tablet (0.4 mg total) under the tongue every 5 (five) minutes up to 3 doses as needed for chest pain. If no relief after 3 doses call 911. 25 tablet 1   ondansetron  (ZOFRAN ) 4 MG tablet Take 1 tablet (4 mg total) by mouth every 8 (eight) hours as needed for nausea or vomiting. 30 tablet 0   pantoprazole  (PROTONIX ) 40 MG tablet Take 1 tablet (40 mg total) by mouth daily. 90 tablet 1   ranolazine  (RANEXA ) 1000 MG SR tablet Take 1 tablet by mouth 2 times daily. 180 tablet 3   rosuvastatin  (CRESTOR ) 40 MG tablet Take 1 tablet (40 mg total) by mouth daily. 90 tablet 1   semaglutide -weight management (WEGOVY ) 2.4 MG/0.75ML SOAJ SQ  injection Inject 2.4 mg into the skin.     Solriamfetol  HCl (SUNOSI ) 150 MG TABS Take 1 tablet (150 mg total) by mouth every morning. 90 tablet 1   Ubrogepant (UBRELVY) 100 MG TABS 1 po at onset of dizziness/headache.  [place] repeat once in 2 hours.  Max dose 200mg  qd 30 tablet 1   Atogepant (QULIPTA) 60 MG TABS Take 1 tablet (60 mg total) by mouth daily. (Patient not taking: Reported on 08/27/2024) 90 tablet 1   No current facility-administered medications for this visit.    Allergies as of 08/27/2024 - Review Complete 08/27/2024  Allergen Reaction Noted   Trintellix [vortioxetine] Other (See Comments) 10/30/2019   Penicillins Rash 11/09/2014    Family History  Problem Relation Age of Onset   Aneurysm Mother 26       cerebral   Early death Mother    CAD Father 26   Early death Father    Heart disease Sister        Unclear   Healthy Brother    Dementia Other        several individuals on extended paternal side of family    Review of Systems:    Constitutional: No weight loss, fever, chills, weakness or fatigue HEENT: Eyes: No change in vision               Ears, Nose, Throat:  No change in hearing or congestion Skin: No rash or itching Cardiovascular: No chest pain, chest pressure or palpitations   Respiratory: No SOB or cough Gastrointestinal: See HPI and otherwise negative Genitourinary: No dysuria or change in urinary frequency Neurological: No headache, dizziness or syncope Musculoskeletal: No new muscle or joint pain Hematologic: No bleeding or bruising Psychiatric: No history of depression or anxiety    Physical Exam:  Vital signs: BP 106/68   Pulse 84   Ht 5' 10 (1.778 m)   Wt 177 lb (80.3 kg)   SpO2 96%   BMI 25.40 kg/m   Constitutional:  Pleasant male appears to be in NAD, Well developed, Well nourished, alert and cooperative Throat: Oral cavity and pharynx without inflammation, swelling or lesion.  Respiratory: Respirations even and unlabored. Lungs  clear to auscultation bilaterally.   No wheezes, crackles, or rhonchi.  Cardiovascular: Normal S1, S2. Regular rate and rhythm. No peripheral edema, cyanosis or pallor.  Gastrointestinal:  Soft, nondistended, nontender. No rebound or guarding. Normal bowel sounds. No appreciable masses or hepatomegaly. Rectal:  Not performed.  Msk:  Symmetrical without gross deformities. Without edema, no deformity or joint abnormality.  Neurologic:  Alert and  oriented  x4;  grossly normal neurologically.  Skin:   Dry and intact without significant lesions or rashes.  RELEVANT LABS AND IMAGING: CBC    Latest Ref Rng & Units 06/09/2024    2:44 PM 05/30/2024    2:18 PM 05/30/2024    2:11 PM  CBC  WBC 4.0 - 10.5 K/uL 6.5   5.1   Hemoglobin 13.0 - 17.0 g/dL 86.7  86.3  85.7   Hematocrit 39.0 - 52.0 % 39.1  40.0  41.2   Platelets 150 - 400 K/uL 145   129      CMP     Latest Ref Rng & Units 06/09/2024    2:44 PM 05/30/2024    2:18 PM 05/30/2024    2:11 PM  CMP  Glucose 70 - 99 mg/dL 79  93  97   BUN 8 - 23 mg/dL 15  11  10    Creatinine 0.61 - 1.24 mg/dL 8.95  8.79  8.82   Sodium 135 - 145 mmol/L 136  142  140   Potassium 3.5 - 5.1 mmol/L 3.6  3.9  3.9   Chloride 98 - 111 mmol/L 102  104  106   CO2 22 - 32 mmol/L 26   25   Calcium  8.9 - 10.3 mg/dL 8.6   8.8   Total Protein 6.5 - 8.1 g/dL   6.7   Total Bilirubin 0.0 - 1.2 mg/dL   1.2   Alkaline Phos 38 - 126 U/L   38   AST 15 - 41 U/L   29   ALT 0 - 44 U/L   28      Lab Results  Component Value Date   TSH 0.665 03/03/2024   1/22 echo- Left ventricular ejection fraction, by estimation, is 60 to 65%.   08/2012 colonoscopy - overdue   Assessment: Encounter Diagnoses  Name Primary?   Gastroesophageal reflux disease, unspecified whether esophagitis present Yes   Altered bowel habits    Special screening for malignant neoplasms, colon    Loss of weight    Poor appetite      63 year old male patient due for colon screening colonoscopy, last  colonoscopy was approximately in 2013.  Will go ahead and schedule colonoscopy with 2-day prep in [facility] with Dr. Charlanne.    Patient also presents with poor appetite and weight loss of 10 pounds in the course of a week or 2.  Patient has been on Wegovy  for over 2 years however has not had significant weight loss like current.  Patient does have a history of GERD as well as pyloric stenosis that was found on endoscopy several years ago.  Will go ahead and schedule upper GI endoscopy to evaluate.  Recommend patient drink protein drinks as tolerated.   Plan: - Continue Pantoprazole  40mg  po daily -GERD diet, no late meals -Add OTC Miralax po daily  - Schedule EGD in [facility] with Dr. Charlanne.  The risks and benefits of EGD with possible biopsies and esophageal dilation were discussed with the patient who agrees to proceed. -Schedule for a colonoscopy with 2 day prep in [facility] with Dr. Charlanne. The risks and benefits of colonoscopy with possible polypectomy / biopsies were discussed and the patient agrees to proceed.  -Cardiac clearance for Plavix   - Hold Wegovy  for 1 week prior to procedures  ADDENDUM: Records from [facility] EGD on 01/08/2007 for epigastric pain Impression multiple duodenal ulcers with stenosis/stricture of the distal first portion of the duodenum Mild gastritis Biopsy Small  bowel biopsy Small bowel mucosa with architecturally normal villi Colonoscopy on 09/10/2012 Mild sigmoid diverticulosis Small internal hemorrhoids Otherwise normal colonoscopy   Thank you for the courtesy of this consult. Please call me with any questions or concerns.   Alezandra Egli, FNP-C [facility] 08/27/2024, 2:32 PM  Cc: Nicholaus Credit, PA-C

## 2024-08-27 NOTE — Patient Instructions (Signed)
 We have sent the following medications to your pharmacy for you to pick up at your convenience: SUPREP  You have been scheduled for an endoscopy and colonoscopy. Please follow the written instructions given to you at your visit today.  If you use inhalers (even only as needed), please bring them with you on the day of your procedure.  DO NOT TAKE 7 DAYS PRIOR TO TEST- Trulicity (dulaglutide) Ozempic , Wegovy  (semaglutide ) Mounjaro, Zepbound (tirzepatide) Bydureon Bcise (exanatide extended release)  DO NOT TAKE 1 DAY PRIOR TO YOUR TEST Rybelsus  (semaglutide ) Adlyxin (lixisenatide) Victoza (liraglutide) Byetta (exanatide) ___________________________________________________________________________  Due to recent changes in healthcare laws, you may see the results of your imaging and laboratory studies on MyChart before your provider has had a chance to review them.  We understand that in some cases there may be results that are confusing or concerning to you. Not all laboratory results come back in the same time frame and the provider may be waiting for multiple results in order to interpret others.  Please give us  48 hours in order for your provider to thoroughly review all the results before contacting the office for clarification of your results.   _______________________________________________________  If your blood pressure at your visit was 140/90 or greater, please contact your primary care physician to follow up on this.  _______________________________________________________  If you are age 62 or older, your body mass index should be between 23-30. Your Body mass index is 25.4 kg/m. If this is out of the aforementioned range listed, please consider follow up with your Primary Care Provider.  If you are age 29 or younger, your body mass index should be between 19-25. Your Body mass index is 25.4 kg/m. If this is out of the aformentioned range listed, please consider follow up  with your Primary Care Provider.   ________________________________________________________  The [facility] providers would like to encourage you to use MYCHART to communicate with providers for non-urgent requests or questions.  Due to long hold times on the telephone, sending your provider a message by [facility] may be a faster and more efficient way to get a response.  Please allow 48 business hours for a response.  Please remember that this is for non-urgent requests.  _______________________________________________________  [facility] is using a team-based approach to care.  Your team is made up of your doctor and two to three APPS. Our APPS (Nurse Practitioners and Physician Assistants) work with your physician to ensure care continuity for you. They are fully qualified to address your health concerns and develop a treatment plan. They communicate directly with your gastroenterologist to care for you. Seeing the Advanced Practice Practitioners on your physician's team can help you by facilitating care more promptly, often allowing for earlier appointments, access to diagnostic testing, procedures, and other specialty referrals.   Thank you for trusting me with your gastrointestinal care. Deanna May, FNP-C

## 2024-08-27 NOTE — Telephone Encounter (Signed)
 [facility] Pre-operative Risk Assessment     Request for surgical clearance:     Endoscopy Procedure  What type of surgery is being performed?     endoscopic  When is this surgery scheduled?     09/18/24  What type of clearance is required ?   Pharmacy  Are there any medications that need to be held prior to surgery and how long? Plavix  5 days  Practice name and name of physician performing surgery?      [facility]  What is your office phone and fax number?      Phone- [phone]  Fax- [phone]  Anesthesia type (None, local, MAC, general) ?       MAC   Please route your response to Karna Louder, CMA

## 2024-08-31 ENCOUNTER — Ambulatory Visit: Payer: Self-pay | Admitting: Physician Assistant

## 2024-08-31 LAB — CBC WITH DIFFERENTIAL/PLATELET
Basophils Absolute: 0 x10E3/uL (ref 0.0–0.2)
Basos: 1 %
EOS (ABSOLUTE): 0.2 x10E3/uL (ref 0.0–0.4)
Eos: 4 %
Hematocrit: 46.3 % (ref 37.5–51.0)
Hemoglobin: 15.5 g/dL (ref 13.0–17.7)
Immature Grans (Abs): 0.1 x10E3/uL (ref 0.0–0.1)
Immature Granulocytes: 2 %
Lymphocytes Absolute: 1.2 x10E3/uL (ref 0.7–3.1)
Lymphs: 21 %
MCH: 33.8 pg — ABNORMAL HIGH (ref 26.6–33.0)
MCHC: 33.5 g/dL (ref 31.5–35.7)
MCV: 101 fL — ABNORMAL HIGH (ref 79–97)
Monocytes Absolute: 0.4 x10E3/uL (ref 0.1–0.9)
Monocytes: 6 %
Neutrophils Absolute: 4 x10E3/uL (ref 1.4–7.0)
Neutrophils: 66 %
Platelets: 167 x10E3/uL (ref 150–450)
RBC: 4.59 x10E6/uL (ref 4.14–5.80)
RDW: 12.9 % (ref 11.6–15.4)
WBC: 6 x10E3/uL (ref 3.4–10.8)

## 2024-08-31 LAB — COMPREHENSIVE METABOLIC PANEL WITH GFR
ALT: 43 IU/L (ref 0–44)
AST: 27 IU/L (ref 0–40)
Albumin: 3.9 g/dL (ref 3.9–4.9)
Alkaline Phosphatase: 62 IU/L (ref 47–123)
BUN/Creatinine Ratio: 11 (ref 10–24)
BUN: 16 mg/dL (ref 8–27)
Bilirubin Total: 0.6 mg/dL (ref 0.0–1.2)
CO2: 20 mmol/L (ref 20–29)
Calcium: 9.4 mg/dL (ref 8.6–10.2)
Chloride: 103 mmol/L (ref 96–106)
Creatinine, Ser: 1.43 mg/dL — ABNORMAL HIGH (ref 0.76–1.27)
Globulin, Total: 2.5 g/dL (ref 1.5–4.5)
Glucose: 96 mg/dL (ref 70–99)
Potassium: 3.9 mmol/L (ref 3.5–5.2)
Sodium: 140 mmol/L (ref 134–144)
Total Protein: 6.4 g/dL (ref 6.0–8.5)
eGFR: 55 mL/min/1.73 — ABNORMAL LOW (ref >59)

## 2024-08-31 LAB — TSH: TSH: 1.93 u[IU]/mL (ref 0.450–4.500)

## 2024-08-31 LAB — METHYLMALONIC ACID, SERUM: Methylmalonic Acid: 150 nmol/L (ref 0–378)

## 2024-08-31 LAB — B12 AND FOLATE PANEL
Folate: 11.1 ng/mL (ref >3.0)
Vitamin B-12: 665 pg/mL (ref 232–1245)

## 2024-08-31 LAB — VITAMIN D 25 HYDROXY (VIT D DEFICIENCY, FRACTURES): Vit D, 25-Hydroxy: 47.9 ng/mL (ref 30.0–100.0)

## 2024-08-31 LAB — PSA: Prostate Specific Ag, Serum: 2.4 ng/mL (ref 0.0–4.0)

## 2024-09-01 ENCOUNTER — Other Ambulatory Visit: Payer: Self-pay | Admitting: Psychiatry

## 2024-09-01 ENCOUNTER — Other Ambulatory Visit: Payer: Self-pay

## 2024-09-01 ENCOUNTER — Other Ambulatory Visit: Payer: Self-pay | Admitting: Physician Assistant

## 2024-09-01 DIAGNOSIS — F314 Bipolar disorder, current episode depressed, severe, without psychotic features: Secondary | ICD-10-CM

## 2024-09-01 DIAGNOSIS — I2510 Atherosclerotic heart disease of native coronary artery without angina pectoris: Secondary | ICD-10-CM

## 2024-09-01 MED ORDER — CARIPRAZINE HCL 3 MG PO CAPS
3.0000 mg | ORAL_CAPSULE | Freq: Every day | ORAL | 0 refills | Status: AC
  Filled 2024-09-01 – 2024-09-07 (×2): qty 30, 30d supply, fill #0
  Filled 2024-10-01 – 2024-10-05 (×2): qty 30, 30d supply, fill #1
  Filled 2024-11-03: qty 30, 30d supply, fill #2

## 2024-09-01 MED ORDER — METOPROLOL SUCCINATE ER 50 MG PO TB24
50.0000 mg | ORAL_TABLET | Freq: Every day | ORAL | 1 refills | Status: DC
  Filled 2024-09-01 – 2024-09-07 (×2): qty 30, 30d supply, fill #0

## 2024-09-01 NOTE — Telephone Encounter (Signed)
Call RS

## 2024-09-02 ENCOUNTER — Other Ambulatory Visit: Payer: Self-pay

## 2024-09-03 ENCOUNTER — Ambulatory Visit (INDEPENDENT_AMBULATORY_CARE_PROVIDER_SITE_OTHER)
Admission: RE | Admit: 2024-09-03 | Discharge: 2024-09-03 | Disposition: A | Discharge status: Home / Self Care | Source: Ambulatory Visit | Attending: Physician Assistant | Admitting: Physician Assistant

## 2024-09-03 ENCOUNTER — Encounter (INDEPENDENT_AMBULATORY_CARE_PROVIDER_SITE_OTHER): Payer: Self-pay | Admitting: Otolaryngology

## 2024-09-03 ENCOUNTER — Other Ambulatory Visit: Payer: Self-pay

## 2024-09-03 ENCOUNTER — Ambulatory Visit (INDEPENDENT_AMBULATORY_CARE_PROVIDER_SITE_OTHER): Admitting: Otolaryngology

## 2024-09-03 VITALS — BP 106/70 | HR 74 | Ht 70.0 in | Wt 171.0 lb

## 2024-09-03 DIAGNOSIS — H938X3 Other specified disorders of ear, bilateral: Secondary | ICD-10-CM

## 2024-09-03 DIAGNOSIS — R0602 Shortness of breath: Secondary | ICD-10-CM

## 2024-09-03 DIAGNOSIS — R42 Dizziness and giddiness: Secondary | ICD-10-CM | POA: Diagnosis not present

## 2024-09-03 DIAGNOSIS — H903 Sensorineural hearing loss, bilateral: Secondary | ICD-10-CM

## 2024-09-04 ENCOUNTER — Other Ambulatory Visit (HOSPITAL_BASED_OUTPATIENT_CLINIC_OR_DEPARTMENT_OTHER): Payer: Self-pay

## 2024-09-04 ENCOUNTER — Other Ambulatory Visit: Payer: Self-pay

## 2024-09-04 MED ORDER — GABAPENTIN 300 MG PO CAPS
300.0000 mg | ORAL_CAPSULE | Freq: Every evening | ORAL | 3 refills | Status: DC
  Filled 2024-09-07: qty 30, 30d supply, fill #0

## 2024-09-06 NOTE — Progress Notes (Signed)
 Dear Dr. Nicholaus, Here is my assessment for our mutual patient, Jon Gonzales. Thank you for allowing me the opportunity to care for your patient. Please do not hesitate to contact me should you have any other questions. Sincerely, Dr. Eldora Blanch  Otolaryngology Clinic Note Referring provider: Dr. Nicholaus HPI:  Jon Gonzales is a 63 y.o. male kindly referred by Dr. Nicholaus for evaluation of vertigo  Initial visit (05/2024): Patient reports: episodic vertigo which has been ongoing for at least 2-3 years. Now having almost weekly or twice weekly attacks where each attack with last up to 6 hours, with symptoms including worsening ear fullness and tinnitus and nausea. Interestingly, he denies any hearing change (if anything hearing is more acute(?) during these episodes and sometimes does have other symptoms as well including some light sensitivity, bodily weakness, diplopia, and some tongue slurring as well. Wife has also noted some confusion/forgetfulness after these episodes as well. No facial weakness or numbness. Has intermittent headaches with this but not a primary features. No triggers or association with food. This is quite disabling. He reports that he has tried meclizine  and it helps somewhat. He has also tried nurtec recently and this may have helped(?) - no attacks over last week. He otherwise denies significant otologic sx.  Patient denies: ear pain, fullness, vertigo, drainage Patient additionally denies: deep pain in ear canal, eustachian tube symptoms such as popping, crackling, sensitive to pressure changes Patient also denies barotrauma, ototoxic medication use Prior ear surgery: no  He has had Neurology eval, multiple rounds of imaging - without definitive pathology or diagnosis. Most recent ED visit 05/2024.  He is on low dose hydrochlorothiazide  for query presumed Meniere's - cannot handle higher dose due to  hypotension  --------------------------------------------------------- 09/03/2024 Returns for follow up. He continues to have episodic vertigo associated with headache. He has tried migraine abortive medications which he does report help. Having about 3 episodes per week now. No ear pain, fullness, drainage. He has had audiogram as well as Formal vestibular testing at [facility].    Personal or FHx of bleeding dz or anesthesia difficulty: no   Tobacco: no  PMHx: HTN, CAD, OSA, BP 1  Independent Review of Additional Tests or Records:  ED visit 05/30/2024 Dr. 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, unchanged  Other CT and MR Brain/Head reviewed 10/03/2023: again no retrocochlear lesions appreciated, mastoid/ME/otic capsule without pathology noted  No recent audiogram noted; [facility] 06/26/2022 notes reviewed Neuro and PCP notes reviewed as well  Jon Gonzales (08/18/2024):     07/2024 Audiogram was independently reviewed and interpreted by me and it reveals - normal downsloping to mod/sev SNHL AU, symmetric; WRT 100% AU at 60dB HL; As/A tymps   SNHL= Sensorineural hearing loss  PMH/Meds/All/SocHx/FamHx/ROS:   Past Medical History:  Diagnosis Date   Abnormal EKG 06/13/2023   Abnormal stress test small area of mild ischemia inferior wall 05/24/2022   Aphasia 06/13/2023   Arthropathy of lumbar facet joint 07/03/2023   Bilateral sacroiliitis 08/29/2023   Bipolar 1 disorder 02/04/2023   Chronic bilateral low back pain with right-sided sciatica 06/24/2023   Chronic headaches 01/30/2021   Class 2 obesity due to excess calories without serious comorbidity  in adult 09/11/2016  Confusional arousals 09/11/2016   Coronary artery disease involving coronary bypass graft of native heart with angina pectoris 05/22/2016   Cath 2013 LAD 95% stenosis, D1 90% stenosis, RCA 20%, Circ 30%. Previous stent to D1 2012. EF 60%.   Degeneration of lumbar intervertebral disc 07/03/2023   Dizziness 10/27/2020   Excessive daytime sleepiness 09/11/2016   Gastroesophageal reflux disease 02/04/2023   Generalized anxiety disorder 05/22/2016   Herpes zoster 01/20/2020   History of small bowel obstruction    Admitted 10-14-2007 for partical small bowel obstruction and N.G. tube was placed. Admitted by Dr Gerard. 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   Other fatigue 06/13/2023   Paronychia of finger, left 08/22/2020   PLMD (periodic limb movement disorder) 09/11/2016   PUD (peptic ulcer disease)    Syncope 06/13/2023   TIA (transient ischemic attack)    Tinnitus of both ears 06/26/2022   Tremor 02/04/2023     Past Surgical History:  Procedure Laterality Date   ANTERIOR LAT LUMBAR FUSION N/A 06/12/2024   Procedure: PRONE TRANSPOSOAS INTERBODY FUSION, LUMBAR TWO-LUMBAR THREE, POSTERIOR PERCUTANEOUS NON-SEGMENTAL  INSTRUMENTION, LUMBAR TWO-LUMBAR THREE;  Surgeon: Lanis Pupa, MD;  Location: [facility] OR;  Service: Neurosurgery;  Laterality: N/A;  Prone transpsoas interbody fusion, L2-3. Posterior percutaneous non-segmental instrumentation, L2-3    CARDIAC CATHETERIZATION  10/2011, 01/2020   CHOLECYSTECTOMY     COLONOSCOPY  09/10/2012   Mild sigmoid diverticulosis. Small internal hemorrhoids. Otherwise normal colonoscopy to terminal ileum   CORONARY ARTERY BYPASS GRAFT  02/19/2012   L - LAD, SVG - D1, SVG - OM   CORONARY STENT INTERVENTION N/A 11/05/2019   Procedure: CORONARY STENT INTERVENTION;  Surgeon: Court Dorn PARAS, MD;  Location: [facility] INVASIVE CV LAB;  Service: Cardiovascular;  Laterality: N/A;  SVG to DIAG   ESOPHAGOGASTRODUODENOSCOPY  01/08/2007   Multiple duodenal ulcers with stenosis/stricture of the distal first portion of the duodenum (measuring approximantely 9mm) Mild gastritis.   LEFT HEART CATH AND CORS/GRAFTS ANGIOGRAPHY N/A 11/05/2019   Procedure: LEFT HEART CATH AND CORS/GRAFTS ANGIOGRAPHY;  Surgeon: Court Dorn PARAS, MD;  Location: [facility];  Service: Cardiovascular;  Laterality: N/A;   LEFT HEART CATH AND CORS/GRAFTS ANGIOGRAPHY N/A 08/29/2023   Procedure: LEFT HEART CATH AND CORS/GRAFTS ANGIOGRAPHY;  Surgeon: Verlin Lonni BIRCH, MD;  Location: [facility];  Service: Cardiovascular;  Laterality: N/A;   LUMBAR LAMINECTOMY/DECOMPRESSION MICRODISCECTOMY Left 02/14/2024   Procedure: LUMBAR LAMINECTOMY/DECOMPRESSION MICRODISCECTOMY LEFT LUMBAR TWO-LUMBAR THREE;  Surgeon: Lanis Pupa, MD;  Location: [facility] OR;  Service: Neurosurgery;  Laterality: Left;  MICRODISCECTOMY, LT, L23   RENAL ANGIOGRAPHY N/A 11/05/2019   Procedure: RENAL ANGIOGRAPHY;  Surgeon: Court Dorn PARAS, MD;  Location: [facility];  Service: Cardiovascular;  Laterality: N/A;   TONSILLECTOMY AND ADENOIDECTOMY     WRIST SURGERY Right     Family History  Problem Relation Age of Onset   Aneurysm Mother 19       cerebral   Early death Mother    CAD Father 69  Early death Father    Heart disease Sister        Unclear   Healthy Brother    Dementia Other        several individuals on extended paternal side of family      Social Connections: Moderately Integrated (06/04/2022)   Social Connection and Isolation Panel    Frequency of Communication with Friends and Family: More than three times a week    Frequency of Social Gatherings with Friends and Family: More than three times a week    Attends Religious Services: Never    Database Administrator or Organizations: Yes    Attends Engineer, Structural: More than 4 times per year    Marital Status: Married      Current Outpatient Medications:    aspirin  EC (ASPIRIN  LOW DOSE) 81 MG tablet, Take 1 tablet (81 mg total) by mouth daily., Disp: 90 tablet, Rfl: 2   cariprazine  (VRAYLAR ) 3 MG capsule, Take 1 capsule (3 mg total) by mouth daily., Disp: 90 capsule, Rfl: 0   clopidogrel  (PLAVIX ) 75 MG tablet, Take 1 tablet (75 mg total) by mouth daily., Disp: 90 tablet, Rfl: 1   DULoxetine  (CYMBALTA ) 60 MG capsule, Take 1 capsule (60 mg total) by mouth 2 (two) times daily., Disp: 180 capsule, Rfl: 1   ezetimibe  (ZETIA ) 10 MG tablet, Take 1 tablet (10 mg total) by mouth daily., Disp: 90 tablet, Rfl: 1   fexofenadine  (ALLEGRA ) 180 MG tablet, Take 180 mg by mouth daily., Disp: , Rfl:    isosorbide  mononitrate (IMDUR ) 60 MG 24 hr tablet, Take 2 tablets (120 mg total) by mouth daily., Disp: 180 tablet, Rfl: 1   LORazepam  (ATIVAN ) 1 MG tablet, Take 1 tablet (1 mg total) by mouth every 8 (eight) hours as needed for anxiety., Disp: 180 tablet, Rfl: 0   meclizine  (ANTIVERT ) 25 MG tablet, Take 1 tablet (25 mg total) by mouth 3 (three) times daily as needed for dizziness., Disp: 90 tablet, Rfl: 2   metoprolol  succinate (TOPROL -XL) 50 MG 24 hr tablet, Take 1 tablet by mouth daily. Take with or immediately following a meal., Disp: 90 tablet, Rfl: 1   Multiple Vitamin (MULTIVITAMIN WITH MINERALS) TABS tablet, Take 1 tablet by mouth daily. Men's Multivitamin, Disp: , Rfl:    nitroGLYCERIN  (NITROSTAT ) 0.4 MG SL tablet, Dissolve 1 tablet (0.4 mg total) under the tongue every 5  (five) minutes up to 3 doses as needed for chest pain. If no relief after 3 doses call 911., Disp: 25 tablet, Rfl: 1   ondansetron  (ZOFRAN ) 4 MG tablet, Take 1 tablet (4 mg total) by mouth every 8 (eight) hours as needed for nausea or vomiting., Disp: 30 tablet, Rfl: 0   pantoprazole  (PROTONIX ) 40 MG tablet, Take 1 tablet (40 mg total) by mouth daily., Disp: 90 tablet, Rfl: 1   ranolazine  (RANEXA ) 1000 MG SR tablet, Take 1 tablet by mouth 2 times daily., Disp: 180 tablet, Rfl: 3   rosuvastatin  (CRESTOR ) 40 MG tablet, Take 1 tablet (40 mg total) by mouth daily., Disp: 90 tablet, Rfl: 1   semaglutide -weight management (WEGOVY ) 2.4 MG/0.75ML SOAJ SQ injection, Inject 2.4 mg into the skin., Disp: , Rfl:    Solriamfetol  HCl (SUNOSI ) 150 MG TABS, Take 1 tablet (150 mg total) by mouth every morning., Disp: 90 tablet, Rfl: 1   Ubrogepant  (UBRELVY ) 100 MG TABS, 1 po at onset of dizziness/headache.  May repeat once in 2 hours.  Max dose 200mg  qd, Disp:  30 tablet, Rfl: 1   Atogepant  (QULIPTA ) 60 MG TABS, Take 1 tablet (60 mg total) by mouth daily. (Patient not taking: Reported on 09/03/2024), Disp: 90 tablet, Rfl: 1   gabapentin  (NEURONTIN ) 300 MG capsule, Take 1 capsule (300 mg total) by mouth every evening., Disp: 30 capsule, Rfl: 3   Physical Exam:   BP 106/70 (BP Location: Right Arm, Patient Position: Sitting, Cuff Size: Large)   Pulse 74   Ht 5' 10 (1.778 m)   Wt 171 lb (77.6 kg)   SpO2 96%   BMI 24.54 kg/m   Salient findings:  CN II-XII intact Bilateral EAC clear and TM intact with well pneumatized middle ear spaces Gait normal, no nystagmus grossly  Seprately Identifiable Procedures:  Prior to initiating any procedures, risks/benefits/alternatives were explained to the patient and verbal consent obtained. None  Impression & Plans:  Jon Gonzales is a 63 y.o. male with:  1. Sensorineural hearing loss, bilateral   2. Sensation of fullness in both ears   3. Vertigo     Has had multiple  rounds of imaging and is on low dose hydrochlorothiazide  with persistent sx. Migraine medications have helped and he has now had an extensive workup including MRI, audiometric testing and formal vestibular testing which have not shown any peripheral cause for his vertigo.  As such, given above, suspect that the primary etiology of his symptoms appears to be vestibular migraine. If hearing declines acutely, can consider audiogram but think would be most useful to discuss w/Neurology and treat vestibular migraine currently  - f/u with Neuro (already has seen prior). Will f/u as needed with me - For hearing loss, will observe for now. If declines, consider amplificaiton  See below regarding exact medications prescribed this encounter including dosages and route: No orders of the defined types were placed in this encounter.     Thank you for allowing me the opportunity to care for your patient. Please do not hesitate to contact me should you have any other questions.  Sincerely, Eldora Blanch, MD Otolaryngologist (ENT), [facility] Phone: [phone] Fax: [phone]  09/06/2024, 12:10 PM   I have personally spent 31 minutes involved in face-to-face and non-face-to-face activities for this patient on the day of the visit.  Professional time spent excludes any procedures performed but includes the following activities, in addition to those noted in the documentation: preparing to see the patient (review of outside documentation and results - vestibular testing), performing a medically appropriate examination, counseling, documenting in the electronic health record

## 2024-09-07 ENCOUNTER — Other Ambulatory Visit: Payer: Self-pay

## 2024-09-07 ENCOUNTER — Ambulatory Visit: Admitting: Physician Assistant

## 2024-09-07 ENCOUNTER — Encounter: Payer: Self-pay | Admitting: Physician Assistant

## 2024-09-07 ENCOUNTER — Other Ambulatory Visit (HOSPITAL_COMMUNITY): Payer: Self-pay

## 2024-09-07 VITALS — BP 90/60 | HR 72 | Temp 98.3°F | Resp 18 | Ht 70.0 in | Wt 181.1 lb

## 2024-09-07 DIAGNOSIS — I2510 Atherosclerotic heart disease of native coronary artery without angina pectoris: Secondary | ICD-10-CM

## 2024-09-07 DIAGNOSIS — R42 Dizziness and giddiness: Secondary | ICD-10-CM | POA: Diagnosis not present

## 2024-09-07 DIAGNOSIS — G43809 Other migraine, not intractable, without status migrainosus: Secondary | ICD-10-CM

## 2024-09-07 DIAGNOSIS — R5383 Other fatigue: Secondary | ICD-10-CM

## 2024-09-07 DIAGNOSIS — F411 Generalized anxiety disorder: Secondary | ICD-10-CM | POA: Diagnosis not present

## 2024-09-07 DIAGNOSIS — R2681 Unsteadiness on feet: Secondary | ICD-10-CM | POA: Diagnosis not present

## 2024-09-07 DIAGNOSIS — R0602 Shortness of breath: Secondary | ICD-10-CM

## 2024-09-07 DIAGNOSIS — G4719 Other hypersomnia: Secondary | ICD-10-CM

## 2024-09-07 MED ORDER — ISOSORBIDE MONONITRATE ER 60 MG PO TB24
60.0000 mg | ORAL_TABLET | Freq: Every day | ORAL | 2 refills | Status: AC
  Filled 2024-09-07: qty 30, 30d supply, fill #0
  Filled 2024-10-01: qty 30, 30d supply, fill #1
  Filled 2024-11-03: qty 30, 30d supply, fill #2
  Filled ????-??-??: fill #1

## 2024-09-07 MED ORDER — METOPROLOL SUCCINATE ER 25 MG PO TB24
25.0000 mg | ORAL_TABLET | Freq: Every day | ORAL | 2 refills | Status: AC
  Filled 2024-09-07: qty 30, 30d supply, fill #0
  Filled 2024-10-01 – 2024-10-05 (×2): qty 30, 30d supply, fill #1
  Filled 2024-11-03: qty 30, 30d supply, fill #2

## 2024-09-07 MED ORDER — METOPROLOL SUCCINATE ER 50 MG PO TB24: ORAL_TABLET | ORAL | Status: DC

## 2024-09-07 MED ORDER — QULIPTA 60 MG PO TABS: ORAL_TABLET | ORAL | Status: DC

## 2024-09-07 MED ORDER — ISOSORBIDE MONONITRATE ER 60 MG PO TB24
60.0000 mg | ORAL_TABLET | Freq: Every day | ORAL | Status: DC
Start: 2024-09-07 — End: 2024-09-07

## 2024-09-07 MED ORDER — ISOSORBIDE MONONITRATE ER 60 MG PO TB24
60.0000 mg | ORAL_TABLET | Freq: Every day | ORAL | 2 refills | Status: DC
Start: 2024-09-07 — End: 2024-09-07
  Filled 2024-09-07: qty 30, 30d supply, fill #0

## 2024-09-07 MED ORDER — METOPROLOL SUCCINATE ER 25 MG PO TB24
25.0000 mg | ORAL_TABLET | Freq: Every day | ORAL | 2 refills | Status: DC
  Filled 2024-09-07: qty 30, 30d supply, fill #0

## 2024-09-07 NOTE — Progress Notes (Signed)
 Subjective:  Patient ID: Jon Gonzales, male    DOB: January 21, 1961  Age: 63 y.o. MRN: [id number]  Chief Complaint  Patient presents with   Medical Management of Chronic Issues    HPI  Pt in today to discuss his symptoms that have been affecting his daily home and work functioning --- for the past 4-6 months patient has felt like he has not been able to focus, feels mentally confused at times (example being working on computer and then forgetting how and what to do ) not feeling stable on his feet due to gait instability.  He has experienced extreme fatigue and trouble with daytime somnolence.  He is also extremely stressed and anxious.  He has had a very thorough workup from neurology and has been diagnosed with vestibular migraines.  He will be seeing them for follow up on 09/23/24.  He also will be seeing cardiology on 12/16 for evaluation of his fatigue and has been having intermittent dyspnea as well.   He had been started on Qulipta  60mg  for preventive measures of dizziness and vertigo thought to be caused by vestibular migraines.  About that time is when most of extreme fatigue started and that medication was stopped thinking that could be causing it.  He noted no difference with level of fatigue after coming off medication.  He is still having spells of vertigo at least weekly Of note bp today is significantly low at 90/60 - upon reviewing his medications will adjust both his toprol  and imdur  -possibly the low blood pressure could be contributing to extreme fatigue as well Due to all of these medical issues it would benefit patient to take a leave from work since he is not able to meet the current demands of his position nor feel safe (mobility issues) while performing tasks at work Pt is also scheduled for EGD and colonscopy on 09/18/24.    03/18/2024   11:19 AM 02/28/2024   11:31 AM 06/11/2023    3:30 PM 04/18/2022   10:08 PM 01/17/2022    4:14 PM  Depression screen PHQ 2/9  Decreased Interest  3 2 1 3 3   Down, Depressed, Hopeless 3 1 2 3 2   PHQ - 2 Score 6 3 3 6 5   Altered sleeping 2 1 0 3 3  Tired, decreased energy 1 1 0 3 1  Change in appetite 0 0 0 2 1  Feeling bad or failure about yourself  3 1 1 3 3   Trouble concentrating 3 2 3 2 2   Moving slowly or fidgety/restless 1 1 0 0 0  Suicidal thoughts 1 1 2 1 1   PHQ-9 Score 17  10  9  20  16    Difficult doing work/chores Very difficult Somewhat difficult Somewhat difficult Somewhat difficult      Data saved with a previous flowsheet row definition        08/28/2022    1:33 PM 09/18/2022    9:35 AM 06/11/2023    3:30 PM 09/20/2023    1:31 PM 03/18/2024   11:18 AM  Fall Risk  Falls in the past year? 0 0 1 0 1  Was there an injury with Fall? 0 0 0 0 0  Fall Risk Category Calculator 0 0 1 0 2  Fall Risk Category (Retired) Low  Low      (RETIRED) Patient Fall Risk Level Low fall risk  Low fall risk      Patient at Risk for Falls Due to  No Fall Risks No Fall Risks  History of fall(s);No Fall Risks  Fall risk Follow up Falls evaluation completed  Falls evaluation completed  Falls evaluation completed  Falls evaluation completed     Data saved with a previous flowsheet row definition     ROS CONSTITUTIONAL: Negative for chills, fatigue, fever,  E/N/T: Negative for ear pain, nasal congestion and sore throat.  CARDIOVASCULAR: see HPI RESPIRATORY: Negative for recent cough and dyspnea.  GASTROINTESTINAL: Negative for abdominal pain, acid reflux symptoms, constipation, diarrhea, nausea and vomiting.  MSK: Negative for arthralgias and myalgias.  INTEGUMENTARY: Negative for rash.  NEUROLOGICAL: see HPI PSYCHIATRIC: see HPI   Current Outpatient Medications:    aspirin  EC (ASPIRIN  LOW DOSE) 81 MG tablet, Take 1 tablet (81 mg total) by mouth daily., Disp: 90 tablet, Rfl: 2   cariprazine  (VRAYLAR ) 3 MG capsule, Take 1 capsule (3 mg total) by mouth daily., Disp: 90 capsule, Rfl: 0   clopidogrel  (PLAVIX ) 75 MG tablet, Take 1 tablet  (75 mg total) by mouth daily., Disp: 90 tablet, Rfl: 1   DULoxetine  (CYMBALTA ) 60 MG capsule, Take 1 capsule (60 mg total) by mouth 2 (two) times daily., Disp: 180 capsule, Rfl: 1   ezetimibe  (ZETIA ) 10 MG tablet, Take 1 tablet (10 mg total) by mouth daily., Disp: 90 tablet, Rfl: 1   fexofenadine  (ALLEGRA ) 180 MG tablet, Take 180 mg by mouth daily., Disp: , Rfl:    LORazepam  (ATIVAN ) 1 MG tablet, Take 1 tablet (1 mg total) by mouth every 8 (eight) hours as needed for anxiety., Disp: 180 tablet, Rfl: 0   meclizine  (ANTIVERT ) 25 MG tablet, Take 1 tablet (25 mg total) by mouth 3 (three) times daily as needed for dizziness., Disp: 90 tablet, Rfl: 2   Multiple Vitamin (MULTIVITAMIN WITH MINERALS) TABS tablet, Take 1 tablet by mouth daily. Men's Multivitamin, Disp: , Rfl:    nitroGLYCERIN  (NITROSTAT ) 0.4 MG SL tablet, Dissolve 1 tablet (0.4 mg total) under the tongue every 5 (five) minutes up to 3 doses as needed for chest pain. If no relief after 3 doses call 911., Disp: 25 tablet, Rfl: 1   ondansetron  (ZOFRAN ) 4 MG tablet, Take 1 tablet (4 mg total) by mouth every 8 (eight) hours as needed for nausea or vomiting., Disp: 30 tablet, Rfl: 0   pantoprazole  (PROTONIX ) 40 MG tablet, Take 1 tablet (40 mg total) by mouth daily., Disp: 90 tablet, Rfl: 1   ranolazine  (RANEXA ) 1000 MG SR tablet, Take 1 tablet by mouth 2 times daily., Disp: 180 tablet, Rfl: 3   rosuvastatin  (CRESTOR ) 40 MG tablet, Take 1 tablet (40 mg total) by mouth daily., Disp: 90 tablet, Rfl: 1   Solriamfetol  HCl (SUNOSI ) 150 MG TABS, Take 1 tablet (150 mg total) by mouth every morning., Disp: 90 tablet, Rfl: 1   Ubrogepant  (UBRELVY ) 100 MG TABS, 1 po at onset of dizziness/headache.  May repeat once in 2 hours.  Max dose 200mg  qd, Disp: 30 tablet, Rfl: 1   Atogepant  (QULIPTA ) 60 MG TABS, 1/2 tablet po qd, Disp: , Rfl:    isosorbide  mononitrate (IMDUR ) 60 MG 24 hr tablet, Take 1 tablet (60 mg total) by mouth daily., Disp: , Rfl:    metoprolol   succinate (TOPROL -XL) 50 MG 24 hr tablet, 1/2 po qd, Disp: , Rfl:   Past Medical History:  Diagnosis Date   Abnormal EKG 06/13/2023   Abnormal stress test small area of mild ischemia inferior wall 05/24/2022   Aphasia 06/13/2023   Arthropathy of  lumbar facet joint 07/03/2023   Bilateral sacroiliitis 08/29/2023   Bipolar 1 disorder 02/04/2023   Chronic bilateral low back pain with right-sided sciatica 06/24/2023   Chronic headaches 01/30/2021   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   Coronary artery disease involving coronary bypass graft of native heart with angina pectoris 05/22/2016   Cath 2013 LAD 95% stenosis, D1 90% stenosis, RCA 20%, Circ 30%. Previous stent to D1 2012. EF 60%.   Degeneration of lumbar intervertebral disc 07/03/2023   Dizziness 10/27/2020   Excessive daytime sleepiness 09/11/2016   Gastroesophageal reflux disease 02/04/2023   Generalized anxiety disorder 05/22/2016   Herpes zoster 01/20/2020   History of small bowel obstruction    Admitted 10-14-2007 for partical small bowel obstruction and N.G. tube was placed. Admitted by Dr Gerard. 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   Other fatigue 06/13/2023   Paronychia of finger, left 08/22/2020   PLMD (periodic limb movement disorder) 09/11/2016   PUD (peptic ulcer disease)    Syncope 06/13/2023   TIA  (transient ischemic attack)    Tinnitus of both ears 06/26/2022   Tremor 02/04/2023   Objective:  PHYSICAL EXAM:   BP 90/60   Pulse 72   Temp 98.3 F (36.8 C) (Temporal)   Resp 18   Ht 5' 10 (1.778 m)   Wt 181 lb 1.6 oz (82.1 kg)   SpO2 96%   BMI 25.99 kg/m    GEN: Well nourished, well developed, in no acute distress  Cardiac: RRR; no murmurs,  Respiratory:  normal respiratory rate and pattern with no distress - normal breath sounds with no rales, rhonchi, wheezes or rubs MS: no deformity or atrophy  Skin: warm and dry, no rash  Neuro:  Alert and Oriented x 3, - CN II-Xii grossly intact Psych: appropriate -   Assessment & Plan:    Dizziness Continue meclizine  as needed Follow up with neurology as directed Vestibular migraine -     Qulipta ; 1/2 tablet po qd - try preventive at lower dose Use ubrelvy  as needed Coronary artery disease involving native coronary artery of native heart without angina pectoris  -     Metoprolol  Succinate ER; 1/2 po qd - decreased to 25mg  qd (for low blood pressure) Decrease imdur  to 60mg  qd total (for low blood pressure) Follow up with cardiology as directed Other fatigue Gait instability Shortness of breath Follow up with cardiology as directed Pt had chest xray done last week - results still pending GAD (generalized anxiety disorder) Continue meds Excessive daytime sleepiness Follow up with neurology as directed  Recommend pt to take short term leave from work due to trouble focusing, excessive daytime somnolence , gait instability Follow up with all specialists as directed     Follow-up: Return in about 2 weeks (around 09/21/2024) for follow-up.  An After Visit Summary was printed and given to the patient.  [facility] [phone]

## 2024-09-08 ENCOUNTER — Other Ambulatory Visit: Payer: Self-pay

## 2024-09-08 ENCOUNTER — Ambulatory Visit: Attending: Cardiology | Admitting: Cardiology

## 2024-09-08 ENCOUNTER — Encounter: Payer: Self-pay | Admitting: Cardiology

## 2024-09-08 VITALS — BP 100/80 | HR 75 | Ht 70.0 in | Wt 180.0 lb

## 2024-09-08 DIAGNOSIS — F411 Generalized anxiety disorder: Secondary | ICD-10-CM | POA: Diagnosis not present

## 2024-09-08 DIAGNOSIS — I2510 Atherosclerotic heart disease of native coronary artery without angina pectoris: Secondary | ICD-10-CM

## 2024-09-08 DIAGNOSIS — I10 Essential (primary) hypertension: Secondary | ICD-10-CM

## 2024-09-08 DIAGNOSIS — R0609 Other forms of dyspnea: Secondary | ICD-10-CM

## 2024-09-08 NOTE — Addendum Note (Signed)
 Addended by: ARLOA PLANAS D on: 09/08/2024 04:18 PM   Modules accepted: Orders

## 2024-09-08 NOTE — Progress Notes (Signed)
 Cardiology Office Note:    Date:  09/08/2024   ID:  Jon Gonzales, DOB 22-Dec-1960, MRN [id number]  PCP:  Nicholaus Credit, PA-C  Cardiologist:  Lamar Fitch, MD    Referring MD: Nicholaus Credit, PA-C   Chief Complaint  Patient presents with   Follow-up  Not feeling well  History of Present Illness:    Jon Gonzales is a 63 y.o. male  with past medical history significant for premature coronary artery disease.  In 2012 he got coronary bypass graft with 3 vessels LIMA to LAD SVG diagonal SVG to obtuse marginal.  In January 2021 he required drug-eluting stent to native diagonal branch.  He also had history of depression, anxiety, essential hypertension, dyslipidemia.  Also recently he started having some difficulty hearing to some suspicion for Mnire's disease.  Comes today to my office with a constellation of nonspecific complaint leading complaint is weakness fatigue tiredness.  Does have some chest pain but not typical exertional chest pain.  Obviously very concerned about that.  He is scheduled to have colonoscopy and gastroscopy done at the beginning of December it is a regular follow-up colonoscopy.  Past Medical History:  Diagnosis Date   Abnormal EKG 06/13/2023   Abnormal stress test small area of mild ischemia inferior wall 05/24/2022   Aphasia 06/13/2023   Arthropathy of lumbar facet joint 07/03/2023   Bilateral sacroiliitis 08/29/2023   Bipolar 1 disorder 02/04/2023   Chronic bilateral low back pain with right-sided sciatica 06/24/2023   Chronic headaches 01/30/2021   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   Coronary artery disease involving coronary bypass graft of native heart with angina pectoris 05/22/2016   Cath 2013 LAD 95% stenosis, D1 90% stenosis, RCA 20%, Circ 30%. Previous stent to D1 2012. EF 60%.   Degeneration of lumbar intervertebral disc 07/03/2023   Dizziness 10/27/2020    Excessive daytime sleepiness 09/11/2016   Gastroesophageal reflux disease 02/04/2023   Generalized anxiety disorder 05/22/2016   Herpes zoster 01/20/2020   History of small bowel obstruction    Admitted 10-14-2007 for partical small bowel obstruction and N.G. tube was placed. Admitted by Dr Gerard. 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   Other fatigue 06/13/2023   Paronychia of finger, left 08/22/2020   PLMD (periodic limb movement disorder) 09/11/2016   PUD (peptic ulcer disease)    Syncope 06/13/2023   TIA (transient ischemic attack)    Tinnitus of both ears 06/26/2022   Tremor 02/04/2023    Past Surgical History:  Procedure Laterality Date   ANTERIOR LAT LUMBAR FUSION N/A 06/12/2024   Procedure: PRONE TRANSPOSOAS INTERBODY FUSION, LUMBAR TWO-LUMBAR THREE, POSTERIOR PERCUTANEOUS NON-SEGMENTAL  INSTRUMENTION, LUMBAR TWO-LUMBAR THREE;  Surgeon: Lanis Pupa, MD;  Location: [facility] OR;  Service: Neurosurgery;  Laterality: N/A;  Prone transpsoas interbody fusion, L2-3. Posterior percutaneous non-segmental instrumentation, L2-3   CARDIAC CATHETERIZATION  10/2011, 01/2020   CHOLECYSTECTOMY     COLONOSCOPY  09/10/2012   Mild sigmoid  diverticulosis. Small internal hemorrhoids. Otherwise normal colonoscopy to terminal ileum   CORONARY ARTERY BYPASS GRAFT  02/19/2012   L - LAD, SVG - D1, SVG - OM   CORONARY STENT INTERVENTION N/A 11/05/2019   Procedure: CORONARY STENT  INTERVENTION;  Surgeon: Court Dorn PARAS, MD;  Location: [facility];  Service: Cardiovascular;  Laterality: N/A;  SVG to DIAG   ESOPHAGOGASTRODUODENOSCOPY  01/08/2007   Multiple duodenal ulcers with stenosis/stricture of the distal first portion of the duodenum (measuring approximantely 9mm) Mild gastritis.   LEFT HEART CATH AND CORS/GRAFTS ANGIOGRAPHY N/A 11/05/2019   Procedure: LEFT HEART CATH AND CORS/GRAFTS ANGIOGRAPHY;  Surgeon: Court Dorn PARAS, MD;  Location: [facility];  Service: Cardiovascular;  Laterality: N/A;   LEFT HEART CATH AND CORS/GRAFTS ANGIOGRAPHY N/A 08/29/2023   Procedure: LEFT HEART CATH AND CORS/GRAFTS ANGIOGRAPHY;  Surgeon: Verlin Lonni BIRCH, MD;  Location: [facility];  Service: Cardiovascular;  Laterality: N/A;   LUMBAR LAMINECTOMY/DECOMPRESSION MICRODISCECTOMY Left 02/14/2024   Procedure: LUMBAR LAMINECTOMY/DECOMPRESSION MICRODISCECTOMY LEFT LUMBAR TWO-LUMBAR THREE;  Surgeon: Lanis Pupa, MD;  Location: [facility] OR;  Service: Neurosurgery;  Laterality: Left;  MICRODISCECTOMY, LT, L23   RENAL ANGIOGRAPHY N/A 11/05/2019   Procedure: RENAL ANGIOGRAPHY;  Surgeon: Court Dorn PARAS, MD;  Location: [facility];  Service: Cardiovascular;  Laterality: N/A;   TONSILLECTOMY AND ADENOIDECTOMY     WRIST SURGERY Right     Current Medications: Current Meds  Medication Sig   aspirin  EC (ASPIRIN  LOW DOSE) 81 MG tablet Take 1 tablet (81 mg total) by mouth daily.   Atogepant  (QULIPTA ) 60 MG TABS 1/2 tablet po qd   cariprazine  (VRAYLAR ) 3 MG capsule Take 1 capsule (3 mg total) by mouth daily.   clopidogrel  (PLAVIX ) 75 MG tablet Take 1 tablet (75 mg total) by mouth daily.   DULoxetine  (CYMBALTA ) 60 MG capsule Take 1 capsule (60 mg total) by mouth 2 (two) times daily.   ezetimibe  (ZETIA ) 10 MG tablet Take 1 tablet (10 mg total) by mouth daily.   fexofenadine  (ALLEGRA ) 180 MG tablet Take 180 mg by mouth daily.   isosorbide  mononitrate (IMDUR ) 60 MG  24 hr tablet Take 1 tablet (60 mg total) by mouth daily.   LORazepam  (ATIVAN ) 1 MG tablet Take 1 tablet (1 mg total) by mouth every 8 (eight) hours as needed for anxiety.   meclizine  (ANTIVERT ) 25 MG tablet Take 1 tablet (25 mg total) by mouth 3 (three) times daily as needed for dizziness.   metoprolol  succinate (TOPROL  XL) 25 MG 24 hr tablet Take 1 tablet (25 mg total) by mouth daily.   Multiple Vitamin (MULTIVITAMIN WITH MINERALS) TABS tablet Take 1 tablet by mouth daily. Men's Multivitamin   nitroGLYCERIN  (NITROSTAT ) 0.4 MG SL tablet Dissolve 1 tablet (0.4 mg total) under the tongue every 5 (five) minutes up to 3 doses as needed for chest pain. If no relief after 3 doses call 911.   ondansetron  (ZOFRAN ) 4 MG tablet Take 1 tablet (4 mg total) by mouth every 8 (eight) hours as needed for nausea or vomiting.   pantoprazole  (PROTONIX ) 40 MG tablet Take 1 tablet (40 mg total) by mouth daily.   ranolazine  (RANEXA ) 1000 MG SR tablet Take 1 tablet by mouth 2 times daily.   rosuvastatin  (CRESTOR ) 40 MG tablet Take 1 tablet (40 mg total) by mouth daily.   Solriamfetol  HCl (SUNOSI ) 150 MG TABS Take 1 tablet (150 mg total) by mouth every morning.   Ubrogepant  (UBRELVY ) 100 MG  TABS 1 po at onset of dizziness/headache.  May repeat once in 2 hours.  Max dose 200mg  qd     Allergies:   Trintellix [vortioxetine] and Penicillins   Social History   Socioeconomic History   Marital status: Married    Spouse name: Not on file   Number of children: Not on file   Years of education: 16   Highest education level: Bachelor's degree (e.g., BA, AB, BS)  Occupational History   Occupation: Oncologist:   Tobacco Use   Smoking status: Never   Smokeless tobacco: Never  Vaping Use   Vaping status: Never Used  Substance and Sexual Activity   Alcohol use: Yes    Alcohol/week: 14.0 standard drinks of alcohol    Types: 14 Cans of beer per week    Comment: every other  day   Drug use: Never   Sexual activity: Not on file  Other Topics Concern   Not on file  Social History Narrative   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        Right Handed   Lives in two story home    Lives with wife   2 step children'   Social Drivers of Health   Financial Resource Strain: Low Risk  (06/04/2022)   Overall Financial Resource Strain (CARDIA)    Difficulty of Paying Living Expenses: Not hard at all  Food Insecurity: No Food Insecurity (06/04/2022)   Hunger Vital Sign    Worried About Running Out of Food in the Last Year: Never true    Ran Out of Food in the Last Year: Never true  Transportation Needs: No Transportation Needs (06/04/2022)   PRAPARE - Administrator, Civil Service (Medical): No    Lack of Transportation (Non-Medical): No  Physical Activity: Inactive (06/04/2022)   Exercise Vital Sign    Days of Exercise per Week: 0 days    Minutes of Exercise per Session: 0 min  Stress: No Stress Concern Present (06/04/2022)   [employer] of Occupational Health - Occupational Stress Questionnaire    Feeling of Stress : Not at all  Social Connections: Moderately Integrated (06/04/2022)   Social Connection and Isolation Panel    Frequency of Communication with Friends and Family: More than three times a week    Frequency of Social Gatherings with Friends and Family: More than three times a week    Attends Religious Services: Never    Database Administrator or Organizations: Yes    Attends Engineer, Structural: More than 4 times per year    Marital Status: Married     Family History: The patient's family history includes Aneurysm (age of onset: 35) in his mother; CAD (age of onset: 53) in his father; Dementia in an other family member; Early death in his father and mother; Healthy in his brother; Heart disease in his sister. ROS:   Please see the history of present illness.    All 14 point review of  systems negative except as described per history of present illness  EKGs/Labs/Other Studies Reviewed:         Recent Labs: 08/24/2024: ALT 43; BUN 16; Creatinine, Ser 1.43; Hemoglobin 15.5; Platelets 167; Potassium 3.9; Sodium 140; TSH 1.930  Recent Lipid Panel    Component Value Date/Time   CHOL 127 03/03/2024 0737   TRIG 97 03/03/2024 0737   HDL 55  03/03/2024 0737   CHOLHDL 2.3 03/03/2024 0737   LDLCALC 54 03/03/2024 0737    Physical Exam:    VS:  BP 100/80   Pulse 75   Ht 5' 10 (1.778 m)   Wt 180 lb (81.6 kg)   SpO2 97%   BMI 25.83 kg/m     Wt Readings from Last 3 Encounters:  09/08/24 180 lb (81.6 kg)  09/07/24 181 lb 1.6 oz (82.1 kg)  09/03/24 171 lb (77.6 kg)     GEN:  Well nourished, well developed in no acute distress HEENT: Normal NECK: No JVD; No carotid bruits LYMPHATICS: No lymphadenopathy CARDIAC: RRR, no murmurs, no rubs, no gallops RESPIRATORY:  Clear to auscultation without rales, wheezing or rhonchi  ABDOMEN: Soft, non-tender, non-distended MUSCULOSKELETAL:  No edema; No deformity  SKIN: Warm and dry LOWER EXTREMITIES: no swelling NEUROLOGIC:  Alert and oriented x 3 PSYCHIATRIC:  Normal affect   ASSESSMENT:    1. Dyspnea on exertion   2. Atherosclerosis of native coronary artery of native heart without angina pectoris   3. Primary hypertension   4. Generalized anxiety disorder    PLAN:    In order of problems listed above:  Constellation of nonspecific symptoms majority with weakness fatigue, so far workup has been negative.  I will schedule him to have echocardiogram done as well as stress test to make sure were not dealing with any worsening of ischemia.  He did have a stress test done a year ago which showed ischemia involving inferior wall but cardiac catheterization done after only 50% lesion.  Again we will do stress test to see if there is no issue with other territories. Essential hypertension blood pressure well-controlled we  will continue present management. Dyslipidemia, last cholesterol profile have is from 6 months ago with LDL 54 HDL 55, will continue present management. General Anxiety disorder could be contributing.  But of course we will rule out organic reason for this.   Medication Adjustments/Labs and Tests Ordered: Current medicines are reviewed at length with the patient today.  Concerns regarding medicines are outlined above.  Orders Placed This Encounter  Procedures   MYOCARDIAL PERFUSION IMAGING   ECHOCARDIOGRAM COMPLETE   Medication changes: No orders of the defined types were placed in this encounter.   Signed, Lamar DOROTHA Fitch, MD, [facility] 09/08/2024 2:49 PM    [facility]

## 2024-09-08 NOTE — Patient Instructions (Signed)
 Medication Instructions:  Your physician recommends that you continue on your current medications as directed. Please refer to the Current Medication list given to you today.  *If you need a refill on your cardiac medications before your next appointment, please call your pharmacy*   Lab Work: None Ordered If you have labs (blood work) drawn today and your tests are completely normal, you will receive your results only by: MyChart Message (if you have MyChart) OR A paper copy in the mail If you have any lab test that is abnormal or we need to change your treatment, we will call you to review the results.   Testing/Procedures:  Your physician has requested that you have an echocardiogram. Echocardiography is a painless test that uses sound waves to create images of your heart. It provides your doctor with information about the size and shape of your heart and how well your heart's chambers and valves are working. This procedure takes approximately one hour. There are no restrictions for this procedure. Please do NOT wear cologne, perfume, aftershave, or lotions (deodorant is allowed). Please arrive 15 minutes prior to your appointment time.  Please note: We ask at that you not bring children with you during ultrasound (echo/ vascular) testing. Due to room size and safety concerns, children are not allowed in the ultrasound rooms during exams. Our front office staff cannot provide observation of children in our lobby area while testing is being conducted. An adult accompanying a patient to their appointment will only be allowed in the ultrasound room at the discretion of the ultrasound technician under special circumstances. We apologize for any inconvenience.  Your physician has requested that you have a lexiscan  myoview . For further information please visit [email or web address] Please follow instruction sheet, as given.  The test will take approximately 3 to 4 hours to complete; you may bring  reading material.  If someone comes with you to your appointment, they will need to remain in the main lobby due to limited space in the testing area. **If you are pregnant or breastfeeding, please notify the nuclear lab prior to your appointment**  How to prepare for your Myocardial Perfusion Test: Do not eat or drink 3 hours prior to your test, except you may have water . Do not consume products containing caffeine (regular or decaffeinated) 12 hours prior to your test. (ex: coffee, chocolate, sodas, tea). Do bring a list of your current medications with you.  If not listed below, you may take your medications as normal. Do wear comfortable clothes (no dresses or overalls) and walking shoes, tennis shoes preferred (No heels or open toe shoes are allowed). Do NOT wear cologne, perfume, aftershave, or lotions (deodorant is allowed). If these instructions are not followed, your test will have to be rescheduled.     Follow-Up: At [facility], you and your health needs are our priority.  As part of our continuing mission to provide you with exceptional heart care, we have created designated Provider Care Teams.  These Care Teams include your primary Cardiologist (physician) and Advanced Practice Providers (APPs -  Physician Assistants and Nurse Practitioners) who all work together to provide you with the care you need, when you need it.  We recommend signing up for the patient portal called MyChart.  Sign up information is provided on this After Visit Summary.  MyChart is used to connect with patients for Virtual Visits (Telemedicine).  Patients are able to view lab/test results, encounter notes, upcoming appointments, etc.  Non-urgent messages can be sent  to your provider as well.   To learn more about what you can do with MyChart, go to [email or web address].    Your next appointment:   3 month(s)  The format for your next appointment:   In Person  Provider:   Lamar Fitch, MD     Other Instructions NA

## 2024-09-09 ENCOUNTER — Telehealth (HOSPITAL_COMMUNITY): Payer: Self-pay

## 2024-09-09 ENCOUNTER — Other Ambulatory Visit: Payer: Self-pay

## 2024-09-09 NOTE — Telephone Encounter (Signed)
 Attempted to contact the patient with his instructions. Wasn't available and unable to leave a message. S.Laurynn Mccorvey CCT

## 2024-09-14 ENCOUNTER — Other Ambulatory Visit: Payer: Self-pay | Admitting: Cardiology

## 2024-09-14 DIAGNOSIS — R0609 Other forms of dyspnea: Secondary | ICD-10-CM

## 2024-09-15 ENCOUNTER — Other Ambulatory Visit: Payer: Self-pay | Admitting: Neurology

## 2024-09-15 ENCOUNTER — Ambulatory Visit (HOSPITAL_COMMUNITY): Admission: RE | Admit: 2024-09-15 | Attending: Cardiology | Admitting: Cardiology

## 2024-09-15 ENCOUNTER — Other Ambulatory Visit: Payer: Self-pay | Admitting: Psychiatry

## 2024-09-15 DIAGNOSIS — F411 Generalized anxiety disorder: Secondary | ICD-10-CM

## 2024-09-15 NOTE — Telephone Encounter (Signed)
 Please call to schedule FU, was a no show last appt.

## 2024-09-16 ENCOUNTER — Other Ambulatory Visit: Payer: Self-pay

## 2024-09-16 ENCOUNTER — Ambulatory Visit: Attending: Cardiology

## 2024-09-16 DIAGNOSIS — R0609 Other forms of dyspnea: Secondary | ICD-10-CM | POA: Diagnosis not present

## 2024-09-16 LAB — MYOCARDIAL PERFUSION IMAGING
LV dias vol: 66 mL (ref 62–150)
LV sys vol: 29 mL (ref 4.2–5.8)
Nuc Stress EF: 57 %
Peak HR: 82 {beats}/min
Rest HR: 65 {beats}/min
Rest Nuclear Isotope Dose: 10.7 mCi
SDS: 1
SRS: 1
SSS: 2
ST Depression (mm): 0 mm
Stress Nuclear Isotope Dose: 30.2 mCi
TID: 1.09

## 2024-09-16 MED ORDER — TECHNETIUM TC 99M TETROFOSMIN IV KIT
10.7000 | PACK | Freq: Once | INTRAVENOUS | Status: AC | PRN
  Administered 2024-09-16: 10.7 via INTRAVENOUS

## 2024-09-16 MED ORDER — TECHNETIUM TC 99M TETROFOSMIN IV KIT
30.2000 | PACK | Freq: Once | INTRAVENOUS | Status: AC | PRN
  Administered 2024-09-16: 30.2 via INTRAVENOUS

## 2024-09-16 MED ORDER — REGADENOSON 0.4 MG/5ML IV SOLN
0.4000 mg | Freq: Once | INTRAVENOUS | Status: AC
  Administered 2024-09-16: 0.4 mg via INTRAVENOUS

## 2024-09-16 MED ORDER — LORAZEPAM 1 MG PO TABS
1.0000 mg | ORAL_TABLET | Freq: Three times a day (TID) | ORAL | 0 refills | Status: DC | PRN
  Filled 2024-09-16: qty 60, 20d supply, fill #0

## 2024-09-16 NOTE — Telephone Encounter (Signed)
 Lvm for pt to call and schedule

## 2024-09-17 ENCOUNTER — Other Ambulatory Visit (HOSPITAL_BASED_OUTPATIENT_CLINIC_OR_DEPARTMENT_OTHER): Payer: Self-pay

## 2024-09-17 NOTE — Telephone Encounter (Signed)
 Last refilled by patient on : 06/09/24 with 1 refill on file  Last office visit : 11/07/23 Next office visit :  09/23/24  Please sign courtesy refill

## 2024-09-18 ENCOUNTER — Other Ambulatory Visit: Payer: Self-pay | Admitting: Physician Assistant

## 2024-09-18 ENCOUNTER — Other Ambulatory Visit (HOSPITAL_BASED_OUTPATIENT_CLINIC_OR_DEPARTMENT_OTHER): Payer: Self-pay

## 2024-09-18 ENCOUNTER — Ambulatory Visit: Admitting: Gastroenterology

## 2024-09-18 ENCOUNTER — Encounter: Payer: Self-pay | Admitting: Gastroenterology

## 2024-09-18 VITALS — BP 103/65 | HR 88 | Temp 97.3°F | Resp 20 | Ht 70.0 in | Wt 177.0 lb

## 2024-09-18 DIAGNOSIS — K2980 Duodenitis without bleeding: Secondary | ICD-10-CM | POA: Diagnosis not present

## 2024-09-18 DIAGNOSIS — R5383 Other fatigue: Secondary | ICD-10-CM

## 2024-09-18 DIAGNOSIS — K269 Duodenal ulcer, unspecified as acute or chronic, without hemorrhage or perforation: Secondary | ICD-10-CM | POA: Diagnosis not present

## 2024-09-18 DIAGNOSIS — Z1211 Encounter for screening for malignant neoplasm of colon: Secondary | ICD-10-CM

## 2024-09-18 DIAGNOSIS — K31A19 Gastric intestinal metaplasia without dysplasia, unspecified site: Secondary | ICD-10-CM | POA: Diagnosis not present

## 2024-09-18 DIAGNOSIS — K219 Gastro-esophageal reflux disease without esophagitis: Secondary | ICD-10-CM | POA: Diagnosis not present

## 2024-09-18 DIAGNOSIS — K635 Polyp of colon: Secondary | ICD-10-CM | POA: Diagnosis not present

## 2024-09-18 DIAGNOSIS — K5730 Diverticulosis of large intestine without perforation or abscess without bleeding: Secondary | ICD-10-CM | POA: Diagnosis not present

## 2024-09-18 DIAGNOSIS — K315 Obstruction of duodenum: Secondary | ICD-10-CM | POA: Diagnosis not present

## 2024-09-18 DIAGNOSIS — K259 Gastric ulcer, unspecified as acute or chronic, without hemorrhage or perforation: Secondary | ICD-10-CM | POA: Diagnosis not present

## 2024-09-18 DIAGNOSIS — I2510 Atherosclerotic heart disease of native coronary artery without angina pectoris: Secondary | ICD-10-CM | POA: Diagnosis not present

## 2024-09-18 DIAGNOSIS — D125 Benign neoplasm of sigmoid colon: Secondary | ICD-10-CM

## 2024-09-18 DIAGNOSIS — K640 First degree hemorrhoids: Secondary | ICD-10-CM | POA: Diagnosis not present

## 2024-09-18 DIAGNOSIS — K3189 Other diseases of stomach and duodenum: Secondary | ICD-10-CM | POA: Diagnosis not present

## 2024-09-18 DIAGNOSIS — R29898 Other symptoms and signs involving the musculoskeletal system: Secondary | ICD-10-CM

## 2024-09-18 DIAGNOSIS — F319 Bipolar disorder, unspecified: Secondary | ICD-10-CM | POA: Diagnosis not present

## 2024-09-18 MED ORDER — SUNOSI 150 MG PO TABS
150.0000 mg | ORAL_TABLET | ORAL | 2 refills | Status: DC
  Filled 2024-09-18: qty 30, 30d supply, fill #0

## 2024-09-18 MED ORDER — SODIUM CHLORIDE 0.9 % IV SOLN: 500.0000 mL | Freq: Once | INTRAVENOUS | Status: DC

## 2024-09-18 MED ORDER — PANTOPRAZOLE SODIUM 40 MG PO TBEC
40.0000 mg | DELAYED_RELEASE_TABLET | Freq: Two times a day (BID) | ORAL | 3 refills | Status: AC
  Filled 2024-09-18: qty 90, 90d supply, fill #0
  Filled 2024-09-18 – 2024-09-21 (×2): qty 90, 45d supply, fill #0
  Filled 2024-11-03: qty 90, 45d supply, fill #1

## 2024-09-18 NOTE — Op Note (Signed)
 [facility] Patient Name: Jon Gonzales Procedure Date: 09/18/2024 11:27 AM MRN: [id number] Endoscopist: Lynnie Bring , MD, [id number] Age: 63 Referring MD:  Date of Birth: [birth year] Gender: Male Account #: [id number] Procedure:                Upper GI endoscopy Indications:              Epigastric abdominal pain/GERD Medicines:                Monitored Anesthesia Care Procedure:                Pre-Anesthesia Assessment:                           - Prior to the procedure, a History and Physical                            was performed, and patient medications and                            allergies were reviewed. The patient's tolerance of                            previous anesthesia was also reviewed. The risks                            and benefits of the procedure and the sedation                            options and risks were discussed with the patient.                            All questions were answered, and informed consent                            was obtained. Prior Anticoagulants: The patient has                            taken Plavix  (clopidogrel ), last dose was 5 days                            prior to procedure. ASA Grade Assessment: II - A                            patient with mild systemic disease. After reviewing                            the risks and benefits, the patient was deemed in                            satisfactory condition to undergo the procedure.                           After obtaining informed consent, the endoscope was  passed under direct vision. Throughout the                            procedure, the patient's blood pressure, pulse, and                            oxygen saturations were monitored continuously. The                            GIF [id number] #[id number] was introduced through the                            mouth, and advanced to the second part of duodenum.                            The  upper GI endoscopy was accomplished without                            difficulty. The patient tolerated the procedure                            well. Scope In: Scope Out: Findings:                 The examined esophagus was normal. Multiple                            biopsies obtained from proximal, mid and distal                            esophagus to rule out eosinophilic esophagitis.                           The Z-line was regular and was found 40 cm from the                            incisors.                           Three non-bleeding cratered gastric ulcers with no                            stigmata of bleeding, but with surrounding moderate                            gastritis were found in the gastric antrum. The                            largest lesion was 6 mm in largest dimension.                            Biopsies were taken with a cold forceps for                            histology.  An acquired benign-appearing, intrinsic mild                            stenosis was found in the D1/D2 junction with a                            small superficial erosion and was traversed.                           The remaining examined duodenum was normal.                            Biopsies for histology were taken with a cold                            forceps for evaluation of celiac disease. Complications:            No immediate complications. Estimated Blood Loss:     Estimated blood loss: none. Impression:               - Small Non-bleeding gastric ulcers with no                            stigmata of bleeding. Biopsied.                           - Acquired duodenal stenosis with superficial                            duodenal erosion.                           - Normal examined duodenum. Biopsied. Recommendation:           - Patient has a contact number available for                            emergencies. The signs and symptoms of potential                             delayed complications were discussed with the                            patient. Return to normal activities tomorrow.                            Written discharge instructions were provided to the                            patient.                           - Resume previous diet.                           - Increase Protonix  to 40 mg p.o. twice daily x 8  weeks, then once a day.                           - No aspirin , ibuprofen, naproxen, or other                            non-steroidal anti-inflammatory drugs.                           - Await pathology results.                           - Proceed with colonoscopy.                           - The findings and recommendations were discussed                            with the patient's family. Lynnie Bring, MD 09/18/2024 12:05:11 PM This report has been signed electronically.

## 2024-09-18 NOTE — Progress Notes (Signed)
 Report to PACU, RN, vss, BBS= Clear.

## 2024-09-18 NOTE — Patient Instructions (Signed)
 May restart Plavix  as normal on Monday, 09/21/24.  YOU HAD AN ENDOSCOPIC PROCEDURE TODAY AT THE  ENDOSCOPY CENTER:   Refer to the procedure report that was given to you for any specific questions about what was found during the examination.  If the procedure report does not answer your questions, please call your gastroenterologist to clarify.  If you requested that your care partner not be given the details of your procedure findings, then the procedure report has been included in a sealed envelope for you to review at your convenience later.  YOU SHOULD EXPECT: Some feelings of bloating in the abdomen. Passage of more gas than usual.  Walking can help get rid of the air that was put into your GI tract during the procedure and reduce the bloating. If you had a lower endoscopy (such as a colonoscopy or flexible sigmoidoscopy) you may notice spotting of blood in your stool or on the toilet paper. If you underwent a bowel prep for your procedure, you may not have a normal bowel movement for a few days.  Please Note:  You might notice some irritation and congestion in your nose or some drainage.  This is from the oxygen used during your procedure.  There is no need for concern and it should clear up in a day or so.  SYMPTOMS TO REPORT IMMEDIATELY:  Following lower endoscopy (colonoscopy or flexible sigmoidoscopy):  Excessive amounts of blood in the stool  Significant tenderness or worsening of abdominal pains  Swelling of the abdomen that is new, acute  Fever of 100F or higher  Following upper endoscopy (EGD)  Vomiting of blood or coffee ground material  New chest pain or pain under the shoulder blades  Painful or persistently difficult swallowing  New shortness of breath  Fever of 100F or higher  Black, tarry-looking stools  For urgent or emergent issues, a gastroenterologist can be reached at any hour by calling [phone]. Do not use MyChart messaging for urgent concerns.     DIET:  We do recommend a small meal at first, but then you may proceed to your regular diet.  Drink plenty of fluids but you should avoid alcoholic beverages for 24 hours.  ACTIVITY:  You should plan to take it easy for the rest of today and you should NOT DRIVE or use heavy machinery until tomorrow (because of the sedation medicines used during the test).    FOLLOW UP: Our staff will call the number listed on your records the next business day following your procedure.  We will call around 7:15- 8:00 am to check on you and address any questions or concerns that you may have regarding the information given to you following your procedure. If we do not reach you, we will leave a message.     If any biopsies were taken you will be contacted by phone or by letter within the next 1-3 weeks.  Please call us  at [phone] if you have not heard about the biopsies in 3 weeks.    SIGNATURES/CONFIDENTIALITY: You and/or your care partner have signed paperwork which will be entered into your electronic medical record.  These signatures attest to the fact that that the information above on your After Visit Summary has been reviewed and is understood.  Full responsibility of the confidentiality of this discharge information lies with you and/or your care-partner.

## 2024-09-18 NOTE — Op Note (Signed)
 [facility] Patient Name: Jon Gonzales Procedure Date: 09/18/2024 11:19 AM MRN: [id number] Endoscopist: Lynnie Bring , MD, [id number] Age: 63 Referring MD:  Date of Birth: 02-Apr-1961 Gender: Male Account #: [id number] Procedure:                Colonoscopy Indications:              Screening for colorectal malignant neoplasm Medicines:                Monitored Anesthesia Care Procedure:                Pre-Anesthesia Assessment:                           - Prior to the procedure, a History and Physical                            was performed, and patient medications and                            allergies were reviewed. The patient's tolerance of                            previous anesthesia was also reviewed. The risks                            and benefits of the procedure and the sedation                            options and risks were discussed with the patient.                            All questions were answered, and informed consent                            was obtained. Prior Anticoagulants: The patient has                            taken Plavix  (clopidogrel ), last dose was 5 days                            prior to procedure. ASA Grade Assessment: II - A                            patient with mild systemic disease. After reviewing                            the risks and benefits, the patient was deemed in                            satisfactory condition to undergo the procedure.                           After obtaining informed consent, the colonoscope  was passed under direct vision. Throughout the                            procedure, the patient's blood pressure, pulse, and                            oxygen saturations were monitored continuously. The                            CF [id number] #[id number] was introduced through the anus                            and advanced to the the cecum, identified by                             appendiceal orifice and ileocecal valve. The                            colonoscopy was performed without difficulty. The                            patient tolerated the procedure well. The quality                            of the bowel preparation was good. The ileocecal                            valve, appendiceal orifice, and rectum were                            photographed. Scope In: 11:42:19 AM Scope Out: 11:57:28 AM Scope Withdrawal Time: 0 hours 10 minutes 20 seconds  Total Procedure Duration: 0 hours 15 minutes 9 seconds  Findings:                 A 6 mm polyp was found in the proximal sigmoid                            colon. The polyp was sessile. The polyp was removed                            with a cold snare. Resection and retrieval were                            complete.                           A few small-mouthed diverticula were found in the                            sigmoid colon.                           Non-bleeding internal hemorrhoids were found during  retroflexion. The hemorrhoids were small and Grade                            I (internal hemorrhoids that do not prolapse).                           Retroflexion in the right colon was performed.                           The exam was otherwise without abnormality on                            direct and retroflexion views. Complications:            No immediate complications. Estimated Blood Loss:     Estimated blood loss: none. Impression:               - One 6 mm polyp in the proximal sigmoid colon,                            removed with a cold snare. Resected and retrieved.                           - Mild sigmoid diverticulosis.                           - Non-bleeding internal hemorrhoids.                           - The examination was otherwise normal on direct                            and retroflexion views. Recommendation:           - Patient has a contact number  available for                            emergencies. The signs and symptoms of potential                            delayed complications were discussed with the                            patient. Return to normal activities tomorrow.                            Written discharge instructions were provided to the                            patient.                           - Resume previous diet.                           - Continue present medications.                           -  Await pathology results.                           - Repeat colonoscopy for surveillance based on                            pathology results.                           - Resume Plavix  (clopidogrel ) at prior dose in 3                            days (12/8).                           - The findings and recommendations were discussed                            with the patient's family. Lynnie Bring, MD 09/18/2024 12:08:47 PM This report has been signed electronically.

## 2024-09-18 NOTE — Progress Notes (Signed)
 Called to room to assist during endoscopic procedure.  Patient ID and intended procedure confirmed with present staff. Received instructions for my participation in the procedure from the performing physician.

## 2024-09-18 NOTE — Progress Notes (Signed)
 Chief Complaint:colon cancer screening Primary GI Doctor: Dr. Charlanne   HPI:  Jon Gonzales is a  63  year old male Jon Gonzales with past medical history of GERD,CAD,Bipolar disorder, who was referred to me by Nicholaus Credit, PA-C on 02/28/24 for a evaluation of colon cancer screening .     05/29/24 follow-up with cardiology for preop evaluation: stable no recent events.   Interval History   Jon Gonzales presents to discuss colon screening colonoscopy as well as discuss changes in appetite and weight loss over the course of the last few weeks. Accompanied by Dr. Abigail Free.  Jon Gonzales reports he he just does not feel hungry. He has lost 10lbs in last week. Denies early satiety or fullness. Jon Gonzales does have a lot of bloating.  Jon Gonzales admits he does drink carbonated beverages.   Jon Gonzales has history of GERD and taking Pantoprazole  40 mg po daily. Jon Gonzales denies dysphagia.   He has BM every 1-2 days and reports its small amount.    He has been on Wegovy  for 2 years. He has held it week ago Monday.    He reports fatigue and has been sleeping longer periods of time.   He drinks 1-2 (12 oz)  beer a day.    Jon Gonzales on Plavix  75mg  and ASA 81 mg.   Jon Gonzales taking Naproxen and Goody powders prn for headaches.    Patients last colonoscopy was 10 years ago at [facility] and per Jon Gonzales was normal.  Patients last EGD was 10 years and had pyloric stenosis from NSAID use.   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      Wt Readings from Last 3 Encounters:  08/27/24 177 lb (80.3 kg)  08/24/24 170 lb 6.4 oz (77.3 kg)  08/05/24 189 lb (85.7 kg)        Past Medical History:  Diagnosis Date   Abnormal EKG 06/13/2023   Abnormal stress test small area of mild ischemia inferior wall 05/24/2022   Aphasia 06/13/2023   Arthropathy of lumbar facet joint 07/03/2023   Bilateral sacroiliitis 08/29/2023   Bipolar 1 disorder 02/04/2023   Chronic  bilateral low back pain with right-sided sciatica 06/24/2023   Chronic headaches 01/30/2021   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   Coronary artery disease involving coronary bypass graft of native heart with angina pectoris 05/22/2016    Cath 2013 LAD 95% stenosis, D1 90% stenosis, RCA 20%, Circ 30%. Previous stent to D1 2012. EF 60%.   Degeneration of lumbar intervertebral disc 07/03/2023   Dizziness 10/27/2020   Excessive daytime sleepiness 09/11/2016   Gastroesophageal reflux disease 02/04/2023   Generalized anxiety disorder 05/22/2016   Herpes zoster 01/20/2020   History of small bowel obstruction      Admitted 10-14-2007 for partical small bowel obstruction and N.G. tube was placed. Admitted by Dr Gerard. 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  Other fatigue 06/13/2023   Paronychia of finger, left 08/22/2020   PLMD (periodic limb movement disorder) 09/11/2016   PUD (peptic ulcer disease)     Syncope 06/13/2023   TIA (transient ischemic attack)     Tinnitus of both ears 06/26/2022   Tremor 02/04/2023               Past Surgical History:  Procedure Laterality Date   ANTERIOR LAT LUMBAR FUSION N/A 06/12/2024    Procedure: PRONE TRANSPOSOAS INTERBODY FUSION, LUMBAR TWO-LUMBAR THREE, POSTERIOR PERCUTANEOUS NON-SEGMENTAL  INSTRUMENTION, LUMBAR TWO-LUMBAR THREE;  Surgeon:  Lanis Pupa, MD;  Location: [facility] OR;  Service: Neurosurgery;  Laterality: N/A;  Prone transpsoas interbody fusion, L2-3. Posterior percutaneous non-segmental instrumentation, L2-3   CARDIAC CATHETERIZATION   10/2011, 01/2020   CHOLECYSTECTOMY       COLONOSCOPY   09/10/2012    Mild sigmoid diverticulosis. Small internal hemorrhoids. Otherwise normal colonoscopy to terminal ileum   CORONARY ARTERY BYPASS GRAFT   02/19/2012    L - LAD, SVG - D1, SVG - OM   CORONARY STENT INTERVENTION N/A 11/05/2019    Procedure: CORONARY STENT INTERVENTION;  Surgeon: Court Dorn PARAS, MD;  Location: [facility] INVASIVE CV LAB;  Service: Cardiovascular;  Laterality: N/A;  SVG to DIAG   ESOPHAGOGASTRODUODENOSCOPY   01/08/2007    Multiple duodenal ulcers with stenosis/stricture of the distal first portion of the duodenum (measuring approximantely 9mm) Mild gastritis.   LEFT HEART CATH AND CORS/GRAFTS ANGIOGRAPHY N/A 11/05/2019    Procedure: LEFT HEART CATH AND CORS/GRAFTS ANGIOGRAPHY;  Surgeon: Court Dorn PARAS, MD;  Location: [facility] INV[facility] CV [facility];  Service: Cardiovascular;  Laterality: N/A;   LEFT HEART CATH AND CORS/GRAFTS ANGIOGRAPHY N/A 08/29/2023    Procedure: LEFT HEART CATH AND CORS/GRAFTS ANGIOGRAPHY;  Surgeon: Verlin Lonni BIRCH, MD;  Location: [facility] IN[facility];  Service: Cardiovascular;  Laterality: N/A;   LUMBAR LAMINECTOMY/DECOMPRESSION MICRODISCECTOMY Left 02/14/2024    Procedure: LUMBAR LAMINECTOMY/DECOMPRESSION MICRODISCECTOMY LEFT LUMBAR TWO-LUMBAR THREE;  Surgeon: Lanis Pupa, MD;  Location: [facility] OR;  Service: Neurosurgery;  Laterality: Left;  MICRODISCECTOMY, LT, L23   RENAL ANGIOGRAPHY N/A 11/05/2019    Procedure: RENAL ANGIOGRAPHY;  Surgeon: Court Dorn PARAS, MD;  Location: [facility];  Service: Cardiovascular;  Laterality: N/A;   TONSILLECTOMY AND ADENOIDECTOMY       WRIST SURGERY Right                  Current Outpatient Medications  Medication Sig Dispense Refill    aspirin  EC (ASPIRIN  LOW DOSE) 81 MG tablet Take 1 tablet (81 mg total) by mouth daily. 90 tablet 2   cariprazine  (VRAYLAR ) 3 MG capsule Take 1 capsule (3 mg total) by mouth daily. 90 capsule 0   clopidogrel  (PLAVIX ) 75 MG tablet Take 1 tablet (75 mg total) by mouth daily. 90 tablet 1   DULoxetine  (CYMBALTA ) 60 MG capsule Take 1 capsule (60 mg total) by mouth 2 (two) times daily. 180 capsule 1   ezetimibe  (ZETIA ) 10 MG tablet Take 1 tablet (10 mg total) by mouth daily. 90 tablet 1   fexofenadine  (ALLEGRA ) 180 MG tablet Take 180 mg by mouth daily.       isosorbide  mononitrate (IMDUR ) 60 MG 24 hr tablet Take 2 tablets (120 mg total) by mouth daily. 180 tablet 1   LORazepam  (ATIVAN ) 1 MG tablet Take 1 tablet (1 mg total) by mouth every 8 (eight) hours as needed for anxiety. 180 tablet 0   meclizine  (ANTIVERT ) 25 MG tablet  Take 1 tablet (25 mg total) by mouth 3 (three) times daily as needed for dizziness. 90 tablet 2   metoprolol  succinate (TOPROL -XL) 50 MG 24 hr tablet Take 1 tablet by mouth daily. Take with or immediately following a meal. 90 tablet 1   Multiple Vitamin (MULTIVITAMIN WITH MINERALS) TABS tablet Take 1 tablet by mouth daily. Men's Multivitamin       Na Sulfate-K Sulfate-Mg Sulfate concentrate (SUPREP) 17.5-3.13-1.6 GM/177ML SOLN Take 1 kit (354 mLs total) by mouth once for 1 dose. 354 mL 0   nitroGLYCERIN  (NITROSTAT ) 0.4 MG SL tablet Dissolve 1 tablet (0.4 mg total) under the tongue every 5 (five) minutes up to 3 doses as needed for chest pain. If no relief after 3 doses call 911. 25 tablet 1   ondansetron  (ZOFRAN ) 4 MG tablet Take 1 tablet (4 mg total) by mouth every 8 (eight) hours as needed for nausea or vomiting. 30 tablet 0   pantoprazole  (PROTONIX ) 40 MG tablet Take 1 tablet (40 mg total) by mouth daily. 90 tablet 1   ranolazine  (RANEXA ) 1000 MG SR tablet Take 1 tablet by mouth 2 times daily. 180 tablet 3   rosuvastatin  (CRESTOR ) 40 MG tablet Take 1 tablet (40 mg total) by mouth  daily. 90 tablet 1   semaglutide -weight management (WEGOVY ) 2.4 MG/0.75ML SOAJ SQ injection Inject 2.4 mg into the skin.       Solriamfetol  HCl (SUNOSI ) 150 MG TABS Take 1 tablet (150 mg total) by mouth every morning. 90 tablet 1   Ubrogepant  (UBRELVY ) 100 MG TABS 1 po at onset of dizziness/headache.  May repeat once in 2 hours.  Max dose 200mg  qd 30 tablet 1   Atogepant  (QULIPTA ) 60 MG TABS Take 1 tablet (60 mg total) by mouth daily. (Jon Gonzales not taking: Reported on 08/27/2024) 90 tablet 1      No current facility-administered medications for this visit.             Allergies as of 08/27/2024 - Review Complete 08/27/2024  Allergen Reaction Noted   Trintellix [vortioxetine] Other (See Comments) 10/30/2019   Penicillins Rash 11/09/2014           Family History  Problem Relation Age of Onset   Aneurysm Mother 82        cerebral   Early death Mother     CAD Father 61   Early death Father     Heart disease Sister          Unclear   Healthy Brother     Dementia Other          several individuals on extended paternal side of family          Review of Systems:    Constitutional: No weight loss, fever, chills, weakness or fatigue HEENT: Eyes: No change in vision               Ears, Nose, Throat:  No change in hearing or congestion Skin: No rash or itching Cardiovascular: No chest pain, chest pressure or palpitations   Respiratory: No SOB or cough Gastrointestinal: See HPI and otherwise negative Genitourinary: No dysuria or change in urinary frequency Neurological: No headache, dizziness or syncope Musculoskeletal: No new muscle or joint pain Hematologic: No bleeding or bruising Psychiatric: No history of depression or anxiety      Physical Exam:  Vital signs: BP 106/68   Pulse 84   Ht 5' 10 (1.778 m)   Wt 177 lb (80.3 kg)   SpO2  96%   BMI 25.40 kg/m    Constitutional:  Pleasant male appears to be in NAD, Well developed, Well nourished, alert and  cooperative Throat: Oral cavity and pharynx without inflammation, swelling or lesion.  Respiratory: Respirations even and unlabored. Lungs clear to auscultation bilaterally.   No wheezes, crackles, or rhonchi.  Cardiovascular: Normal S1, S2. Regular rate and rhythm. No peripheral edema, cyanosis or pallor.  Gastrointestinal:  Soft, nondistended, nontender. No rebound or guarding. Normal bowel sounds. No appreciable masses or hepatomegaly. Rectal:  Not performed.  Msk:  Symmetrical without gross deformities. Without edema, no deformity or joint abnormality.  Neurologic:  Alert and  oriented x4;  grossly normal neurologically.  Skin:   Dry and intact without significant lesions or rashes.   RELEVANT LABS AND IMAGING: CBC     Latest Ref Rng & Units 06/09/2024    2:44 PM 05/30/2024    2:18 PM 05/30/2024    2:11 PM  CBC  WBC 4.0 - 10.5 K/uL 6.5    5.1   Hemoglobin 13.0 - 17.0 g/dL 86.7  86.3  85.7   Hematocrit 39.0 - 52.0 % 39.1  40.0  41.2   Platelets 150 - 400 K/uL 145    129       CMP         Latest Ref Rng & Units 06/09/2024    2:44 PM 05/30/2024    2:18 PM 05/30/2024    2:11 PM  CMP  Glucose 70 - 99 mg/dL 79  93  97   BUN 8 - 23 mg/dL 15  11  10    Creatinine 0.61 - 1.24 mg/dL 8.95  8.79  8.82   Sodium 135 - 145 mmol/L 136  142  140   Potassium 3.5 - 5.1 mmol/L 3.6  3.9  3.9   Chloride 98 - 111 mmol/L 102  104  106   CO2 22 - 32 mmol/L 26    25   Calcium  8.9 - 10.3 mg/dL 8.6    8.8   Total Protein 6.5 - 8.1 g/dL     6.7   Total Bilirubin 0.0 - 1.2 mg/dL     1.2   Alkaline Phos 38 - 126 U/L     38   AST 15 - 41 U/L     29   ALT 0 - 44 U/L     28       Recent Labs       Lab Results  Component Value Date    TSH 0.665 03/03/2024     1/22 echo- Left ventricular ejection fraction, by estimation, is 60 to 65%.    08/2012 colonoscopy - overdue     Assessment:     Encounter Diagnoses  Name Primary?   Gastroesophageal reflux disease, unspecified whether esophagitis present  Yes   Altered bowel habits     Special screening for malignant neoplasms, colon     Loss of weight     Poor appetite       63 year old male Jon Gonzales due for colon screening colonoscopy, last colonoscopy was approximately in 2013.  Will go ahead and schedule colonoscopy with 2-day prep in [facility] with Dr. Charlanne.    Jon Gonzales also presents with poor appetite and weight loss of 10 pounds in the course of a week or 2.  Jon Gonzales has been on Wegovy  for over 2 years however has not had significant weight loss like current.  Jon Gonzales does have a history of GERD as well as  pyloric stenosis that was found on endoscopy several years ago.  Will go ahead and schedule upper GI endoscopy to evaluate.  Recommend Jon Gonzales drink protein drinks as tolerated.     Plan: - Continue Pantoprazole  40mg  po daily -GERD diet, no late meals -Add OTC Miralax po daily  - Schedule EGD in [facility] with Dr. Charlanne.  The risks and benefits of EGD with possible biopsies and esophageal dilation were discussed with the Jon Gonzales who agrees to proceed. -Schedule for a colonoscopy with 2 day prep in [facility] with Dr. Charlanne. The risks and benefits of colonoscopy with possible polypectomy / biopsies were discussed and the Jon Gonzales agrees to proceed.  -Cardiac clearance for Plavix   - Hold Wegovy  for 1 week prior to procedures   ADDENDUM: Records from [facility] EGD on 01/08/2007 for epigastric pain Impression multiple duodenal ulcers with stenosis/stricture of the distal first portion of the duodenum Mild gastritis Biopsy Small bowel biopsy Small bowel mucosa with architecturally normal villi Colonoscopy on 09/10/2012 Mild sigmoid diverticulosis Small internal hemorrhoids Otherwise normal colonoscopy     Thank you for the courtesy of this consult. Please call me with any questions or concerns.    Deanna May, FNP-C [facility]    Attending physician's note   I have taken history, reviewed the chart and examined the Jon Gonzales.  I performed a substantive portion of this encounter, including complete performance of at least one of the key components, in conjunction with the APP. I agree with the Advanced Practitioner's note, impression and recommendations.   For EGD/colon today   Anselm Charlanne, MD [facility] [phone]

## 2024-09-21 ENCOUNTER — Telehealth: Payer: Self-pay

## 2024-09-21 ENCOUNTER — Ambulatory Visit: Payer: Self-pay | Admitting: Cardiology

## 2024-09-21 ENCOUNTER — Other Ambulatory Visit (HOSPITAL_BASED_OUTPATIENT_CLINIC_OR_DEPARTMENT_OTHER): Payer: Self-pay

## 2024-09-21 NOTE — Telephone Encounter (Signed)
 Attempted to reach patient for follow up phone call. No answer, unable to leave voicemail due to busy line.

## 2024-09-22 ENCOUNTER — Telehealth: Payer: Self-pay

## 2024-09-22 LAB — SURGICAL PATHOLOGY

## 2024-09-22 NOTE — Telephone Encounter (Signed)
 Left message on My Chart with stress test results per Dr. Tonja Fray note. Routed to PCP.

## 2024-09-23 ENCOUNTER — Other Ambulatory Visit (HOSPITAL_BASED_OUTPATIENT_CLINIC_OR_DEPARTMENT_OTHER): Payer: Self-pay

## 2024-09-23 ENCOUNTER — Ambulatory Visit: Admitting: Neurology

## 2024-09-23 ENCOUNTER — Encounter: Payer: Self-pay | Admitting: Neurology

## 2024-09-23 VITALS — BP 117/79 | HR 73 | Ht 70.0 in | Wt 183.0 lb

## 2024-09-23 DIAGNOSIS — R6889 Other general symptoms and signs: Secondary | ICD-10-CM | POA: Insufficient documentation

## 2024-09-23 DIAGNOSIS — M6281 Muscle weakness (generalized): Secondary | ICD-10-CM | POA: Insufficient documentation

## 2024-09-23 DIAGNOSIS — R419 Unspecified symptoms and signs involving cognitive functions and awareness: Secondary | ICD-10-CM | POA: Insufficient documentation

## 2024-09-23 DIAGNOSIS — G4719 Other hypersomnia: Secondary | ICD-10-CM | POA: Diagnosis not present

## 2024-09-23 DIAGNOSIS — R413 Other amnesia: Secondary | ICD-10-CM

## 2024-09-23 DIAGNOSIS — G43809 Other migraine, not intractable, without status migrainosus: Secondary | ICD-10-CM

## 2024-09-23 MED ORDER — UBRELVY 100 MG PO TABS
ORAL_TABLET | ORAL | 1 refills | Status: AC
  Filled 2024-09-23: qty 27, 90d supply, fill #0

## 2024-09-23 MED ORDER — SUNOSI 150 MG PO TABS
150.0000 mg | ORAL_TABLET | ORAL | 1 refills | Status: AC
  Filled 2024-09-23 – 2024-10-14 (×3): qty 90, 90d supply, fill #0
  Filled 2024-11-16: qty 90, 90d supply, fill #1

## 2024-09-23 NOTE — Progress Notes (Signed)
 Provider:  Dedra Gores, MD  Primary Care Physician:  Nicholaus Credit, PA-C [address] [place] [address]     Referring Provider: Nicholaus Credit, Pa-c [address] [place],  [place] [address]          Chief Complaint according to patient   Patient presents with:                HISTORY OF PRESENT ILLNESS:  Jon Gonzales is a 63 y.o. male patient who is here for revisit 09/23/2024 for  memory decline-.    Chief concern according to patient :  memory loss progressed, according to patient and spouse.    Total  ESS=  20/ 24' FSS at 63/ 22   He feels he is unable to work full time. Too sleepy.   Sleeps more when bothered with vestibular migraines,   on Qulipta  for prevention, Ubrelvy  for acute migraine care. Sunosi  helped with sleepiness, and there is a notable difference if he missed it. 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 Balance is off.   He is sleepy, lost appetite, depression?  Felt weak and lost muscles mass - Was taken off Wegovy . Had ulcers , was seen by GI. Back surgery in summer 2025, was on narcotics for a short time, stayed on Cymbalta .   Echo pending,  he has a history of CAD, stents were placed before and after his Bypass surgery in at age 74.     Jon Gonzales is a 63 y.o. male and seen here upon referral from Dr. Nicholaus for a Consultation/ Evaluation of mental status changes, of spells that were at times looking like a stroke ( August 2024) and  in December had another episode as a code stroke. He is amnestic for the events. He has noted increased confusion of dates and days of the week  started about 2 years ago and much worsened since these occurred.  In December he was in hospital , possible postictal confusion, and had sudden and forceful isolated myoclonic movements, jerkin his arm up and almost slamming the head into the wall before  he vomited.  2 EEGs were read as normal. Memory care at [facility] neurology has seen him.  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  Comparison studies from 2020,2021 and 2023.    Lithium  levels are normal but balance is poor, has a Menieres dx. MCV / MCHhas been elevated for a long time ( years) tinnitus in both ears. Nausea and vertigo.  One time bilirubin elevated 10-03-2023.    ATN level was normal. Memory impairment is not due to AD.       09/23/2024   10:41 AM 11/07/2023   12:01 PM 09/21/2023    3:00 PM  Montreal Cognitive Assessment   Visuospatial/ Executive (0/5) 4 4 5   Naming (0/3) 3 3 3   Attention: Read list of digits (0/2) 2 2 2   Attention: Read list of letters (0/1) 1 1 1   Attention: Serial 7 subtraction starting at 100 (0/3) 3 2 1   Language: Repeat phrase (0/2) 1 2 1   Language : Fluency (0/1) 0 0 0  Abstraction (0/2) 2 2 1   Delayed Recall (0/5) 2 0 0  Orientation (0/6) 5 6 6   Total 23 22 20   Adjusted Score (based  on education)   20       Review of Systems: Out of a complete 14 system review, the patient complains of only the following symptoms, and all other reviewed systems are negative.:    Daily  Headaches and had taken Goody powders daily- no NSAIDS.  Has ULCERS>  Analgesic rebound. Vestibular migraines.  Has used 4 caffeinated bev a day ( 09-23-2024)  SLEEPINESS ?  How likely are you to doze in the following situations: 0 = not likely, 1 = slight chance, 2 = moderate chance, 3 = high chance  Sitting and Reading? Watching Television? Sitting inactive in a public place (theater or meeting)? Lying down in the afternoon when circumstances permit? Sitting and talking to someone? Sitting quietly after lunch without alcohol? In a car, while stopped for a few minutes in traffic? As a passenger in a car for an hour without a break?  Total  ESS=  20/ 24' FSS at ? N/A   He feels he is unable to work full time. Too sleep.    Sleeps more when bothered with vestibular migraines,   on Qulipta  for prevention, Ubrelvy  for acute migraine care. Sunosi  helped with sleepiness, and there is a notable difference if he missed it. 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 Balance is off.   He is sleepy, lost appetite, depression?  Felt weak and lost muscles mass - Was taken off Wegovy .      Social History   Socioeconomic History   Marital status: Married    Spouse name: Not on file   Number of children: Not on file   Years of education: 16   Highest education level: Bachelor's degree (e.g., BA, AB, BS)  Occupational History   Occupation: Oncologist: [employer]  Tobacco Use   Smoking status: Never   Smokeless tobacco: Never  Vaping Use   Vaping status: Never Used  Substance and Sexual Activity   Alcohol use: Yes    Alcohol/week: 14.0 standard drinks of alcohol    Types: 14 Cans of beer per week    Comment: every other day   Drug use: Never   Sexual activity: Not on file  Other Topics Concern   Not on file  Social History Narrative   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        Right Handed   Lives in two story home    Lives with wife   2 step children'   Social Drivers of Health   Financial Resource Strain: Low Risk  (06/04/2022)   Overall Financial Resource Strain (CARDIA)    Difficulty of Paying Living Expenses: Not hard at all  Food Insecurity: No Food Insecurity (06/04/2022)   Hunger Vital Sign    Worried About Running Out of Food in the Last Year: Never true    Ran Out of Food in the Last Year: Never true  Transportation Needs: No Transportation Needs (06/04/2022)   PRAPARE - Administrator, Civil Service (Medical): No    Lack of Transportation (Non-Medical): No  Physical Activity: Inactive (06/04/2022)    Exercise Vital Sign    Days of Exercise per Week: 0 days    Minutes of Exercise per Session: 0 min  Stress: No Stress Concern Present (06/04/2022)   [employer] of Occupational  Health - Occupational Stress Questionnaire    Feeling of Stress : Not at all  Social Connections: Moderately Integrated (06/04/2022)   Social Connection and Isolation Panel    Frequency of Communication with Friends and Family: More than three times a week    Frequency of Social Gatherings with Friends and Family: More than three times a week    Attends Religious Services: Never    Database Administrator or Organizations: Yes    Attends Engineer, Structural: More than 4 times per year    Marital Status: Married    Family History  Problem Relation Age of Onset   Aneurysm Mother 64       cerebral   Early death Mother    CAD Father 11   Early death Father    Heart disease Sister        Unclear   Healthy Brother    Dementia Other        several individuals on extended paternal side of family   Colon cancer Neg Hx    Esophageal cancer Neg Hx    Stomach cancer Neg Hx     Past Medical History:  Diagnosis Date   Abnormal EKG 06/13/2023   Abnormal stress test small area of mild ischemia inferior wall 05/24/2022   Aphasia 06/13/2023   Arthropathy of lumbar facet joint 07/03/2023   Bilateral sacroiliitis 08/29/2023   Bipolar 1 disorder 02/04/2023   Chronic bilateral low back pain with right-sided sciatica 06/24/2023   Chronic headaches 01/30/2021   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   Coronary artery disease involving coronary bypass graft of native heart with angina pectoris 05/22/2016   Cath 2013 LAD 95% stenosis, D1 90% stenosis, RCA 20%, Circ 30%. Previous stent to D1 2012. EF 60%.   Degeneration of lumbar intervertebral disc 07/03/2023   Dizziness 10/27/2020   Excessive daytime sleepiness 09/11/2016    Gastroesophageal reflux disease 02/04/2023   Generalized anxiety disorder 05/22/2016   Herpes zoster 01/20/2020   History of small bowel obstruction    Admitted 10-14-2007 for partical small bowel obstruction and N.G. tube was placed. Admitted by Dr Gerard. 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   Other fatigue 06/13/2023   Paronychia of finger, left 08/22/2020   PLMD (periodic limb movement disorder) 09/11/2016   PUD (peptic ulcer disease)    Syncope 06/13/2023   TIA (transient ischemic attack)    Tinnitus of both ears 06/26/2022   Tremor 02/04/2023    Past Surgical History:  Procedure Laterality Date   ANTERIOR LAT LUMBAR FUSION N/A 06/12/2024   Procedure: PRONE TRANSPOSOAS INTERBODY FUSION, LUMBAR TWO-LUMBAR THREE, POSTERIOR PERCUTANEOUS NON-SEGMENTAL  INSTRUMENTION, LUMBAR TWO-LUMBAR THREE;  Surgeon: Lanis Pupa, MD;  Location: [facility] OR;  Service: Neurosurgery;  Laterality: N/A;  Prone transpsoas interbody fusion, L2-3. Posterior percutaneous non-segmental instrumentation, L2-3   CARDIAC CATHETERIZATION  10/2011, 01/2020   CHOLECYSTECTOMY     COLONOSCOPY  09/10/2012   Mild  sigmoid diverticulosis. Small internal hemorrhoids. Otherwise normal colonoscopy to terminal ileum   CORONARY ARTERY BYPASS GRAFT  02/19/2012   L - LAD, SVG - D1, SVG - OM   CORONARY STENT INTERVENTION N/A 11/05/2019   Procedure: CORONARY STENT INTERVENTION;  Surgeon: Court Dorn PARAS,  MD;  Location: [facility] INVASIVE CV LAB;  Service: Cardiovascular;  Laterality: N/A;  SVG to DIAG   ESOPHAGOGASTRODUODENOSCOPY  01/08/2007   Multiple duodenal ulcers with stenosis/stricture of the distal first portion of the duodenum (measuring approximantely 9mm) Mild gastritis.   LEFT HEART CATH AND CORS/GRAFTS ANGIOGRAPHY N/A 11/05/2019   Procedure: LEFT HEART CATH AND CORS/GRAFTS ANGIOGRAPHY;  Surgeon: Court Dorn PARAS, MD;  Location: [facility];  Service: Cardiovascular;  Laterality: N/A;   LEFT HEART CATH AND CORS/GRAFTS ANGIOGRAPHY N/A 08/29/2023   Procedure: LEFT HEART CATH AND CORS/GRAFTS ANGIOGRAPHY;  Surgeon: Verlin Lonni BIRCH, MD;  Location: [facility];  Service: Cardiovascular;  Laterality: N/A;   LUMBAR LAMINECTOMY/DECOMPRESSION MICRODISCECTOMY Left 02/14/2024   Procedure: LUMBAR LAMINECTOMY/DECOMPRESSION MICRODISCECTOMY LEFT LUMBAR TWO-LUMBAR THREE;  Surgeon: Lanis Pupa, MD;  Location: [facility] OR;  Service: Neurosurgery;  Laterality: Left;  MICRODISCECTOMY, LT, L23   RENAL ANGIOGRAPHY N/A 11/05/2019   Procedure: RENAL ANGIOGRAPHY;  Surgeon: Court Dorn PARAS, MD;  Location: [facility];  Service: Cardiovascular;  Laterality: N/A;   TONSILLECTOMY AND ADENOIDECTOMY     WRIST SURGERY Right      Current Outpatient Medications on File Prior to Visit  Medication Sig Dispense Refill   aspirin  EC (ASPIRIN  LOW DOSE) 81 MG tablet Take 1 tablet (81 mg total) by mouth daily. 90 tablet 2   Atogepant  (QULIPTA ) 60 MG TABS 1/2 tablet po qd     cariprazine  (VRAYLAR ) 3 MG capsule Take 1 capsule (3 mg total) by mouth daily. 90 capsule 0   clopidogrel  (PLAVIX ) 75 MG tablet Take 1 tablet (75 mg total) by mouth daily. 90 tablet 1   DULoxetine  (CYMBALTA ) 60 MG capsule Take 1 capsule (60 mg total) by mouth 2 (two) times daily. 180 capsule 1   ezetimibe  (ZETIA ) 10 MG tablet Take 1 tablet (10 mg total) by mouth daily. 90 tablet 1   fexofenadine  (ALLEGRA ) 180 MG tablet Take 180  mg by mouth daily.     isosorbide  mononitrate (IMDUR ) 60 MG 24 hr tablet Take 1 tablet (60 mg total) by mouth daily. 30 tablet 2   LORazepam  (ATIVAN ) 1 MG tablet Take 1 tablet (1 mg total) by mouth every 8 (eight) hours as needed for anxiety. 60 tablet 0   meclizine  (ANTIVERT ) 25 MG tablet Take 1 tablet (25 mg total) by mouth 3 (three) times daily as needed for dizziness. 90 tablet 2   metoprolol  succinate (TOPROL  XL) 25 MG 24 hr tablet Take 1 tablet (25 mg total) by mouth daily. 30 tablet 2   Multiple Vitamin (MULTIVITAMIN WITH MINERALS) TABS tablet Take 1 tablet by mouth daily. Men's Multivitamin     nitroGLYCERIN  (NITROSTAT ) 0.4 MG SL tablet Dissolve 1 tablet (0.4 mg total) under the tongue every 5 (five) minutes up to 3 doses as needed for chest pain. If no relief after 3 doses call 911. 25 tablet 1   ondansetron  (ZOFRAN ) 4 MG tablet Take 1 tablet (4 mg total) by mouth every 8 (eight) hours as needed for nausea or vomiting. 30 tablet 0   pantoprazole  (PROTONIX ) 40 MG tablet Take one tablet twice a day for 8 weeks and then decrease to once daily 90 tablet 3  ranolazine  (RANEXA ) 1000 MG SR tablet Take 1 tablet by mouth 2 times daily. 180 tablet 3   rosuvastatin  (CRESTOR ) 40 MG tablet Take 1 tablet (40 mg total) by mouth daily. 90 tablet 1   Solriamfetol  HCl (SUNOSI ) 150 MG TABS Take 1 tablet (150 mg total) by mouth every morning. Please keep 09/23/24 appointment for further refills 30 tablet 2   Ubrogepant  (UBRELVY ) 100 MG TABS 1 po at onset of dizziness/headache.  May repeat once in 2 hours.  Max dose 200mg  qd 30 tablet 1   [DISCONTINUED] pantoprazole  (PROTONIX ) 40 MG tablet Take 1 tablet (40 mg total) by mouth daily. 30 tablet 3   [DISCONTINUED] triamterene -hydrochlorothiazide  (DYAZIDE) 37.5-25 MG capsule Take 1 each (1 capsule total) by mouth daily. 90 capsule 2   No current facility-administered medications on file prior to visit.    Allergies  Allergen Reactions   Penicillins Rash    Trintellix [Vortioxetine] Other (See Comments)    Headaches.   IMPRESSION:10-16-2016 :    Mild Obstructive Sleep Apnea(OSA) REM sleep and supine sleep position dependent. Primary Snoring, moderate No parasomnia activity.    RECOMMENDATIONS:   Given the high degree of daytime sleepiness and fatigue, I would consider Mr. Shaddix a candidate for a CPAP therapy. If his sleepiness is not improved after 30-60 days of CPAP treatment, an evaluation for narcolepsy will follow. I will order a CPAP titration study.    Avoid supine sleep if possible, consider using a tennis ball.  Avoid sedative-hypnotics which may worsen sleep apnea, alcohol and tobacco (as applicable). Advise to lose weight, diet and exercise if not contraindicated (BMI 32). Dental device or ENT procedures can improve mild apnea and snoring- as clinically indicated . Further information regarding OSA may be obtained from [place] (www.sleepfoundation.org) or American Sleep Apnea Association (www.sleepapnea.org). A follow up appointment will be scheduled in the Sleep Clinic at [facility]. The referring provider will be notified of the results.   DIAGNOSTIC DATA (LABS, IMAGING, TESTING) - I reviewed patient records, labs, notes, testing and imaging myself where available.  Lab Results  Component Value Date   WBC 6.0 08/24/2024   HGB 15.5 08/24/2024   HCT 46.3 08/24/2024   MCV 101 (H) 08/24/2024   PLT 167 08/24/2024      Component Value Date/Time   NA 140 08/24/2024 0852   K 3.9 08/24/2024 0852   CL 103 08/24/2024 0852   CO2 20 08/24/2024 0852   GLUCOSE 96 08/24/2024 0852   GLUCOSE 79 06/09/2024 1444   BUN 16 08/24/2024 0852   CREATININE 1.43 (H) 08/24/2024 0852   CALCIUM  9.4 08/24/2024 0852   PROT 6.4 08/24/2024 0852   ALBUMIN  3.9 08/24/2024 0852   AST 27 08/24/2024 0852   ALT 43 08/24/2024 0852   ALKPHOS 62 08/24/2024 0852   BILITOT 0.6 08/24/2024 0852   GFRNONAA >60 06/09/2024 1444    GFRAA 77 11/15/2020 0809   Lab Results  Component Value Date   CHOL 127 03/03/2024   HDL 55 03/03/2024   LDLCALC 54 03/03/2024   TRIG 97 03/03/2024   CHOLHDL 2.3 03/03/2024   Lab Results  Component Value Date   HGBA1C 5.2 08/24/2024   Lab Results  Component Value Date   VITAMINB12 665 08/24/2024   Lab Results  Component Value Date   TSH 1.930 08/24/2024    PHYSICAL EXAM:  Vitals:   09/23/24 1038  BP: 117/79  Pulse: 73   No data found. Body mass index is 26.26  kg/m.   Wt Readings from Last 3 Encounters:  09/23/24 183 lb (83 kg)  09/18/24 177 lb (80.3 kg)  09/16/24 180 lb (81.6 kg)     Ht Readings from Last 3 Encounters:  09/23/24 5' 10 (1.778 m)  09/18/24 5' 10 (1.778 m)  09/16/24 5' 10 (1.778 m)      General: The patient is awake, alert and appears not in acute distress and groomed. Head: Normocephalic, atraumatic.  Neck is supple.    NEUROLOGIC EXAM: The patient is awake and alert, oriented to place and time.   Memory subjective described as intact.  Attention span & concentration ability appears normal.   Speech is fluent,  without  dysarthria, dysphonia or aphasia.  Mood and affect are appropriate.   Neurological Examination: Mental Status: Intact. Language and speech are normal. No cognitive deficits. Cranial Nerves II-XII: Intact. PERL. EOMI. VFF. No nystagmus.  No facial droop.  No ptosis.  Hearing is grossly intact bilaterally.  The tongue is normal and midline. Motor: Strengths are 5/5 throughout. Muscle bulk and tone are normal. No tremors.  Coordination: No ataxia or dysmetria.  Sensory: Grossly intact throughout to all modalities. Reflexes: Normal and symmetric throughout. No ankle clonus. Babinski's sign is absent bilaterally. Hoffman's sign is absent bilaterally. Gait and Station: Normal. Romberg's sign is absent.   ASSESSMENT AND PLAN :   63 y.o. year old male  here with:    1) EDS - Excessive daytime sleepiness:  I  suspect this is not narcolepsy or no alone . Depression is likely a major factor.  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  I start with Sunosis refills and order a HST to look again for apnea, if no apnea will start PSG and MSLT, if apnea , will need to use CPAP.   Sunosi  for 90 days.    2) Headaches with vestibular migraines , headaches of analgesic rebound on Goodys.  Can't take more NSAIDS and can't take steroids.  Can't mix Tryptiline with SSRI.  Will be having a miserable 4 weeks.    3) Near syncope, but normal BP here, at home he had 96/ 80 mmHg  and some spells of  blinking out.    4)vest migraines, : keep hydrating ,  cannot use triptans with CAD- on Ubrelvy , Qlipta.     I would like to thank Nicholaus Credit, DEVONNA and Nicholaus [employer], Pa-c [address] [place],  [place] [address] for allowing me to meet with this pleasant patient.   Sleep Clinic Patients are generally offered input on sleep hygiene, life style changes and how to improve compliance with medical treatment where applicable. Review and reiteration of good sleep hygiene measures is offered to any sleep clinic patient, be it in the first consultation or with any follow up visits.    Any patient with sleepiness should be cautioned not to drive, work at heights, or operate dangerous or heavy equipment when feeling tired or sleepy.      The patient will be seen in follow-up in the sleep clinic at [facility] for discussion of test results, sleep related symptoms and treatment compliance review, further management strategies, etc.   The referring provider will be notified of the test results.   The patient's condition requires frequent monitoring and adjustments in the treatment plan, reflecting the ongoing complexity of care.  This provider is the continuing focal point for all needed services for this condition.  After spending a total time of  35  minutes face to face and time for  history taking, physical and  neurologic examination, review of laboratory studies,  personal review of imaging studies, reports and results of other testing and review of referral information / records as far as provided in visit,   Electronically signed by: Dedra Gores, MD 09/23/2024 11:06 AM  [facility] Board certified by The [employer] of Sleep Medicine and Diplomate of the [employer] of Sleep Medicine. Board certified In Neurology through the ABPN, Fellow of the [employer] of Neu[employer].

## 2024-09-23 NOTE — Patient Instructions (Addendum)
 ASSESSMENT AND PLAN :    63 y.o. year old male  here with:     1) EDS - Excessive daytime sleepiness:  I suspect this is not narcolepsy or no alone . Depression is likely a major factor.  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  I start with Sunosis refills and order a HST to look again for apnea, if no apnea will start PSG and MSLT, if apnea , will need to use CPAP.    Sunosi  for 90 days.      2) Headaches with vestibular migraines , headaches of analgesic rebound on Goodys.  Can't take more NSAIDS and can't take steroids.  Can't mix Tryptiline with SSRI.  Will be having a miserable 4 weeks.      3) Near syncope, but normal BP here, at home he had 96/ 80 mmHg  and some spells of  blinking out.      4)vest migraines, : keep hydrating ,  cannot use triptans with CAD- on Ubrelvy , Qlipta.     Rv in 4 months.  After HST and PSG/ MSLT to follow if apnea negative.

## 2024-09-25 DIAGNOSIS — R2689 Other abnormalities of gait and mobility: Secondary | ICD-10-CM | POA: Diagnosis not present

## 2024-09-25 DIAGNOSIS — M6281 Muscle weakness (generalized): Secondary | ICD-10-CM | POA: Diagnosis not present

## 2024-09-25 DIAGNOSIS — R2681 Unsteadiness on feet: Secondary | ICD-10-CM | POA: Diagnosis not present

## 2024-09-26 ENCOUNTER — Ambulatory Visit: Payer: Self-pay | Admitting: Gastroenterology

## 2024-09-29 ENCOUNTER — Telehealth: Payer: Self-pay | Admitting: Physician Assistant

## 2024-09-29 ENCOUNTER — Ambulatory Visit: Admitting: Cardiology

## 2024-09-29 NOTE — Telephone Encounter (Signed)
 [employer]

## 2024-09-30 DIAGNOSIS — R2681 Unsteadiness on feet: Secondary | ICD-10-CM | POA: Diagnosis not present

## 2024-09-30 DIAGNOSIS — M6281 Muscle weakness (generalized): Secondary | ICD-10-CM | POA: Diagnosis not present

## 2024-09-30 DIAGNOSIS — R2689 Other abnormalities of gait and mobility: Secondary | ICD-10-CM | POA: Diagnosis not present

## 2024-10-01 ENCOUNTER — Other Ambulatory Visit: Payer: Self-pay

## 2024-10-01 ENCOUNTER — Ambulatory Visit

## 2024-10-01 ENCOUNTER — Encounter: Payer: Self-pay | Admitting: Physician Assistant

## 2024-10-01 ENCOUNTER — Ambulatory Visit: Admitting: Physician Assistant

## 2024-10-01 VITALS — BP 100/70 | HR 82 | Temp 98.3°F | Resp 18 | Ht 70.0 in | Wt 178.0 lb

## 2024-10-01 DIAGNOSIS — K279 Peptic ulcer, site unspecified, unspecified as acute or chronic, without hemorrhage or perforation: Secondary | ICD-10-CM

## 2024-10-01 DIAGNOSIS — R42 Dizziness and giddiness: Secondary | ICD-10-CM

## 2024-10-01 DIAGNOSIS — I2510 Atherosclerotic heart disease of native coronary artery without angina pectoris: Secondary | ICD-10-CM

## 2024-10-01 DIAGNOSIS — R5383 Other fatigue: Secondary | ICD-10-CM

## 2024-10-01 DIAGNOSIS — F411 Generalized anxiety disorder: Secondary | ICD-10-CM | POA: Diagnosis not present

## 2024-10-01 DIAGNOSIS — G43809 Other migraine, not intractable, without status migrainosus: Secondary | ICD-10-CM | POA: Diagnosis not present

## 2024-10-01 NOTE — Progress Notes (Signed)
 Subjective:  Patient ID: Jon Gonzales, male    DOB: 1961-05-14  Age: 63 y.o. MRN: [id number]  Chief Complaint  Patient presents with   Dizziness    Follow up    Dizziness  Pt in today for follow up of chronic medical issues Since last visit pt did follow up with neurology for his chronic dizziness and intermittent spells of vestibular migraine symptoms (dizzy, decreased focus, decreased concentration, mild confusion, slurring speech, and fatigue) He states these symptoms happen about 1-2 times weekly.  He has decided to take a medical leave of absence from work beginning 10/16/24 as he tries to manage and treat these issues At this time he continues of sunosi  for fatigue, meclizine  for dizziness as needed and also quilipta and ubrelvy  for vestibular migraines Pt is feeling well today with no symptoms  Pt has seen GI and had colonoscopy and endoscopy a few weeks ago.  He had a colon polyp removed which was negative for dysplasia.  EGD showed chronic peptic duodenitis.  He continues on protonix  and has stopped use of Goody powders  Pt had follow up with cardiology 2 weeks ago.  He had an exercise stress test which was normal and an echocardiogram is scheduled for 10/19/24 Because of his CAD and atherosclerosis he continues on ASA 81mg  qd , zetia  10mg  and crestor  40mg  qd - will obtain LpA serology today Pt also continues to take plavix  75mg  qd, imdur , toprol  and ranexa   Pt is overdue for follow up with psychiatry and will make an appt - pt with diagnosis of bipolar 1 disorder with depressive symptoms - currently stable on medications - vraylar , cymbalta , ativan     03/18/2024   11:19 AM 02/28/2024   11:31 AM 06/11/2023    3:30 PM 04/18/2022   10:08 PM 01/17/2022    4:14 PM  Depression screen PHQ 2/9  Decreased Interest 3 2 1 3 3   Down, Depressed, Hopeless 3 1 2 3 2   PHQ - 2 Score 6 3 3 6 5   Altered sleeping 2 1 0 3 3  Tired, decreased energy 1 1 0 3 1  Change in appetite 0 0 0 2 1  Feeling  bad or failure about yourself  3 1 1 3 3   Trouble concentrating 3 2 3 2 2   Moving slowly or fidgety/restless 1 1 0 0 0  Suicidal thoughts 1 1 2 1 1   PHQ-9 Score 17  10  9  20  16    Difficult doing work/chores Very difficult Somewhat difficult Somewhat difficult Somewhat difficult      Data saved with a previous flowsheet row definition        08/28/2022    1:33 PM 09/18/2022    9:35 AM 06/11/2023    3:30 PM 09/20/2023    1:31 PM 03/18/2024   11:18 AM  Fall Risk  Falls in the past year? 0 0 1 0 1  Was there an injury with Fall? 0  0  0  0  0   Fall Risk Category Calculator 0 0 1 0 2  Fall Risk Category (Retired) Low  Low      (RETIRED) Patient Fall Risk Level Low fall risk  Low fall risk      Patient at Risk for Falls Due to  No Fall Risks No Fall Risks  History of fall(s);No Fall Risks  Fall risk Follow up Falls evaluation completed  Falls evaluation completed  Falls evaluation completed  Falls evaluation completed  Data saved with a previous flowsheet row definition     CONSTITUTIONAL: Negative for chills, fatigue, fever, unintentional weight gain and unintentional weight loss.  E/N/T: Negative for ear pain, nasal congestion and sore throat.  CARDIOVASCULAR: Negative for chest pain, dizziness, palpitations and pedal edema.  RESPIRATORY: Negative for recent cough and dyspnea.  GASTROINTESTINAL: Negative for abdominal pain, acid reflux symptoms, constipation, diarrhea, nausea and vomiting.  MSK: Negative for arthralgias and myalgias.  INTEGUMENTARY: Negative for rash.  NEUROLOGICAL: see HPI PSYCHIATRIC: see HPI      Current Outpatient Medications:    aspirin  EC (ASPIRIN  LOW DOSE) 81 MG tablet, Take 1 tablet (81 mg total) by mouth daily., Disp: 90 tablet, Rfl: 2   Atogepant  (QULIPTA ) 60 MG TABS, 1/2 tablet po qd, Disp: , Rfl:    cariprazine  (VRAYLAR ) 3 MG capsule, Take 1 capsule (3 mg total) by mouth daily., Disp: 90 capsule, Rfl: 0   clopidogrel  (PLAVIX ) 75 MG tablet, Take 1  tablet (75 mg total) by mouth daily., Disp: 90 tablet, Rfl: 1   DULoxetine  (CYMBALTA ) 60 MG capsule, Take 1 capsule (60 mg total) by mouth 2 (two) times daily., Disp: 180 capsule, Rfl: 1   ezetimibe  (ZETIA ) 10 MG tablet, Take 1 tablet (10 mg total) by mouth daily., Disp: 90 tablet, Rfl: 1   fexofenadine  (ALLEGRA ) 180 MG tablet, Take 180 mg by mouth daily., Disp: , Rfl:    isosorbide  mononitrate (IMDUR ) 60 MG 24 hr tablet, Take 1 tablet (60 mg total) by mouth daily., Disp: 30 tablet, Rfl: 2   LORazepam  (ATIVAN ) 1 MG tablet, Take 1 tablet (1 mg total) by mouth every 8 (eight) hours as needed for anxiety., Disp: 60 tablet, Rfl: 0   meclizine  (ANTIVERT ) 25 MG tablet, Take 1 tablet (25 mg total) by mouth 3 (three) times daily as needed for dizziness., Disp: 90 tablet, Rfl: 2   metoprolol  succinate (TOPROL  XL) 25 MG 24 hr tablet, Take 1 tablet (25 mg total) by mouth daily., Disp: 30 tablet, Rfl: 2   Multiple Vitamin (MULTIVITAMIN WITH MINERALS) TABS tablet, Take 1 tablet by mouth daily. Men's Multivitamin, Disp: , Rfl:    nitroGLYCERIN  (NITROSTAT ) 0.4 MG SL tablet, Dissolve 1 tablet (0.4 mg total) under the tongue every 5 (five) minutes up to 3 doses as needed for chest pain. If no relief after 3 doses call 911., Disp: 25 tablet, Rfl: 1   ondansetron  (ZOFRAN ) 4 MG tablet, Take 1 tablet (4 mg total) by mouth every 8 (eight) hours as needed for nausea or vomiting., Disp: 30 tablet, Rfl: 0   pantoprazole  (PROTONIX ) 40 MG tablet, Take one tablet twice a day for 8 weeks and then decrease to once daily, Disp: 90 tablet, Rfl: 3   ranolazine  (RANEXA ) 1000 MG SR tablet, Take 1 tablet by mouth 2 times daily., Disp: 180 tablet, Rfl: 3   rosuvastatin  (CRESTOR ) 40 MG tablet, Take 1 tablet (40 mg total) by mouth daily., Disp: 90 tablet, Rfl: 1   Solriamfetol  HCl (SUNOSI ) 150 MG TABS, Take 1 tablet (150 mg total) by mouth every morning. Please keep 09/23/24 appointment for further refills, Disp: 90 tablet, Rfl: 1    Ubrogepant  (UBRELVY ) 100 MG TABS, Take 1 tablet by mouth  at onset of dizziness/headache.  May repeat once in 2 hours.  Max dose 200mg  daily, Disp: 90 tablet, Rfl: 1  Past Medical History:  Diagnosis Date   Abnormal EKG 06/13/2023   Abnormal stress test small area of mild ischemia inferior wall 05/24/2022  Aphasia 06/13/2023   Arthropathy of lumbar facet joint 07/03/2023   Bilateral sacroiliitis 08/29/2023   Bipolar 1 disorder 02/04/2023   Chronic bilateral low back pain with right-sided sciatica 06/24/2023   Chronic headaches 01/30/2021   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   Coronary artery disease involving coronary bypass graft of native heart with angina pectoris 05/22/2016   Cath 2013 LAD 95% stenosis, D1 90% stenosis, RCA 20%, Circ 30%. Previous stent to D1 2012. EF 60%.   Degeneration of lumbar intervertebral disc 07/03/2023   Dizziness 10/27/2020   Excessive daytime sleepiness 09/11/2016   Gastroesophageal reflux disease 02/04/2023   Generalized anxiety disorder 05/22/2016   Herpes zoster 01/20/2020   History of small bowel obstruction    Admitted 10-14-2007 for partical small bowel obstruction and N.G. tube was placed. Admitted by Dr Gerard. 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   Other fatigue 06/13/2023   Paronychia of finger, left 08/22/2020   PLMD (periodic limb  movement disorder) 09/11/2016   PUD (peptic ulcer disease)    Syncope 06/13/2023   TIA (transient ischemic attack)    Tinnitus of both ears 06/26/2022   Tremor 02/04/2023   Objective:  PHYSICAL EXAM:   VS: BP 100/70   Pulse 82   Temp 98.3 F (36.8 C) (Temporal)   Resp 18   Ht 5' 10 (1.778 m)   Wt 178 lb (80.7 kg)   SpO2 96%   BMI 25.54 kg/m   GEN: Well nourished, well developed, in no acute distress   Cardiac: RRR; no murmurs, rubs, or gallops,no edema -  Respiratory:  normal respiratory rate and pattern with no distress - normal breath sounds with no rales, rhonchi, wheezes or rubs MS: no deformity or atrophy  Skin: warm and dry, no rash  Neuro:  Alert and Oriented x 3, - CN II-Xii grossly intact Psych: euthymic mood, appropriate affect and demeanor  Assessment & Plan:    Dizziness Continue meclizine  as needed Follow up with neurology as directed Vestibular migraine -     Qulipta ; 1/2 tablet po qd - try preventive at lower dose Use ubrelvy  as needed Coronary artery disease involving native coronary artery of native heart without angina pectoris Continue current meds as directed LPA pending Follow up with cardiology as directed Other fatigue Gait instability Bipolar 1 disorder with depression Excessive daytime sleepiness Follow up with neurology as directed  Recommend pt to take short term leave from work due to trouble focusing, excessive daytime somnolence , gait instability Follow up with all specialists as directed     Follow-up: Return in about 3 months (around 12/30/2024) for chronic fasting follow-up.  An After Visit Summary was printed and given to the patient.  [facility] [phone]

## 2024-10-02 DIAGNOSIS — M6281 Muscle weakness (generalized): Secondary | ICD-10-CM | POA: Diagnosis not present

## 2024-10-02 DIAGNOSIS — R2681 Unsteadiness on feet: Secondary | ICD-10-CM | POA: Diagnosis not present

## 2024-10-02 DIAGNOSIS — R2689 Other abnormalities of gait and mobility: Secondary | ICD-10-CM | POA: Diagnosis not present

## 2024-10-02 LAB — LIPOPROTEIN A (LPA): Lipoprotein (a): 65.4 nmol/L

## 2024-10-03 ENCOUNTER — Ambulatory Visit: Payer: Self-pay | Admitting: Physician Assistant

## 2024-10-03 ENCOUNTER — Other Ambulatory Visit: Payer: Self-pay | Admitting: Physician Assistant

## 2024-10-03 MED ORDER — TRAMADOL HCL 50 MG PO TABS: 50.0000 mg | ORAL_TABLET | Freq: Four times a day (QID) | ORAL | 0 refills | Status: AC | PRN

## 2024-10-05 ENCOUNTER — Other Ambulatory Visit: Payer: Self-pay

## 2024-10-05 ENCOUNTER — Other Ambulatory Visit (HOSPITAL_COMMUNITY): Payer: Self-pay

## 2024-10-06 ENCOUNTER — Other Ambulatory Visit: Payer: Self-pay

## 2024-10-07 ENCOUNTER — Other Ambulatory Visit: Payer: Self-pay | Admitting: Physician Assistant

## 2024-10-07 MED ORDER — PREDNISONE 50 MG PO TABS: 50.0000 mg | ORAL_TABLET | Freq: Every day | ORAL | 0 refills | Status: AC

## 2024-10-09 DIAGNOSIS — R2681 Unsteadiness on feet: Secondary | ICD-10-CM | POA: Diagnosis not present

## 2024-10-09 DIAGNOSIS — R2689 Other abnormalities of gait and mobility: Secondary | ICD-10-CM | POA: Diagnosis not present

## 2024-10-09 DIAGNOSIS — M6281 Muscle weakness (generalized): Secondary | ICD-10-CM | POA: Diagnosis not present

## 2024-10-11 ENCOUNTER — Encounter: Payer: Self-pay | Admitting: Physician Assistant

## 2024-10-11 ENCOUNTER — Other Ambulatory Visit: Payer: Self-pay

## 2024-10-11 ENCOUNTER — Other Ambulatory Visit: Payer: Self-pay | Admitting: Psychiatry

## 2024-10-11 DIAGNOSIS — R112 Nausea with vomiting, unspecified: Secondary | ICD-10-CM

## 2024-10-11 DIAGNOSIS — F411 Generalized anxiety disorder: Secondary | ICD-10-CM

## 2024-10-11 DIAGNOSIS — I25118 Atherosclerotic heart disease of native coronary artery with other forms of angina pectoris: Secondary | ICD-10-CM

## 2024-10-11 MED ORDER — NITROGLYCERIN 0.4 MG SL SUBL
0.4000 mg | SUBLINGUAL_TABLET | SUBLINGUAL | 1 refills | Status: AC | PRN
  Filled 2024-10-11: qty 25, 1d supply, fill #0

## 2024-10-11 MED ORDER — ONDANSETRON HCL 4 MG PO TABS
4.0000 mg | ORAL_TABLET | Freq: Three times a day (TID) | ORAL | 0 refills | Status: AC | PRN
  Filled 2024-10-11: qty 30, 10d supply, fill #0

## 2024-10-12 ENCOUNTER — Other Ambulatory Visit (HOSPITAL_BASED_OUTPATIENT_CLINIC_OR_DEPARTMENT_OTHER): Payer: Self-pay

## 2024-10-13 MED ORDER — LORAZEPAM 1 MG PO TABS
1.0000 mg | ORAL_TABLET | Freq: Three times a day (TID) | ORAL | 0 refills | Status: DC | PRN
  Filled 2024-10-13 – 2024-10-14 (×2): qty 15, 5d supply, fill #0

## 2024-10-14 ENCOUNTER — Other Ambulatory Visit (HOSPITAL_BASED_OUTPATIENT_CLINIC_OR_DEPARTMENT_OTHER): Payer: Self-pay

## 2024-10-14 ENCOUNTER — Other Ambulatory Visit: Payer: Self-pay

## 2024-10-14 ENCOUNTER — Other Ambulatory Visit (HOSPITAL_COMMUNITY): Payer: Self-pay

## 2024-10-19 ENCOUNTER — Ambulatory Visit: Attending: Cardiology

## 2024-10-19 DIAGNOSIS — R0609 Other forms of dyspnea: Secondary | ICD-10-CM

## 2024-10-19 LAB — ECHOCARDIOGRAM COMPLETE
AR max vel: 3.06 cm2
AV Area VTI: 3.04 cm2
AV Area mean vel: 2.8 cm2
AV Mean grad: 2 mmHg
AV Peak grad: 3.7 mmHg
Ao pk vel: 0.97 m/s
Area-P 1/2: 3.01 cm2
MV VTI: 1.89 cm2
S' Lateral: 2.9 cm

## 2024-10-20 ENCOUNTER — Telehealth: Payer: Self-pay

## 2024-10-20 NOTE — Telephone Encounter (Signed)
 Left message on My Chart with Echo results per Dr. Karry note. Routed to PCP.

## 2024-10-28 ENCOUNTER — Other Ambulatory Visit (HOSPITAL_BASED_OUTPATIENT_CLINIC_OR_DEPARTMENT_OTHER): Payer: Self-pay

## 2024-10-28 ENCOUNTER — Encounter: Payer: Self-pay | Admitting: Psychiatry

## 2024-10-28 ENCOUNTER — Ambulatory Visit (INDEPENDENT_AMBULATORY_CARE_PROVIDER_SITE_OTHER): Admitting: Psychiatry

## 2024-10-28 DIAGNOSIS — F4001 Agoraphobia with panic disorder: Secondary | ICD-10-CM | POA: Diagnosis not present

## 2024-10-28 DIAGNOSIS — F411 Generalized anxiety disorder: Secondary | ICD-10-CM

## 2024-10-28 DIAGNOSIS — F314 Bipolar disorder, current episode depressed, severe, without psychotic features: Secondary | ICD-10-CM

## 2024-10-28 MED ORDER — SERTRALINE HCL 50 MG PO TABS
50.0000 mg | ORAL_TABLET | Freq: Every day | ORAL | 0 refills | Status: AC
  Filled 2024-10-28: qty 90, 90d supply, fill #0

## 2024-10-28 NOTE — Progress Notes (Signed)
 Jon Gonzales [id number] 1961/01/04 64 y.o.  Subjective:   Patient ID:  Jon Gonzales is a 64 y.o. (DOB 11/15/1960) male.  Chief Complaint:  Chief Complaint  Patient presents with   Follow-up   Depression   Anxiety    HPI Jon Gonzales presents to the office today for follow-up of bipolar 1, generalized anxiety disorder, panic disorder, pseudodementia.  First seen in this office 06/21/2023.  He was encouraged to stop cyclobenzaprine  to see if concentration and memory might be better and start Vraylar  1.5 mg for 10 days and if no improvement in depression increase to 3 mg daily.  Wife is Dr. Josette Deshazo who reports that his depression and anxiety are improved but his memory is not.  She notes resolution of suicidal ideation. He agrees with wife's observation.   He says maybe back is a little worse.  Is off cyclobenzaprine .  Psych meds:  duloxetine  120 mg daily, lithium  300 BID, lorazepam  1 mg BID and prn, Vraylar  3 mg daily. No SE known.   Vraylar  seemed to work pretty good.  Less anxious and keyed up.  Dep is lighter.  Dep is 3/10 and was 8/10 before.  No SI.  Definitely a change.   Still having the memory problems.  Not sure if he's on Robaxin .  Didn't recognize he had an appt until he got here today.   No problems with work.  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 To help sleep takes OTC , melatonin.   Sleep good usually.  No Sleep apnea and neg sleep study per Dr. Gailen. No recent panic or Meniere's attacks lately.   Plan: (NAC) N-Acetylcysteine 2 of the  600 mg capsules daily to help with mild cognitive problems.  Try reducing the lorazepam  morning dose only to 1/2 tablet to see if memory is any better.     09/19/23 appt noted: Psych med:  duloxetine  120 mg daily, lithium  300 BID(out for a week), lorazepam  1 mg BID and prn, Vraylar  3 mg daily.  Forgot about NAC Didn't try reducing lorazepam  .  Was nervous about doing so. He thinks he's doing  pretty well.  Dep is under control.   Anxiety is still there but maybe not as bad. Function pretty good aside from back pain.   Sleep well. Enjoyed T'giving with sister in [place].  03/02/24 appt noted: Back surgery 2 week ago and pain better than it was. Med: duloxetine  60 mg daily,  no lithium , lorazepam  1 mg prn (avg 1 daily), Vraylar  3 mg daily.   Thinks Vraylar  a world of difference . Would like to increase duloxetine  bc seemed to help mood and anxiety more.   No SE noted. Will feel stressed out and tense and on edge if misses meds. Working to switch meds to pill pack.  M died when 64 yo.  So M's day is downer.   Mood overall is ok with depression. Dont' worry about job like he did. No mood swings  10/28/24 appt noted:  Med: duloxetine  120 mg daily,  no lithium , lorazepam  1 mg BID (avg 2 daily), Vraylar  3 mg daily. Doing good overall. On medical leave for vestibular migraine with vertigo.  For 2 weeks. Seeing [facility].  Stress reduced from work absence.  Additional work load from [employer].  Harder to manage the work load.  Rx Ubrelvy .   He would like to continue meds as they are. Anxiety and dep managed except for work  stress.  Feels sharper with things since out of work with less stress.  Over the last few months tended to have more stress than dep but some of both. No SE   Past Psychiatric Medication Trials:   Wellbutrin  XL 150 ? Anxiety Antidepressants tried around 2008- 2012 include sertraline , Lexapro headache, Effexor XR, Pristiq.,  Nefazodone 50 mg twice daily, Trintellix for 2 months 2014 with headache Duloxetine  120 mg daily Clonazepam December 2020 to January 2021, lorazepam  for years Lamotrigine for 2 months 2012, topiramate, Trileptal briefly 2018 Latuda 5 months 2018 with no apparent response and questionable dose Vraylar  3 mg daily August 2000 01 July 2016 Olanzapine 15 1 month September 2017 Buspirone 30 twice daily no response Mirtazapine May to  December 2018 Ambien, Lunesta  Cyproheptadine  No history bipolar but 2017 onset mania with rapid speech and drivenness and paranoia. No further manic sx   GAD-7    Flowsheet Row Office Visit from 03/18/2024 in [facility] Office Visit from 02/28/2024 in [facility] Office Visit from 06/11/2023 in [facility] Office Visit from 04/18/2022 in [facility]  Total GAD-7 Score 11 6 6 14    PHQ2-9    Flowsheet Row Office Visit from 03/18/2024 in [facility] Office Visit from 02/28/2024 in [facility] Office Visit from 06/11/2023 in [facility] Office Visit from 04/18/2022 in [facility] Office Visit from 01/17/2022 in [facility]  PHQ-2 Total Score 6 3 3 6 5   PHQ-9 Total Score 17 10 9 20 16    Flowsheet Row Admission (Discharged) from 06/12/2024 in [facility] ED from 05/30/2024 in [facility] Office Visit from 03/18/2024 in [facility]  C-SSRS RISK CATEGORY No Risk No Risk No Risk     Review of Systems:  Review of Systems  Cardiovascular:  Negative for chest pain and palpitations.  Musculoskeletal:  Positive for back pain.  Neurological:  Positive for headaches.  Psychiatric/Behavioral:  Negative for dysphoric mood and suicidal ideas. The patient is nervous/anxious.     Medications: I have reviewed the patient's current medications.  Current Outpatient Medications  Medication Sig Dispense Refill   aspirin  EC (ASPIRIN  LOW DOSE) 81 MG tablet Take 1 tablet (81 mg total) by mouth daily. 90 tablet 2   Atogepant  (QULIPTA ) 60 MG TABS 1/2 tablet po qd     cariprazine  (VRAYLAR ) 3 MG capsule Take 1 capsule (3 mg total) by mouth daily. 90 capsule 0   clopidogrel  (PLAVIX ) 75 MG tablet Take 1 tablet (75 mg total) by mouth daily. 90 tablet 1    DULoxetine  (CYMBALTA ) 60 MG capsule Take 1 capsule (60 mg total) by mouth 2 (two) times daily. 180 capsule 1   ezetimibe  (ZETIA ) 10 MG tablet Take 1 tablet (10 mg total) by mouth daily. 90 tablet 1   fexofenadine  (ALLEGRA ) 180 MG tablet Take 180 mg by mouth daily.     isosorbide  mononitrate (IMDUR ) 60 MG 24 hr tablet Take 1 tablet (60 mg total) by mouth daily. 30 tablet 2   LORazepam  (ATIVAN ) 1 MG tablet Take 1 tablet (1 mg total) by mouth every 8 (eight) hours as needed for anxiety. 15 tablet 0   meclizine  (ANTIVERT ) 25 MG tablet Take 1 tablet (25 mg total) by mouth 3 (three) times daily as needed  for dizziness. 90 tablet 2   metoprolol  succinate (TOPROL  XL) 25 MG 24 hr tablet Take 1 tablet (25 mg total) by mouth daily. 30 tablet 2   Multiple Vitamin (MULTIVITAMIN WITH MINERALS) TABS tablet Take 1 tablet by mouth daily. Men's Multivitamin     nitroGLYCERIN  (NITROSTAT ) 0.4 MG SL tablet Dissolve 1 tablet (0.4 mg total) under the tongue every 5 (five) minutes up to 3 doses as needed for chest pain. If no relief after 3 doses call 911. 25 tablet 1   ondansetron  (ZOFRAN ) 4 MG tablet Take 1 tablet (4 mg total) by mouth every 8 (eight) hours as needed for nausea or vomiting. 30 tablet 0   pantoprazole  (PROTONIX ) 40 MG tablet Take one tablet twice a day for 8 weeks and then decrease to once daily 90 tablet 3   predniSONE  (DELTASONE ) 50 MG tablet Take 1 tablet (50 mg total) by mouth daily with breakfast. 5 tablet 0   ranolazine  (RANEXA ) 1000 MG SR tablet Take 1 tablet by mouth 2 times daily. 180 tablet 3   rosuvastatin  (CRESTOR ) 40 MG tablet Take 1 tablet (40 mg total) by mouth daily. 90 tablet 1   Solriamfetol  HCl (SUNOSI ) 150 MG TABS Take 1 tablet (150 mg total) by mouth every morning. Please keep 09/23/24 appointment for further refills 90 tablet 1   Ubrogepant  (UBRELVY ) 100 MG TABS Take 1 tablet by mouth  at onset of dizziness/headache.  May repeat once in 2 hours.  Max dose 200mg  daily 90 tablet 1   No  current facility-administered medications for this visit.    Medication Side Effects: None  Allergies:  Allergies  Allergen Reactions   Penicillins Rash   Trintellix [Vortioxetine] Other (See Comments)    Headaches.    Past Medical History:  Diagnosis Date   Abnormal EKG 06/13/2023   Abnormal stress test small area of mild ischemia inferior wall 05/24/2022   Aphasia 06/13/2023   Arthropathy of lumbar facet joint 07/03/2023   Bilateral sacroiliitis 08/29/2023   Bipolar 1 disorder 02/04/2023   Chronic bilateral low back pain with right-sided sciatica 06/24/2023   Chronic headaches 01/30/2021   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   Coronary artery disease involving coronary bypass graft of native heart with angina pectoris 05/22/2016   Cath 2013 LAD 95% stenosis, D1 90% stenosis, RCA 20%, Circ 30%. Previous stent to D1 2012. EF 60%.   Degeneration of lumbar intervertebral disc 07/03/2023   Dizziness 10/27/2020   Excessive daytime sleepiness 09/11/2016   Gastroesophageal reflux disease 02/04/2023   Generalized anxiety disorder 05/22/2016   Herpes zoster 01/20/2020   History of small bowel obstruction    Admitted 10-14-2007 for partical small bowel obstruction and N.G. tube was placed. Admitted by Dr Gerard. 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   Please see review of systems for further details on the patient's review from today.   Objective:   Physical Exam:  There were no vitals taken for this visit.  Physical Exam Constitutional:      General: He is not in acute distress.    Appearance: He is well-developed.  Musculoskeletal:        General: No deformity.  Neurological:     Mental Status: He is alert and oriented to person, place, and time.     Coordination: Coordination normal.  Psychiatric:        Attention and Perception: Attention and perception normal.        Mood and Affect: Mood is anxious. Mood is not depressed. Affect is not labile, blunt, angry or inappropriate.        Speech: Speech normal.        Behavior: Behavior normal.        Thought Content: Thought content normal. Thought content is not delusional. Thought content does not include homicidal or suicidal ideation. Thought content does not include suicidal plan.        Cognition and Memory: He exhibits impaired recent memory.        Judgment: Judgment normal.     Comments: Less dep and memory concerns today But ongoing and uptick of anxiety affecting work     Lab Review:     Component Value Date/Time   NA 140 08/24/2024 0852   K 3.9 08/24/2024 0852   CL 103 08/24/2024 0852   CO2 20 08/24/2024 0852   GLUCOSE 96 08/24/2024 0852   GLUCOSE 79 06/09/2024 1444   BUN 16 08/24/2024 0852   CREATININE 1.43 (H) 08/24/2024 0852   CALCIUM  9.4 08/24/2024 0852   PROT 6.4 08/24/2024 0852   ALBUMIN  3.9 08/24/2024 0852   AST 27 08/24/2024 0852   ALT 43 08/24/2024 0852   ALKPHOS 62 08/24/2024 0852    BILITOT 0.6 08/24/2024 0852   GFRNONAA >60 06/09/2024 1444   GFRAA 77 11/15/2020 0809       Component Value Date/Time   WBC 6.0 08/24/2024 0852   WBC 6.5 06/09/2024 1444   RBC 4.59 08/24/2024 0852   RBC 3.93 (L) 06/09/2024 1444   HGB 15.5 08/24/2024 0852   HCT 46.3 08/24/2024 0852   PLT 167 08/24/2024 0852   MCV 101 (H) 08/24/2024 0852   MCH 33.8 (H) 08/24/2024 0852   MCH 33.6 06/09/2024 1444   MCHC 33.5 08/24/2024 0852   MCHC 33.8 06/09/2024 1444   RDW 12.9 08/24/2024 0852   LYMPHSABS 1.2 08/24/2024 0852   MONOABS 0.5 05/30/2024 1411   EOSABS 0.2 08/24/2024 0852   BASOSABS 0.0 08/24/2024 0852    Lithium  Lvl  Date Value Ref Range Status  12/26/2023 0.8 0.5 - 1.2 mmol/L Final    Comment:    A concentration of 0.5-0.8 mmol/L is advised for long-term use; concentrations of up to 1.2 mmol/L may be necessary during acute treatment.                                  Detection Limit = 0.1                           <  0.1 indicates None Detected    02/05/23 lithium  level 0.9 stable sine 2021  05/31/23 D 51, B12 786, TSH 3.86 08/30/23 EKG normal QTc  .res Assessment: Plan:    Jon Gonzales was seen today for follow-up, depression and anxiety.  Diagnoses and all orders for this visit:  Bipolar I disorder with depression, severe (HCC)  Generalized anxiety disorder  Panic disorder with agoraphobia   30 min appt.  Disc dx with hx cog, bipolar, anxiety issues.  Disc dx.   Pleased with Vraylar  for mood.   Wants resume duloxetine  60 BID.  Disc SE risk in combo with Sunosi  from neuro.  Call if a problem.   He is less depressed with changes but still some memory concerns and residual dep perhaps.  We discussed the short-term risks associated with benzodiazepines including sedation and increased fall risk among others.  Discussed long-term side effect risk including dependence, potential withdrawal symptoms, and the potential eventual dose-related risk of dementia.  But  recent studies from 2020 dispute this association between benzodiazepines and dementia risk. Newer studies in 2020 do not support an association with dementia. Rec try reducing AM lorazepam  to see if memory is better.  Lab workup memory px per neuro was unremarkable:  05/31/23 D 51, B12 786, TSH 3.86  Stopped lithium  no prolem off it.  Disc mania risk but Vraylar  should prevent.  Discussed potential metabolic side effects associated with atypical antipsychotics, as well as potential risk for movement side effects. Advised pt to contact office if movement side effects occur.   Continue other meds for now including Vraylar  3, duloxetine  120, llorazepam except the following:    (NAC) N-Acetylcysteine 2 of the  600 mg capsules daily to help with mild cognitive problems.   continue duloxetine  back to 60 mg BID .  Ok for additonal mood and anxiety effects.  Rec resume B12 DT hx cog complaints and DT macrocytic RBC even though last B12 level was normal.  He's taken it on and off over time.  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  Disc Sertraline  better for anxiety but he doesn't want to change so trial low dose 50 mg with duloxetine  120 mg bc he doesn't want to reduce it yet.   I've explained to him that drugs of the SSRI class can have side effects such as weight gain, sexual dysfunction, insomnia, headache, nausea. These medications are generally effective at alleviating symptoms of anxiety and/or depression. Let me know if significant side effects do occur.  Also risk execessive serotonin SE and call if it occurs.  FU 8 weeks.  Lorene Macintosh, MD, DFAPA   Please see After Visit Summary for patient specific instructions.  Future Appointments  Date Time Provider Department Center  12/09/2024 11:00 AM Bernie Lamar PARAS, MD [facility] None  12/30/2024 11:00 AM Nicholaus Credit, PA-C [facility]    No orders of the defined types were placed in this  encounter.   -------------------------------

## 2024-10-29 ENCOUNTER — Other Ambulatory Visit: Payer: Self-pay

## 2024-10-29 ENCOUNTER — Other Ambulatory Visit (HOSPITAL_BASED_OUTPATIENT_CLINIC_OR_DEPARTMENT_OTHER): Payer: Self-pay

## 2024-10-30 ENCOUNTER — Other Ambulatory Visit (HOSPITAL_BASED_OUTPATIENT_CLINIC_OR_DEPARTMENT_OTHER): Payer: Self-pay

## 2024-11-03 ENCOUNTER — Other Ambulatory Visit (HOSPITAL_COMMUNITY): Payer: Self-pay

## 2024-11-03 ENCOUNTER — Other Ambulatory Visit: Payer: Self-pay | Admitting: Physician Assistant

## 2024-11-03 ENCOUNTER — Other Ambulatory Visit: Payer: Self-pay | Admitting: Psychiatry

## 2024-11-03 ENCOUNTER — Other Ambulatory Visit: Payer: Self-pay

## 2024-11-03 DIAGNOSIS — I25118 Atherosclerotic heart disease of native coronary artery with other forms of angina pectoris: Secondary | ICD-10-CM

## 2024-11-03 DIAGNOSIS — F411 Generalized anxiety disorder: Secondary | ICD-10-CM

## 2024-11-03 MED ORDER — EZETIMIBE 10 MG PO TABS
10.0000 mg | ORAL_TABLET | Freq: Every day | ORAL | 1 refills | Status: AC
  Filled 2024-11-03: qty 30, 30d supply, fill #0

## 2024-11-03 MED ORDER — LORAZEPAM 1 MG PO TABS
1.0000 mg | ORAL_TABLET | Freq: Three times a day (TID) | ORAL | 0 refills | Status: DC | PRN
  Filled 2024-11-03: qty 30, 10d supply, fill #0

## 2024-11-04 ENCOUNTER — Other Ambulatory Visit: Payer: Self-pay

## 2024-11-04 ENCOUNTER — Other Ambulatory Visit (HOSPITAL_BASED_OUTPATIENT_CLINIC_OR_DEPARTMENT_OTHER): Payer: Self-pay

## 2024-11-04 ENCOUNTER — Other Ambulatory Visit (HOSPITAL_COMMUNITY): Payer: Self-pay

## 2024-11-04 ENCOUNTER — Telehealth: Payer: Self-pay | Admitting: Psychiatry

## 2024-11-04 ENCOUNTER — Other Ambulatory Visit: Payer: Self-pay | Admitting: Psychiatry

## 2024-11-04 DIAGNOSIS — F411 Generalized anxiety disorder: Secondary | ICD-10-CM

## 2024-11-04 MED ORDER — LORAZEPAM 1 MG PO TABS
ORAL_TABLET | ORAL | 2 refills | Status: AC
  Filled 2024-11-04: qty 90, fill #0
  Filled 2024-11-16: qty 90, 30d supply, fill #0

## 2024-11-04 NOTE — Telephone Encounter (Signed)
 Echo results read on MyChart on 11/03/24.  Alan, RN

## 2024-11-04 NOTE — Telephone Encounter (Signed)
 A new Rx for the correct dosing of alprazolam was sent today, but wife is asking for a 90-day supply. LVM to RC to pt.

## 2024-11-04 NOTE — Telephone Encounter (Signed)
 Kirsten lvm that the lorazapam script is wrong. It needs to say 2 a day plus 1 as needed. She also would like a 90 day supply as well. Also since damon has taken new medicine zoloft . Ardyth has been feeling more depressed over the last 3 days. Does he need to continue with it and give it time or stop it. Please call dam,on at [phone]

## 2024-11-06 ENCOUNTER — Other Ambulatory Visit (HOSPITAL_COMMUNITY): Payer: Self-pay

## 2024-11-06 NOTE — Telephone Encounter (Signed)
 Left second VM to RC, goes straight to VM. Sent MyChart message as well.

## 2024-11-16 ENCOUNTER — Encounter: Payer: Self-pay | Admitting: Physician Assistant

## 2024-11-17 ENCOUNTER — Other Ambulatory Visit (HOSPITAL_BASED_OUTPATIENT_CLINIC_OR_DEPARTMENT_OTHER): Payer: Self-pay

## 2024-11-17 ENCOUNTER — Other Ambulatory Visit: Payer: Self-pay

## 2024-11-17 DIAGNOSIS — G43809 Other migraine, not intractable, without status migrainosus: Secondary | ICD-10-CM

## 2024-11-17 MED ORDER — QULIPTA 60 MG PO TABS
ORAL_TABLET | ORAL | 2 refills | Status: AC
  Filled 2024-11-17: qty 30, 60d supply, fill #0

## 2024-11-18 ENCOUNTER — Telehealth: Payer: Self-pay

## 2024-11-18 NOTE — Telephone Encounter (Signed)
 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 Disc Sertraline  better for anxiety but he doesn't want to change so trial low dose 50 mg with duloxetine  120 mg bc he doesn't want to reduce it yet.   He said he does not see any difference in his anxiety. Denies SE.

## 2024-11-18 NOTE — Telephone Encounter (Signed)
 Give it 2 more weeks and if no improvement we could increase it.  It is slow to help anxiety.   I need to be careful bc also on high dose duloxetine .SABRA

## 2024-11-19 NOTE — Telephone Encounter (Signed)
 LVM per DPR with recommendations.

## 2024-12-09 ENCOUNTER — Ambulatory Visit: Admitting: Cardiology

## 2024-12-24 ENCOUNTER — Ambulatory Visit: Admitting: Psychiatry

## 2024-12-30 ENCOUNTER — Ambulatory Visit: Admitting: Physician Assistant
